# Patient Record
Sex: Male | Born: 1965
Health system: Southern US, Community
[De-identification: ages and names within clinical notes are randomized; demographics above are authoritative.]

## PROBLEM LIST (undated history)

## (undated) DIAGNOSIS — E1165 Type 2 diabetes mellitus with hyperglycemia: Secondary | ICD-10-CM

## (undated) DIAGNOSIS — F209 Schizophrenia, unspecified: Secondary | ICD-10-CM

## (undated) DIAGNOSIS — IMO0002 Reserved for concepts with insufficient information to code with codable children: Secondary | ICD-10-CM

## (undated) DIAGNOSIS — I10 Essential (primary) hypertension: Secondary | ICD-10-CM

## (undated) DIAGNOSIS — E785 Hyperlipidemia, unspecified: Secondary | ICD-10-CM

## (undated) HISTORY — DX: Schizophrenia, unspecified: F20.9

## (undated) HISTORY — DX: Essential (primary) hypertension: I10

## (undated) HISTORY — DX: Hyperlipidemia, unspecified: E78.5

## (undated) HISTORY — DX: Reserved for concepts with insufficient information to code with codable children: IMO0002

## (undated) HISTORY — DX: Type 2 diabetes mellitus with hyperglycemia: E11.65

---

## 2000-06-26 ENCOUNTER — Inpatient Hospital Stay (HOSPITAL_COMMUNITY): Admission: EM | Admit: 2000-06-26 | Discharge: 2000-07-05 | Payer: Self-pay | Admitting: *Deleted

## 2009-10-13 ENCOUNTER — Other Ambulatory Visit: Payer: Self-pay

## 2009-10-14 ENCOUNTER — Other Ambulatory Visit: Payer: Self-pay | Admitting: Emergency Medicine

## 2009-10-14 ENCOUNTER — Inpatient Hospital Stay (HOSPITAL_COMMUNITY): Admission: AD | Admit: 2009-10-14 | Discharge: 2009-10-17 | Payer: Self-pay | Admitting: Psychiatry

## 2009-10-14 ENCOUNTER — Ambulatory Visit: Payer: Self-pay | Admitting: Psychiatry

## 2009-10-14 ENCOUNTER — Other Ambulatory Visit: Payer: Self-pay

## 2010-05-29 LAB — BASIC METABOLIC PANEL
BUN: 15 mg/dL (ref 6–23)
BUN: 7 mg/dL (ref 6–23)
Creatinine, Ser: 1.84 mg/dL — ABNORMAL HIGH (ref 0.4–1.2)
GFR calc Af Amer: 36 mL/min — ABNORMAL LOW (ref >60)
GFR calc Af Amer: 37 mL/min — ABNORMAL LOW (ref >60)
GFR calc non Af Amer: 30 mL/min — ABNORMAL LOW (ref >60)
GFR calc non Af Amer: 30 mL/min — ABNORMAL LOW (ref >60)
Potassium: 4.1 mEq/L (ref 3.5–5.1)
Potassium: 3.8 mEq/L (ref 3.5–5.1)

## 2010-05-29 LAB — COMPREHENSIVE METABOLIC PANEL
ALT: 24 U/L (ref 0–35)
BUN: 14 mg/dL (ref 6–23)
CO2: 22 mEq/L (ref 19–32)
Calcium: 9.5 mg/dL (ref 8.4–10.5)
Creatinine, Ser: 1.87 mg/dL — ABNORMAL HIGH (ref 0.4–1.2)
GFR calc non Af Amer: 30 mL/min — ABNORMAL LOW (ref >60)
Glucose, Bld: 100 mg/dL — ABNORMAL HIGH (ref 70–99)
Total Protein: 6.9 g/dL (ref 6.0–8.3)

## 2010-05-29 LAB — URINALYSIS, ROUTINE W REFLEX MICROSCOPIC
Ketones, ur: NEGATIVE mg/dL
Nitrite: NEGATIVE
Protein, ur: NEGATIVE mg/dL
Urobilinogen, UA: 0.2 mg/dL (ref 0.0–1.0)

## 2010-05-29 LAB — RAPID URINE DRUG SCREEN, HOSP PERFORMED
Amphetamines: NOT DETECTED
Barbiturates: NOT DETECTED
Benzodiazepines: NOT DETECTED
Opiates: NOT DETECTED

## 2010-05-29 LAB — DIFFERENTIAL
Basophils Absolute: 0 10*3/uL (ref 0.0–0.1)
Lymphocytes Relative: 19 % (ref 12–46)
Monocytes Relative: 10 % (ref 3–12)

## 2010-05-29 LAB — CBC
HCT: 47.5 % — ABNORMAL HIGH (ref 36.0–46.0)
Platelets: 426 10*3/uL — ABNORMAL HIGH (ref 150–400)
RBC: 5.67 MIL/uL — ABNORMAL HIGH (ref 3.87–5.11)
RDW: 13.4 % (ref 11.5–15.5)
WBC: 14.3 10*3/uL — ABNORMAL HIGH (ref 4.0–10.5)

## 2010-05-29 LAB — SALICYLATE LEVEL: Salicylate Lvl: <4 mg/dL (ref 2.8–20.0)

## 2010-05-29 LAB — TSH: TSH: 0.431 u[IU]/mL (ref 0.350–4.500)

## 2010-05-29 LAB — ETHANOL: Alcohol, Ethyl (B): <5 mg/dL (ref 0–10)

## 2010-07-31 NOTE — Discharge Summary (Signed)
[facility]  Patient:    [name] [name]                 MRN: [id number] Adm. Date:  [id number] Disc. Date: 07/05/00 Attending:  Jasmine Pang CC:         [facility].   Discharge Summary  REASON FOR ADMISSION:  The patient was a 45 year old male who was committed on involuntary papers.  He has had a long history of undifferentiated schizophrenia.  He became very delusional while on a visit from his group home to his parents.  He was acting in a bizarre manner and family felt he was unable to be managed safely in a less restrictive setting.  He is treated at [facility].  For further admission information, see psychiatric admission assessment.  PHYSICAL EXAMINATION:  This was done by [facility]  The patient was found to be healthy with no significant medical problems.  ADMISSION LABORATORY DATA:  CBC was grossly within normal limits except for a slightly elevated WBC count 11.2 (4 to 10.5), slightly increased platelet count 404 (150 to 400).  Routine chemistry panel: Within normal limits. Hypothyroid panel: Within normal limits.  HOSPITAL COURSE:  Upon admission, the patient was continued on his home medications of Zoloft 50 mg q.a.m., Risperdal 3 mg p.o. b.i.d., and Neurontin 300 mg p.o. t.i.d.  On June 28, 2000, Neurontin was increased to 600 mg p.o. t.i.d.  His parents suspect that he was not being compliant with his medications prior to admission.  He states he was taking them but due to his sudden decompensation, it appears he may not have been compliant.  The patient initially stayed in bed much of the time and was delusional.  This improved as his hospitalization progressed.  He was eventually able to get out of bed and attend therapeutic groups and activities.  He visited with his family.  They were very supportive and assisted with his discharge plans.  It was decided that he would  return to the rest home where he resides.  At the time of discharge he was not psychotic.  Delusional ideation had resolved.  He was appropriate and felt able to be managed in a less restrictive setting.  DISCHARGE MENTAL STATUS EXAMINATION:  Casually dressed.  Psychomotor retardation had resolved.  Eye contact had improved.  Speech was still soft and slow.  Mood: Less depressed.  Affect: Wider range, able to smile at appropriate times though still somewhat constricted.  No suicidal or homicidal ideations, no self-injurious behavior or aggression, or psychosis or perceptual disturbance.  Delusions resolved.  Thought processes: Logical and goal directed.  Cognitive: Back to baseline.  Insight: Fair.  Judgment: Fair.  DISCHARGE DIAGNOSES: Axis I:    Schizophrenia, undifferentiated type. Axis II:   None. Axis III:  Healthy. Axis IV:   Severe. Axis V:    Global assessment of functioning current is 45, highest past year            60, admission status 30.  DISCHARGE MEDICATIONS: 1. Zoloft 50 mg q.a.m. 2. Risperdal 3 mg p.o. b.i.d. 3. Neurontin 600 mg p.o. t.i.d.  ACTIVITY LEVEL:  No restrictions.  DIET:  No restrictions.  POST HOSPITAL CARE PLAN:  Return to the rest home that he currently resides in.  Follow-up therapy and medication management at [facility] DD:  07/05/00 TD:  07/05/00 Job: [id number]

## 2010-07-31 NOTE — H&P (Signed)
[facility]  Patient:    [name] [name], [name] [name]                 MRN: [id number] Adm. Date:  [id number] Disc. Date: [id number] Attending:  Jasmine Pang CC:         [facility].   Psychiatric Admission Assessment  DATE OF ADMISSION:  June 26, 2000.  He was on the adult unit.  IDENTIFICATION:  Patient was a 45 year old African-American male admitted on involuntary basis from [facility] due to delusional ideation.  HISTORY OF PRESENT ILLNESS:  Patient has had a long history of schizophrenia, undifferentiated type.  He currently lives in a group home but had been visiting for several days with his parents.  He became very delusional while on this visit and parents noted bizarre behaviors.  He began to talk about being "Gala [place] #[phone]."  He also thought that his mother was an Hospital doctor.  They were familiar with his previous psychotic episodes and did not feel he was currently able to be managed safely in a less restrictive setting than the hospital.  PAST PSYCHIATRIC HISTORY:  Currently he is seen at the [facility].  Medications include Zoloft 50 mg q.a.m., Risperdal 3 mg p.o. b.i.d., Neurontin 300 mg p.o. t.i.d.  PAST MEDICAL HISTORY:  No acute medical problems.  No known drug allergies. He is on no other medications other than those listed above.  SUBSTANCE ABUSE HISTORY:  None.  FAMILY PSYCHIATRIC HISTORY:  None reported.  SOCIAL HISTORY:  Patient currently lives in a rest home.  He states he wants to move out but his parents are insistent that he stay in this setting for now.  When his mental status is stable, he likes the rest home, according to parents.  He is unable to work and is disabled due to his psychiatric problems.  He has no outstanding legal problems.  MENTAL STATUS EXAMINATION:  Patient was a cooperative male with poor eye contact.  He was disheveled with  psychomotor retardation.  Speech was soft and slow.  There was no articulation disorder.  Mood was depressed.  Affect constricted.  There was no suicidal or homicidal ideation.  There was positive psychosis with delusions as per history of present illness.  His thought processes were slowed.  His thought content revolved predominantly around people being imposters, including himself.  On cognitive exam, patient was alert and oriented x 4.  Short term and long term memory were adequate. General fund of knowledge were age and education level appropriately. Attention and concentration diminished.  Insight poor, judgment poor.  ADMISSION DIAGNOSES: Axis I:    Schizophrenia, undifferentiated type. Axis II:   Deferred. Axis III:  Healthy. Axis IV:   Severe. Axis V:    Global assessment of function current 30, highest past year 60.  ASSETS AND STRENGTHS:  Patient is healthy.  He has supportive parents.  He has a supportive living arrangement.  PROBLEMS:  Chronic schizophrenia with recent decompensation and psychosis.  SHORT TERM TREATMENT GOAL:  Resolution of psychosis.  LONG TERM TREATMENT GOAL:  Stabilization of schizophrenia to the point that he is able to function better in all arenas of his life.  INITIAL PLAN OF CARE:  Continue current medications.  It is unclear whether he has been compliant, although he states he has.  Unit therapeutic groups and activities.  Patients family will visit frequently to help with reality testing.  ESTIMATED LENGTH OF INPATIENT  TREATMENT:  Five to seven days.  CONDITION NECESSARY FOR DISCHARGE:  Not psychotic.  POST HOSPITAL CARE PLAN:  Return to the rest home to live.  Follow up therapy and medication management at [facility]. DD:  08/01/00 TD:  08/02/00 Job: [id number]

## 2013-03-21 ENCOUNTER — Telehealth: Payer: Self-pay

## 2013-03-21 NOTE — Telephone Encounter (Signed)
Invalid contact information. New patient

## 2013-03-22 ENCOUNTER — Encounter: Payer: Self-pay | Admitting: Family Medicine

## 2013-03-22 ENCOUNTER — Ambulatory Visit (INDEPENDENT_AMBULATORY_CARE_PROVIDER_SITE_OTHER): Payer: Medicare HMO | Admitting: Family Medicine

## 2013-03-22 VITALS — BP 118/80 | HR 83 | Temp 98.1°F | Ht 70.0 in | Wt 201.4 lb

## 2013-03-22 DIAGNOSIS — J309 Allergic rhinitis, unspecified: Secondary | ICD-10-CM

## 2013-03-22 DIAGNOSIS — Z0000 Encounter for general adult medical examination without abnormal findings: Secondary | ICD-10-CM

## 2013-03-22 DIAGNOSIS — F209 Schizophrenia, unspecified: Secondary | ICD-10-CM

## 2013-03-22 DIAGNOSIS — J302 Other seasonal allergic rhinitis: Secondary | ICD-10-CM

## 2013-03-22 DIAGNOSIS — I10 Essential (primary) hypertension: Secondary | ICD-10-CM

## 2013-03-22 LAB — CBC WITH DIFFERENTIAL/PLATELET
BASOS ABS: 0 10*3/uL (ref 0.0–0.1)
BASOS PCT: 0.4 % (ref 0.0–3.0)
Eosinophils Absolute: 0.2 10*3/uL (ref 0.0–0.7)
Eosinophils Relative: 2.1 % (ref 0.0–5.0)
HEMATOCRIT: 42.1 % (ref 39.0–52.0)
HEMOGLOBIN: 14 g/dL (ref 13.0–17.0)
LYMPHS ABS: 1.6 10*3/uL (ref 0.7–4.0)
LYMPHS PCT: 18.9 % (ref 12.0–46.0)
MCHC: 33.2 g/dL (ref 30.0–36.0)
MCV: 90.1 fl (ref 78.0–100.0)
MONOS PCT: 8 % (ref 3.0–12.0)
Monocytes Absolute: 0.7 10*3/uL (ref 0.1–1.0)
NEUTROS ABS: 6 10*3/uL (ref 1.4–7.7)
Neutrophils Relative %: 70.6 % (ref 43.0–77.0)
Platelets: 515 10*3/uL — ABNORMAL HIGH (ref 150.0–400.0)
RBC: 4.68 Mil/uL (ref 4.22–5.81)
RDW: 15.3 % — AB (ref 11.5–14.6)
WBC: 8.5 10*3/uL (ref 4.5–10.5)

## 2013-03-22 LAB — HEPATIC FUNCTION PANEL
ALBUMIN: 4.1 g/dL (ref 3.5–5.2)
ALK PHOS: 57 U/L (ref 39–117)
ALT: 31 U/L (ref 0–53)
AST: 28 U/L (ref 0–37)
Bilirubin, Direct: 0.1 mg/dL (ref 0.0–0.3)
TOTAL PROTEIN: 6.9 g/dL (ref 6.0–8.3)
Total Bilirubin: 0.5 mg/dL (ref 0.3–1.2)

## 2013-03-22 LAB — BASIC METABOLIC PANEL
BUN: 7 mg/dL (ref 6–23)
CALCIUM: 9.5 mg/dL (ref 8.4–10.5)
CO2: 24 meq/L (ref 19–32)
Chloride: 107 mEq/L (ref 96–112)
Creatinine, Ser: 1.6 mg/dL — ABNORMAL HIGH (ref 0.4–1.5)
GFR: 61.65 mL/min (ref >60.00)
GLUCOSE: 79 mg/dL (ref 70–99)
POTASSIUM: 4.3 meq/L (ref 3.5–5.1)
SODIUM: 140 meq/L (ref 135–145)

## 2013-03-22 LAB — LIPID PANEL
CHOLESTEROL: 226 mg/dL — AB (ref 0–200)
HDL: 34.8 mg/dL — ABNORMAL LOW (ref >39.00)
Total CHOL/HDL Ratio: 6
Triglycerides: 294 mg/dL — ABNORMAL HIGH (ref 0.0–149.0)
VLDL: 58.8 mg/dL — ABNORMAL HIGH (ref 0.0–40.0)

## 2013-03-22 LAB — LDL CHOLESTEROL, DIRECT: LDL DIRECT: 151.8 mg/dL

## 2013-03-22 LAB — TSH: TSH: 1.2 u[IU]/mL (ref 0.35–5.50)

## 2013-03-22 LAB — PSA: PSA: 0.63 ng/mL (ref 0.10–4.00)

## 2013-03-22 NOTE — Progress Notes (Deleted)
Subjective:    [name] Gammel. is a 48 y.o. male who presents for Medicare Annual/Subsequent preventive examination.   Preventive Screening-Counseling & Management  Tobacco History  Smoking status  . Current Every Day Smoker  . Types: Cigarettes, Cigars  Smokeless tobacco  . Not on file    Comment: cigarettes-1 pack every couple of days, cigars-pack a day    Problems Prior to Visit 1.   Current Problems (verified) Patient Active Problem List   Diagnosis Date Noted  . HTN (hypertension) 03/22/2013  . Seasonal allergies 03/22/2013    Medications Prior to Visit No current outpatient prescriptions on file prior to visit.   No current facility-administered medications on file prior to visit.    Current Medications (verified) Current Outpatient Prescriptions  Medication Sig Dispense Refill  . loratadine (CLARITIN) 10 MG tablet Take 10 mg by mouth daily.      . metoprolol tartrate (LOPRESSOR) 25 MG tablet Take 25 mg by mouth 2 (two) times daily.      . mirtazapine (REMERON) 30 MG tablet Take 30 mg by mouth at bedtime.      . risperiDONE (RISPERDAL) 2 MG tablet Take 2 mg by mouth at bedtime.      . risperidone (RISPERDAL) 4 MG tablet Take 4 mg by mouth at bedtime.       No current facility-administered medications for this visit.     Allergies (verified) Review of patient's allergies indicates not on file.   PAST HISTORY  Family History Family History  Problem Relation Age of Onset  . Breast cancer Mother   . Diabetes Mother   . Breast cancer Maternal Aunt   . Prostate cancer Father     StepFather  . Hypertension Father   . Diabetes Maternal Uncle     Social History History  Substance Use Topics  . Smoking status: Current Every Day Smoker    Types: Cigarettes, Cigars  . Smokeless tobacco: Not on file     Comment: cigarettes-1 pack every couple of days, cigars-pack a day  . Alcohol Use: Yes     Comment: Socially    Are there smokers in your home  (other than you)?  No  Risk Factors Current exercise habits: The patient does not participate in regular exercise at present.  Dietary issues discussed: na   Cardiac risk factors: hypertension, male gender and sedentary lifestyle.  Depression Screen (Note: if answer to either of the following is "Yes", a more complete depression screening is indicated)   Q1: Over the past two weeks, have you felt down, depressed or hopeless? No  Q2: Over the past two weeks, have you felt little interest or pleasure in doing things? No  Have you lost interest or pleasure in daily life? No  Do you often feel hopeless? No  Do you cry easily over simple problems? No  Activities of Daily Living In your present state of health, do you have any difficulty performing the following activities?:  Driving? No Managing money?  No Feeding yourself? No Getting from bed to chair? No Climbing a flight of stairs? No Preparing food and eating?: No Bathing or showering? No Getting dressed: No Getting to the toilet? No Using the toilet:No Moving around from place to place: No In the past year have you fallen or had a near fall?:No   Are you sexually active?  No  Do you have more than one partner?  No  Hearing Difficulties: No Do you often ask people to speak up  or repeat themselves? No Do you experience ringing or noises in your ears? No Do you have difficulty understanding soft or whispered voices? No   Do you feel that you have a problem with memory? No  Do you often misplace items? No  Do you feel safe at home?  Yes  Cognitive Testing  Alert? Yes  Normal Appearance?Yes  Oriented to person? Yes  Place? Yes   Time? Yes  Recall of three objects?  Yes  Can perform simple calculations? Yes  Displays appropriate judgment?Yes       List the Names of Other Physician/Practitioners you currently use: 1. Just moved to area. Mom has not established him with oph and dentist   Indicate any recent Medical  Services you may have received from other than [facility] providers in the past year (date may be approximate).   There is no immunization history on file for this patient.  Screening Tests Health Maintenance  Topic Date Due  . Pap Smear  11/16/1985  . Tetanus/tdap  11/17/1986  . Influenza Vaccine  10/13/2012    All answers were reviewed with the patient and necessary referrals were made:  Garnet Koyanagi, DO   03/22/2013   History reviewed:  He  has a past medical history of HTN (hypertension) and Schizophrenia. He  does not have any pertinent problems on file. He  has no past surgical history on file. His family history includes Breast cancer in his maternal aunt and mother; Diabetes in his maternal uncle and mother; Hypertension in his father; Prostate cancer in his father. He  reports that he has been smoking Cigarettes and Cigars.  He has been smoking about 0.00 packs per day. He does not have any smokeless tobacco history on file. He reports that he drinks alcohol. He reports that he does not use illicit drugs. He has a current medication list which includes the following prescription(s): loratadine, metoprolol tartrate, mirtazapine, risperidone, and risperidone. No current outpatient prescriptions on file prior to visit.   No current facility-administered medications on file prior to visit.   He has no allergies on file.  Review of Systems Review of Systems  Constitutional: Negative for activity change, appetite change and fatigue.  HENT: Negative for hearing loss, congestion, tinnitus and ear discharge.  dentist q74m Eyes: Negative for visual disturbance (see optho q1y -- vision corrected to 20/20 with glasses).  Respiratory: Negative for cough, chest tightness and shortness of breath.   Cardiovascular: Negative for chest pain, palpitations and leg swelling.  Gastrointestinal: Negative for abdominal pain, diarrhea, constipation and abdominal distention.  Genitourinary: Negative for  urgency, frequency, decreased urine volume and difficulty urinating.  Musculoskeletal: Negative for back pain, arthralgias and gait problem.  Skin: Negative for color change, pallor and rash.  Neurological: Negative for dizziness, light-headedness, numbness and headaches.  Hematological: Negative for adenopathy. Does not bruise/bleed easily.  Psychiatric/Behavioral: Negative for suicidal ideas, confusion, sleep disturbance, self-injury, dysphoric mood, decreased concentration and agitation.        Objective:     Vision by Snellen chart: opth Blood pressure 118/80, pulse 83, temperature 98.1 F (36.7 C), temperature source Oral, height 5\' 10"  (1.778 m), weight 201 lb 6.4 [name] (91.354 kg), SpO2 98.00%. Body mass index is 28.9 kg/(m^2).  BP 118/80  Pulse 83  Temp(Src) 98.1 F (36.7 C) (Oral)  Ht 5\' 10"  (1.778 m)  Wt 201 lb 6.4 [name] (91.354 kg)  BMI 28.90 kg/m2  SpO2 98% General appearance: alert, cooperative, appears stated age and no distress  Head: Normocephalic, without obvious abnormality, atraumatic Eyes: conjunctivae/corneas clear. PERRL, EOM's intact. Fundi benign. Ears: normal TM's and external ear canals both ears Nose: Nares normal. Septum midline. Mucosa normal. No drainage or sinus tenderness. Throat: lips, mucosa, and tongue normal; teeth and gums normal Neck: no adenopathy, supple, symmetrical, trachea midline and thyroid not enlarged, symmetric, no tenderness/mass/nodules Back: symmetric, no curvature. ROM normal. No CVA tenderness. Lungs: clear to auscultation bilaterally Chest wall: no tenderness Heart: regular rate and rhythm, S1, S2 normal, no murmur, click, rub or gallop Abdomen: soft, non-tender; bowel sounds normal; no masses,  no organomegaly Male genitalia: normal, penis: no lesions or discharge. testes: no masses or tenderness. no hernias Rectal: deferred Extremities: extremities normal, atraumatic, no cyanosis or edema Pulses: 2+ and symmetric Skin: Skin  color, texture, turgor normal. No rashes or lesions Lymph nodes: Cervical, supraclavicular, and axillary nodes normal. Neurologic: Alert and oriented X 3, normal strength and tone. Normal symmetric reflexes. Normal coordination and gait Psych-- + pt has autism      Assessment:     cpe      Plan:     During the course of the visit the patient was educated and counseled about appropriate screening and preventive services including:    Influenza vaccine  Td vaccine  pt mother will bring in imm records  Diet review for nutrition referral? Yes ____  Not Indicated ___x_   Patient Instructions (the written plan) was given to the patient.  Medicare Attestation I have personally reviewed: The patient's medical and social history Their use of alcohol, tobacco or illicit drugs Their current medications and supplements The patient's functional ability including ADLs,fall risks, home safety risks, cognitive, and hearing and visual impairment Diet and physical activities Evidence for depression or mood disorders  The patient's weight, height, BMI, and visual acuity have been recorded in the chart.  I have made referrals, counseling, and provided education to the patient based on review of the above and I have provided the patient with a written personalized care plan for preventive services.     Garnet Koyanagi, DO   03/22/2013

## 2013-03-22 NOTE — Progress Notes (Signed)
Subjective:    [name] [name]. is a 48 y.o. male who presents for Medicare Annual/Subsequent preventive examination.   Preventive Screening-Counseling & Management  Tobacco History  Smoking status  . Current Every Day Smoker  . Types: Cigarettes, Cigars  Smokeless tobacco  . Not on file    Comment: cigarettes-1 pack every couple of days, cigars-pack a day    Problems Prior to Visit 1.   Current Problems (verified) Patient Active Problem List   Diagnosis Date Noted  . HTN (hypertension) 03/22/2013  . Seasonal allergies 03/22/2013    Medications Prior to Visit No current outpatient prescriptions on file prior to visit.   No current facility-administered medications on file prior to visit.    Current Medications (verified) Current Outpatient Prescriptions  Medication Sig Dispense Refill  . loratadine (CLARITIN) 10 MG tablet Take 10 mg by mouth daily.      . metoprolol tartrate (LOPRESSOR) 25 MG tablet Take 25 mg by mouth 2 (two) times daily.      . mirtazapine (REMERON) 30 MG tablet Take 30 mg by mouth at bedtime.      . risperiDONE (RISPERDAL) 2 MG tablet Take 2 mg by mouth at bedtime.      . risperidone (RISPERDAL) 4 MG tablet Take 4 mg by mouth at bedtime.       No current facility-administered medications for this visit.     Allergies (verified) Review of patient's allergies indicates not on file.   PAST HISTORY  Family History Family History  Problem Relation Age of Onset  . Breast cancer Mother   . Diabetes Mother   . Breast cancer Maternal Aunt   . Prostate cancer Father     StepFather  . Hypertension Father   . Diabetes Maternal Uncle     Social History History  Substance Use Topics  . Smoking status: Current Every Day Smoker    Types: Cigarettes, Cigars  . Smokeless tobacco: Not on file     Comment: cigarettes-1 pack every couple of days, cigars-pack a day  . Alcohol Use: Yes     Comment: Socially    Are there smokers in your home  (other than you)?  No  Risk Factors Current exercise habits: The patient does not participate in regular exercise at present.  Dietary issues discussed: na   Cardiac risk factors: hypertension, male gender and sedentary lifestyle.  Depression Screen (Note: if answer to either of the following is "Yes", a more complete depression screening is indicated)   Q1: Over the past two weeks, have you felt down, depressed or hopeless? No  Q2: Over the past two weeks, have you felt little interest or pleasure in doing things? No  Have you lost interest or pleasure in daily life? No  Do you often feel hopeless? No  Do you cry easily over simple problems? No  Activities of Daily Living In your present state of health, do you have any difficulty performing the following activities?:  Driving? Yes Managing money?  Yes Feeding yourself? No Getting from bed to chair? No Climbing a flight of stairs? No Preparing food and eating?: No Bathing or showering? No Getting dressed: No Getting to the toilet? No Using the toilet:No Moving around from place to place: No In the past year have you fallen or had a near fall?:No   Are you sexually active?  No  Do you have more than one partner?  No  Hearing Difficulties: No Do you often ask people to speak up  or repeat themselves? No Do you experience ringing or noises in your ears? No Do you have difficulty understanding soft or whispered voices? No   Do you feel that you have a problem with memory? No  Do you often misplace items? No  Do you feel safe at home?  Yes  Cognitive Testing  Alert? Yes  Normal Appearance?Yes  Oriented to person? Yes  Place? Yes   Time? Yes  Recall of three objects?  Yes  Can perform simple calculations? Yes  Displays appropriate judgment?Yes  Can read the correct time from a watch face?Yes   Advanced Directives have been discussed with the patient? No   List the Names of Other Physician/Practitioners you currently  use: 1.  Psych- at assisted living  Indicate any recent Medical Services you may have received from other than [facility] providers in the past year (date may be approximate).   There is no immunization history on file for this patient.  Screening Tests Health Maintenance  Topic Date Due  . Pap Smear  11/16/1985  . Tetanus/tdap  11/17/1986  . Influenza Vaccine  10/13/2012    All answers were reviewed with the patient and necessary referrals were made:  Garnet Koyanagi, DO   03/22/2013   History reviewed:  He  has a past medical history of HTN (hypertension) and Schizophrenia. He  does not have any pertinent problems on file. He  has no past surgical history on file. His family history includes Breast cancer in his maternal aunt and mother; Diabetes in his maternal uncle and mother; Hypertension in his father; Prostate cancer in his father. He  reports that he has been smoking Cigarettes and Cigars.  He has been smoking about 0.00 packs per day. He does not have any smokeless tobacco history on file. He reports that he drinks alcohol. He reports that he does not use illicit drugs. He has a current medication list which includes the following prescription(s): loratadine, metoprolol tartrate, mirtazapine, risperidone, and risperidone. No current outpatient prescriptions on file prior to visit.   No current facility-administered medications on file prior to visit.   He has no allergies on file.  Review of Systems Review of Systems  Constitutional: Negative for activity change, appetite change and fatigue.  HENT: Negative for hearing loss, congestion, tinnitus and ear discharge.  dentist --no Eyes: Negative for visual disturbance (see optho q1y -- vision corrected to 20/20 with glasses).  Respiratory: Negative for cough, chest tightness and shortness of breath.   Cardiovascular: Negative for chest pain, palpitations and leg swelling.  Gastrointestinal: Negative for abdominal pain, diarrhea,  constipation and abdominal distention.  Genitourinary: Negative for urgency, frequency, decreased urine volume and difficulty urinating.  Musculoskeletal: Negative for back pain, arthralgias and gait problem.  Skin: Negative for color change, pallor and rash.  Neurological: Negative for dizziness, light-headedness, numbness and headaches.  Hematological: Negative for adenopathy. Does not bruise/bleed easily.  Psychiatric/Behavioral: Negative for suicidal ideas, confusion, sleep disturbance, self-injury, dysphoric mood, decreased concentration and agitation.        Objective:     Vision by Snellen chart:opth Blood pressure 118/80, pulse 83, temperature 98.1 F (36.7 C), temperature source Oral, height 5\' 10"  (1.778 m), weight 201 lb 6.4 oz (91.354 kg), SpO2 98.00%. Body mass index is 28.9 kg/(m^2).  BP 118/80  Pulse 83  Temp(Src) 98.1 F (36.7 C) (Oral)  Ht 5\' 10"  (1.778 m)  Wt 201 lb 6.4 oz (91.354 kg)  BMI 28.90 kg/m2  SpO2 98% General appearance: alert,  cooperative, appears stated age and no distress Head: Normocephalic, without obvious abnormality, atraumatic Eyes: conjunctivae/corneas clear. PERRL, EOM's intact. Fundi benign. Ears: normal TM's and external ear canals both ears Nose: Nares normal. Septum midline. Mucosa normal. No drainage or sinus tenderness. Throat: lips, mucosa, and tongue normal; teeth and gums normal Neck: no adenopathy, no carotid bruit, no JVD, supple, symmetrical, trachea midline and thyroid not enlarged, symmetric, no tenderness/mass/nodules Back: symmetric, no curvature. ROM normal. No CVA tenderness. Lungs: clear to auscultation bilaterally Chest wall: no tenderness Heart: regular rate and rhythm, S1, S2 normal, no murmur, click, rub or gallop Abdomen: soft, non-tender; bowel sounds normal; no masses,  no organomegaly Male genitalia: normal Rectal: normal tone, normal prostate, no masses or tenderness Extremities: extremities normal,  atraumatic, no cyanosis or edema Pulses: 2+ and symmetric Skin: Skin color, texture, turgor normal. No rashes or lesions Lymph nodes: Cervical, supraclavicular, and axillary nodes normal. Neurologic: Grossly normal-- Psych-- flat affect, hx schizophrenia      Assessment:     cpe      Plan:     During the course of the visit the patient was educated and counseled about appropriate screening and preventive services including:    Influenza vaccine  Td vaccine  Prostate cancer screening  Diabetes screening  Smoking cessation counseling  Diet review for nutrition referral? Yes ____  Not Indicated __x__   Patient Instructions (the written plan) was given to the patient.  Medicare Attestation I have personally reviewed: The patient's medical and social history Their use of alcohol, tobacco or illicit drugs Their current medications and supplements The patient's functional ability including ADLs,fall risks, home safety risks, cognitive, and hearing and visual impairment Diet and physical activities Evidence for depression or mood disorders  The patient's weight, height, BMI, and visual acuity have been recorded in the chart.  I have made referrals, counseling, and provided education to the patient based on review of the above and I have provided the patient with a written personalized care plan for preventive services.     Garnet Koyanagi, DO   03/22/2013

## 2013-03-22 NOTE — Addendum Note (Signed)
Addended by: Rosalita Chessman on: 03/22/2013 04:51 PM   Modules accepted: Miquel Dunn

## 2013-03-22 NOTE — Progress Notes (Deleted)
   Subjective:    Patient ID: [name] [name]., male    DOB: 1966/03/03, 48 y.o.   MRN: [id number]  HPI Pt here for cpe and labs.  Pt has no complaints.  He lives in [facility] facility and sees the psychiatrist there.   Review of Systems Review of Systems  Constitutional: Negative for activity change, appetite change and fatigue.  HENT: Negative for hearing loss, congestion, tinnitus and ear discharge.  dentist --no Eyes: Negative for visual disturbance (see optho q1y -- vision corrected to 20/20 with glasses).  Respiratory: Negative for cough, chest tightness and shortness of breath.   Cardiovascular: Negative for chest pain, palpitations and leg swelling.  Gastrointestinal: Negative for abdominal pain, diarrhea, constipation and abdominal distention.  Genitourinary: Negative for urgency, frequency, decreased urine volume and difficulty urinating.  Musculoskeletal: Negative for back pain, arthralgias and gait problem.  Skin: Negative for color change, pallor and rash.  Neurological: Negative for dizziness, light-headedness, numbness and headaches.  Hematological: Negative for adenopathy. Does not bruise/bleed easily.  Psychiatric/Behavioral: Negative for suicidal ideas, confusion, sleep disturbance, self-injury, dysphoric mood, decreased concentration and agitation. ---+ under tx for schizophrenia  Past Medical History  Diagnosis Date  . HTN (hypertension)   . Schizophrenia    History   Social History  . Marital Status: Single    Spouse Name: N/A    Number of Children: N/A  . Years of Education: N/A   Occupational History  . Not on file.   Social History Main Topics  . Smoking status: Current Every Day Smoker    Types: Cigarettes, Cigars  . Smokeless tobacco: Not on file     Comment: cigarettes-1 pack every couple of days, cigars-pack a day  . Alcohol Use: Yes     Comment: Socially  . Drug Use: No  . Sexual Activity: No   Other Topics Concern  .  Not on file   Social History Narrative  . No narrative on file   Family History  Problem Relation Age of Onset  . Breast cancer Mother   . Diabetes Mother   . Breast cancer Maternal Aunt   . Prostate cancer Father     StepFather  . Diabetes Maternal Uncle    Current outpatient prescriptions:loratadine (CLARITIN) 10 MG tablet, Take 10 mg by mouth daily., Disp: , Rfl: ;  metoprolol tartrate (LOPRESSOR) 25 MG tablet, Take 25 mg by mouth 2 (two) times daily., Disp: , Rfl: ;  mirtazapine (REMERON) 30 MG tablet, Take 30 mg by mouth at bedtime., Disp: , Rfl: ;  risperiDONE (RISPERDAL) 2 MG tablet, Take 2 mg by mouth at bedtime., Disp: , Rfl:  risperidone (RISPERDAL) 4 MG tablet, Take 4 mg by mouth at bedtime., Disp: , Rfl:         Objective:   Physical Exam        Assessment & Plan:

## 2013-03-22 NOTE — Progress Notes (Signed)
Pre visit review using our clinic review tool, if applicable. No additional management support is needed unless otherwise documented below in the visit note. 

## 2013-03-22 NOTE — Assessment & Plan Note (Signed)
Stable , con't meds  

## 2013-03-22 NOTE — Patient Instructions (Signed)
Preventive Care for Adults, Male A healthy lifestyle and preventive care can promote health and wellness. Preventive health guidelines for men include the following key practices:  A routine yearly physical is a good way to check with your caregiver about your health and preventative screening. It is a chance to share any concerns and updates on your health, and to receive a thorough exam.  Visit your dentist for a routine exam and preventative care every 6 months. Brush your teeth twice a day and floss once a day. Good oral hygiene prevents tooth decay and gum disease.  The frequency of eye exams is based on your age, health, family medical history, use of contact lenses, and other factors. Follow your caregiver's recommendations for frequency of eye exams.  Eat a healthy diet. Foods like vegetables, fruits, whole grains, low-fat dairy products, and lean protein foods contain the nutrients you need without too many calories. Decrease your intake of foods high in solid fats, added sugars, and salt. Eat the right amount of calories for you.Get information about a proper diet from your caregiver, if necessary.  Regular physical exercise is one of the most important things you can do for your health. Most adults should get at least 150 minutes of moderate-intensity exercise (any activity that increases your heart rate and causes you to sweat) each week. In addition, most adults need muscle-strengthening exercises on 2 or more days a week.  Maintain a healthy weight. The body mass index (BMI) is a screening tool to identify possible weight problems. It provides an estimate of body fat based on height and weight. Your caregiver can help determine your BMI, and can help you achieve or maintain a healthy weight.For adults 20 years and older:  A BMI below 18.5 is considered underweight.  A BMI of 18.5 to 24.9 is normal.  A BMI of 25 to 29.9 is considered overweight.  A BMI of 30 and above is  considered obese.  Maintain normal blood lipids and cholesterol levels by exercising and minimizing your intake of saturated fat. Eat a balanced diet with plenty of fruit and vegetables. Blood tests for lipids and cholesterol should begin at age 75 and be repeated every 5 years. If your lipid or cholesterol levels are high, you are over 50, or you are a high risk for heart disease, you may need your cholesterol levels checked more frequently.Ongoing high lipid and cholesterol levels should be treated with medicines if diet and exercise are not effective.  If you smoke, find out from your caregiver how to quit. If you do not use tobacco, do not start.  Lung cancer screening is recommended for adults aged 85 80 years who are at high risk for developing lung cancer because of a history of smoking. Yearly low-dose computed tomography (CT) is recommended for people who have at least a 30-pack-year history of smoking and are a current smoker or have quit within the past 15 years. A pack year of smoking is smoking an average of 1 pack of cigarettes a day for 1 year (for example: 1 pack a day for 30 years or 2 packs a day for 15 years). Yearly screening should continue until the smoker has stopped smoking for at least 15 years. Yearly screening should also be stopped for people who develop a health problem that would prevent them from having lung cancer treatment.  If you choose to drink alcohol, do not exceed 2 drinks per day. One drink is considered to be 12 ounces (  355 mL) of beer, 5 ounces (148 mL) of wine, or 1.5 ounces (44 mL) of liquor.  Avoid use of street drugs. Do not share needles with anyone. Ask for help if you need support or instructions about stopping the use of drugs.  High blood pressure causes heart disease and increases the risk of stroke. Your blood pressure should be checked at least every 1 to 2 years. Ongoing high blood pressure should be treated with medicines, if weight loss and  exercise are not effective.  If you are 49 to 46 years old, ask your caregiver if you should take aspirin to prevent heart disease.  Diabetes screening involves taking a blood sample to check your fasting blood sugar level. This should be done once every 3 years, after age 67, if you are within normal weight and without risk factors for diabetes. Testing should be considered at a younger age or be carried out more frequently if you are overweight and have at least 1 risk factor for diabetes.  Colorectal cancer can be detected and often prevented. Most routine colorectal cancer screening begins at the age of 72 and continues through age 25. However, your caregiver may recommend screening at an earlier age if you have risk factors for colon cancer. On a yearly basis, your caregiver may provide home test kits to check for hidden blood in the stool. Use of a small camera at the end of a tube, to directly examine the colon (sigmoidoscopy or colonoscopy), can detect the earliest forms of colorectal cancer. Talk to your caregiver about this at age 23, when routine screening begins. Direct examination of the colon should be repeated every 5 to 10 years through age 10, unless early forms of pre-cancerous polyps or small growths are found.  Hepatitis C blood testing is recommended for all people born from [birth year] through 1965 and any individual with known risks for hepatitis C.  Practice safe sex. Use condoms and avoid high-risk sexual practices to reduce the spread of sexually transmitted infections (STIs). STIs include gonorrhea, chlamydia, syphilis, trichomonas, herpes, HPV, and human immunodeficiency virus (HIV). Herpes, HIV, and HPV are viral illnesses that have no cure. They can result in disability, cancer, and death.  A one-time screening for abdominal aortic aneurysm (AAA) and surgical repair of large AAAs by sound wave imaging (ultrasonography) is recommended for ages 67 to 66 years who are current or  former smokers.  Healthy men should no longer receive prostate-specific antigen (PSA) blood tests as part of routine cancer screening. Consult with your caregiver about prostate cancer screening.  Testicular cancer screening is not recommended for adult males who have no symptoms. Screening includes self-exam, caregiver exam, and other screening tests. Consult with your caregiver about any symptoms you have or any concerns you have about testicular cancer.  Use sunscreen. Apply sunscreen liberally and repeatedly throughout the day. You should seek shade when your shadow is shorter than you. Protect yourself by wearing long sleeves, pants, a wide-brimmed hat, and sunglasses year round, whenever you are outdoors.  Once a month, do a whole body skin exam, using a mirror to look at the skin on your back. Notify your caregiver of new moles, moles that have irregular borders, moles that are larger than a pencil eraser, or moles that have changed in shape or color.  Stay current with required immunizations.  Influenza vaccine. All adults should be immunized every year.  Tetanus, diphtheria, and acellular pertussis (Td, Tdap) vaccine. An adult who has not previously  with required immunizations.  · Influenza vaccine. All adults should be immunized every year.  · Tetanus, diphtheria, and acellular pertussis (Td, Tdap) vaccine. An adult who has not previously received Tdap or who does not know his vaccine status should receive 1 dose of Tdap. This initial dose should be followed by tetanus and diphtheria toxoids (Td) booster doses every 10 years. Adults with an unknown or incomplete history of completing a 3-dose immunization series with Td-containing vaccines should begin or complete a primary immunization series including a Tdap dose. Adults should receive a Td booster every 10 years.  · Varicella vaccine. An adult without evidence of immunity to varicella should receive 2 doses or a second dose if he has previously received 1 dose.  · Human papillomavirus (HPV) vaccine. Males aged 13 21 years who have not received the vaccine previously should receive the 3-dose series. Males aged 22 26 years may be  immunized. Immunization is recommended through the age of 26 years for any male who has sex with males and did not get any or all doses earlier. Immunization is recommended for any person with an immunocompromised condition through the age of 26 years if he did not get any or all doses earlier. During the 3-dose series, the second dose should be obtained 4 8 weeks after the first dose. The third dose should be obtained 24 weeks after the first dose and 16 weeks after the second dose.  · Zoster vaccine. One dose is recommended for adults aged 60 years or older unless certain conditions are present.  · Measles, mumps, and rubella (MMR) vaccine. Adults born before 1957 generally are considered immune to measles and mumps. Adults born in 1957 or later should have 1 or more doses of MMR vaccine unless there is a contraindication to the vaccine or there is laboratory evidence of immunity to each of the three diseases. A routine second dose of MMR vaccine should be obtained at least 28 days after the first dose for students attending postsecondary schools, health care workers, or international travelers. People who received inactivated measles vaccine or an unknown type of measles vaccine during 1963 1967 should receive 2 doses of MMR vaccine. People who received inactivated mumps vaccine or an unknown type of mumps vaccine before 1979 and are at high risk for mumps infection should consider immunization with 2 doses of MMR vaccine. Unvaccinated health care workers born before 1957 who lack laboratory evidence of measles, mumps, or rubella immunity or laboratory confirmation of disease should consider measles and mumps immunization with 2 doses of MMR vaccine or rubella immunization with 1 dose of MMR vaccine.  · Pneumococcal 13-valent conjugate (PCV13) vaccine. When indicated, a person who is uncertain of his immunization history and has no record of immunization should receive the PCV13 vaccine. An adult aged 19 years or  older who has certain medical conditions and has not been previously immunized should receive 1 dose of PCV13 vaccine. This PCV13 should be followed with a dose of pneumococcal polysaccharide (PPSV23) vaccine. The PPSV23 vaccine dose should be obtained at least 8 weeks after the dose of PCV13 vaccine. An adult aged 19 years or older who has certain medical conditions and previously received 1 or more doses of PPSV23 vaccine should receive 1 dose of PCV13. The PCV13 vaccine dose should be obtained 1 or more years after the last PPSV23 vaccine dose.  · Pneumococcal polysaccharide (PPSV23) vaccine. When PCV13 is also indicated, PCV13 should be obtained first. All adults aged 65   years and older should be immunized. An adult younger than age 65 years who has certain medical conditions should be immunized. Any person who resides in a nursing home or long-term care facility should be immunized. An adult smoker should be immunized. People with an immunocompromised condition and certain other conditions should receive both PCV13 and PPSV23 vaccines. People with human immunodeficiency virus (HIV) infection should be immunized as soon as possible after diagnosis. Immunization during chemotherapy or radiation therapy should be avoided. Routine use of PPSV23 vaccine is not recommended for American Indians, [place] Natives, or people younger than 65 years unless there are medical conditions that require PPSV23 vaccine. When indicated, people who have unknown immunization and have no record of immunization should receive PPSV23 vaccine. One-time revaccination 5 years after the first dose of PPSV23 is recommended for people aged 19 64 years who have chronic kidney failure, nephrotic syndrome, asplenia, or immunocompromised conditions. People who received 1 2 doses of PPSV23 before age 65 years should receive another dose of PPSV23 vaccine at age 65 years or later if at least 5 years have passed since the previous dose. Doses of  PPSV23 are not needed for people immunized with PPSV23 at or after age 65 years.  · Meningococcal vaccine. Adults with asplenia or persistent complement component deficiencies should receive 2 doses of quadrivalent meningococcal conjugate (MenACWY-D) vaccine. The doses should be obtained at least 2 months apart. Microbiologists working with certain meningococcal bacteria, military recruits, people at risk during an outbreak, and people who travel to or live in countries with a high rate of meningitis should be immunized. A first-year college student up through age 21 years who is living in a residence hall should receive a dose if he did not receive a dose on or after his 16th birthday. Adults who have certain high-risk conditions should receive one or more doses of vaccine.  · Hepatitis A vaccine. Adults who wish to be protected from this disease, have certain high-risk conditions, work with hepatitis A-infected animals, work in hepatitis A research labs, or travel to or work in countries with a high rate of hepatitis A should be immunized. Adults who were previously unvaccinated and who anticipate close contact with an international adoptee during the first 60 days after arrival in the [place] from a country with a high rate of hepatitis A should be immunized.  · Hepatitis B vaccine. Adults who wish to be protected from this disease, have certain high-risk conditions, may be exposed to blood or other infectious body fluids, are household contacts or sex partners of hepatitis B positive people, are clients or workers in certain care facilities, or travel to or work in countries with a high rate of hepatitis B should be immunized.  · Haemophilus influenzae type b (Hib) vaccine. A previously unvaccinated person with asplenia or sickle cell disease or having a scheduled splenectomy should receive 1 dose of Hib vaccine. Regardless of previous immunization, a recipient of a hematopoietic stem cell transplant  should receive a 3-dose series 6 12 months after his successful transplant. Hib vaccine is not recommended for adults with HIV infection.  Preventive Service / Frequency  Ages 19 to 39  · Blood pressure check.** / Every 1 to 2 years.  · Lipid and cholesterol check.** / Every 5 years beginning at age 20.  · Hepatitis C blood test.** / For any individual with known risks for hepatitis C.  · Skin self-exam. / Monthly.  · Influenza vaccine. / Every year.  ·   need at least 1 dose of MMR if you were born in 1957 or later. You may also need a 2nd dose.  Pneumococcal 13-valent conjugate (PCV13) vaccine.** / Consult your caregiver.  Pneumococcal polysaccharide (PPSV23) vaccine.** / 1 to 2 doses if you smoke cigarettes or if you have certain conditions.  Meningococcal vaccine.** / 1 dose if you are age 30 to 15 years and a Market researcher living in a residence hall, or have one of several medical conditions, you need to get vaccinated against meningococcal disease. You may also need additional booster doses.  Hepatitis A vaccine.** / Consult your caregiver.  Hepatitis B vaccine.** / Consult your caregiver.  Haemophilus influenzae type b (Hib) vaccine.** / Consult your caregiver. Ages 36 to 12  Blood pressure check.** / Every 1 to 2 years.  Lipid and cholesterol check.** / Every 5 years beginning at age 71.  Lung cancer screening. / Every year if you are aged 55 80 years and have a 30-pack-year history of smoking and currently smoke or have quit within the past 15 years. Yearly screening is stopped once you have quit smoking for at least 15 years or develop a health problem that would prevent you from having lung cancer  treatment.  Fecal occult blood test (FOBT) of stool. / Every year beginning at age 75 and continuing until age 60. You may not have to do this test if you get colonoscopy every 10 years.  Flexible sigmoidoscopy** or colonoscopy.** / Every 5 years for a flexible sigmoidoscopy or every 10 years for a colonoscopy beginning at age 18 and continuing until age 14.  Hepatitis C blood test.** / For all people born from [birth year] through 1963 and any individual with known risks for hepatitis C.  Skin self-exam. / Monthly.  Influenza vaccine. / Every year.  Tetanus, diphtheria, and acellular pertussis (Tdap/Td) vaccine.** / Consult your caregiver. 1 dose of Td every 10 years.  Varicella vaccine.** / Consult your caregiver.  Zoster vaccine.** / 1 dose for adults aged 63 years or older.  Measles, mumps, rubella (MMR) vaccine.** / You need at least 1 dose of MMR if you were born in 1957 or later. You may also need a 2nd dose.  Pneumococcal 13-valent conjugate (PCV13) vaccine.** / Consult your caregiver.  Pneumococcal polysaccharide (PPSV23) vaccine.** / 1 to 2 doses if you smoke cigarettes or if you have certain conditions.  Meningococcal vaccine.** / Consult your caregiver.  Hepatitis A vaccine.** / Consult your caregiver.  Hepatitis B vaccine.** / Consult your caregiver.  Haemophilus influenzae type b (Hib) vaccine.** / Consult your caregiver. Ages 94 and over  Blood pressure check.** / Every 1 to 2 years.  Lipid and cholesterol check.**/ Every 5 years beginning at age 69.  Lung cancer screening. / Every year if you are aged 37 80 years and have a 30-pack-year history of smoking and currently smoke or have quit within the past 15 years. Yearly screening is stopped once you have quit smoking for at least 15 years or develop a health problem that would prevent you from having lung cancer treatment.  Fecal occult blood test (FOBT) of stool. / Every year beginning at age 73 and continuing until  age 56. You may not have to do this test if you get colonoscopy every 10 years.  Flexible sigmoidoscopy** or colonoscopy.** / Every 5 years for a flexible sigmoidoscopy or every 10 years for a colonoscopy beginning at age 40 and continuing until age 43.  Hepatitis C blood test.** /  For all people born from [birth year] through 1963 and any individual with known risks for hepatitis C.  Abdominal aortic aneurysm (AAA) screening.** / A one-time screening for ages 68 to 33 years who are current or former smokers.  Skin self-exam. / Monthly.  Influenza vaccine. / Every year.  Tetanus, diphtheria, and acellular pertussis (Tdap/Td) vaccine.** / 1 dose of Td every 10 years.  Varicella vaccine.** / Consult your caregiver.  Zoster vaccine.** / 1 dose for adults aged 61 years or older.  Pneumococcal 13-valent conjugate (PCV13) vaccine.** / Consult your caregiver.  Pneumococcal polysaccharide (PPSV23) vaccine.** / 1 dose for all adults aged 20 years and older.  Meningococcal vaccine.** / Consult your caregiver.  Hepatitis A vaccine.** / Consult your caregiver.  Hepatitis B vaccine.** / Consult your caregiver.  Haemophilus influenzae type b (Hib) vaccine.** / Consult your caregiver. **Family history and personal history of risk and conditions may change your caregiver's recommendations. Document Released: 04/27/2001 Document Revised: 06/26/2012 Document Reviewed: 07/27/2010 [facility] Patient Information 2014 [place], [place].

## 2013-03-23 ENCOUNTER — Telehealth: Payer: Self-pay | Admitting: Family Medicine

## 2013-03-23 MED ORDER — ATORVASTATIN CALCIUM 20 MG PO TABS: 20.0000 mg | ORAL_TABLET | Freq: Every day | ORAL | Status: DC

## 2013-03-23 NOTE — Telephone Encounter (Signed)
Relevant patient education assigned to patient using Emmi. ° °

## 2013-10-02 ENCOUNTER — Other Ambulatory Visit: Payer: Self-pay | Admitting: *Deleted

## 2013-10-02 MED ORDER — ATORVASTATIN CALCIUM 20 MG PO TABS: ORAL_TABLET | ORAL | Status: DC

## 2013-10-02 NOTE — Telephone Encounter (Addendum)
Called pharmacy and a new rx had been sent to them by a Dr. Alfonse Spruce for Lipitor 10mg  on (09/27/13). The new rx was not sent to the pharmacy. //AB/CMA

## 2013-10-02 NOTE — Addendum Note (Signed)
Addended by: Harl Bowie on: 10/02/2013 03:36 PM   Modules accepted: Orders

## 2014-04-20 ENCOUNTER — Encounter (HOSPITAL_COMMUNITY): Payer: Self-pay | Admitting: Emergency Medicine

## 2014-04-20 ENCOUNTER — Emergency Department (HOSPITAL_COMMUNITY)
Admission: EM | Admit: 2014-04-20 | Discharge: 2014-04-20 | Disposition: A | Discharge status: Home / Self Care | Payer: Commercial Managed Care - HMO | Attending: Emergency Medicine | Admitting: Emergency Medicine

## 2014-04-20 DIAGNOSIS — S199XXA Unspecified injury of neck, initial encounter: Secondary | ICD-10-CM | POA: Insufficient documentation

## 2014-04-20 DIAGNOSIS — S8992XA Unspecified injury of left lower leg, initial encounter: Secondary | ICD-10-CM | POA: Diagnosis present

## 2014-04-20 DIAGNOSIS — S3992XA Unspecified injury of lower back, initial encounter: Secondary | ICD-10-CM | POA: Insufficient documentation

## 2014-04-20 DIAGNOSIS — S80212A Abrasion, left knee, initial encounter: Secondary | ICD-10-CM | POA: Insufficient documentation

## 2014-04-20 DIAGNOSIS — F209 Schizophrenia, unspecified: Secondary | ICD-10-CM | POA: Diagnosis not present

## 2014-04-20 DIAGNOSIS — Y9389 Activity, other specified: Secondary | ICD-10-CM | POA: Insufficient documentation

## 2014-04-20 DIAGNOSIS — R000 Tachycardia, unspecified: Secondary | ICD-10-CM | POA: Diagnosis not present

## 2014-04-20 DIAGNOSIS — H1133 Conjunctival hemorrhage, bilateral: Secondary | ICD-10-CM | POA: Diagnosis not present

## 2014-04-20 DIAGNOSIS — Z720 Tobacco use: Secondary | ICD-10-CM | POA: Diagnosis not present

## 2014-04-20 DIAGNOSIS — I10 Essential (primary) hypertension: Secondary | ICD-10-CM | POA: Insufficient documentation

## 2014-04-20 DIAGNOSIS — Y92410 Unspecified street and highway as the place of occurrence of the external cause: Secondary | ICD-10-CM | POA: Insufficient documentation

## 2014-04-20 DIAGNOSIS — Z79899 Other long term (current) drug therapy: Secondary | ICD-10-CM | POA: Insufficient documentation

## 2014-04-20 DIAGNOSIS — Y998 Other external cause status: Secondary | ICD-10-CM | POA: Diagnosis not present

## 2014-04-20 MED ORDER — HYDROCODONE-ACETAMINOPHEN 5-325 MG PO TABS
2.0000 | ORAL_TABLET | Freq: Once | ORAL | Status: DC
  Filled 2014-04-20: qty 2

## 2014-04-20 MED ORDER — HYDROCODONE-ACETAMINOPHEN 5-325 MG PO TABS: 1.0000 | ORAL_TABLET | ORAL | Status: DC | PRN

## 2014-04-20 NOTE — ED Notes (Signed)
Pt ambulated in hall with steady gait.

## 2014-04-20 NOTE — ED Notes (Signed)
C collar removed by Herbie [place] browning, [place]
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
## 2014-04-20 NOTE — Discharge Instructions (Signed)

## 2014-04-20 NOTE — ED Notes (Signed)
Pt removed off of backboard. Dejnies any pain to neck or back

## 2014-08-14 ENCOUNTER — Telehealth: Payer: Self-pay | Admitting: Family Medicine

## 2014-08-14 NOTE — Telephone Encounter (Signed)
Relation to pt: self Call back number: [phone] Pharmacy: [facility], [place] [phone] (Phone) [phone] (Fax         Reason for call:  metoprolol tartrate (LOPRESSOR) 25 MG tablet  loratadine (CLARITIN) 10 MG tablet  risperiDONE (RISPERDAL) 2 MG tablet  risperidone (RISPERDAL) 4 MG tablet [

## 2014-08-15 MED ORDER — METOPROLOL TARTRATE 25 MG PO TABS: 25.0000 mg | ORAL_TABLET | Freq: Two times a day (BID) | ORAL | Status: DC

## 2014-08-15 MED ORDER — RISPERIDONE 4 MG PO TABS: 4.0000 mg | ORAL_TABLET | Freq: Every day | ORAL | Status: DC

## 2014-08-15 MED ORDER — LORATADINE 10 MG PO TABS: 10.0000 mg | ORAL_TABLET | Freq: Every day | ORAL | Status: DC | PRN

## 2014-08-15 MED ORDER — RISPERIDONE 2 MG PO TABS: 2.0000 mg | ORAL_TABLET | Freq: Every day | ORAL | Status: DC

## 2014-08-15 NOTE — Telephone Encounter (Signed)
One month with 1 refill only--  Must keep appointment

## 2014-08-15 NOTE — Telephone Encounter (Signed)
Rx faxed.    KP 

## 2014-08-15 NOTE — Telephone Encounter (Signed)
MD has not filled these med's and the patient hs not been seen since 03/22/2013, he has a pending apt on 09/20/14. Please advise if this refill is appropriate.    KP

## 2014-08-15 NOTE — Telephone Encounter (Signed)
[facility] [address]

## 2014-08-15 NOTE — Addendum Note (Signed)
Addended by: Ewing Schlein on: 08/15/2014 05:11 PM   Modules accepted: Orders

## 2014-08-15 NOTE — Telephone Encounter (Signed)
Rx re-faxed    KP 

## 2014-09-17 MED ORDER — METOPROLOL TARTRATE 25 MG PO TABS: 25.0000 mg | ORAL_TABLET | Freq: Two times a day (BID) | ORAL | Status: DC

## 2014-09-17 MED ORDER — LORATADINE 10 MG PO TABS: 10.0000 mg | ORAL_TABLET | Freq: Every day | ORAL | Status: DC | PRN

## 2014-09-17 MED ORDER — RISPERIDONE 2 MG PO TABS: 2.0000 mg | ORAL_TABLET | Freq: Every day | ORAL | Status: DC

## 2014-09-17 MED ORDER — RISPERIDONE 4 MG PO TABS: 4.0000 mg | ORAL_TABLET | Freq: Every day | ORAL | Status: DC

## 2014-09-17 NOTE — Addendum Note (Signed)
Addended by: Reino Bellis on: 09/17/2014 11:59 AM   Modules accepted: Orders, Medications

## 2014-09-17 NOTE — Telephone Encounter (Signed)
Sent enough pills to cover patient till Appt.

## 2014-09-17 NOTE — Telephone Encounter (Addendum)
Caller name: Richeson,Gwendolyn Relation to pt: mother  Call back number: [phone] Pharmacy:  [facility], [place] [phone] (Phone) [phone] (Fax)         Reason for call:  Mother requesting a few pills to hold pt over until 09/20/14 appointment. Pt is completely out.

## 2014-09-20 ENCOUNTER — Ambulatory Visit: Payer: Medicare HMO | Admitting: Family Medicine

## 2014-09-21 ENCOUNTER — Other Ambulatory Visit: Payer: Self-pay | Admitting: Family Medicine

## 2014-09-23 ENCOUNTER — Ambulatory Visit (INDEPENDENT_AMBULATORY_CARE_PROVIDER_SITE_OTHER): Payer: Medicare HMO | Admitting: Family Medicine

## 2014-09-23 ENCOUNTER — Encounter: Payer: Self-pay | Admitting: Family Medicine

## 2014-09-23 VITALS — BP 128/80 | HR 82 | Temp 97.6°F | Resp 18 | Ht 71.5 in | Wt 192.6 lb

## 2014-09-23 DIAGNOSIS — F209 Schizophrenia, unspecified: Secondary | ICD-10-CM

## 2014-09-23 DIAGNOSIS — J302 Other seasonal allergic rhinitis: Secondary | ICD-10-CM

## 2014-09-23 DIAGNOSIS — N289 Disorder of kidney and ureter, unspecified: Secondary | ICD-10-CM

## 2014-09-23 DIAGNOSIS — E785 Hyperlipidemia, unspecified: Secondary | ICD-10-CM | POA: Diagnosis not present

## 2014-09-23 DIAGNOSIS — I10 Essential (primary) hypertension: Secondary | ICD-10-CM

## 2014-09-23 LAB — CBC WITH DIFFERENTIAL/PLATELET
BASOS ABS: 0 10*3/uL (ref 0.0–0.1)
BASOS PCT: 0.4 % (ref 0.0–3.0)
EOS ABS: 0.2 10*3/uL (ref 0.0–0.7)
Eosinophils Relative: 1.5 % (ref 0.0–5.0)
HCT: 46.8 % (ref 39.0–52.0)
Hemoglobin: 15.6 g/dL (ref 13.0–17.0)
Lymphocytes Relative: 14.4 % (ref 12.0–46.0)
Lymphs Abs: 1.5 10*3/uL (ref 0.7–4.0)
MCHC: 33.2 g/dL (ref 30.0–36.0)
MCV: 90.1 fl (ref 78.0–100.0)
MONO ABS: 0.7 10*3/uL (ref 0.1–1.0)
MONOS PCT: 6.9 % (ref 3.0–12.0)
Neutro Abs: 8.1 10*3/uL — ABNORMAL HIGH (ref 1.4–7.7)
Neutrophils Relative %: 76.8 % (ref 43.0–77.0)
PLATELETS: 519 10*3/uL — AB (ref 150.0–400.0)
RBC: 5.2 Mil/uL (ref 4.22–5.81)
RDW: 14.3 % (ref 11.5–15.5)
WBC: 10.5 10*3/uL (ref 4.0–10.5)

## 2014-09-23 LAB — BASIC METABOLIC PANEL
BUN: 6 mg/dL (ref 6–23)
CHLORIDE: 106 meq/L (ref 96–112)
CO2: 24 mEq/L (ref 19–32)
Calcium: 9.8 mg/dL (ref 8.4–10.5)
Creatinine, Ser: 1.47 mg/dL (ref 0.40–1.50)
GFR: 66.08 mL/min (ref >60.00)
Glucose, Bld: 89 mg/dL (ref 70–99)
POTASSIUM: 4.2 meq/L (ref 3.5–5.1)
SODIUM: 137 meq/L (ref 135–145)

## 2014-09-23 LAB — LIPID PANEL
CHOL/HDL RATIO: 6
CHOLESTEROL: 193 mg/dL (ref 0–200)
HDL: 30.7 mg/dL — ABNORMAL LOW (ref >39.00)
NonHDL: 162.3
Triglycerides: 287 mg/dL — ABNORMAL HIGH (ref 0.0–149.0)
VLDL: 57.4 mg/dL — ABNORMAL HIGH (ref 0.0–40.0)

## 2014-09-23 LAB — HEPATIC FUNCTION PANEL
ALT: 30 U/L (ref 0–53)
AST: 21 U/L (ref 0–37)
Albumin: 4.3 g/dL (ref 3.5–5.2)
Alkaline Phosphatase: 60 U/L (ref 39–117)
Bilirubin, Direct: 0.1 mg/dL (ref 0.0–0.3)
TOTAL PROTEIN: 7.4 g/dL (ref 6.0–8.3)
Total Bilirubin: 0.5 mg/dL (ref 0.2–1.2)

## 2014-09-23 LAB — MICROALBUMIN / CREATININE URINE RATIO
Creatinine,U: 49.1 mg/dL
MICROALB/CREAT RATIO: 1.4 mg/g (ref 0.0–30.0)
Microalb, Ur: <0.7 mg/dL (ref 0.0–1.9)

## 2014-09-23 LAB — LDL CHOLESTEROL, DIRECT: LDL DIRECT: 123 mg/dL

## 2014-09-23 MED ORDER — SIMVASTATIN 20 MG PO TABS: 20.0000 mg | ORAL_TABLET | Freq: Every day | ORAL | Status: DC

## 2014-09-23 MED ORDER — LORATADINE 10 MG PO TABS: 10.0000 mg | ORAL_TABLET | Freq: Every day | ORAL | Status: DC | PRN

## 2014-09-23 MED ORDER — RISPERIDONE 2 MG PO TABS: 2.0000 mg | ORAL_TABLET | Freq: Every day | ORAL | Status: DC

## 2014-09-23 MED ORDER — RISPERIDONE 4 MG PO TABS: 4.0000 mg | ORAL_TABLET | Freq: Every day | ORAL | Status: DC

## 2014-09-23 MED ORDER — METOPROLOL TARTRATE 25 MG PO TABS: 25.0000 mg | ORAL_TABLET | Freq: Two times a day (BID) | ORAL | Status: DC

## 2014-09-23 NOTE — Patient Instructions (Signed)

## 2014-09-23 NOTE — Telephone Encounter (Signed)
Patient is here in the office, please advise if this refill is appropriate.     KP

## 2014-09-23 NOTE — Assessment & Plan Note (Signed)
Pt never went to nephrology---- recheck labs today Unless significant improvement in renal function  Pt will be referred to nephrology.

## 2014-09-23 NOTE — Progress Notes (Signed)
Patient ID: [name] Slovacek., male    DOB: 08/23/1965  Age: 49 y.o. MRN: [id number]    Subjective:  Subjective HPI [name] [name]. presents for f/u cholesterol and kidney function.  Pt is taking zocor, not lipitor.  His mother is with him today.  She is his POA.  Pt has hx schizophenia and has been living at home since his facility closed and his mom said so far things are going well.  They are asking for a psych referral.    Review of Systems  Constitutional: Negative for diaphoresis, appetite change, fatigue and unexpected weight change.  Eyes: Negative for pain, redness and visual disturbance.  Respiratory: Negative for cough, chest tightness, shortness of breath and wheezing.   Cardiovascular: Negative for chest pain, palpitations and leg swelling.  Endocrine: Negative for cold intolerance, heat intolerance, polydipsia, polyphagia and polyuria.  Genitourinary: Negative for dysuria, frequency and difficulty urinating.  Neurological: Negative for dizziness, light-headedness, numbness and headaches.  Psychiatric/Behavioral: Negative for behavioral problems and decreased concentration. The patient is not nervous/anxious.     History Past Medical History  Diagnosis Date  . HTN (hypertension)   . Schizophrenia     He has no past surgical history on file.   His family history includes Breast cancer in his maternal aunt and mother; Diabetes in his maternal uncle and mother; Hypertension in his father; Prostate cancer in his father.He reports that he has been smoking Cigarettes and Cigars.  He does not have any smokeless tobacco history on file. He reports that he drinks alcohol. He reports that he does not use illicit drugs.  Current Outpatient Prescriptions on File Prior to Visit  Medication Sig Dispense Refill  . mirtazapine (REMERON) 30 MG tablet Take 30 mg by mouth at bedtime.    Marland Kitchen OVER THE COUNTER MEDICATION as needed. Cough medicine    . simvastatin (ZOCOR) 20 MG tablet  TAKE 1 TABLET BY MOUTH AT BEDTIME 30 tablet 0   No current facility-administered medications on file prior to visit.     Objective:  Objective Physical Exam  Constitutional: He is oriented to person, place, and time. Vital signs are normal. He appears well-developed and well-nourished. He is sleeping.  HENT:  Head: Normocephalic and atraumatic.  Mouth/Throat: Oropharynx is clear and moist.  Eyes: EOM are normal. Pupils are equal, round, and reactive to light.  Neck: Normal range of motion. Neck supple. No thyromegaly present.  Cardiovascular: Normal rate and regular rhythm.   No murmur heard. Pulmonary/Chest: Effort normal and breath sounds normal. No respiratory distress. He has no wheezes. He has no rales. He exhibits no tenderness.  Musculoskeletal: He exhibits no edema or tenderness.  Neurological: He is alert and oriented to person, place, and time.  Skin: Skin is warm and dry.  Psychiatric: He has a normal mood and affect. His behavior is normal. Judgment and thought content normal.   BP 128/80 mmHg  Pulse 82  Temp(Src) 97.6 F (36.4 C) (Oral)  Resp 18  Ht 5' 11.5" (1.816 m)  Wt 192 lb 9.6 oz (87.363 kg)  BMI 26.49 kg/m2  SpO2 99% Wt Readings from Last 3 Encounters:  09/23/14 192 lb 9.6 oz (87.363 kg)  04/20/14 208 lb (94.348 kg)  03/22/13 201 lb 6.4 oz (91.354 kg)     Lab Results  Component Value Date   WBC 10.5 09/23/2014   HGB 15.6 09/23/2014   HCT 46.8 09/23/2014   PLT 519.0* 09/23/2014   GLUCOSE 89 09/23/2014   CHOL  193 09/23/2014   TRIG 287.0* 09/23/2014   HDL 30.70* 09/23/2014   LDLDIRECT 123.0 09/23/2014   ALT 30 09/23/2014   AST 21 09/23/2014   NA 137 09/23/2014   K 4.2 09/23/2014   CL 106 09/23/2014   CREATININE 1.47 09/23/2014   BUN 6 09/23/2014   CO2 24 09/23/2014   TSH 1.20 03/22/2013   PSA 0.63 03/22/2013   MICROALBUR <0.7 09/23/2014    No results found.   Assessment & Plan:  Plan I have discontinued Mr. Reinders's atorvastatin  and HYDROcodone-acetaminophen. I have also changed his simvastatin. Additionally, I am having him maintain his mirtazapine, OVER THE COUNTER MEDICATION, loratadine, risperiDONE, metoprolol tartrate, and risperidone.  Meds ordered this encounter  Medications  . DISCONTD: simvastatin (ZOCOR) 20 MG tablet    Sig: Take 20 mg by mouth daily.  Marland Kitchen loratadine (CLARITIN) 10 MG tablet    Sig: Take 1 tablet (10 mg total) by mouth daily as needed.    Dispense:  30 tablet    Refill:  5    DISCONTINUE ALL PREVIOUS REFILLS FOR THIS MEDICATION  . risperiDONE (RISPERDAL) 2 MG tablet    Sig: Take 1 tablet (2 mg total) by mouth at bedtime.    Dispense:  30 tablet    Refill:  5    DISCONTINUE ALL PREVIOUS REFILLS FOR THIS MEDICATION  . metoprolol tartrate (LOPRESSOR) 25 MG tablet    Sig: Take 1 tablet (25 mg total) by mouth 2 (two) times daily.    Dispense:  60 tablet    Refill:  5    DISCONTINUE ALL PREVIOUS REFILLS FOR THIS MEDICATION  . risperidone (RISPERDAL) 4 MG tablet    Sig: Take 1 tablet (4 mg total) by mouth daily.    Dispense:  30 tablet    Refill:  5    DISCONTINUE ALL PREVIOUS REFILLS FOR THIS MEDICATION  . simvastatin (ZOCOR) 20 MG tablet    Sig: Take 1 tablet (20 mg total) by mouth daily.    Dispense:  30 tablet    Refill:  5    DISCONTINUE ALL PREVIOUS REFILLS FOR THIS MEDICATION    Problem List Items Addressed This Visit    Seasonal allergies   Relevant Medications   loratadine (CLARITIN) 10 MG tablet   Renal insufficiency    Pt never went to nephrology---- recheck labs today Unless significant improvement in renal function  Pt will be referred to nephrology.        Relevant Orders   Basic metabolic panel   Hyperlipidemia - Primary   Relevant Medications   metoprolol tartrate (LOPRESSOR) 25 MG tablet   simvastatin (ZOCOR) 20 MG tablet   Other Relevant Orders   Hepatic function panel   Lipid panel   Microalbumin / creatinine urine ratio (Completed)   HTN (hypertension)    Relevant Medications   metoprolol tartrate (LOPRESSOR) 25 MG tablet   simvastatin (ZOCOR) 20 MG tablet   Other Relevant Orders   Basic metabolic panel   CBC with Differential/Platelet   Microalbumin / creatinine urine ratio (Completed)    Other Visit Diagnoses    Schizophrenia, unspecified type        Relevant Medications    risperiDONE (RISPERDAL) 2 MG tablet    risperidone (RISPERDAL) 4 MG tablet    Other Relevant Orders    Ambulatory referral to Psychiatry       Follow-up: Return in about 6 months (around 03/26/2015), or if symptoms worsen or fail to improve, for hypertension, hyperlipidemia,  annual exam, fasting.  Garnet Koyanagi, DO

## 2014-09-23 NOTE — Progress Notes (Signed)
Pre visit review using our clinic review tool, if applicable. No additional management support is needed unless otherwise documented below in the visit note. 

## 2014-10-08 DIAGNOSIS — E785 Hyperlipidemia, unspecified: Secondary | ICD-10-CM

## 2014-10-08 MED ORDER — SIMVASTATIN 40 MG PO TABS: 40.0000 mg | ORAL_TABLET | Freq: Every day | ORAL | Status: DC

## 2014-10-18 ENCOUNTER — Telehealth: Payer: Self-pay | Admitting: Family Medicine

## 2014-10-18 DIAGNOSIS — E785 Hyperlipidemia, unspecified: Secondary | ICD-10-CM

## 2014-10-18 MED ORDER — SIMVASTATIN 40 MG PO TABS: 40.0000 mg | ORAL_TABLET | Freq: Every day | ORAL | Status: DC

## 2014-10-18 NOTE — Telephone Encounter (Signed)
Rx faxed.    KP 

## 2014-10-18 NOTE — Telephone Encounter (Signed)
Caller name:Gwen Barwick Relation to ZS:WFUXNATFT Call back number:[phone] [facility]  Reason for call: pt was seen on7/11/16, Dr. Etter Sjogren provided him a rx for simvastatin (ZOCOR) 40 MG tablet, caregiver states she misplaced the rx and can't find it anywhere. Want to know if dr. Etter Sjogren will cal it in directly to the pharmacy

## 2015-01-27 ENCOUNTER — Other Ambulatory Visit: Payer: Self-pay

## 2015-01-27 DIAGNOSIS — J302 Other seasonal allergic rhinitis: Secondary | ICD-10-CM

## 2015-01-27 DIAGNOSIS — I10 Essential (primary) hypertension: Secondary | ICD-10-CM

## 2015-01-27 DIAGNOSIS — E785 Hyperlipidemia, unspecified: Secondary | ICD-10-CM

## 2015-01-27 MED ORDER — METOPROLOL TARTRATE 25 MG PO TABS: 25.0000 mg | ORAL_TABLET | Freq: Two times a day (BID) | ORAL | Status: DC

## 2015-01-27 MED ORDER — SIMVASTATIN 40 MG PO TABS: 40.0000 mg | ORAL_TABLET | Freq: Every day | ORAL | Status: DC

## 2015-01-27 MED ORDER — LORATADINE 10 MG PO TABS: 10.0000 mg | ORAL_TABLET | Freq: Every day | ORAL | Status: DC | PRN

## 2015-03-10 ENCOUNTER — Other Ambulatory Visit: Payer: Self-pay | Admitting: Family Medicine

## 2015-03-11 NOTE — Telephone Encounter (Signed)
Last seen ans filled 09/23/14 #30 with 5   Please advise    KP

## 2015-05-19 ENCOUNTER — Other Ambulatory Visit: Payer: Self-pay | Admitting: Family Medicine

## 2015-05-19 DIAGNOSIS — E785 Hyperlipidemia, unspecified: Secondary | ICD-10-CM

## 2015-05-19 MED ORDER — SIMVASTATIN 40 MG PO TABS
40.0000 mg | ORAL_TABLET | Freq: Every day | ORAL | Status: DC
Start: 2015-05-19 — End: 2015-06-02

## 2015-05-19 NOTE — Telephone Encounter (Signed)
Please schedule the patient a follow up/Fasting     KP

## 2015-05-19 NOTE — Telephone Encounter (Signed)
Scheduled appointment w/mother

## 2015-06-02 ENCOUNTER — Encounter: Payer: Self-pay | Admitting: Family Medicine

## 2015-06-02 ENCOUNTER — Ambulatory Visit (INDEPENDENT_AMBULATORY_CARE_PROVIDER_SITE_OTHER): Payer: Medicare HMO | Admitting: Family Medicine

## 2015-06-02 VITALS — BP 112/64 | HR 90 | Temp 99.0°F | Wt 199.6 lb

## 2015-06-02 DIAGNOSIS — E785 Hyperlipidemia, unspecified: Secondary | ICD-10-CM

## 2015-06-02 DIAGNOSIS — I10 Essential (primary) hypertension: Secondary | ICD-10-CM

## 2015-06-02 DIAGNOSIS — F201 Disorganized schizophrenia: Secondary | ICD-10-CM | POA: Insufficient documentation

## 2015-06-02 DIAGNOSIS — F209 Schizophrenia, unspecified: Secondary | ICD-10-CM | POA: Diagnosis not present

## 2015-06-02 LAB — POCT URINALYSIS DIPSTICK
BILIRUBIN UA: NEGATIVE
GLUCOSE UA: NEGATIVE
Ketones, UA: NEGATIVE
Leukocytes, UA: NEGATIVE
Nitrite, UA: NEGATIVE
Protein, UA: NEGATIVE
RBC UA: NEGATIVE
SPEC GRAV UA: 1.015
Urobilinogen, UA: 0.2
pH, UA: 6

## 2015-06-02 MED ORDER — RISPERIDONE 4 MG PO TABS: ORAL_TABLET | ORAL | Status: DC

## 2015-06-02 MED ORDER — MIRTAZAPINE 30 MG PO TABS: 30.0000 mg | ORAL_TABLET | Freq: Every day | ORAL | Status: DC

## 2015-06-02 MED ORDER — RISPERIDONE 2 MG PO TABS: ORAL_TABLET | ORAL | Status: DC

## 2015-06-02 MED ORDER — METOPROLOL TARTRATE 25 MG PO TABS: 25.0000 mg | ORAL_TABLET | Freq: Two times a day (BID) | ORAL | Status: DC

## 2015-06-02 MED ORDER — SIMVASTATIN 20 MG PO TABS: 20.0000 mg | ORAL_TABLET | Freq: Every day | ORAL | Status: DC

## 2015-06-02 NOTE — Progress Notes (Signed)
Patient ID: [name] Nouri., male    DOB: 09-05-1965  Age: 50 y.o. MRN: [id number]    Subjective:  Subjective HPI [name] [name]. presents for f/u bp and cholesterol.  His mom is with him and c/o inc voices and twitches.    Review of Systems  Constitutional: Negative for diaphoresis, appetite change, fatigue and unexpected weight change.  Eyes: Negative for pain, redness and visual disturbance.  Respiratory: Negative for cough, chest tightness, shortness of breath and wheezing.   Cardiovascular: Negative for chest pain, palpitations and leg swelling.  Endocrine: Negative for cold intolerance, heat intolerance, polydipsia, polyphagia and polyuria.  Genitourinary: Negative for dysuria, frequency and difficulty urinating.  Neurological: Negative for dizziness, light-headedness, numbness and headaches.    History Past Medical History  Diagnosis Date  . HTN (hypertension)   . Schizophrenia [facility])     He has no past surgical history on file.   His family history includes Breast cancer in his maternal aunt and mother; Diabetes in his maternal uncle and mother; Hypertension in his father; Prostate cancer in his father.He reports that he has been smoking Cigarettes and Cigars.  He does not have any smokeless tobacco history on file. He reports that he drinks alcohol. He reports that he does not use illicit drugs.  Current Outpatient Prescriptions on File Prior to Visit  Medication Sig Dispense Refill  . loratadine (CLARITIN) 10 MG tablet Take 1 tablet (10 mg total) by mouth daily as needed. 90 tablet 1   No current facility-administered medications on file prior to visit.     Objective:  Objective Physical Exam  Constitutional: He is oriented to person, place, and time. Vital signs are normal. He appears well-developed and well-nourished. He is sleeping.  HENT:  Head: Normocephalic and atraumatic.  Mouth/Throat: Oropharynx is clear and moist.  Eyes: EOM are normal. Pupils  are equal, round, and reactive to light.  Neck: Normal range of motion. Neck supple. No thyromegaly present.  Cardiovascular: Normal rate and regular rhythm.   No murmur heard. Pulmonary/Chest: Effort normal and breath sounds normal. No respiratory distress. He has no wheezes. He has no rales. He exhibits no tenderness.  Musculoskeletal: He exhibits no edema or tenderness.  Neurological: He is alert and oriented to person, place, and time.  Skin: Skin is warm and dry.  Psychiatric: Judgment and thought content normal. His affect is blunt. His speech is delayed. He is slowed and withdrawn.  Nursing note and vitals reviewed.  BP 112/64 mmHg  Pulse 90  Temp(Src) 99 F (37.2 C) (Oral)  Wt 199 lb 9.6 oz (90.538 kg)  SpO2 98% Wt Readings from Last 3 Encounters:  06/02/15 199 lb 9.6 oz (90.538 kg)  09/23/14 192 lb 9.6 oz (87.363 kg)  04/20/14 208 lb (94.348 kg)     Lab Results  Component Value Date   WBC 10.5 09/23/2014   HGB 15.6 09/23/2014   HCT 46.8 09/23/2014   PLT 519.0* 09/23/2014   GLUCOSE 89 09/23/2014   CHOL 193 09/23/2014   TRIG 287.0* 09/23/2014   HDL 30.70* 09/23/2014   LDLDIRECT 123.0 09/23/2014   ALT 30 09/23/2014   AST 21 09/23/2014   NA 137 09/23/2014   K 4.2 09/23/2014   CL 106 09/23/2014   CREATININE 1.47 09/23/2014   BUN 6 09/23/2014   CO2 24 09/23/2014   TSH 1.20 03/22/2013   PSA 0.63 03/22/2013   MICROALBUR <0.7 09/23/2014    No results found.   Assessment & Plan:  Plan  I have discontinued Mr. Scholle's OVER THE COUNTER MEDICATION and simvastatin. I have also changed his simvastatin and mirtazapine. Additionally, I am having him maintain his loratadine, risperidone, risperiDONE, and metoprolol tartrate.  Meds ordered this encounter  Medications  . DISCONTD: simvastatin (ZOCOR) 20 MG tablet    Sig: Take 20 mg by mouth at bedtime.    Refill:  0  . simvastatin (ZOCOR) 20 MG tablet    Sig: Take 1 tablet (20 mg total) by mouth at bedtime.     Dispense:  90 tablet    Refill:  1  . risperidone (RISPERDAL) 4 MG tablet    Sig: TAKE 1 TABLET (4 MG TOTAL) BY MOUTH DAILY.    Dispense:  90 tablet    Refill:  1  . risperiDONE (RISPERDAL) 2 MG tablet    Sig: TAKE 1 TABLET (2 MG TOTAL) BY MOUTH AT BEDTIME.    Dispense:  90 tablet    Refill:  1  . mirtazapine (REMERON) 30 MG tablet    Sig: Take 1 tablet (30 mg total) by mouth at bedtime.    Dispense:  90 tablet    Refill:  1  . metoprolol tartrate (LOPRESSOR) 25 MG tablet    Sig: Take 1 tablet (25 mg total) by mouth 2 (two) times daily.    Dispense:  180 tablet    Refill:  1    DISCONTINUE ALL PREVIOUS REFILLS FOR THIS MEDICATION    Problem List Items Addressed This Visit      Unprioritized   HTN (hypertension) - Primary   Relevant Medications   simvastatin (ZOCOR) 20 MG tablet   risperiDONE (RISPERDAL) 2 MG tablet   metoprolol tartrate (LOPRESSOR) 25 MG tablet   Other Relevant Orders   Comp Met (CMET)   Lipid panel   POCT urinalysis dipstick (Completed)   TSH   Hyperlipidemia   Relevant Medications   simvastatin (ZOCOR) 20 MG tablet   risperiDONE (RISPERDAL) 2 MG tablet   metoprolol tartrate (LOPRESSOR) 25 MG tablet   Other Relevant Orders   Comp Met (CMET)   Lipid panel   POCT urinalysis dipstick (Completed)   TSH   Schizophrenia ([place])    meds refilled D/w with mom and pt importance of taking meds and getting a new psych      Relevant Medications   risperidone (RISPERDAL) 4 MG tablet   risperiDONE (RISPERDAL) 2 MG tablet   mirtazapine (REMERON) 30 MG tablet   Other Relevant Orders   Comp Met (CMET)   Lipid panel   POCT urinalysis dipstick (Completed)   TSH      Follow-up: Return in about 3 months (around 09/02/2015), or if symptoms worsen or fail to improve, for hypertension, hyperlipidemia.  Garnet Koyanagi, DO

## 2015-06-02 NOTE — Patient Instructions (Signed)

## 2015-06-02 NOTE — Progress Notes (Signed)
Pre visit review using our clinic review tool, if applicable. No additional management support is needed unless otherwise documented below in the visit note. 

## 2015-06-02 NOTE — Assessment & Plan Note (Signed)
meds refilled D/w with mom and pt importance of taking meds and getting a new psych

## 2015-06-03 ENCOUNTER — Telehealth: Payer: Self-pay | Admitting: Family Medicine

## 2015-06-03 LAB — COMPREHENSIVE METABOLIC PANEL
ALBUMIN: 4.3 g/dL (ref 3.5–5.2)
ALK PHOS: 49 U/L (ref 39–117)
ALT: 48 U/L (ref 0–53)
AST: 33 U/L (ref 0–37)
BILIRUBIN TOTAL: 0.5 mg/dL (ref 0.2–1.2)
BUN: 9 mg/dL (ref 6–23)
CALCIUM: 9.5 mg/dL (ref 8.4–10.5)
CO2: 25 mEq/L (ref 19–32)
Chloride: 103 mEq/L (ref 96–112)
Creatinine, Ser: 1.66 mg/dL — ABNORMAL HIGH (ref 0.40–1.50)
GFR: 57.26 mL/min — AB (ref >60.00)
Glucose, Bld: 91 mg/dL (ref 70–99)
POTASSIUM: 4.1 meq/L (ref 3.5–5.1)
Sodium: 136 mEq/L (ref 135–145)
TOTAL PROTEIN: 7.1 g/dL (ref 6.0–8.3)

## 2015-06-03 LAB — LIPID PANEL
CHOLESTEROL: 178 mg/dL (ref 0–200)
HDL: 33.6 mg/dL — AB (ref >39.00)
LDL Cholesterol: 107 mg/dL — ABNORMAL HIGH (ref 0–99)
NonHDL: 143.93
TRIGLYCERIDES: 187 mg/dL — AB (ref 0.0–149.0)
Total CHOL/HDL Ratio: 5
VLDL: 37.4 mg/dL (ref 0.0–40.0)

## 2015-06-03 LAB — TSH: TSH: 1.49 u[IU]/mL (ref 0.35–4.50)

## 2015-06-03 NOTE — Telephone Encounter (Signed)
i did not change anything--- just refilled ---he needs to see psych

## 2015-06-03 NOTE — Telephone Encounter (Signed)
Caller name:Harbin,Gwendolyn Relation to pt: mother  Call back number:[phone] [id number] [place], [place] [phone] (Phone) [phone] (Fax)         Reason for call:  As per patient mother pharmacy in need of clarification regarding risperiDONE (RISPERDAL) 2 MG tablet pharmacy is under the impression MG was increased and clarification regarding frequency.

## 2015-06-03 NOTE — Telephone Encounter (Signed)
Dr.Lowne have you increase his med's?    KP

## 2015-06-04 NOTE — Telephone Encounter (Signed)
Message left to call the office.    KP 

## 2015-06-05 ENCOUNTER — Other Ambulatory Visit: Payer: Self-pay | Admitting: Family Medicine

## 2015-06-05 DIAGNOSIS — F3160 Bipolar disorder, current episode mixed, unspecified: Secondary | ICD-10-CM

## 2015-06-05 NOTE — Telephone Encounter (Signed)
Mother said she has been Therapist, [facility] but no one has returned their call, she wanted to know if you would call and speak with them to get him in.     [place]

## 2015-07-28 ENCOUNTER — Ambulatory Visit (INDEPENDENT_AMBULATORY_CARE_PROVIDER_SITE_OTHER): Payer: Medicare HMO | Admitting: Psychiatry

## 2015-07-28 ENCOUNTER — Encounter (HOSPITAL_COMMUNITY): Payer: Self-pay | Admitting: Psychiatry

## 2015-07-28 VITALS — BP 118/80 | HR 95 | Ht 69.0 in | Wt 210.0 lb

## 2015-07-28 DIAGNOSIS — F200 Paranoid schizophrenia: Secondary | ICD-10-CM

## 2015-07-28 NOTE — Progress Notes (Signed)
[facility] Initial Assessment Note  [name] [name] [id number] 50 y.o.  07/28/2015 11:28 AM  Chief Complaint:  I had a schizophrenia.  I need medication.  I was living in a group home but the facility is closed.  I moved back with my mother  History of Present Illness:  [name] [name] is 50 year old African-American, single, unemployed man who is referred from his primary care physician Dr. Susann [name] for the management of his psychiatric illness.  Patient has schizophrenia paranoid type.  He was seeing by ACT services in [facility] and he was living in a group home however a few months ago the facility is close.  Patient told he is no longer able to see his psychiatrist and needed a new physician.  Patient is taking his psychiatric medication since age 35.  He has at least 2 psychiatric hospitalization due to bizarre and irrational behavior.  Patient is a poor historian and he has difficulty expressing his symptoms.  He is not sure if he hears voices or feeling paranoid but remember when he does not take his medication his behavior becomes very irrational and erratic.  He did not recall his psychiatrist name and organization.  He reported his current medicine helping him and he does not feel sick.  He sleeps good.  Since he moved back and living with his mother he has no problem.  He admitted some time smoking marijuana but denies any drinking alcohol.  He has a very limited social network.  He reported his sleep is good as long he takes his medication.  Patient was notice poor attention, concentration and at times rambling speech.  He has difficulty expressing his symptoms but denies any recent paranoia, hallucination or any suicidal thoughts or homicidal thought.  He denies any severe mood swing or any anger issues.  He denies any feeling of hopelessness or worthlessness.  He has no tremors or shakes.  His primary care physician recently given enough medication for 6 months however  recommended to see psychiatrist.  Patient is currently not seeing any therapist and not interested in any counseling.  He has no tremors, shakes or any EPS.  His appetite is okay.  His vitals are stable.  Patient denies any symptoms of OCD, PTSD, self abusive behavior, mania or anxiety attacks.  He is comfortable taking his Risperdal 6 mg and Remeron 30 mg at bedtime.  Suicidal Ideation: No Plan Formed: No Patient has means to carry out plan: No  Homicidal Ideation: No Plan Formed: No Patient has means to carry out plan: No  Past Psychiatric History/Hospitalization(s): Patient reported history of psychiatric illness since age 69.  He was in [employer] when symptoms started.  He did remember his behavior becomes very irrational and bizarre.  He has at least 2 psychiatric hospitalization.  [facility] and in 2011 at [facility] due to noncompliance with medication.  In the past he has taken Abilify, Lexapro, Neurontin but recently he is been stable on Risperdal 4 mg in the morning and 2 mg at bedtime and Remeron 30 mg at bedtime.  Patient denies any previous history of suicidal attempt.  As per record he has history of paranoia, hallucination and bizarre behavior. Anxiety: No Bipolar Disorder: No Depression: No Mania: No Psychosis: Yes Schizophrenia: Yes Personality Disorder: No Hospitalization for psychiatric illness: Yes History of Electroconvulsive Shock Therapy: No Prior Suicide Attempts: No  Family History; Patient do not recall any family history of psychiatric illness.  Medical History; Patient has hypertension.  His primary care physician is Dr. Hildred [name]  Traumatic brain injury: Patient denies any history of traumatic brain injury.  Education and Work History; Patient finished some college and worked in [employer] for few years.  He is on disability.  Psychosocial History; Patient born and raised in [place].  He is a poor historian.  He do not  provide much detail about his childhood.  He reported not a good relationship with his father and lived most of the life by himself.  He was living in a group home until 5 months ago group home facility closed and patient moved back to live with his mother.  Patient never married and he has no children.  Patient has one sister who lives close by.  Legal History; Patient denies any current legal issues.  History Of Abuse; Patient denies any history of physical or any sexual abuse.  Substance Abuse History; Patient admitted smoking marijuana on and off but denies any drinking alcohol  Review of Systems: Psychiatric: Agitation: No Hallucination: No Depressed Mood: No Insomnia: No Hypersomnia: No Altered Concentration: Yes Feels Worthless: No Grandiose Ideas: No Belief In Special Powers: No New/Increased Substance Abuse: No Compulsions: No  Neurologic: Headache: No Seizure: No Paresthesias: No   Outpatient Encounter Prescriptions as of 07/28/2015  Medication Sig  . loratadine (CLARITIN) 10 MG tablet Take 1 tablet (10 mg total) by mouth daily as needed.  . metoprolol tartrate (LOPRESSOR) 25 MG tablet Take 1 tablet (25 mg total) by mouth 2 (two) times daily.  . mirtazapine (REMERON) 30 MG tablet Take 1 tablet (30 mg total) by mouth at bedtime.  . risperiDONE (RISPERDAL) 2 MG tablet TAKE 1 TABLET (2 MG TOTAL) BY MOUTH AT BEDTIME.  Marland Kitchen risperidone (RISPERDAL) 4 MG tablet TAKE 1 TABLET (4 MG TOTAL) BY MOUTH DAILY.  . simvastatin (ZOCOR) 20 MG tablet Take 1 tablet (20 mg total) by mouth at bedtime.   No facility-administered encounter medications on file as of 07/28/2015.    Recent Results (from the past 2160 hour(s))  Comp Met (CMET)     Status: Abnormal   Collection Time: 06/02/15  4:57 PM  Result Value Ref Range   Sodium 136 135 - 145 mEq/L   Potassium 4.1 3.5 - 5.1 mEq/L   Chloride 103 96 - 112 mEq/L   CO2 25 19 - 32 mEq/L   Glucose, Bld 91 70 - 99 mg/dL   BUN 9 6 - 23 mg/dL    Creatinine, Ser 1.66 (H) 0.40 - 1.50 mg/dL   Total Bilirubin 0.5 0.2 - 1.2 mg/dL   Alkaline Phosphatase 49 39 - 117 U/L   AST 33 0 - 37 U/L   ALT 48 0 - 53 U/L   Total Protein 7.1 6.0 - 8.3 g/dL   Albumin 4.3 3.5 - 5.2 g/dL   Calcium 9.5 8.4 - 10.5 mg/dL   GFR 57.26 (L) >60.00 mL/min  Lipid panel     Status: Abnormal   Collection Time: 06/02/15  4:57 PM  Result Value Ref Range   Cholesterol 178 0 - 200 mg/dL    Comment: ATP III Classification       Desirable:  < 200 mg/dL               Borderline High:  200 - 239 mg/dL          High:  > = 240 mg/dL   Triglycerides 187.0 (H) 0.0 - 149.0 mg/dL  Comment: Normal:  <150 mg/dLBorderline High:  150 - 199 mg/dL   HDL 33.60 (L) >39.00 mg/dL   VLDL 37.4 0.0 - 40.0 mg/dL   LDL Cholesterol 107 (H) 0 - 99 mg/dL   Total CHOL/HDL Ratio 5     Comment:                Men          Women1/2 Average Risk     3.4          3.3Average Risk          5.0          4.42X Average Risk          9.6          7.13X Average Risk          15.0          11.0                       NonHDL 143.93     Comment: NOTE:  Non-HDL goal should be 30 mg/dL higher than patient's LDL goal (i.e. LDL goal of < 70 mg/dL, would have non-HDL goal of < 100 mg/dL)  TSH     Status: None   Collection Time: 06/02/15  4:57 PM  Result Value Ref Range   TSH 1.49 0.35 - 4.50 uIU/mL  POCT urinalysis dipstick     Status: None   Collection Time: 06/02/15  5:09 PM  Result Value Ref Range   Color, UA yellow    Clarity, UA clear    Glucose, UA neg    Bilirubin, UA neg    Ketones, UA neg    Spec Grav, UA 1.015    Blood, UA neg    pH, UA 6.0    Protein, UA neg    Urobilinogen, UA 0.2    Nitrite, UA neg    Leukocytes, UA Negative Negative      Constitutional:  BP 118/80 mmHg  Pulse 95  Ht _0  (1.753 m)  Wt 210 lb (95.255 kg)  BMI 31.00 kg/m2   Musculoskeletal: Strength & Muscle Tone: within normal limits Gait & Station: normal Patient leans: N/A  Psychiatric Specialty  Exam: General Appearance: Disheveled, Fairly Groomed and Poor dental hygiene and long beard  Eye Contact::  Fair  Speech:  Fast and rambling  Volume:  Normal  Mood:  Anxious  Affect:  Constricted and Inappropriate  Thought Process:  Circumstantial  Orientation:  Full (Time, Place, and Person)  Thought Content:  Paranoid Ideation and Rumination  Suicidal Thoughts:  No  Homicidal Thoughts:  No  Memory:  Immediate;   Fair Recent;   Poor Remote;   Poor  Judgement:  Fair  Insight:  Fair  Psychomotor Activity:  Normal  Concentration:  Fair  Recall:  Poor  Fund of Knowledge:  Fair  Language:  Fair  Akathisia:  No  Handed:  Right  AIMS (if indicated):     Assets:  Housing Social Support  ADL's:  Intact  Cognition:  WNL  Sleep:        Established Problem, Stable/Improving (1), New problem, with additional work up planned, Review of Psycho-Social Stressors (1), Review or order clinical lab tests (1), Decision to obtain old records (1), Review and summation of old records (2), New Problem, with no additional work-up planned (3), Review of Medication Regimen & Side Effects (2) and Review of New Medication or Change in  Dosage (2)  Assessment: Axis I: Paranoid schizophrenia  Axis II: Deferred  Axis III:  Past Medical History  Diagnosis Date  . HTN (hypertension)   . Schizophrenia ([place])      Plan:  I review his symptoms, history, current medication and recent blood work results.  Patient is a poor historian.  We will need his previous records.  Patient was seen ACT services but reported he is no longer getting services because facilities close.  We will contact his family member to provide details and consent so we can get more information.  At this time patient is fairly stable on Risperdal 4 mg in the morning and 2 mg at bedtime and Remeron 30 mg at bedtime.  We discussed medication side effects, benefits and long-term prognosis.  At this time patient is not interested in  counseling.  He has enough medication for other 4 months.  I recommended to call us back if he has any question, concern if he feel worsening of the symptom.  I will see him again in 2 months.  Discuss safety plan that anytime having active suicidal thoughts or homicidal thought that he need to call 911 or go to the local emergency room.    ARFEEN,SYED T., MD 07/28/2015

## 2015-10-02 ENCOUNTER — Ambulatory Visit (HOSPITAL_COMMUNITY): Payer: Self-pay | Admitting: Psychiatry

## 2015-11-22 ENCOUNTER — Telehealth: Payer: Self-pay | Admitting: Family Medicine

## 2015-11-22 DIAGNOSIS — F209 Schizophrenia, unspecified: Secondary | ICD-10-CM

## 2015-11-22 DIAGNOSIS — I10 Essential (primary) hypertension: Secondary | ICD-10-CM

## 2015-11-22 DIAGNOSIS — E785 Hyperlipidemia, unspecified: Secondary | ICD-10-CM

## 2015-11-24 NOTE — Telephone Encounter (Signed)
Last seen and filled 06/02/15.  Please advise    KP

## 2015-12-01 MED ORDER — RISPERIDONE 2 MG PO TABS: ORAL_TABLET | ORAL | 0 refills | Status: DC

## 2015-12-01 NOTE — Telephone Encounter (Signed)
We discussed it several times with the pt when he was here--- fill x 1 month And psych needs to fill the psych meds

## 2015-12-01 NOTE — Telephone Encounter (Signed)
Rx faxed and Nicki Reaper is aware.    KP

## 2015-12-01 NOTE — Addendum Note (Signed)
Addended by: Ewing Schlein on: 12/01/2015 01:46 PM   Modules accepted: Orders

## 2015-12-01 NOTE — Telephone Encounter (Signed)
Patient seen psych and they meet this week, he is completely out of medication and psych did not fill the Rx, mother said they thought you were going to continue to write the med's, she did not know that psych was going to take over, she said she does not go in the back with the patient, however she will let him know to have psych fill it, she wanted to know if we would fill a 30 day supply since he is completely out. Please advise   KP

## 2015-12-01 NOTE — Telephone Encounter (Addendum)
Caller Name: Mcbrayer,Gwendolyn Call back number:[phone] Pharmacy: [id number] - [place], [address]  Reason for call:   Checking on the status of risperiDONE (RISPERDAL) 2 MG tablet refill, please advise

## 2015-12-04 ENCOUNTER — Encounter (HOSPITAL_COMMUNITY): Payer: Self-pay | Admitting: Psychiatry

## 2015-12-04 ENCOUNTER — Ambulatory Visit (INDEPENDENT_AMBULATORY_CARE_PROVIDER_SITE_OTHER): Payer: Medicare HMO | Admitting: Psychiatry

## 2015-12-04 DIAGNOSIS — I10 Essential (primary) hypertension: Secondary | ICD-10-CM | POA: Diagnosis not present

## 2015-12-04 DIAGNOSIS — F209 Schizophrenia, unspecified: Secondary | ICD-10-CM | POA: Diagnosis not present

## 2015-12-04 DIAGNOSIS — E785 Hyperlipidemia, unspecified: Secondary | ICD-10-CM | POA: Diagnosis not present

## 2015-12-04 MED ORDER — MIRTAZAPINE 30 MG PO TABS: 30.0000 mg | ORAL_TABLET | Freq: Every day | ORAL | 0 refills | Status: DC

## 2015-12-04 MED ORDER — RISPERIDONE 4 MG PO TABS: 4.0000 mg | ORAL_TABLET | Freq: Every day | ORAL | 0 refills | Status: DC

## 2015-12-04 MED ORDER — RISPERIDONE 2 MG PO TABS: ORAL_TABLET | ORAL | 0 refills | Status: DC

## 2015-12-04 NOTE — Progress Notes (Signed)
[facility] Behavioral [facility] [phone] Progress Note  [name] [name] [id number] 50 y.o.  12/04/2015 10:52 AM  Chief Complaint:  I need medication.  I like my medication.   History of Present Illness:  [name] [name] is 50 year old African-American, single, unemployed man who came for his follow-up appointment.  He was seen first time on May 15 for the management of his psychiatric illness.  Patient is a poor historian .  Today he came with his mother who provided collateral information.  Patient has history of schizophrenia chronic paranoid type.  He was seeing ACT services in [facility] that facility closed and he was getting his medication from his primary care physician.  Patient has history of paranoia, bizarre and irrational behavior.  However since he's taking his medication he feel his symptoms are under control.  He denies any paranoia or any hallucination.  Since the last visit he had not used marijuana but admitted some time having poor sleep.  His mother endorsed some time he talk to himself but he is not agitated or out of control.  He had a limited social network.  He recently seen his primary care physician who has given 1 month supply of Risperdal.  He has no tremors, shakes or any EPS.  Patient is not interested in counseling.  He is guarded and sometimes difficult to express his symptoms.  However he denies any suicidal thoughts or homicidal thought.  His appetite is fair.  He like to continue Risperdal 6 mg and Remeron 30 mg at bedtime.  He has no tremors or shakes.  He does not want any Cogentin.  Patient denies drinking alcohol.  His appetite is fair.  His energy level is fair.  His mother endorsed the since he's taking his medication regularly he's not irrational her angry.  Patient does not remember his previous psychiatrist name and ACT team.  Suicidal Ideation: No Plan Formed: No Patient has means to carry out plan: No  Homicidal Ideation: No Plan Formed: No Patient has means to  carry out plan: No  Past Psychiatric History/Hospitalization(s): Patient reported history of psychiatric illness since age 33.  He was in [employer] when symptoms started.  He remember his behavior became very irrational and bizarre.  He has at least 2 psychiatric hospitalization.  He was admitted [facility] and in 2011 at [facility] due to noncompliance with medication.  In the past he has taken Abilify, Lexapro, Neurontin but recently he is been stable on Risperdal 4 mg in the morning and 2 mg at bedtime and Remeron 30 mg at bedtime.  Patient denies any previous history of suicidal attempt.  As per record he has history of paranoia, hallucination and bizarre behavior. Anxiety: No Bipolar Disorder: No Depression: No Mania: No Psychosis: Yes Schizophrenia: Yes Personality Disorder: No Hospitalization for psychiatric illness: Yes History of Electroconvulsive Shock Therapy: No Prior Suicide Attempts: No  Family History; Patient and his mother denies any family history of psychiatric illness.    Medical History; Patient has hypertension.  His primary care physician is Dr. Hildred Laser  Psychosocial History; Patient born and raised in [place].  He reported not a good relationship with his father and lived most of the life by himself.  He was living in a group home until earlier in 2017 group home facility closed and patient moved back to live with his mother.  Patient never married and he has no children.  Patient has one sister who lives close by.  History  Of Abuse; Patient denies any history of physical or any sexual abuse.  Review of Systems: Psychiatric: Agitation: No Hallucination: No Depressed Mood: No Insomnia: No Hypersomnia: No Altered Concentration: No Feels Worthless: No Grandiose Ideas: No Belief In Special Powers: No New/Increased Substance Abuse: No Compulsions: No  Neurologic: Headache: No Seizure: No Paresthesias: No   Outpatient Encounter  Prescriptions as of 12/04/2015  Medication Sig  . loratadine (CLARITIN) 10 MG tablet Take 1 tablet (10 mg total) by mouth daily as needed.  . metoprolol tartrate (LOPRESSOR) 25 MG tablet Take 1 tablet (25 mg total) by mouth 2 (two) times daily.  . mirtazapine (REMERON) 30 MG tablet Take 1 tablet (30 mg total) by mouth at bedtime.  . risperiDONE (RISPERDAL) 2 MG tablet TAKE 1 TABLET (2 MG TOTAL) BY MOUTH IN AM.  . risperidone (RISPERDAL) 4 MG tablet Take 1 tablet (4 mg total) by mouth at bedtime.  . simvastatin (ZOCOR) 40 MG tablet Take 40 mg by mouth.  . [DISCONTINUED] risperiDONE (RISPERDAL) 2 MG tablet TAKE 1 TABLET (2 MG TOTAL) BY MOUTH AT BEDTIME.  . [DISCONTINUED] risperidone (RISPERDAL) 4 MG tablet TAKE 1 TABLET (4 MG TOTAL) BY MOUTH DAILY.  . [DISCONTINUED] simvastatin (ZOCOR) 20 MG tablet Take 1 tablet (20 mg total) by mouth at bedtime.   No facility-administered encounter medications on file as of 12/04/2015.     No results found for this or any previous visit (from the past 2160 hour(s)).    Constitutional:  There were no vitals taken for this visit.   Musculoskeletal: Strength & Muscle Tone: within normal limits Gait & Station: normal Patient leans: N/A  Psychiatric Specialty Exam: General Appearance: Disheveled, Fairly Groomed and Poor dental hygiene and long beard  Eye Contact::  Fair  Speech:  Fast and rambling  Volume:  Normal  Mood:  Anxious  Affect:  Constricted and Inappropriate  Thought Process:  Circumstantial  Orientation:  Full (Time, Place, and Person)  Thought Content:  Rumination  Suicidal Thoughts:  No  Homicidal Thoughts:  No  Memory:  Immediate;   Fair Recent;   Poor Remote;   Poor  Judgement:  Fair  Insight:  Fair  Psychomotor Activity:  Normal  Concentration:  Fair  Recall:  Poor  Fund of Knowledge:  Fair  Language:  Fair  Akathisia:  No  Handed:  Right  AIMS (if indicated):     Assets:  Housing Social Support  ADL's:  Intact   Cognition:  WNL  Sleep:        Established Problem, Stable/Improving (1), Review of Psycho-Social Stressors (1), Review or order clinical lab tests (1), Decision to obtain old records (1), Review and summation of old records (2), Review of Last Therapy Session (1) and Review of Medication Regimen & Side Effects (2)  Assessment: Axis I: Paranoid schizophrenia  Axis II: Deferred  Axis III:  Past Medical History:  Diagnosis Date  . HTN (hypertension)   . Schizophrenia ([place])      Plan:  I review his symptoms, Medication, Collateral information from his primary care physician and his mother. Patient is a poor historian.  He does not remember his previous psychiatrist so we cannot get records from them.  His mother also does not remember very well about his past psychiatric treatment in detail.  Patient is comfortable taking Risperdal 4 mg at bedtime and 2 mg in the morning.  He reported no side effects.  He is not interested in counseling.  I will continue Risperdal  4 mg at bedtime and 2 mg in the morning and Remeron 30 mg at bedtime.  Discussed medication side effects and benefits. I recommended to call us back if he has any question, concern if he feel worsening of the symptom.  I will see him again in 3 months.  Discuss safety plan that anytime having active suicidal thoughts or homicidal thought that he need to call 911 or go to the local emergency room.    Alric Geise T., MD 12/04/2015         Patient ID: [name] Lawrence., male   DOB: [birth year], 50 y.o.   MRN: [id number]

## 2015-12-29 ENCOUNTER — Other Ambulatory Visit: Payer: Self-pay | Admitting: Family Medicine

## 2015-12-29 DIAGNOSIS — E785 Hyperlipidemia, unspecified: Secondary | ICD-10-CM

## 2016-01-19 ENCOUNTER — Ambulatory Visit (INDEPENDENT_AMBULATORY_CARE_PROVIDER_SITE_OTHER): Payer: Medicare HMO | Admitting: Family

## 2016-01-19 ENCOUNTER — Encounter: Payer: Self-pay | Admitting: Family

## 2016-01-19 VITALS — BP 136/89 | HR 77 | Temp 98.6°F | Resp 20 | Ht 69.0 in | Wt 242.6 lb

## 2016-01-19 DIAGNOSIS — R631 Polydipsia: Secondary | ICD-10-CM | POA: Diagnosis not present

## 2016-01-19 DIAGNOSIS — Z23 Encounter for immunization: Secondary | ICD-10-CM | POA: Diagnosis not present

## 2016-01-19 DIAGNOSIS — R739 Hyperglycemia, unspecified: Secondary | ICD-10-CM | POA: Diagnosis not present

## 2016-01-19 LAB — BASIC METABOLIC PANEL
BUN: 7 mg/dL (ref 6–23)
CHLORIDE: 102 meq/L (ref 96–112)
CO2: 29 meq/L (ref 19–32)
CREATININE: 1.68 mg/dL — AB (ref 0.40–1.50)
Calcium: 9.3 mg/dL (ref 8.4–10.5)
GFR: 56.32 mL/min — ABNORMAL LOW (ref >60.00)
Glucose, Bld: 113 mg/dL — ABNORMAL HIGH (ref 70–99)
POTASSIUM: 3.7 meq/L (ref 3.5–5.1)
Sodium: 139 mEq/L (ref 135–145)

## 2016-01-19 LAB — HEMOGLOBIN A1C: Hgb A1c MFr Bld: 6.8 % — ABNORMAL HIGH (ref 4.6–6.5)

## 2016-01-19 LAB — GLUCOSE, POCT (MANUAL RESULT ENTRY): POC Glucose: 198 mg/dl — AB (ref 70–99)

## 2016-01-19 NOTE — Addendum Note (Signed)
Addended by: Sharen Counter D on: 01/19/2016 03:47 PM   Modules accepted: Orders

## 2016-01-19 NOTE — Progress Notes (Signed)
Pre visit review using our clinic review tool, if applicable. No additional management support is needed unless otherwise documented below in the visit note. 

## 2016-01-19 NOTE — Patient Instructions (Signed)
Please complete lab work prior to leaving. Work on eliminating sugared beverages and increasing your exercise.

## 2016-01-19 NOTE — Progress Notes (Signed)
Subjective:    Patient ID: [name] [name]., male    DOB: April 17, 1965, 50 y.o.   MRN: [id number]  HPI  [name] [name] is a 50 yr old male with hx of schizophrenia who presents today with c/o polydipsia and  excessive thirst.  He has been thirsty for some time.  He has ben on remeron for 4-5 months.  During this time he has gained >40 pounds.    Mother notes that he is breathing heavier.  + snoring.  Denies daytime somnolence.  He reports that he only eats 1 meal a day but has at least 4 sodas, sweet tea and an energy drink every day.  He does not exercise.    Wt Readings from Last 3 Encounters:  01/19/16 242 lb 9.6 oz (110 kg)  06/02/15 199 lb 9.6 oz (90.5 kg)  09/23/14 192 lb 9.6 oz (87.4 kg)   Sugar is 198 today in the office.   Mother reports that sometimes the patient forgets to flush and urine in the toilet appears pink. Pt denies hematuria.    Review of Systems    see HPI  Past Medical History:  Diagnosis Date  . HTN (hypertension)   . Schizophrenia [name] [name])      Social History   Social History  . Marital status: Single    Spouse name: N/A  . Number of children: N/A  . Years of education: N/A   Occupational History  . Not on file.   Social History Main Topics  . Smoking status: Current Every Day Smoker    Packs/day: 1.00    Years: 30.00    Types: Cigarettes, Cigars  . Smokeless tobacco: Not on file     Comment: cigarettes-1 pack every couple of days, cigars-pack a day  . Alcohol use No     Comment: Socially  . Drug use: No  . Sexual activity: No   Other Topics Concern  . Not on file   Social History Narrative   Lives with mother   unemployed   No children    No past surgical history on file.  Family History  Problem Relation Age of Onset  . Breast cancer Mother   . Diabetes Mother   . Prostate cancer Father     StepFather  . Hypertension Father   . Breast cancer Maternal Aunt   . Diabetes Maternal Uncle     No Known  Allergies  Current Outpatient Prescriptions on File Prior to Visit  Medication Sig Dispense Refill  . loratadine (CLARITIN) 10 MG tablet Take 1 tablet (10 mg total) by mouth daily as needed. 90 tablet 1  . metoprolol tartrate (LOPRESSOR) 25 MG tablet Take 1 tablet (25 mg total) by mouth 2 (two) times daily. 180 tablet 1  . mirtazapine (REMERON) 30 MG tablet Take 1 tablet (30 mg total) by mouth at bedtime. 90 tablet 0  . risperiDONE (RISPERDAL) 2 MG tablet TAKE 1 TABLET (2 MG TOTAL) BY MOUTH IN AM. 90 tablet 0  . risperidone (RISPERDAL) 4 MG tablet Take 1 tablet (4 mg total) by mouth at bedtime. 90 tablet 0  . simvastatin (ZOCOR) 40 MG tablet Take 1 tablet (40 mg total) by mouth daily. Office visit due now 30 tablet 0   No current facility-administered medications on file prior to visit.     BP 136/89 (BP Location: Right Arm, Patient Position: Sitting, Cuff Size: Large)   Pulse 77   Temp 98.6 F (37 C) (Oral)   Resp 20  Ht 5\' 9"  (1.753 m)   Wt 242 lb 9.6 oz (110 kg)   SpO2 98% Comment: RA  BMI 35.83 kg/m    Objective:   Physical Exam  Constitutional: He is oriented to person, place, and time. He appears well-developed and well-nourished. No distress.  HENT:  Head: Normocephalic and atraumatic.  Cardiovascular: Normal rate and regular rhythm.   No murmur heard. Pulmonary/Chest: Effort normal and breath sounds normal. No respiratory distress. He has no wheezes. He has no rales.  Musculoskeletal: He exhibits no edema.  Neurological: He is alert and oriented to person, place, and time.  Skin: Skin is warm and dry.  Psychiatric: He has a normal mood and affect. His behavior is normal. Thought content normal.          Assessment & Plan:  Hyperglycemia-  Obtain A1C, discussed diabetic diet, exercise, avoid all sugared beverages.  25 minute spent with pt today.  >50% of this time was spent counseling patient on diabetic diet, exercise and weight loss.  I will also notify his  psychiatrist re: his weight gain as I believe that remeron is playing a role.  Check UA with micro to assess for microscopic hematuria.  He does smoke and I advised him on need to quit smoking.

## 2016-01-20 LAB — URINALYSIS, ROUTINE W REFLEX MICROSCOPIC
Bilirubin Urine: NEGATIVE
Hgb urine dipstick: NEGATIVE
Ketones, ur: NEGATIVE
Leukocytes, UA: NEGATIVE
NITRITE: NEGATIVE
Total Protein, Urine: NEGATIVE
Urobilinogen, UA: 0.2 (ref 0.0–1.0)
pH: 7 (ref 5.0–8.0)

## 2016-01-21 ENCOUNTER — Telehealth: Payer: Self-pay | Admitting: Family

## 2016-01-21 MED ORDER — ASPIRIN EC 81 MG PO TBEC: 81.0000 mg | DELAYED_RELEASE_TABLET | Freq: Every day | ORAL | Status: DC

## 2016-01-21 NOTE — Telephone Encounter (Signed)
Please contact patient and let him know that I reviewed his lab work.  Lab work confirms diabetes. He should work on diabetic diet as we discussed as well as exercise and weight loss. Add aspirin 81mg  once daily to help prevent heart attack and stroke.

## 2016-01-23 NOTE — Telephone Encounter (Signed)
I spoke with the patient and he voices understanding.

## 2016-01-27 ENCOUNTER — Other Ambulatory Visit: Payer: Self-pay

## 2016-01-27 DIAGNOSIS — E785 Hyperlipidemia, unspecified: Secondary | ICD-10-CM

## 2016-01-27 MED ORDER — SIMVASTATIN 40 MG PO TABS: 40.0000 mg | ORAL_TABLET | Freq: Every day | ORAL | 0 refills | Status: DC

## 2016-02-02 NOTE — Addendum Note (Signed)
Addended by: Sharen Counter D on: 02/02/2016 08:50 AM   Modules accepted: Orders

## 2016-02-26 ENCOUNTER — Encounter: Payer: Self-pay | Admitting: Family Medicine

## 2016-02-26 ENCOUNTER — Ambulatory Visit (INDEPENDENT_AMBULATORY_CARE_PROVIDER_SITE_OTHER): Payer: Medicare HMO | Admitting: Family Medicine

## 2016-02-26 DIAGNOSIS — E1151 Type 2 diabetes mellitus with diabetic peripheral angiopathy without gangrene: Secondary | ICD-10-CM | POA: Diagnosis not present

## 2016-02-26 DIAGNOSIS — E1165 Type 2 diabetes mellitus with hyperglycemia: Secondary | ICD-10-CM

## 2016-02-26 DIAGNOSIS — IMO0002 Reserved for concepts with insufficient information to code with codable children: Secondary | ICD-10-CM

## 2016-02-26 NOTE — Progress Notes (Signed)
Pre visit review using our clinic review tool, if applicable. No additional management support is needed unless otherwise documented below in the visit note. 

## 2016-02-26 NOTE — Patient Instructions (Signed)
Carbohydrate Counting for Diabetes Mellitus, Adult Carbohydrate counting is a method for keeping track of how many carbohydrates you eat. Eating carbohydrates naturally increases the amount of sugar (glucose) in the blood. Counting how many carbohydrates you eat helps keep your blood glucose within normal limits, which helps you manage your diabetes (diabetes mellitus). It is important to know how many carbohydrates you can safely have in each meal. This is different for every person. A diet and nutrition specialist (registered dietitian) can help you make a meal plan and calculate how many carbohydrates you should have at each meal and snack. Carbohydrates are found in the following foods:  Grains, such as breads and cereals.  Dried beans and soy products.  Starchy vegetables, such as potatoes, peas, and corn.  Fruit and fruit juices.  Milk and yogurt.  Sweets and snack foods, such as cake, cookies, candy, chips, and soft drinks. How do I count carbohydrates? There are two ways to count carbohydrates in food. You can use either of the methods or a combination of both. Reading "Nutrition Facts" on packaged food  The "Nutrition Facts" list is included on the labels of almost all packaged foods and beverages in the [place] It includes:  The serving size.  Information about nutrients in each serving, including the grams (g) of carbohydrate per serving. To use the "Nutrition Facts":  Decide how many servings you will have.  Multiply the number of servings by the number of carbohydrates per serving.  The resulting number is the total amount of carbohydrates that you will be having. Learning standard serving sizes of other foods  When you eat foods containing carbohydrates that are not packaged or do not include "Nutrition Facts" on the label, you need to measure the servings in order to count the amount of carbohydrates:  Measure the foods that you will eat with a food scale or measuring  cup, if needed.  Decide how many standard-size servings you will eat.  Multiply the number of servings by 15. Most carbohydrate-rich foods have about 15 g of carbohydrates per serving.  For example, if you eat 8 oz (170 g) of strawberries, you will have eaten 2 servings and 30 g of carbohydrates (2 servings x 15 g = 30 g).  For foods that have more than one food mixed, such as soups and casseroles, you must count the carbohydrates in each food that is included. The following list contains standard serving sizes of common carbohydrate-rich foods. Each of these servings has about 15 g of carbohydrates:   hamburger bun or  English muffin.   oz (15 mL) syrup.   oz (14 g) jelly.  1 slice of bread.  1 six-inch tortilla.  3 oz (85 g) cooked rice or pasta.  4 oz (113 g) cooked dried beans.  4 oz (113 g) starchy vegetable, such as peas, corn, or potatoes.  4 oz (113 g) hot cereal.  4 oz (113 g) mashed potatoes or  of a large baked potato.  4 oz (113 g) canned or frozen fruit.  4 oz (120 mL) fruit juice.  4-6 crackers.  6 chicken nuggets.  6 oz (170 g) unsweetened dry cereal.  6 oz (170 g) plain fat-free yogurt or yogurt sweetened with artificial sweeteners.  8 oz (240 mL) milk.  8 oz (170 g) fresh fruit or one small piece of fruit.  24 oz (680 g) popped popcorn. Example of carbohydrate counting Sample meal  3 oz (85 g) chicken breast.  6 oz (  170 g) brown rice.  4 oz (113 g) corn.  8 oz (240 mL) milk.  8 oz (170 g) strawberries with sugar-free whipped topping. Carbohydrate calculation 1. Identify the foods that contain carbohydrates:  Rice.  Corn.  Milk.  Strawberries. 2. Calculate how many servings you have of each food:  2 servings rice.  1 serving corn.  1 serving milk.  1 serving strawberries. 3. Multiply each number of servings by 15 g:  2 servings rice x 15 g = 30 g.  1 serving corn x 15 g = 15 g.  1 serving milk x 15 g = 15  g.  1 serving strawberries x 15 g = 15 g. 4. Add together all of the amounts to find the total grams of carbohydrates eaten:  30 g + 15 g + 15 g + 15 g = 75 g of carbohydrates total. This information is not intended to replace advice given to you by your health care provider. Make sure you discuss any questions you have with your health care provider. Document Released: 03/01/2005 Document Revised: 09/19/2015 Document Reviewed: 08/13/2015 Elsevier Interactive Patient Education  2017 Elsevier Inc.  

## 2016-02-26 NOTE — Progress Notes (Signed)
Patient ID: [name] Paynter., male    DOB: April 25, 1965  Age: 50 y.o. MRN: [id number]    Subjective:  Subjective  HPI [name] [name]. presents for f/u since being dx with diabetes--- no complaints  Review of Systems  Constitutional: Negative for appetite change, diaphoresis, fatigue and unexpected weight change.  Eyes: Negative for pain, redness and visual disturbance.  Respiratory: Negative for cough, chest tightness, shortness of breath and wheezing.   Cardiovascular: Negative for chest pain, palpitations and leg swelling.  Endocrine: Negative for cold intolerance, heat intolerance, polydipsia, polyphagia and polyuria.  Genitourinary: Negative for difficulty urinating, dysuria and frequency.  Neurological: Negative for dizziness, light-headedness, numbness and headaches.    History Past Medical History:  Diagnosis Date  . Diabetes mellitus type II, uncontrolled ([place])   . HTN (hypertension)   . Hyperlipidemia LDL goal <70   . Schizophrenia [facility])     He has no past surgical history on file.   His family history includes Breast cancer in his maternal aunt and mother; Diabetes in his maternal uncle and mother; Hypertension in his father; Prostate cancer in his father.He reports that he has been smoking Cigarettes and Cigars.  He has a 30.00 pack-year smoking history. He has never used smokeless tobacco. He reports that he does not drink alcohol or use drugs.  Current Outpatient Prescriptions on File Prior to Visit  Medication Sig Dispense Refill  . aspirin EC 81 MG tablet Take 1 tablet (81 mg total) by mouth daily.    Marland Kitchen loratadine (CLARITIN) 10 MG tablet Take 1 tablet (10 mg total) by mouth daily as needed. 90 tablet 1  . metoprolol tartrate (LOPRESSOR) 25 MG tablet Take 1 tablet (25 mg total) by mouth 2 (two) times daily. 180 tablet 1  . mirtazapine (REMERON) 30 MG tablet Take 1 tablet (30 mg total) by mouth at bedtime. 90 tablet 0  . risperiDONE (RISPERDAL) 2 MG tablet  TAKE 1 TABLET (2 MG TOTAL) BY MOUTH IN AM. 90 tablet 0  . risperidone (RISPERDAL) 4 MG tablet Take 1 tablet (4 mg total) by mouth at bedtime. 90 tablet 0  . simvastatin (ZOCOR) 40 MG tablet Take 1 tablet (40 mg total) by mouth daily. Office visit due now 90 tablet 0   No current facility-administered medications on file prior to visit.      Objective:  Objective  Physical Exam  Constitutional: He is oriented to person, place, and time. Vital signs are normal. He appears well-developed and well-nourished. He is sleeping.  HENT:  Head: Normocephalic and atraumatic.  Mouth/Throat: Oropharynx is clear and moist.  Eyes: EOM are normal. Pupils are equal, round, and reactive to light.  Neck: Normal range of motion. Neck supple. No thyromegaly present.  Cardiovascular: Normal rate and regular rhythm.   No murmur heard. Pulmonary/Chest: Effort normal and breath sounds normal. No respiratory distress. He has no wheezes. He has no rales. He exhibits no tenderness.  Musculoskeletal: He exhibits no edema or tenderness.  Neurological: He is alert and oriented to person, place, and time.  Skin: Skin is warm and dry.  Psychiatric: He has a normal mood and affect. His behavior is normal. Judgment and thought content normal.  Nursing note and vitals reviewed. Sensory exam of the foot is normal, tested with the monofilament. Good pulses, no lesions or ulcers, good peripheral pulses. BP 120/84 (BP Location: Right Arm, Patient Position: Sitting, Cuff Size: Large)   Pulse (!) 104   Temp 98.6 F (37 C) (Oral)  Resp 17   Ht 5\' 9"  (1.753 m)   Wt 240 lb 3.2 oz (109 kg)   SpO2 97%   BMI 35.47 kg/m  Wt Readings from Last 3 Encounters:  02/26/16 240 lb 3.2 oz (109 kg)  01/19/16 242 lb 9.6 oz (110 kg)  06/02/15 199 lb 9.6 oz (90.5 kg)     Lab Results  Component Value Date   WBC 10.5 09/23/2014   HGB 15.6 09/23/2014   HCT 46.8 09/23/2014   PLT 519.0 (H) 09/23/2014   GLUCOSE 113 (H) 01/19/2016    CHOL 178 06/02/2015   TRIG 187.0 (H) 06/02/2015   HDL 33.60 (L) 06/02/2015   LDLDIRECT 123.0 09/23/2014   LDLCALC 107 (H) 06/02/2015   ALT 48 06/02/2015   AST 33 06/02/2015   NA 139 01/19/2016   K 3.7 01/19/2016   CL 102 01/19/2016   CREATININE 1.68 (H) 01/19/2016   BUN 7 01/19/2016   CO2 29 01/19/2016   TSH 1.49 06/02/2015   PSA 0.63 03/22/2013   HGBA1C 6.8 (H) 01/19/2016   MICROALBUR <0.7 09/23/2014    No results found.   Assessment & Plan:  Plan  I am having [name] [name] maintain his loratadine, metoprolol tartrate, risperidone, risperiDONE, mirtazapine, aspirin EC, and simvastatin.  No orders of the defined types were placed in this encounter.   Problem List Items Addressed This Visit      Unprioritized   DM (diabetes mellitus) type II uncontrolled, periph vascular disorder ([place])    Pt given a glucometer and taught how to use it RN did DM teaching  Recheck labs 3 months Check BS 2-3 x a day for next few weeks--- call us with readings         Follow-up: Return in about 3 months (around 05/26/2016) for hypertension, hyperlipidemia, diabetes II.  Ann Held, DO

## 2016-02-29 DIAGNOSIS — IMO0002 Reserved for concepts with insufficient information to code with codable children: Secondary | ICD-10-CM | POA: Insufficient documentation

## 2016-02-29 DIAGNOSIS — E1165 Type 2 diabetes mellitus with hyperglycemia: Principal | ICD-10-CM

## 2016-02-29 DIAGNOSIS — E1151 Type 2 diabetes mellitus with diabetic peripheral angiopathy without gangrene: Secondary | ICD-10-CM | POA: Insufficient documentation

## 2016-02-29 NOTE — Assessment & Plan Note (Addendum)
Pt given a glucometer and taught how to use it RN did DM teaching  Recheck labs 3 months Check BS 2-3 x a day for next few weeks--- call us with readings

## 2016-03-01 ENCOUNTER — Other Ambulatory Visit (HOSPITAL_COMMUNITY): Payer: Self-pay | Admitting: Psychiatry

## 2016-03-01 DIAGNOSIS — F209 Schizophrenia, unspecified: Secondary | ICD-10-CM

## 2016-03-03 NOTE — Telephone Encounter (Signed)
Met with Dr. Adele Schilder who approved new 90 day orders for patient's prescribed Remeron 30 mg and Risperdal 4 mg tablets.  Both new orders e-scribed to patient's [facility] on [address] as authorized by Dr. Adele Schilder.  Patient returns 04/08/16.

## 2016-03-04 ENCOUNTER — Ambulatory Visit (HOSPITAL_COMMUNITY): Payer: Self-pay | Admitting: Psychiatry

## 2016-03-29 ENCOUNTER — Other Ambulatory Visit (HOSPITAL_COMMUNITY): Payer: Self-pay | Admitting: Psychiatry

## 2016-03-29 DIAGNOSIS — I10 Essential (primary) hypertension: Secondary | ICD-10-CM

## 2016-03-29 DIAGNOSIS — F209 Schizophrenia, unspecified: Secondary | ICD-10-CM

## 2016-03-29 DIAGNOSIS — E785 Hyperlipidemia, unspecified: Secondary | ICD-10-CM

## 2016-04-04 ENCOUNTER — Ambulatory Visit (HOSPITAL_COMMUNITY)
Admission: RE | Admit: 2016-04-04 | Discharge: 2016-04-04 | Disposition: A | Discharge status: Home / Self Care | Payer: Medicare HMO | Attending: Psychiatry | Admitting: Psychiatry

## 2016-04-04 ENCOUNTER — Emergency Department (HOSPITAL_COMMUNITY)
Admission: EM | Admit: 2016-04-04 | Discharge: 2016-04-05 | Disposition: A | Discharge status: Other Acute Inpatient Hospital | Payer: Medicare HMO | Attending: Emergency Medicine | Admitting: Emergency Medicine

## 2016-04-04 ENCOUNTER — Encounter (HOSPITAL_COMMUNITY): Payer: Self-pay | Admitting: Oncology

## 2016-04-04 DIAGNOSIS — Z79899 Other long term (current) drug therapy: Secondary | ICD-10-CM | POA: Diagnosis not present

## 2016-04-04 DIAGNOSIS — E785 Hyperlipidemia, unspecified: Secondary | ICD-10-CM | POA: Insufficient documentation

## 2016-04-04 DIAGNOSIS — F209 Schizophrenia, unspecified: Secondary | ICD-10-CM | POA: Insufficient documentation

## 2016-04-04 DIAGNOSIS — E119 Type 2 diabetes mellitus without complications: Secondary | ICD-10-CM | POA: Diagnosis not present

## 2016-04-04 DIAGNOSIS — Z8042 Family history of malignant neoplasm of prostate: Secondary | ICD-10-CM | POA: Diagnosis not present

## 2016-04-04 DIAGNOSIS — I10 Essential (primary) hypertension: Secondary | ICD-10-CM | POA: Insufficient documentation

## 2016-04-04 DIAGNOSIS — F17210 Nicotine dependence, cigarettes, uncomplicated: Secondary | ICD-10-CM | POA: Insufficient documentation

## 2016-04-04 DIAGNOSIS — F201 Disorganized schizophrenia: Secondary | ICD-10-CM | POA: Diagnosis present

## 2016-04-04 DIAGNOSIS — Z7982 Long term (current) use of aspirin: Secondary | ICD-10-CM | POA: Insufficient documentation

## 2016-04-04 DIAGNOSIS — Z833 Family history of diabetes mellitus: Secondary | ICD-10-CM | POA: Diagnosis not present

## 2016-04-04 DIAGNOSIS — Z803 Family history of malignant neoplasm of breast: Secondary | ICD-10-CM | POA: Diagnosis not present

## 2016-04-04 DIAGNOSIS — R451 Restlessness and agitation: Secondary | ICD-10-CM | POA: Diagnosis present

## 2016-04-04 LAB — CBC
HEMATOCRIT: 44.9 % (ref 39.0–52.0)
Hemoglobin: 16 g/dL (ref 13.0–17.0)
MCH: 30.2 pg (ref 26.0–34.0)
MCHC: 35.6 g/dL (ref 30.0–36.0)
MCV: 84.9 fL (ref 78.0–100.0)
Platelets: 520 10*3/uL — ABNORMAL HIGH (ref 150–400)
RBC: 5.29 MIL/uL (ref 4.22–5.81)
RDW: 13 % (ref 11.5–15.5)
WBC: 17.2 10*3/uL — ABNORMAL HIGH (ref 4.0–10.5)

## 2016-04-04 LAB — COMPREHENSIVE METABOLIC PANEL
ALK PHOS: 65 U/L (ref 38–126)
ALT: 47 U/L (ref 17–63)
ANION GAP: 11 (ref 5–15)
AST: 39 U/L (ref 15–41)
Albumin: 4.5 g/dL (ref 3.5–5.0)
BILIRUBIN TOTAL: 0.5 mg/dL (ref 0.3–1.2)
BUN: 21 mg/dL — ABNORMAL HIGH (ref 6–20)
CALCIUM: 9.8 mg/dL (ref 8.9–10.3)
CO2: 21 mmol/L — ABNORMAL LOW (ref 22–32)
Chloride: 102 mmol/L (ref 101–111)
Creatinine, Ser: 1.94 mg/dL — ABNORMAL HIGH (ref 0.61–1.24)
GFR, EST AFRICAN AMERICAN: 45 mL/min — AB (ref >60)
GFR, EST NON AFRICAN AMERICAN: 39 mL/min — AB (ref >60)
Glucose, Bld: 161 mg/dL — ABNORMAL HIGH (ref 65–99)
Potassium: 3.5 mmol/L (ref 3.5–5.1)
Sodium: 134 mmol/L — ABNORMAL LOW (ref 135–145)
TOTAL PROTEIN: 8 g/dL (ref 6.5–8.1)

## 2016-04-04 LAB — ETHANOL

## 2016-04-04 LAB — ACETAMINOPHEN LEVEL

## 2016-04-04 LAB — SALICYLATE LEVEL

## 2016-04-04 NOTE — BH Assessment (Signed)
Tele Assessment Note   [name] [name]. is an 51 y.o. male. Pt is actively hallucinating. Pt is not orienting. Pt had a difficult time completing the assessment. Pt's speech was tangential. Pt had flight of ideas. Pt's guardian [name] [name] (Pt's mother) assisted the Pt with the assessment. Per [name] [name] the Pt's behavior has changes within the last 2 weeks. Pt's grooming has decreased, Pt has been "talking to himself," Pt has difficulty sleeping, and Pt has been agitated and irritated. Pt admitted to not taking his medication for Schizophrenia "in awhile." Pt is seen by Dr. Adele Schilder. Pt does not have a therapist. Pt sees Dr. Adele Schilder every 6 months. Pt is prescribed Risperdal. Pt was living in a group home but it closed. Pt is currently residing with his parents. Pt denies abuse and SA.  Writer consulted with [name] Nutting, NP. Per [name] [name] Pt meets inpatient criteria. TTS to seek placement.  Diagnosis:  F20.9 Schizophrenia   Past Medical History:  Past Medical History:  Diagnosis Date  . Diabetes mellitus type II, uncontrolled ([place])   . HTN (hypertension)   . Hyperlipidemia LDL goal <70   . Schizophrenia ([place])     No past surgical history on file.  Family History:  Family History  Problem Relation Age of Onset  . Breast cancer Mother   . Diabetes Mother   . Prostate cancer Father     StepFather  . Hypertension Father   . Breast cancer Maternal Aunt   . Diabetes Maternal Uncle     Social History:  reports that he has been smoking Cigarettes and Cigars.  He has a 30.00 pack-year smoking history. He has never used smokeless tobacco. He reports that he does not drink alcohol or use drugs.  Additional Social History:  Alcohol / Drug Use Pain Medications: Pt denies  Prescriptions: Risperdal Over the Counter: Pt denies History of alcohol / drug use?: No history of alcohol / drug abuse Longest period of sobriety (when/how long): NA  CIWA:   COWS:    PATIENT STRENGTHS:  (choose at least two) Communication skills Supportive family/friends  Allergies: No Known Allergies  Home Medications:  (Not in a hospital admission)  OB/GYN Status:  No LMP for male patient.  General Assessment Data Location of Assessment: [facility] Assessment Services TTS Assessment: In system Is this a Tele or Face-to-Face Assessment?: Tele Assessment Is this an Initial Assessment or a Re-assessment for this encounter?: Initial Assessment Marital status: Single Maiden name: NA Is patient pregnant?: No Pregnancy Status: No Living Arrangements: Parent Can pt return to current living arrangement?: Yes Admission Status: Voluntary Is patient capable of signing voluntary admission?: Yes Referral Source: Self/Family/Friend Insurance type: Medicaid  Medical Screening Exam ([place]) Medical Exam completed: Yes (completed )  Crisis Care Plan Living Arrangements: Parent Legal Guardian: Mother [name] [name]) Name of Psychiatrist: Dr. Adele Schilder Name of Therapist: NA  Education Status Is patient currently in school?: No Current Grade: NA Highest grade of school patient has completed: 19 Name of school: NA Contact person: NA  Risk to self with the past 6 months Suicidal Ideation: No Has patient been a risk to self within the past 6 months prior to admission? : No Suicidal Intent: No Has patient had any suicidal intent within the past 6 months prior to admission? : No Is patient at risk for suicide?: No Suicidal Plan?: No Has patient had any suicidal plan within the past 6 months prior to admission? : No Access to Means: No What has  been your use of drugs/alcohol within the last 12 months?: NA Previous Attempts/Gestures: No How many times?: 0 Other Self Harm Risks: NA Triggers for Past Attempts: None known Intentional Self Injurious Behavior: None Family Suicide History: No Recent stressful life event(s): Other (Comment) (stop taking medications) Persecutory  voices/beliefs?: No Depression: No Depression Symptoms:  (Pt denies) Substance abuse history and/or treatment for substance abuse?: No Suicide prevention information given to non-admitted patients: Not applicable  Risk to Others within the past 6 months Homicidal Ideation: No Does patient have any lifetime risk of violence toward others beyond the six months prior to admission? : No Thoughts of Harm to Others: No Current Homicidal Intent: No Current Homicidal Plan: No Access to Homicidal Means: No Describe Access to Homicidal Means: NA Identified Victim: NA History of harm to others?: No Assessment of Violence: None Noted Violent Behavior Description: NA Does patient have access to weapons?: No Criminal Charges Pending?: No Does patient have a court date: No Is patient on probation?: No  Psychosis Hallucinations: None noted Delusions: None noted  Mental Status Report Appearance/Hygiene: Disheveled, Body odor, Bizarre Eye Contact: Poor Motor Activity: Freedom of movement Speech: Tangential Level of Consciousness: Alert Mood: Anxious Affect: Anxious Anxiety Level: Minimal Thought Processes: Tangential Judgement: Unimpaired Orientation: Not oriented Obsessive Compulsive Thoughts/Behaviors: None  Cognitive Functioning Concentration: Good Memory: Recent Impaired, Remote Impaired IQ: Average Insight: Poor Impulse Control: Poor Appetite: Poor Weight Loss: 0 Weight Gain: 0 Sleep: Decreased Total Hours of Sleep: 5 Vegetative Symptoms: None  ADLScreening [facility] Assessment Services) Patient's cognitive ability adequate to safely complete daily activities?: No Patient able to express need for assistance with ADLs?: Yes Independently performs ADLs?: Yes (appropriate for developmental age)  Prior Inpatient Therapy Prior Inpatient Therapy: Yes Prior Therapy Dates: 2011 Prior Therapy Facilty/Provider(s): [facility] Reason for Treatment: Schizophrenia  Prior Outpatient  Therapy Prior Outpatient Therapy: Yes Prior Therapy Dates: current Prior Therapy Facilty/Provider(s): Dr. Adele Schilder Reason for Treatment: Schizophrenia Does patient have an ACCT team?: No Does patient have Intensive In-House Services?  : No Does patient have [facility] services? : No Does patient have P4CC services?: No  ADL Screening (condition at time of admission) Patient's cognitive ability adequate to safely complete daily activities?: No Is the patient deaf or have difficulty hearing?: No Does the patient have difficulty concentrating, remembering, or making decisions?: Yes Patient able to express need for assistance with ADLs?: Yes Does the patient have difficulty dressing or bathing?: No Independently performs ADLs?: Yes (appropriate for developmental age) Does the patient have difficulty walking or climbing stairs?: No Weakness of Legs: None Weakness of Arms/Hands: None       Abuse/Neglect Assessment (Assessment to be complete while patient is alone) Physical Abuse: Denies Verbal Abuse: Denies Sexual Abuse: Denies Exploitation of patient/patient's resources: Denies Self-Neglect: Denies     Regulatory affairs officer (For Healthcare) Does Patient Have a Medical Advance Directive?: No    Additional Information 1:1 In Past 12 Months?: No CIRT Risk: No Elopement Risk: No Does patient have medical clearance?: No     Disposition:  Disposition Initial Assessment Completed for this Encounter: Yes Disposition of Patient: Inpatient treatment program Type of inpatient treatment program: Adult  Cyndia Bent 04/04/2016 7:03 PM

## 2016-04-04 NOTE — ED Notes (Signed)
Pt asked twice to change into scrubs. Pt will not do so. Pt is pacing in room and talking to himself.

## 2016-04-04 NOTE — H&P (Signed)
Behavioral Health Medical Screening Exam  Kate Stoneburner. is an 51 y.o. male.  Total Time spent with patient: 30 minutes  Psychiatric Specialty Exam: Physical Exam  Nursing note reviewed. Constitutional: He appears well-developed.  Neurological: He is alert.  Psychiatric: He has a normal mood and affect.    Review of Systems  Psychiatric/Behavioral: Positive for hallucinations and suicidal ideas. The patient is nervous/anxious.     There were no vitals taken for this visit.There is no height or weight on file to calculate BMI.  General Appearance: Disheveled and Guarded  Eye Contact:  Minimal  Speech:  Garbled and Pressured  Volume:  Decreased  Mood:  Anxious and Dysphoric  Affect:  Depressed and Flat  Thought Process:  Disorganized  Orientation:  Other:  person and place  Thought Content:  Hallucinations: Auditory  Suicidal Thoughts:  No  Homicidal Thoughts:  No  Memory:  Immediate;   Poor Recent;   Poor Remote;   Fair  Judgement:  Impaired  Insight:  Shallow  Psychomotor Activity:  Restlessness  Concentration: Concentration: Poor  Recall:  Poor  Fund of Knowledge:Poor  Language: Fair  Akathisia:  No  Handed:  Right  AIMS (if indicated):     Assets:  Communication Skills Desire for Improvement Resilience Social Support  Sleep:       Musculoskeletal: Strength & Muscle Tone: within normal limits Gait & Station: normal Patient leans: N/A  There were no vitals taken for this visit. 148/97 HR 108 RR 18 O2 Sats  100% RM  Recommendations:  Based on my evaluation the patient does not appear to have an emergency medical condition. Patient transported to ED for medical clearance.    Derrill Center, NP 04/04/2016, 6:57 PM

## 2016-04-04 NOTE — ED Notes (Signed)
Pt had TTS assessment complete at 1903 today, see chart notes

## 2016-04-04 NOTE — ED Triage Notes (Signed)
Pt presents from [facility] for medical clearance.  Pt is talking in nonsensical.  Collateral information obtained by mother.  Per pt's mother she does not believe pt has been compliant w/ his medication over the last week.  Per pt's mom pt is exhibiting bizarre behavior.  Pt is disheveled.

## 2016-04-04 NOTE — BHH Counselor (Signed)
Pt has been referred to the following inpt facilities for review: [place], [place], [facility], [place], [place], [place], [place].  Lind Covert, MSW, Latanya Presser

## 2016-04-05 ENCOUNTER — Emergency Department (HOSPITAL_COMMUNITY): Payer: Medicare HMO

## 2016-04-05 DIAGNOSIS — Z803 Family history of malignant neoplasm of breast: Secondary | ICD-10-CM | POA: Diagnosis not present

## 2016-04-05 DIAGNOSIS — Z833 Family history of diabetes mellitus: Secondary | ICD-10-CM | POA: Diagnosis not present

## 2016-04-05 DIAGNOSIS — Z8042 Family history of malignant neoplasm of prostate: Secondary | ICD-10-CM

## 2016-04-05 DIAGNOSIS — F209 Schizophrenia, unspecified: Secondary | ICD-10-CM | POA: Diagnosis not present

## 2016-04-05 DIAGNOSIS — F17210 Nicotine dependence, cigarettes, uncomplicated: Secondary | ICD-10-CM

## 2016-04-05 DIAGNOSIS — Z79899 Other long term (current) drug therapy: Secondary | ICD-10-CM

## 2016-04-05 DIAGNOSIS — Z8249 Family history of ischemic heart disease and other diseases of the circulatory system: Secondary | ICD-10-CM

## 2016-04-05 LAB — CBG MONITORING, ED
GLUCOSE-CAPILLARY: 113 mg/dL — AB (ref 65–99)
GLUCOSE-CAPILLARY: 112 mg/dL — AB (ref 65–99)

## 2016-04-05 MED ORDER — ACETAMINOPHEN 325 MG PO TABS: 650.0000 mg | ORAL_TABLET | ORAL | Status: DC | PRN

## 2016-04-05 MED ORDER — ALUM & MAG HYDROXIDE-SIMETH 200-200-20 MG/5ML PO SUSP
30.0000 mL | ORAL | Status: DC | PRN
Start: 2016-04-05 — End: 2016-04-05

## 2016-04-05 MED ORDER — ZOLPIDEM TARTRATE 5 MG PO TABS: 5.0000 mg | ORAL_TABLET | Freq: Every evening | ORAL | Status: DC | PRN

## 2016-04-05 MED ORDER — RISPERIDONE 2 MG PO TABS
2.0000 mg | ORAL_TABLET | ORAL | Status: DC
  Administered 2016-04-05: 2 mg via ORAL
  Filled 2016-04-05: qty 1

## 2016-04-05 MED ORDER — MIRTAZAPINE 30 MG PO TABS: 30.0000 mg | ORAL_TABLET | Freq: Every day | ORAL | Status: DC

## 2016-04-05 MED ORDER — SIMVASTATIN 40 MG PO TABS
40.0000 mg | ORAL_TABLET | Freq: Every day | ORAL | Status: DC
  Filled 2016-04-05 (×2): qty 1

## 2016-04-05 MED ORDER — IBUPROFEN 200 MG PO TABS: 600.0000 mg | ORAL_TABLET | Freq: Three times a day (TID) | ORAL | Status: DC | PRN

## 2016-04-05 MED ORDER — LORAZEPAM 1 MG PO TABS: 1.0000 mg | ORAL_TABLET | Freq: Three times a day (TID) | ORAL | Status: DC | PRN

## 2016-04-05 MED ORDER — METOPROLOL TARTRATE 25 MG PO TABS
25.0000 mg | ORAL_TABLET | Freq: Two times a day (BID) | ORAL | Status: DC
  Administered 2016-04-05: 25 mg via ORAL
  Filled 2016-04-05: qty 1

## 2016-04-05 MED ORDER — ONDANSETRON HCL 4 MG PO TABS: 4.0000 mg | ORAL_TABLET | Freq: Three times a day (TID) | ORAL | Status: DC | PRN

## 2016-04-05 NOTE — Progress Notes (Signed)
04/05/16 1403:  LRT went to pt room to offer activities, pt was sleep.  Victorino Sparrow, LRT/CTRS

## 2016-04-05 NOTE — ED Notes (Signed)
Pt transported to [place] by [facility] Department by [facility]. All belongings returned to pt who signed for same. Pt continued to be calm and cooperative.

## 2016-04-05 NOTE — ED Notes (Signed)
X-ray tech here to collect pt for x-ray, security called for escort

## 2016-04-05 NOTE — ED Notes (Signed)
SBAR Report received from previous nurse. Pt received calm and visible on unit. Pt denies current SI/ HI, A/V H, depression, anxiety, or pain at this time, and appears otherwise stable and free of distress. Pt was slow to respond to writers questions then answered with 1 word answers as if writer's voice was not the only stimulation in the room. Pt reminded of camera surveillance, q 15 min rounds, and rules of the milieu. Pt provided with sandwich and diet soda, will continue to assess.

## 2016-04-05 NOTE — BH Assessment (Signed)
[place] Assessment Progress Note  Per Corena Pilgrim, MD, this pt requires psychiatric hospitalization at this time.  At 12:36 Caryl Pina calls from [place] to report that pt has been accepted by Dr Dareen Piano to the [facility].  Waylan Boga, DNP, concurs with this decision.  Pt's nurse, Diane, has been notified and agrees to call report to [phone].  Pt is to be transported via [facility], once Findings and Custody Order has been served.  Jalene Mullet, [place] Triage Specialist [phone]

## 2016-04-05 NOTE — Consult Note (Signed)
[facility] Consult   Reason for Consult:  Psychosis  Referring Physician:  EDP Patient Identification: [name] [name]. MRN:  [id number] Principal Diagnosis: Schizophrenia [facility]) Diagnosis:   Patient Active Problem List   Diagnosis Date Noted  . Schizophrenia ([place]) [F20.9] 06/02/2015    Priority: High  . DM (diabetes mellitus) type II uncontrolled, periph vascular disorder ([place]) [E11.51, E11.65] 02/29/2016  . Hyperlipidemia [E78.5] 09/23/2014  . Renal insufficiency [N28.9] 09/23/2014  . HTN (hypertension) [I10] 03/22/2013  . Seasonal allergies [J30.2] 03/22/2013    Total Time spent with patient: 45 minutes  Subjective:   [name] Klaus. is a 51 y.o. male patient admitted with psychosis  HPI:  On admission:  51 y.o. male. Pt is actively hallucinating. Pt is not orienting. Pt had a difficult time completing the assessment. Pt's speech was tangential. Pt had flight of ideas. Pt's guardian [name] [name] (Pt's mother) assisted the Pt with the assessment. Per Mrs. Hjort the Pt's behavior has changes within the last 2 weeks. Pt's grooming has decreased, Pt has been "talking to himself," Pt has difficulty sleeping, and Pt has been agitated and irritated. Pt admitted to not taking his medication for Schizophrenia "in awhile." Pt is seen by Dr. Adele Schilder. Pt does not have a therapist. Pt sees Dr. Adele Schilder every 6 months. Pt is prescribed Risperdal. Pt was living in a group home but it closed. Pt is currently residing with his parents. Pt denies abuse and SA.  Today, he continues to have hallucinations and difficulty processing information.  Less tangential and no agitation noted.  Stabilization needed.  Past Psychiatric History: schizophrenia  Risk to Self: Is patient at risk for suicide?: No, but patient needs Medical Clearance Risk to Others:  No Prior Inpatient Therapy:  Yes Prior Outpatient Therapy:  [facility]  Past Medical History:  Past Medical History:   Diagnosis Date  . Diabetes mellitus type II, uncontrolled ([place])   . HTN (hypertension)   . Hyperlipidemia LDL goal <70   . Schizophrenia ([place])    History reviewed. No pertinent surgical history. Family History:  Family History  Problem Relation Age of Onset  . Breast cancer Mother   . Diabetes Mother   . Prostate cancer Father     StepFather  . Hypertension Father   . Breast cancer Maternal Aunt   . Diabetes Maternal Uncle    Family Psychiatric  History: unknown Social History:  History  Alcohol Use No    Comment: Socially     History  Drug Use No    Social History   Social History  . Marital status: Single    Spouse name: N/A  . Number of children: N/A  . Years of education: N/A   Social History Main Topics  . Smoking status: Current Every Day Smoker    Packs/day: 1.00    Years: 30.00    Types: Cigarettes, Cigars  . Smokeless tobacco: Never Used     Comment: cigarettes-1 pack every couple of days, cigars-pack a day  . Alcohol use No     Comment: Socially  . Drug use: No  . Sexual activity: No   Other Topics Concern  . None   Social History Narrative   Lives with mother   unemployed   No children   Additional Social History:    Allergies:  No Known Allergies  Labs:  Results for orders placed or performed during the hospital encounter of 04/04/16 (from the past 48 hour(s))  Comprehensive metabolic panel  Status: Abnormal   Collection Time: 04/04/16  8:52 PM  Result Value Ref Range   Sodium 134 (L) 135 - 145 mmol/L   Potassium 3.5 3.5 - 5.1 mmol/L   Chloride 102 101 - 111 mmol/L   CO2 21 (L) 22 - 32 mmol/L   Glucose, Bld 161 (H) 65 - 99 mg/dL   BUN 21 (H) 6 - 20 mg/dL   Creatinine, Ser 1.94 (H) 0.61 - 1.24 mg/dL   Calcium 9.8 8.9 - 10.3 mg/dL   Total Protein 8.0 6.5 - 8.1 g/dL   Albumin 4.5 3.5 - 5.0 g/dL   AST 39 15 - 41 U/L   ALT 47 17 - 63 U/L   Alkaline Phosphatase 65 38 - 126 U/L   Total Bilirubin 0.5 0.3 - 1.2 mg/dL   GFR calc non  Af Amer 39 (L) >60 mL/min   GFR calc Af Amer 45 (L) >60 mL/min    Comment: (NOTE) The eGFR has been calculated using the CKD EPI equation. This calculation has not been validated in all clinical situations. eGFR's persistently <60 mL/min signify possible Chronic Kidney Disease.    Anion gap 11 5 - 15  Ethanol     Status: None   Collection Time: 04/04/16  8:52 PM  Result Value Ref Range   Alcohol, Ethyl (B) <5 <5 mg/dL    Comment:        LOWEST DETECTABLE LIMIT FOR SERUM ALCOHOL IS 5 mg/dL FOR MEDICAL PURPOSES ONLY   Salicylate level     Status: None   Collection Time: 04/04/16  8:52 PM  Result Value Ref Range   Salicylate Lvl <7.6 2.8 - 30.0 mg/dL  Acetaminophen level     Status: Abnormal   Collection Time: 04/04/16  8:52 PM  Result Value Ref Range   Acetaminophen (Tylenol), Serum <10 (L) 10 - 30 ug/mL    Comment:        THERAPEUTIC CONCENTRATIONS VARY SIGNIFICANTLY. A RANGE OF 10-30 ug/mL MAY BE AN EFFECTIVE CONCENTRATION FOR MANY PATIENTS. HOWEVER, SOME ARE BEST TREATED AT CONCENTRATIONS OUTSIDE THIS RANGE. ACETAMINOPHEN CONCENTRATIONS >150 ug/mL AT 4 HOURS AFTER INGESTION AND >50 ug/mL AT 12 HOURS AFTER INGESTION ARE OFTEN ASSOCIATED WITH TOXIC REACTIONS.   cbc     Status: Abnormal   Collection Time: 04/04/16  8:52 PM  Result Value Ref Range   WBC 17.2 (H) 4.0 - 10.5 K/uL   RBC 5.29 4.22 - 5.81 MIL/uL   Hemoglobin 16.0 13.0 - 17.0 g/dL   HCT 44.9 39.0 - 52.0 %   MCV 84.9 78.0 - 100.0 fL   MCH 30.2 26.0 - 34.0 pg   MCHC 35.6 30.0 - 36.0 g/dL   RDW 13.0 11.5 - 15.5 %   Platelets 520 (H) 150 - 400 K/uL  CBG monitoring, ED     Status: Abnormal   Collection Time: 04/05/16  1:42 AM  Result Value Ref Range   Glucose-Capillary 113 (H) 65 - 99 mg/dL   Comment 1 Notify RN   CBG monitoring, ED     Status: Abnormal   Collection Time: 04/05/16  8:17 AM  Result Value Ref Range   Glucose-Capillary 112 (H) 65 - 99 mg/dL    Current Facility-Administered Medications   Medication Dose Route Frequency Provider Last Rate Last Dose  . acetaminophen (TYLENOL) tablet 650 mg  650 mg Oral Q4H PRN Nona Dell, PA-C      . alum & mag hydroxide-simeth (MAALOX/MYLANTA) 200-200-20 MG/5ML suspension 30 mL  30  mL Oral PRN Nona Dell, PA-C      . ibuprofen (ADVIL,MOTRIN) tablet 600 mg  600 mg Oral Q8H PRN Nona Dell, PA-C      . metoprolol tartrate (LOPRESSOR) tablet 25 mg  25 mg Oral BID Nona Dell, PA-C   25 mg at 04/05/16 9629  . mirtazapine (REMERON) tablet 30 mg  30 mg Oral QHS Nona Dell, PA-C      . ondansetron [facility]) tablet 4 mg  4 mg Oral Q8H PRN Nona Dell, PA-C      . risperiDONE (RISPERDAL) tablet 2 mg  2 mg Oral [address] [place], [place]   2 mg at 04/05/16 5284  . simvastatin (ZOCOR) tablet 40 mg  40 mg Oral q1800 Nona Dell, PA-C      . zolpidem [facility]) tablet 5 mg  5 mg Oral QHS PRN Nona Dell, PA-C       Current Outpatient Prescriptions  Medication Sig Dispense Refill  . aspirin EC 81 MG tablet Take 1 tablet (81 mg total) by mouth daily.    Marland Kitchen loratadine (CLARITIN) 10 MG tablet Take 1 tablet (10 mg total) by mouth daily as needed. 90 tablet 1  . metoprolol tartrate (LOPRESSOR) 25 MG tablet Take 1 tablet (25 mg total) by mouth 2 (two) times daily. 180 tablet 1  . mirtazapine (REMERON) 30 MG tablet TAKE 1 TABLET (30 MG TOTAL) BY MOUTH AT BEDTIME. 90 tablet 0  . risperiDONE (RISPERDAL) 2 MG tablet TAKE 1 TABLET EVERY MORNING 90 tablet 0  . simvastatin (ZOCOR) 40 MG tablet Take 1 tablet (40 mg total) by mouth daily. Office visit due now 90 tablet 0  . risperidone (RISPERDAL) 4 MG tablet TAKE 1 TABLET AT BEDTIME 90 tablet 0    Musculoskeletal: Strength & Muscle Tone: within normal limits Gait & Station: normal Patient leans: N/A  Psychiatric Specialty Exam: Physical Exam  Constitutional: He is oriented to person, place, and time. He appears  well-developed and well-nourished.  HENT:  Head: Normocephalic.  Respiratory: Effort normal.  Musculoskeletal: Normal range of motion.  Neurological: He is alert and oriented to person, place, and time.  Psychiatric: Thought content normal. His mood appears anxious. His speech is tangential. He is actively hallucinating. Cognition and memory are impaired. He expresses inappropriate judgment.    Review of Systems  Psychiatric/Behavioral: Positive for hallucinations. The patient is nervous/anxious.   All other systems reviewed and are negative.   Blood pressure 134/91, pulse 99, temperature 98 F (36.7 C), temperature source Oral, resp. rate 20, SpO2 98 %.There is no height or weight on file to calculate BMI.  General Appearance: Disheveled  Eye Contact:  Fair  Speech:  Pressured  Volume:  Increased  Mood:  Anxious  Affect:  Blunt  Thought Process:  Coherent and Descriptions of Associations: Intact  Orientation:  Full (Time, Place, and Person)  Thought Content:  Hallucinations: Auditory Visual  Suicidal Thoughts:  No  Homicidal Thoughts:  No  Memory:  Immediate;   Fair Recent;   Fair Remote;   Fair  Judgement:  Impaired  Insight:  Fair  Psychomotor Activity:  Decreased  Concentration:  Concentration: Fair and Attention Span: Fair  Recall:  [name] of Knowledge:  Fair  Language:  Good  Akathisia:  No  Handed:  Right  AIMS (if indicated):     Assets:  Housing Leisure Time Physical Health Resilience Social Support  ADL's:  Intact  Cognition:  Impaired,  Mild  Sleep:       Treatment Plan Summary: Daily contact with patient to assess and evaluate symptoms and progress in treatment, Medication management and Plan schizophrenia, unspecified:  -Crisis stabilization -Medication management:  Continue Risperdal 2 mg at bedtime for psychosis and Remeron 30 mg at bedtime for sleep -Individual counseling  Disposition: Recommend psychiatric Inpatient admission when medically  cleared.  Waylan Boga, NP 04/05/2016 12:43 PM  Patient seen face-to-face for psychiatric evaluation, chart reviewed and case discussed with the physician extender and developed treatment plan. Reviewed the information documented and agree with the treatment plan. Corena Pilgrim, MD

## 2016-04-05 NOTE — ED Notes (Signed)
Introduced self to patient this am. Pt oriented to unit expectations.  Assessed pt for:  A) Anxiety &/or agitation: Pt is guarded and appears to be paranoid. Speech is soft and slow. He responded well to soft tone and took his medication this morning. He stays in his room except to use the restroom, and is cooperative, but has not required redirection.   S) Safety: Safety maintained with q-15-minute checks and hourly rounds by staff.  A) ADLs: Pt able to perform ADLs independently.  P) Pick-Up (room cleanliness): Pt's room clean and free of clutter.

## 2016-04-05 NOTE — ED Notes (Signed)
Bed: WTR5 Expected date:  Expected time:  Means of arrival:  Comments: 

## 2016-04-05 NOTE — BH Assessment (Signed)
[facility] Assessment Progress Note  Pt's mother, Eyoel Daws, has faxed Petition for Adjudication of Incompetence and Application for Appointment of Guardian, which has been placed on pt's chart.  Per Corena Pilgrim, MD, pt meets criteria for IVC, which he has initiated.  IVC documents have been faxed to [facility], and at 11:39 Joycelyn Rua confirms receipt.  As of this writing, service of Findings and Custody Order is pending.  Petition, First Examination, and guardianship document have all been faxed to [facility], per their request, and they have confirmed receipt.  Jalene Mullet, [place] Triage Specialist [phone]

## 2016-04-05 NOTE — ED Provider Notes (Signed)
[facility] DEPT Provider Note   CSN: [id number] Arrival date & time: 04/04/16  [id number]     History   Chief Complaint Chief Complaint  Patient presents with  . Medical Clearance    HPI [name] [name]. is a 51 y.o. male.  HPI   Patient is a 51 year old male with history of diabetes, hypertension, hyperlipidemia, and schizophrenia who presents to the ED from behavioral health. Patient reports she does not know why he is here. Denies any pain or complaints at this time. Denies SI, HI, or hallucinations. Denies any alcohol or drug use.  Chart review shows patient's Behavioral Health assessment performed on 04/04/16 at 7pm showed patient was actively hallucinating with tangential speech and flight of ideas. Patient's mother reports patient has been having behavioral changes over the past 2 weeks and has appeared more agitated and irritated. Patient admitted to not taking his medication for his schizophrenia and a while. Behavioral Health recommends inpatient treatment.  Past Medical History:  Diagnosis Date  . Diabetes mellitus type II, uncontrolled ([place])   . HTN (hypertension)   . Hyperlipidemia LDL goal <70   . Schizophrenia [facility])     Patient Active Problem List   Diagnosis Date Noted  . DM (diabetes mellitus) type II uncontrolled, periph vascular disorder ([place]) 02/29/2016  . Schizophrenia ([place]) 06/02/2015  . Hyperlipidemia 09/23/2014  . Renal insufficiency 09/23/2014  . HTN (hypertension) 03/22/2013  . Seasonal allergies 03/22/2013    History reviewed. No pertinent surgical history.     Home Medications    Prior to Admission medications   Medication Sig Start Date End Date Taking? Authorizing Provider  aspirin EC 81 MG tablet Take 1 tablet (81 mg total) by mouth daily. 01/21/16  Yes Debbrah Alar, NP  loratadine (CLARITIN) 10 MG tablet Take 1 tablet (10 mg total) by mouth daily as needed. 01/27/15  Yes Yvonne R Lowne Chase, DO  metoprolol tartrate  (LOPRESSOR) 25 MG tablet Take 1 tablet (25 mg total) by mouth 2 (two) times daily. 06/02/15  Yes Yvonne R Lowne Chase, DO  mirtazapine (REMERON) 30 MG tablet TAKE 1 TABLET (30 MG TOTAL) BY MOUTH AT BEDTIME. 03/03/16  Yes Kathlee Nations, MD  risperiDONE (RISPERDAL) 2 MG tablet TAKE 1 TABLET EVERY MORNING 03/29/16  Yes Kathlee Nations, MD  simvastatin (ZOCOR) 40 MG tablet Take 1 tablet (40 mg total) by mouth daily. Office visit due now 01/27/16  Yes Rosalita Chessman Chase, DO  risperidone (RISPERDAL) 4 MG tablet TAKE 1 TABLET AT BEDTIME 03/03/16   Kathlee Nations, MD    Family History Family History  Problem Relation Age of Onset  . Breast cancer Mother   . Diabetes Mother   . Prostate cancer Father     StepFather  . Hypertension Father   . Breast cancer Maternal Aunt   . Diabetes Maternal Uncle     Social History Social History  Substance Use Topics  . Smoking status: Current Every Day Smoker    Packs/day: 1.00    Years: 30.00    Types: Cigarettes, Cigars  . Smokeless tobacco: Never Used     Comment: cigarettes-1 pack every couple of days, cigars-pack a day  . Alcohol use No     Comment: Socially     Allergies   Patient has no known allergies.   Review of Systems Review of Systems  Unable to perform ROS: Psychiatric disorder     Physical Exam Updated Vital Signs BP (!) 158/117 (BP Location: Right Arm)  Pulse 99   Temp 99 F (37.2 C) (Oral)   Resp 18   SpO2 97%   Physical Exam  Constitutional: He is oriented to person, place, and time. He appears well-developed and well-nourished.  HENT:  Head: Normocephalic and atraumatic.  Mouth/Throat: Uvula is midline, oropharynx is clear and moist and mucous membranes are normal. No oropharyngeal exudate, posterior oropharyngeal edema, posterior oropharyngeal erythema or tonsillar abscesses. No tonsillar exudate.  Eyes: Conjunctivae and EOM are normal. Pupils are equal, round, and reactive to light. Right eye exhibits no discharge.  Left eye exhibits no discharge. No scleral icterus.  Neck: Normal range of motion. Neck supple.  Cardiovascular: Normal rate, regular rhythm, normal heart sounds and intact distal pulses.   Pulmonary/Chest: Effort normal and breath sounds normal. No respiratory distress. He has no wheezes. He has no rales. He exhibits no tenderness.  Abdominal: Soft. Bowel sounds are normal. He exhibits no distension and no mass. There is no tenderness. There is no rebound and no guarding.  Musculoskeletal: He exhibits no edema.  Neurological: He is alert and oriented to person, place, and time.  Skin: Skin is warm and dry.  Psychiatric: His affect is blunt. His speech is delayed. He is agitated and withdrawn. Cognition and memory are impaired. He expresses inappropriate judgment.  Nursing note and vitals reviewed.    ED Treatments / Results  Labs (all labs ordered are listed, but only abnormal results are displayed) Labs Reviewed  COMPREHENSIVE METABOLIC PANEL - Abnormal; Notable for the following:       Result Value   Sodium 134 (*)    CO2 21 (*)    Glucose, Bld 161 (*)    BUN 21 (*)    Creatinine, Ser 1.94 (*)    GFR calc non Af Amer 39 (*)    GFR calc Af Amer 45 (*)    All other components within normal limits  ACETAMINOPHEN LEVEL - Abnormal; Notable for the following:    Acetaminophen (Tylenol), Serum <10 (*)    All other components within normal limits  CBC - Abnormal; Notable for the following:    WBC 17.2 (*)    Platelets 520 (*)    All other components within normal limits  ETHANOL  SALICYLATE LEVEL  RAPID URINE DRUG SCREEN, HOSP PERFORMED    EKG  EKG Interpretation None       Radiology No results found.  Procedures Procedures (including critical care time)  Medications Ordered in ED Medications  LORazepam (ATIVAN) tablet 1 mg (not administered)  acetaminophen (TYLENOL) tablet 650 mg (not administered)  ibuprofen (ADVIL,MOTRIN) tablet 600 mg (not administered)    zolpidem (AMBIEN) tablet 5 mg (not administered)  ondansetron (ZOFRAN) tablet 4 mg (not administered)  alum & mag hydroxide-simeth (MAALOX/MYLANTA) 200-200-20 MG/5ML suspension 30 mL (not administered)  metoprolol tartrate (LOPRESSOR) tablet 25 mg (not administered)  mirtazapine (REMERON) tablet 30 mg (not administered)  risperiDONE (RISPERDAL) tablet 2 mg (not administered)  simvastatin (ZOCOR) tablet 40 mg (not administered)     Initial Impression / Assessment and Plan / ED Course  I have reviewed the triage vital signs and the nursing notes.  Pertinent labs & imaging results that were available during my care of the patient were reviewed by me and considered in my medical decision making (see chart for details).     Patient presents from behavioral health for medical clearance. VSS. Exam unremarkable. WBC 17.2, pt without signs/indications of infection on exam. Patient medically cleared. Behavioral Health recommends inpatient treatment.  Final Clinical Impressions(s) / ED Diagnoses   Final diagnoses:  Schizophrenia, unspecified type [facility])    New Prescriptions New Prescriptions   No medications on file        Nona Dell, Hershal Coria 04/05/16 0126    April Palumbo, MD 04/05/16 0134

## 2016-04-05 NOTE — ED Notes (Signed)
Called Dr. Pam Drown for diabetic orders, Dr reported she will get to it in a little while.

## 2016-04-05 NOTE — BH Assessment (Signed)
Received call from Roderic Palau at [facility] stating they will have a bed for Pt later today but need a copy of guardianship papers.   Orpah Greek Anson Fret, LPC, [facility], [facility] Triage Specialist [phone]

## 2016-04-05 NOTE — ED Notes (Signed)
Bed: [id number] Expected date:  Expected time:  Means of arrival:  Comments: [place]

## 2016-04-08 ENCOUNTER — Ambulatory Visit (HOSPITAL_COMMUNITY): Payer: Self-pay | Admitting: Psychiatry

## 2016-04-08 ENCOUNTER — Telehealth (HOSPITAL_COMMUNITY): Payer: Self-pay

## 2016-04-08 NOTE — Telephone Encounter (Signed)
Will need discharge summary before scheduled appointment.

## 2016-04-08 NOTE — Telephone Encounter (Signed)
Patients mother is calling to report patient has been admitted to [facility] as of Sunday.

## 2016-04-20 ENCOUNTER — Ambulatory Visit (HOSPITAL_COMMUNITY): Payer: Self-pay | Admitting: Licensed Clinical Social Worker

## 2016-05-03 ENCOUNTER — Other Ambulatory Visit: Payer: Self-pay | Admitting: Family Medicine

## 2016-05-03 ENCOUNTER — Ambulatory Visit (HOSPITAL_COMMUNITY): Payer: Self-pay | Admitting: Psychiatry

## 2016-05-03 DIAGNOSIS — I10 Essential (primary) hypertension: Secondary | ICD-10-CM

## 2016-05-05 ENCOUNTER — Encounter (HOSPITAL_COMMUNITY): Payer: Self-pay | Admitting: Psychiatry

## 2016-05-05 ENCOUNTER — Ambulatory Visit (INDEPENDENT_AMBULATORY_CARE_PROVIDER_SITE_OTHER): Payer: Medicare HMO | Admitting: Psychiatry

## 2016-05-05 VITALS — BP 122/70 | HR 86 | Ht 69.0 in | Wt 233.8 lb

## 2016-05-05 DIAGNOSIS — F17210 Nicotine dependence, cigarettes, uncomplicated: Secondary | ICD-10-CM | POA: Diagnosis not present

## 2016-05-05 DIAGNOSIS — Z8042 Family history of malignant neoplasm of prostate: Secondary | ICD-10-CM | POA: Diagnosis not present

## 2016-05-05 DIAGNOSIS — Z803 Family history of malignant neoplasm of breast: Secondary | ICD-10-CM

## 2016-05-05 DIAGNOSIS — E119 Type 2 diabetes mellitus without complications: Secondary | ICD-10-CM | POA: Diagnosis not present

## 2016-05-05 DIAGNOSIS — F209 Schizophrenia, unspecified: Secondary | ICD-10-CM | POA: Diagnosis not present

## 2016-05-05 DIAGNOSIS — Z8249 Family history of ischemic heart disease and other diseases of the circulatory system: Secondary | ICD-10-CM | POA: Diagnosis not present

## 2016-05-05 DIAGNOSIS — Z833 Family history of diabetes mellitus: Secondary | ICD-10-CM

## 2016-05-05 DIAGNOSIS — F1220 Cannabis dependence, uncomplicated: Secondary | ICD-10-CM

## 2016-05-05 DIAGNOSIS — Z79899 Other long term (current) drug therapy: Secondary | ICD-10-CM | POA: Diagnosis not present

## 2016-05-05 DIAGNOSIS — I10 Essential (primary) hypertension: Secondary | ICD-10-CM

## 2016-05-05 DIAGNOSIS — E785 Hyperlipidemia, unspecified: Secondary | ICD-10-CM

## 2016-05-05 MED ORDER — TRAZODONE HCL 50 MG PO TABS: 50.0000 mg | ORAL_TABLET | Freq: Every day | ORAL | 2 refills | Status: DC

## 2016-05-05 MED ORDER — RISPERIDONE 2 MG PO TABS: ORAL_TABLET | ORAL | 2 refills | Status: DC

## 2016-05-05 MED ORDER — MIRTAZAPINE 30 MG PO TABS: 30.0000 mg | ORAL_TABLET | Freq: Every day | ORAL | 2 refills | Status: DC

## 2016-05-05 NOTE — Progress Notes (Signed)
[place] MD/PA/NP OP Progress Note  05/05/2016 1:48 PM [name] [name].  MRN:  [id number]  Chief Complaint:  Subjective:  I was admitted in the hospital but I am doing better now.  HPI: [name] [name] came for his follow-up appointment.  He was admitted to old [facility] in January due to decompensation into his illness.  He admitted he was noncompliant with medication because he was forgetful.  Initially he was seen in the emergency room and then recommended inpatient treatment.  He stayed at old [facility] for a few days.  There were no changes in his medication.  He was continued on Risperdal Remeron and given trazodone as needed.  Since discharge from the hospital he is taking his medication and he sleeping better.  He has no hallucination, paranoia or any irritability.  He has no tremors or shakes.  Patient is not interested in counseling.  We discuss injectable patient reluctant to get injections and preferred to continue oral medication.  He is a poor historian.  Most of the information was obtained from electronic medical record.  He lives with his mother who is also guarded [name] [name].  He was noted at times talking to himself but he was not agitated or irritable.  He agreed that he need to continue his medication to avoid decompensation.  Patient is not interested and Cogentin and he does not have any tremors or shakes.  His appetite is okay.  His vital signs are stable.  He continues to smoke marijuana on and off but denies any other illegal substance use.  He denies any paranoia, crying spells, self abusive behavior or any suicidal thoughts.  Visit Diagnosis:    ICD-9-CM ICD-10-CM   1. Schizophrenia, unspecified type ([place]) 295.90 F20.9 risperiDONE (RISPERDAL) 2 MG tablet     mirtazapine (REMERON) 30 MG tablet     traZODone (DESYREL) 50 MG tablet  2. Essential hypertension 401.9 I10     Past Psychiatric History: Reviewed.  Patient has history of psychiatric illness since age 25. He was  in [place] when symptoms started.  He remember his behavior became very irrational and bizarre.  He has at least 3 psychiatric hospitalization.   he was admitted at [place] and his last admission was in January 2018 at old [facility] due to noncompliance with medication.  In the past he has taken Abilify, Lexapro, Neurontin. Patient denies any history of suicidal attempt.  As per record he has history of paranoia, hallucination and bizarre behavior.  Past Medical History: Patient see Dr. Etter Sjogren.  He has diabetes and high creatinine.  His last hemoglobin A1c is 6.8 which was done 3 months ago.  His last CBC was 1 month ago but shows WBC count 17.2.  His chemistry shows creatinine 1.94 which was jumped from 1.68. Past Medical History:  Diagnosis Date  . Diabetes mellitus type II, uncontrolled ([place])   . HTN (hypertension)   . Hyperlipidemia LDL goal <70   . Schizophrenia ([place])    History reviewed. No pertinent surgical history.  Family Psychiatric History: Reviewed.  Family History:  Family History  Problem Relation Age of Onset  . Breast cancer Mother   . Diabetes Mother   . Prostate cancer Father     StepFather  . Hypertension Father   . Breast cancer Maternal Aunt   . Diabetes Maternal Uncle     Social History:  Social History   Social History  . Marital status: Single    Spouse  name: N/A  . Number of children: N/A  . Years of education: N/A   Social History Main Topics  . Smoking status: Current Every Day Smoker    Packs/day: 1.00    Years: 30.00    Types: Cigarettes, Cigars  . Smokeless tobacco: Never Used     Comment: cigarettes-1 pack every couple of days, cigars-pack a day  . Alcohol use No     Comment: Socially / not often  . Drug use: Yes    Types: Marijuana     Comment: occasional  . Sexual activity: No   Other Topics Concern  . None   Social History Narrative   Lives with mother   unemployed   No children     Allergies: No Known Allergies  Metabolic Disorder Labs: Recent Results (from the past 2160 hour(s))  Comprehensive metabolic panel     Status: Abnormal   Collection Time: 04/04/16  8:52 PM  Result Value Ref Range   Sodium 134 (L) 135 - 145 mmol/L   Potassium 3.5 3.5 - 5.1 mmol/L   Chloride 102 101 - 111 mmol/L   CO2 21 (L) 22 - 32 mmol/L   Glucose, Bld 161 (H) 65 - 99 mg/dL   BUN 21 (H) 6 - 20 mg/dL   Creatinine, Ser 1.94 (H) 0.61 - 1.24 mg/dL   Calcium 9.8 8.9 - 10.3 mg/dL   Total Protein 8.0 6.5 - 8.1 g/dL   Albumin 4.5 3.5 - 5.0 g/dL   AST 39 15 - 41 U/L   ALT 47 17 - 63 U/L   Alkaline Phosphatase 65 38 - 126 U/L   Total Bilirubin 0.5 0.3 - 1.2 mg/dL   GFR calc non Af Amer 39 (L) >60 mL/min   GFR calc Af Amer 45 (L) >60 mL/min    Comment: (NOTE) The eGFR has been calculated using the CKD EPI equation. This calculation has not been validated in all clinical situations. eGFR's persistently <60 mL/min signify possible Chronic Kidney Disease.    Anion gap 11 5 - 15  Ethanol     Status: None   Collection Time: 04/04/16  8:52 PM  Result Value Ref Range   Alcohol, Ethyl (B) <5 <5 mg/dL    Comment:        LOWEST DETECTABLE LIMIT FOR SERUM ALCOHOL IS 5 mg/dL FOR MEDICAL PURPOSES ONLY   Salicylate level     Status: None   Collection Time: 04/04/16  8:52 PM  Result Value Ref Range   Salicylate Lvl <6.9 2.8 - 30.0 mg/dL  Acetaminophen level     Status: Abnormal   Collection Time: 04/04/16  8:52 PM  Result Value Ref Range   Acetaminophen (Tylenol), Serum <10 (L) 10 - 30 ug/mL    Comment:        THERAPEUTIC CONCENTRATIONS VARY SIGNIFICANTLY. A RANGE OF 10-30 ug/mL MAY BE AN EFFECTIVE CONCENTRATION FOR MANY PATIENTS. HOWEVER, SOME ARE BEST TREATED AT CONCENTRATIONS OUTSIDE THIS RANGE. ACETAMINOPHEN CONCENTRATIONS >150 ug/mL AT 4 HOURS AFTER INGESTION AND >50 ug/mL AT 12 HOURS AFTER INGESTION ARE OFTEN ASSOCIATED WITH TOXIC REACTIONS.   cbc     Status: Abnormal    Collection Time: 04/04/16  8:52 PM  Result Value Ref Range   WBC 17.2 (H) 4.0 - 10.5 K/uL   RBC 5.29 4.22 - 5.81 MIL/uL   Hemoglobin 16.0 13.0 - 17.0 g/dL   HCT 44.9 39.0 - 52.0 %   MCV 84.9 78.0 - 100.0 fL   MCH 30.2 26.0 -  34.0 pg   MCHC 35.6 30.0 - 36.0 g/dL   RDW 13.0 11.5 - 15.5 %   Platelets 520 (H) 150 - 400 K/uL  CBG monitoring, ED     Status: Abnormal   Collection Time: 04/05/16  1:42 AM  Result Value Ref Range   Glucose-Capillary 113 (H) 65 - 99 mg/dL   Comment 1 Notify RN   CBG monitoring, ED     Status: Abnormal   Collection Time: 04/05/16  8:17 AM  Result Value Ref Range   Glucose-Capillary 112 (H) 65 - 99 mg/dL   Lab Results  Component Value Date   HGBA1C 6.8 (H) 01/19/2016   No results found for: PROLACTIN Lab Results  Component Value Date   CHOL 178 06/02/2015   TRIG 187.0 (H) 06/02/2015   HDL 33.60 (L) 06/02/2015   CHOLHDL 5 06/02/2015   VLDL 37.4 06/02/2015   LDLCALC 107 (H) 06/02/2015     Current Medications: Current Outpatient Prescriptions  Medication Sig Dispense Refill  . loratadine (CLARITIN) 10 MG tablet Take 1 tablet (10 mg total) by mouth daily as needed. 90 tablet 1  . metoprolol tartrate (LOPRESSOR) 25 MG tablet TAKE 1 TABLET (25 MG TOTAL) BY MOUTH 2 (TWO) TIMES DAILY. 180 tablet 1  . mirtazapine (REMERON) 30 MG tablet TAKE 1 TABLET (30 MG TOTAL) BY MOUTH AT BEDTIME. 90 tablet 0  . risperiDONE (RISPERDAL) 2 MG tablet TAKE 1 TABLET EVERY MORNING 90 tablet 0  . risperidone (RISPERDAL) 4 MG tablet TAKE 1 TABLET AT BEDTIME 90 tablet 0  . aspirin EC 81 MG tablet Take 1 tablet (81 mg total) by mouth daily. (Patient not taking: Reported on 05/05/2016)    . simvastatin (ZOCOR) 40 MG tablet Take 1 tablet (40 mg total) by mouth daily. Office visit due now (Patient not taking: Reported on 05/05/2016) 90 tablet 0   No current facility-administered medications for this visit.     Neurologic: Headache: No Seizure: No Paresthesias:  No  Musculoskeletal: Strength & Muscle Tone: within normal limits Gait & Station: normal Patient leans: N/A  Psychiatric Specialty Exam: Review of Systems  Constitutional: Negative.   HENT: Negative.   Respiratory: Negative for cough.   Cardiovascular: Negative for chest pain.  Musculoskeletal: Negative.   Skin: Negative.     Blood pressure 122/70, pulse 86, height _0  (1.753 m), weight 233 lb 12.8 oz (106.1 kg).Body mass index is 34.53 kg/m.  General Appearance: Disheveled and Poor dental hygiene, long hair and fairly groomed  Eye Contact:  Fair  Speech:  Fast and rambling  Volume:  Normal  Mood:  Anxious  Affect:  Labile  Thought Process:  Descriptions of Associations: Circumstantial  Orientation:  Full (Time, Place, and Person)  Thought Content: Rumination and Denies any auditory or visual hallucination but seen talking to himself at times.   Suicidal Thoughts:  No  Homicidal Thoughts:  No  Memory:  Immediate;   Fair Recent;   Fair Remote;   Fair  Judgement:  Fair  Insight:  Fair  Psychomotor Activity:  Normal  Concentration:  Concentration: Poor and Attention Span: Fair  Recall:  [name] of Knowledge: Fair  Language: Fair  Akathisia:  No  Handed:  Right  AIMS (if indicated):  0  Assets:  Desire for Improvement Housing  ADL's:  Intact  Cognition: Impaired,  Mild  Sleep:  Fair    Assessment: Schizophrenia chronic paranoid type, cannabis abuse  Plan: I reviewed discharge summary, current medication, recent blood  work results and records from emergency room.  Patient has high creatinine and recently seen his primary care physician.  He does not want to change his medication.  He is a poor historian and most of the information was obtained from electronic medical record.  I will continue Risperdal 2 mg in the morning and 4 mg at bedtime, he has no tremors or shakes.  Continue Remeron 30 mg at bedtime and trazodone 50 mg as needed.  Encouraged to continue  medication.  We did talk about Risperdal Consta but patient reluctant to take any injections.  We also talk about cannabis use , patient minimizes his use and does not believe that he need any treatment for substance use.  He's not interested in counseling.  I encouraged to discuss with his primary care physician about getting renal consult due to high creatinine.  I will send my note to his primary care physician and I will see him again in 3 months. Discussed medication side effects and benefits.  Recommended to call us back if there is any question, concern or worsening of the symptoms.  Discuss safety plan that anytime having active suicidal thoughts or homicidal thoughts and she need to call 911 or go to the local emergency room.     Daytona Hedman T., MD 05/05/2016, 1:48 PM

## 2016-05-11 ENCOUNTER — Ambulatory Visit (HOSPITAL_COMMUNITY): Payer: Self-pay | Admitting: Licensed Clinical Social Worker

## 2016-05-11 ENCOUNTER — Telehealth (HOSPITAL_COMMUNITY): Payer: Self-pay

## 2016-05-11 NOTE — Telephone Encounter (Signed)
Medication problem - Telephone call with pateint's Mother who read a letter that patient's Risperdal was not currently covered under his [facility] plan or was at quantity limit.  Agreed to follow up with a prior authorization for requested approval. Silvano Bilis at patient's [facility] on [address] to get the phone number for [facility] [phone] and patients [employer] ID [id number].  Called [employer] and spoke with Mallie Mussel to complete patient's prior authorization request.  Decision should be received by fax within 24-48 hours per report from representative.

## 2016-05-25 ENCOUNTER — Other Ambulatory Visit: Payer: Self-pay | Admitting: Family Medicine

## 2016-05-25 DIAGNOSIS — E785 Hyperlipidemia, unspecified: Secondary | ICD-10-CM

## 2016-05-27 ENCOUNTER — Encounter: Payer: Self-pay | Admitting: Family Medicine

## 2016-05-27 ENCOUNTER — Ambulatory Visit (INDEPENDENT_AMBULATORY_CARE_PROVIDER_SITE_OTHER): Payer: Medicare HMO | Admitting: Family Medicine

## 2016-05-27 VITALS — BP 102/68 | HR 84 | Temp 98.0°F | Resp 16 | Ht 69.0 in | Wt 237.6 lb

## 2016-05-27 DIAGNOSIS — I10 Essential (primary) hypertension: Secondary | ICD-10-CM | POA: Diagnosis not present

## 2016-05-27 DIAGNOSIS — IMO0002 Reserved for concepts with insufficient information to code with codable children: Secondary | ICD-10-CM

## 2016-05-27 DIAGNOSIS — E1165 Type 2 diabetes mellitus with hyperglycemia: Secondary | ICD-10-CM

## 2016-05-27 DIAGNOSIS — E119 Type 2 diabetes mellitus without complications: Secondary | ICD-10-CM

## 2016-05-27 DIAGNOSIS — F209 Schizophrenia, unspecified: Secondary | ICD-10-CM | POA: Diagnosis not present

## 2016-05-27 DIAGNOSIS — E785 Hyperlipidemia, unspecified: Secondary | ICD-10-CM | POA: Diagnosis not present

## 2016-05-27 DIAGNOSIS — E1151 Type 2 diabetes mellitus with diabetic peripheral angiopathy without gangrene: Secondary | ICD-10-CM

## 2016-05-27 NOTE — Progress Notes (Signed)
Subjective:  I acted as a Education administrator for Dr. Royden Purl, LPN    Patient ID: [name] [name]., male    DOB: May 26, 1965, 51 y.o.   MRN: [id number]  Chief Complaint  Patient presents with  . Hypertension    follow up  . Hyperlipidemia    follow up  . Diabetes    follow up    Hypertension  This is a chronic problem. The current episode started in the past 7 days. The problem is controlled. Pertinent negatives include no blurred vision, chest pain, headaches or palpitations.  Hyperlipidemia  This is a chronic problem. The current episode started more than 1 year ago. The problem is controlled. Pertinent negatives include no chest pain.  Diabetes  He presents for his follow-up diabetic visit. He has type 2 diabetes mellitus. Pertinent negatives for hypoglycemia include no headaches. Pertinent negatives for diabetes include no blurred vision and no chest pain.    Patient is in today for follow up hypertension, hyperlipidemia, and diabetes. Patient state he has no concerns at this time.   Patient Care Team: Ann Held, DO as PCP - General (Family Medicine)   Past Medical History:  Diagnosis Date  . Diabetes mellitus type II, uncontrolled ([place])   . HTN (hypertension)   . Hyperlipidemia LDL goal <70   . Schizophrenia ([place])     No past surgical history on file.  Family History  Problem Relation Age of Onset  . Breast cancer Mother   . Diabetes Mother   . Prostate cancer Father     StepFather  . Hypertension Father   . Breast cancer Maternal Aunt   . Diabetes Maternal Uncle     Social History   Social History  . Marital status: Single    Spouse name: N/A  . Number of children: N/A  . Years of education: N/A   Occupational History  . Not on file.   Social History Main Topics  . Smoking status: Current Every Day Smoker    Packs/day: 1.00    Years: 30.00    Types: Cigarettes, Cigars  . Smokeless tobacco: Never Used     Comment: cigarettes-1 pack  every couple of days, cigars-pack a day  . Alcohol use No     Comment: Socially / not often  . Drug use: Yes    Types: Marijuana     Comment: occasional  . Sexual activity: No   Other Topics Concern  . Not on file   Social History Narrative   Lives with mother   unemployed   No children    Outpatient Medications Prior to Visit  Medication Sig Dispense Refill  . aspirin EC 81 MG tablet Take 1 tablet (81 mg total) by mouth daily.    Marland Kitchen loratadine (CLARITIN) 10 MG tablet Take 1 tablet (10 mg total) by mouth daily as needed. 90 tablet 1  . metoprolol tartrate (LOPRESSOR) 25 MG tablet TAKE 1 TABLET (25 MG TOTAL) BY MOUTH 2 (TWO) TIMES DAILY. 180 tablet 1  . mirtazapine (REMERON) 30 MG tablet Take 1 tablet (30 mg total) by mouth at bedtime. 30 tablet 2  . risperiDONE (RISPERDAL) 2 MG tablet Take 1 tab in am and 2 at bed time 90 tablet 2  . simvastatin (ZOCOR) 40 MG tablet Take 1 tablet (40 mg total) by mouth daily. Office visit due now 90 tablet 1  . traZODone (DESYREL) 50 MG tablet Take 1 tablet (50 mg total) by mouth at bedtime. [place]  tablet 2   No facility-administered medications prior to visit.     No Known Allergies  Review of Systems  Constitutional: Negative for fever.  HENT: Negative for congestion.   Eyes: Negative for blurred vision.  Respiratory: Negative for cough.   Cardiovascular: Negative for chest pain and palpitations.  Gastrointestinal: Negative for vomiting.  Musculoskeletal: Negative for back pain.  Skin: Negative for rash.  Neurological: Negative for loss of consciousness and headaches.       Objective:    Physical Exam  Constitutional: He is oriented to person, place, and time. He appears well-developed and well-nourished. No distress.  HENT:  Head: Normocephalic and atraumatic.  Eyes: Conjunctivae are normal.  Neck: Normal range of motion. No thyromegaly present.  Cardiovascular: Normal rate and regular rhythm.   Pulmonary/Chest: Effort normal and  breath sounds normal. He has no wheezes.  Abdominal: Soft. Bowel sounds are normal. There is no tenderness.  Musculoskeletal: Normal range of motion. He exhibits no edema or deformity.  Neurological: He is alert and oriented to person, place, and time.  Skin: Skin is warm and dry. He is not diaphoretic.  Psychiatric: He has a normal mood and affect.  Sensory exam of the foot is normal, tested with the monofilament. Good pulses, no lesions or ulcers, good peripheral pulses.  BP 102/68 (BP Location: Right Arm, Patient Position: Sitting, Cuff Size: Large)   Pulse 84   Temp 98 F (36.7 C) (Oral)   Resp 16   Ht 5\' 9"  (1.753 m)   Wt 237 lb 9.6 oz (107.8 kg)   SpO2 97%   BMI 35.09 kg/m  Wt Readings from Last 3 Encounters:  05/27/16 237 lb 9.6 oz (107.8 kg)  02/26/16 240 lb 3.2 oz (109 kg)  01/19/16 242 lb 9.6 oz (110 kg)      Immunization History  Administered Date(s) Administered  . Influenza,inj,Quad PF,36+ Mos 01/19/2016    Health Maintenance  Topic Date Due  . PNEUMOCOCCAL POLYSACCHARIDE VACCINE (1) 11/16/1969  . FOOT EXAM  11/16/1977  . OPHTHALMOLOGY EXAM  11/16/1977  . HIV Screening  11/17/1982  . TETANUS/TDAP  11/17/1986  . HEMOGLOBIN A1C  11/27/2016  . URINE MICROALBUMIN  05/27/2017  . INFLUENZA VACCINE  Completed    Lab Results  Component Value Date   WBC 7.7 05/27/2016   HGB 15.0 05/27/2016   HCT 44.7 05/27/2016   PLT 471.0 (H) 05/27/2016   GLUCOSE 61 (L) 05/27/2016   CHOL 197 05/27/2016   TRIG 209.0 (H) 05/27/2016   HDL 37.10 (L) 05/27/2016   LDLDIRECT 133.0 05/27/2016   LDLCALC 107 (H) 06/02/2015   ALT 45 05/27/2016   AST 29 05/27/2016   NA 133 (L) 05/27/2016   K 4.5 05/27/2016   CL 106 05/27/2016   CREATININE 1.74 (H) 05/27/2016   BUN 12 05/27/2016   CO2 24 05/27/2016   TSH 1.49 06/02/2015   PSA 0.63 03/22/2013   HGBA1C 6.6 (H) 05/27/2016   MICROALBUR 2.2 (H) 05/27/2016    Lab Results  Component Value Date   TSH 1.49 06/02/2015   Lab Results   Component Value Date   WBC 7.7 05/27/2016   HGB 15.0 05/27/2016   HCT 44.7 05/27/2016   MCV 88.1 05/27/2016   PLT 471.0 (H) 05/27/2016   Lab Results  Component Value Date   NA 133 (L) 05/27/2016   K 4.5 05/27/2016   CO2 24 05/27/2016   GLUCOSE 61 (L) 05/27/2016   BUN 12 05/27/2016   CREATININE 1.74 (H)  05/27/2016   BILITOT 0.2 05/27/2016   ALKPHOS 59 05/27/2016   AST 29 05/27/2016   ALT 45 05/27/2016   PROT 7.3 05/27/2016   ALBUMIN 4.3 05/27/2016   CALCIUM 9.7 05/27/2016   ANIONGAP 11 04/04/2016   GFR 54.01 (L) 05/27/2016   Lab Results  Component Value Date   CHOL 197 05/27/2016   Lab Results  Component Value Date   HDL 37.10 (L) 05/27/2016   Lab Results  Component Value Date   LDLCALC 107 (H) 06/02/2015   Lab Results  Component Value Date   TRIG 209.0 (H) 05/27/2016   Lab Results  Component Value Date   CHOLHDL 5 05/27/2016   Lab Results  Component Value Date   HGBA1C 6.6 (H) 05/27/2016         Assessment & Plan:   Problem List Items Addressed This Visit      Unprioritized   DM (diabetes mellitus) type II uncontrolled, periph vascular disorder ([place])    Diet controlled Check labs See podiatry      Relevant Orders   Hemoglobin A1c (Completed)   HTN (hypertension) - Primary    con't metoprolol stable      Relevant Orders   CBC (Completed)   Comprehensive metabolic panel (Completed)   Hyperlipidemia    Check labs  con't zocor      Relevant Orders   Lipid panel (Completed)   Microalbumin / creatinine urine ratio (Completed)   Schizophrenia ([place])    Per psych       Other Visit Diagnoses    Type 2 diabetes mellitus without complication, without long-term current use of insulin ([place])       Relevant Orders   Ambulatory referral to Podiatry      I am having Mr. Vilar maintain his loratadine, aspirin EC, metoprolol tartrate, risperiDONE, mirtazapine, traZODone, and simvastatin.  No orders of the defined types were placed in  this encounter.   CMA served as Education administrator during this visit. History, Physical and Plan performed by medical provider. Documentation and orders reviewed and attested to.  Ann Held, DO   Patient ID: Sohum Delillo., male   DOB: 02-13-1966, 51 y.o.   MRN: [id number]

## 2016-05-27 NOTE — Patient Instructions (Signed)
Diabetes Mellitus and Food It is important for you to manage your blood sugar (glucose) level. Your blood glucose level can be greatly affected by what you eat. Eating healthier foods in the appropriate amounts throughout the day at about the same time each day will help you control your blood glucose level. It can also help slow or prevent worsening of your diabetes mellitus. Healthy eating may even help you improve the level of your blood pressure and reach or maintain a healthy weight. General recommendations for healthful eating and cooking habits include:  Eating meals and snacks regularly. Avoid going long periods of time without eating to lose weight.  Eating a diet that consists mainly of plant-based foods, such as fruits, vegetables, nuts, legumes, and whole grains.  Using low-heat cooking methods, such as baking, instead of high-heat cooking methods, such as deep frying.  Work with your dietitian to make sure you understand how to use the Nutrition Facts information on food labels. How can food affect me? Carbohydrates Carbohydrates affect your blood glucose level more than any other type of food. Your dietitian will help you determine how many carbohydrates to eat at each meal and teach you how to count carbohydrates. Counting carbohydrates is important to keep your blood glucose at a healthy level, especially if you are using insulin or taking certain medicines for diabetes mellitus. Alcohol Alcohol can cause sudden decreases in blood glucose (hypoglycemia), especially if you use insulin or take certain medicines for diabetes mellitus. Hypoglycemia can be a life-threatening condition. Symptoms of hypoglycemia (sleepiness, dizziness, and disorientation) are similar to symptoms of having too much alcohol. If your health care provider has given you approval to drink alcohol, do so in moderation and use the following guidelines:  Women should not have more than one drink per day, and men  should not have more than two drinks per day. One drink is equal to: ? 12 oz of beer. ? 5 oz of wine. ? 1 oz of hard liquor.  Do not drink on an empty stomach.  Keep yourself hydrated. Have water, diet soda, or unsweetened iced tea.  Regular soda, juice, and other mixers might contain a lot of carbohydrates and should be counted.  What foods are not recommended? As you make food choices, it is important to remember that all foods are not the same. Some foods have fewer nutrients per serving than other foods, even though they might have the same number of calories or carbohydrates. It is difficult to get your body what it needs when you eat foods with fewer nutrients. Examples of foods that you should avoid that are high in calories and carbohydrates but low in nutrients include:  Trans fats (most processed foods list trans fats on the Nutrition Facts label).  Regular soda.  Juice.  Candy.  Sweets, such as cake, pie, doughnuts, and cookies.  Fried foods.  What foods can I eat? Eat nutrient-rich foods, which will nourish your body and keep you healthy. The food you should eat also will depend on several factors, including:  The calories you need.  The medicines you take.  Your weight.  Your blood glucose level.  Your blood pressure level.  Your cholesterol level.  You should eat a variety of foods, including:  Protein. ? Lean cuts of meat. ? Proteins low in saturated fats, such as fish, egg whites, and beans. Avoid processed meats.  Fruits and vegetables. ? Fruits and vegetables that may help control blood glucose levels, such as apples,   mangoes, and yams.  Dairy products. ? Choose fat-free or low-fat dairy products, such as milk, yogurt, and cheese.  Grains, bread, pasta, and rice. ? Choose whole grain products, such as multigrain bread, whole oats, and brown rice. These foods may help control blood pressure.  Fats. ? Foods containing healthful fats, such as  nuts, avocado, olive oil, canola oil, and fish.  Does everyone with diabetes mellitus have the same meal plan? Because every person with diabetes mellitus is different, there is not one meal plan that works for everyone. It is very important that you meet with a dietitian who will help you create a meal plan that is just right for you. This information is not intended to replace advice given to you by your health care provider. Make sure you discuss any questions you have with your health care provider. Document Released: 11/26/2004 Document Revised: 08/07/2015 Document Reviewed: 01/26/2013 Elsevier Interactive Patient Education  2017 Elsevier Inc.  

## 2016-05-27 NOTE — Progress Notes (Signed)
Pre visit review using our clinic review tool, if applicable. No additional management support is needed unless otherwise documented below in the visit note. 

## 2016-05-28 LAB — CBC
HEMATOCRIT: 44.7 % (ref 39.0–52.0)
Hemoglobin: 15 g/dL (ref 13.0–17.0)
MCHC: 33.5 g/dL (ref 30.0–36.0)
MCV: 88.1 fl (ref 78.0–100.0)
Platelets: 471 10*3/uL — ABNORMAL HIGH (ref 150.0–400.0)
RBC: 5.08 Mil/uL (ref 4.22–5.81)
RDW: 13.9 % (ref 11.5–15.5)
WBC: 7.7 10*3/uL (ref 4.0–10.5)

## 2016-05-28 LAB — COMPREHENSIVE METABOLIC PANEL
ALK PHOS: 59 U/L (ref 39–117)
ALT: 45 U/L (ref 0–53)
AST: 29 U/L (ref 0–37)
Albumin: 4.3 g/dL (ref 3.5–5.2)
BUN: 12 mg/dL (ref 6–23)
CHLORIDE: 106 meq/L (ref 96–112)
CO2: 24 mEq/L (ref 19–32)
Calcium: 9.7 mg/dL (ref 8.4–10.5)
Creatinine, Ser: 1.74 mg/dL — ABNORMAL HIGH (ref 0.40–1.50)
GFR: 54.01 mL/min — AB (ref >60.00)
GLUCOSE: 61 mg/dL — AB (ref 70–99)
POTASSIUM: 4.5 meq/L (ref 3.5–5.1)
SODIUM: 133 meq/L — AB (ref 135–145)
Total Bilirubin: 0.2 mg/dL (ref 0.2–1.2)
Total Protein: 7.3 g/dL (ref 6.0–8.3)

## 2016-05-28 LAB — MICROALBUMIN / CREATININE URINE RATIO
CREATININE, U: 20.7 mg/dL
MICROALB/CREAT RATIO: 10.6 mg/g (ref 0.0–30.0)
Microalb, Ur: 2.2 mg/dL — ABNORMAL HIGH (ref 0.0–1.9)

## 2016-05-28 LAB — LIPID PANEL
CHOL/HDL RATIO: 5
CHOLESTEROL: 197 mg/dL (ref 0–200)
HDL: 37.1 mg/dL — ABNORMAL LOW (ref >39.00)
NonHDL: 159.88
Triglycerides: 209 mg/dL — ABNORMAL HIGH (ref 0.0–149.0)
VLDL: 41.8 mg/dL — ABNORMAL HIGH (ref 0.0–40.0)

## 2016-05-28 LAB — LDL CHOLESTEROL, DIRECT: LDL DIRECT: 133 mg/dL

## 2016-05-28 LAB — HEMOGLOBIN A1C: HEMOGLOBIN A1C: 6.6 % — AB (ref 4.6–6.5)

## 2016-05-29 NOTE — Assessment & Plan Note (Signed)
con't metoprolol stable 

## 2016-05-29 NOTE — Assessment & Plan Note (Signed)
Per psych 

## 2016-05-29 NOTE — Assessment & Plan Note (Signed)
Check labs  con't zocor

## 2016-05-29 NOTE — Assessment & Plan Note (Signed)
Diet controlled Check labs See podiatry

## 2016-05-31 ENCOUNTER — Other Ambulatory Visit: Payer: Self-pay | Admitting: Family Medicine

## 2016-05-31 DIAGNOSIS — N289 Disorder of kidney and ureter, unspecified: Secondary | ICD-10-CM

## 2016-05-31 DIAGNOSIS — E119 Type 2 diabetes mellitus without complications: Secondary | ICD-10-CM

## 2016-06-07 ENCOUNTER — Emergency Department (HOSPITAL_COMMUNITY)
Admission: EM | Admit: 2016-06-07 | Discharge: 2016-06-08 | Disposition: A | Discharge status: Home / Self Care | Payer: Medicare HMO | Attending: Emergency Medicine | Admitting: Emergency Medicine

## 2016-06-07 ENCOUNTER — Telehealth (HOSPITAL_COMMUNITY): Payer: Self-pay

## 2016-06-07 ENCOUNTER — Encounter (HOSPITAL_COMMUNITY): Payer: Self-pay | Admitting: Emergency Medicine

## 2016-06-07 DIAGNOSIS — F201 Disorganized schizophrenia: Secondary | ICD-10-CM | POA: Diagnosis not present

## 2016-06-07 DIAGNOSIS — E119 Type 2 diabetes mellitus without complications: Secondary | ICD-10-CM | POA: Diagnosis not present

## 2016-06-07 DIAGNOSIS — Z79899 Other long term (current) drug therapy: Secondary | ICD-10-CM | POA: Diagnosis not present

## 2016-06-07 DIAGNOSIS — Z7982 Long term (current) use of aspirin: Secondary | ICD-10-CM | POA: Insufficient documentation

## 2016-06-07 DIAGNOSIS — F17210 Nicotine dependence, cigarettes, uncomplicated: Secondary | ICD-10-CM | POA: Diagnosis not present

## 2016-06-07 DIAGNOSIS — F29 Unspecified psychosis not due to a substance or known physiological condition: Secondary | ICD-10-CM

## 2016-06-07 DIAGNOSIS — I10 Essential (primary) hypertension: Secondary | ICD-10-CM | POA: Diagnosis not present

## 2016-06-07 DIAGNOSIS — F23 Brief psychotic disorder: Secondary | ICD-10-CM | POA: Diagnosis present

## 2016-06-07 DIAGNOSIS — F1290 Cannabis use, unspecified, uncomplicated: Secondary | ICD-10-CM | POA: Diagnosis not present

## 2016-06-07 LAB — COMPREHENSIVE METABOLIC PANEL
ALBUMIN: 4.4 g/dL (ref 3.5–5.0)
ALT: 42 U/L (ref 17–63)
AST: 34 U/L (ref 15–41)
Alkaline Phosphatase: 58 U/L (ref 38–126)
Anion gap: 13 (ref 5–15)
BILIRUBIN TOTAL: 1.1 mg/dL (ref 0.3–1.2)
BUN: 20 mg/dL (ref 6–20)
CHLORIDE: 104 mmol/L (ref 101–111)
CO2: 17 mmol/L — AB (ref 22–32)
Calcium: 9.6 mg/dL (ref 8.9–10.3)
Creatinine, Ser: 1.9 mg/dL — ABNORMAL HIGH (ref 0.61–1.24)
GFR calc Af Amer: 47 mL/min — ABNORMAL LOW (ref >60)
GFR calc non Af Amer: 40 mL/min — ABNORMAL LOW (ref >60)
Glucose, Bld: 88 mg/dL (ref 65–99)
POTASSIUM: 3.7 mmol/L (ref 3.5–5.1)
Sodium: 134 mmol/L — ABNORMAL LOW (ref 135–145)
TOTAL PROTEIN: 8.1 g/dL (ref 6.5–8.1)

## 2016-06-07 LAB — CBC
HEMATOCRIT: 43.9 % (ref 39.0–52.0)
HEMOGLOBIN: 15.3 g/dL (ref 13.0–17.0)
MCH: 29.2 pg (ref 26.0–34.0)
MCHC: 34.9 g/dL (ref 30.0–36.0)
MCV: 83.8 fL (ref 78.0–100.0)
Platelets: 473 10*3/uL — ABNORMAL HIGH (ref 150–400)
RBC: 5.24 MIL/uL (ref 4.22–5.81)
RDW: 13.9 % (ref 11.5–15.5)
WBC: 11.5 10*3/uL — AB (ref 4.0–10.5)

## 2016-06-07 LAB — RAPID URINE DRUG SCREEN, HOSP PERFORMED
AMPHETAMINES: NOT DETECTED
BARBITURATES: NOT DETECTED
BENZODIAZEPINES: NOT DETECTED
Cocaine: NOT DETECTED
Opiates: NOT DETECTED
TETRAHYDROCANNABINOL: POSITIVE — AB

## 2016-06-07 LAB — ACETAMINOPHEN LEVEL: Acetaminophen (Tylenol), Serum: <10 ug/mL — ABNORMAL LOW (ref 10–30)

## 2016-06-07 LAB — SALICYLATE LEVEL: Salicylate Lvl: <7 mg/dL (ref 2.8–30.0)

## 2016-06-07 LAB — ETHANOL: Alcohol, Ethyl (B): <5 mg/dL (ref <5)

## 2016-06-07 NOTE — Telephone Encounter (Signed)
Patients mother is calling and she said that patient has becoming erratic and she does not think he is taking his medications. They have called the mobile crisis and they are coming out to evaluate the patient. I did note that you had suggested injections to the patient at his last visit. Patients mother would like to know if that is still an option. Please review and advise, thank you

## 2016-06-07 NOTE — BH Assessment (Signed)
Attempted to contact the pt's mother in order to obtain collateral information but I did not get an answer. HIPAA compliant voicemail was left.  Lind Covert, LCSWA TTS Specialist

## 2016-06-07 NOTE — Telephone Encounter (Signed)
Yes injectable is option

## 2016-06-07 NOTE — ED Notes (Signed)
Bed: WA27 Expected date:  Expected time:  Means of arrival:  Comments: Georganna Skeans

## 2016-06-07 NOTE — ED Triage Notes (Signed)
Pt brought in for "erratic behavior". Attempt to triage pt however pt is continuously talking to himself and laughing incoherently. Pt does state he hears voices. Pt denies SI/HI at this time.

## 2016-06-07 NOTE — BH Assessment (Addendum)
Tele Assessment Note   [name] [name]. is an 51 y.o. male who presents to the ED voluntarily. Pt appeared erratic in the ED per chart. Pt was speaking incoherently during the assessment in a slurred and intangible speech pattern. Pt stated "mom put me in hospital. She trust hospital more." Pt was asked about AVH and he stated "everybody would believe that." Pt has hx of into hospitalizations due to Schizophrenia and reports he has a therapist but did not disclose who he sees for OPT. Pt was asked if he has access to weapons and he stated "I can't tell anybody what my business is" and began laughing. Pt continued laughing throughout the assessment and appeared to be responding to internal stimuli.    This assessor attempted to contact the pt's mother who is his legal guardian in order to obtain collateral information but attempts to contact the pt's mother were unsuccessful. Per chart, pt continues to speak incoherently while in the ED and is not making sense in his speech.    Per [name], PA pt meets criteria for inpt treatment. Pt's legal guardian needs to be contacted to obtain collateral information.   Diagnosis: Schizophrenia   Past Medical History:  Past Medical History:  Diagnosis Date   Diabetes mellitus type II, uncontrolled ([place])    HTN (hypertension)    Hyperlipidemia LDL goal <70    Schizophrenia ([place])     History reviewed. No pertinent surgical history.  Family History:  Family History  Problem Relation Age of Onset   Breast cancer Mother    Diabetes Mother    Prostate cancer Father     StepFather   Hypertension Father    Breast cancer Maternal Aunt    Diabetes Maternal Uncle     Social History:  reports that he has been smoking Cigarettes and Cigars.  He has a 30.00 pack-year smoking history. He has never used smokeless tobacco. He reports that he uses drugs, including Marijuana. He reports that he does not drink alcohol.  Additional Social  History:  Alcohol / Drug Use Pain Medications: See PTA meds Prescriptions: See PTA meds Over the Counter: See PTA meds History of alcohol / drug use?: Yes Substance #1 Name of Substance 1: Cannabis  1 - Age of First Use: pt denies use, labs show positive  1 - Last Use / Amount: unknown  CIWA: CIWA-Ar BP: 130/75 Pulse Rate: (!) 103 COWS:    PATIENT STRENGTHS: (choose at least two) Child psychotherapist Supportive family/friends  Allergies: No Known Allergies  Home Medications:  (Not in a hospital admission)  OB/GYN Status:  No LMP for male patient.  General Assessment Data [facility] of Assessment: [facility] TTS Assessment: In system Is this a Tele or Face-to-Face Assessment?: Tele Assessment Is this an Initial Assessment or a Re-assessment for this encounter?: Initial Assessment Marital status: Single Is patient pregnant?: No Pregnancy Status: No Living Arrangements: Parent Can pt return to current living arrangement?: Yes Admission Status: Voluntary Is patient capable of signing voluntary admission?: Yes Referral Source: Self/Family/Friend Insurance type: [facility] Medicare     Crisis Care Plan Living Arrangements: Parent Legal Guardian: Mother [place], [place]  ) Name of Psychiatrist: unknown Name of Therapist: unknown  Education Status Is patient currently in school?: No Highest grade of school patient has completed: 11th  Risk to self with the past 6 months Suicidal Ideation: No Has patient been a risk to self within the past 6 months prior to admission? : No Suicidal  Intent: No Has patient had any suicidal intent within the past 6 months prior to admission? : No Is patient at risk for suicide?: No Suicidal Plan?: No Has patient had any suicidal plan within the past 6 months prior to admission? : No Access to Means: No What has been your use of drugs/alcohol within the last 12 months?: denies use, labs show positive for marijuana   Previous Attempts/Gestures: No Triggers for Past Attempts: None known Intentional Self Injurious Behavior: None Family Suicide History: No Recent stressful life event(s): Other (Comment) (none reported ) Persecutory voices/beliefs?: No Depression: No Depression Symptoms: Insomnia Substance abuse history and/or treatment for substance abuse?: No Suicide prevention information given to non-admitted patients: Not applicable  Risk to Others within the past 6 months Homicidal Ideation: No Does patient have any lifetime risk of violence toward others beyond the six months prior to admission? : No Thoughts of Harm to Others: No Current Homicidal Intent: No Current Homicidal Plan: No Access to Homicidal Means: No History of harm to others?: No Assessment of Violence: None Noted Does patient have access to weapons?: No Criminal Charges Pending?: No Does patient have a court date: No Is patient on probation?: No  Psychosis Hallucinations: Auditory, Visual (per chart pt has been talking to himself and erratic ) Delusions: Unspecified  Mental Status Report Appearance/Hygiene: Disheveled, Bizarre Eye Contact: Fair Motor Activity: Freedom of movement Speech: Incoherent, Slurred Level of Consciousness: Alert Mood: Anxious Affect: Anxious Anxiety Level: Moderate Thought Processes: Tangential, Flight of Ideas Judgement: Impaired Orientation: Not oriented Obsessive Compulsive Thoughts/Behaviors: None  Cognitive Functioning Concentration: Poor Memory: Remote Impaired, Recent Impaired IQ: Average Insight: Poor Impulse Control: Poor Appetite: Good Sleep: Decreased Total Hours of Sleep: 2 Vegetative Symptoms: None  ADLScreening [facility] Assessment Services) Patient's cognitive ability adequate to safely complete daily activities?: Yes Patient able to express need for assistance with ADLs?: Yes Independently performs ADLs?: Yes (appropriate for developmental age)  Prior Inpatient  Therapy Prior Inpatient Therapy: Yes Prior Therapy Dates: multiple Prior Therapy Facilty/Provider(s): [facility], [facility], and others Reason for Treatment: Schizophrenia  Prior Outpatient Therapy Prior Outpatient Therapy: Yes Prior Therapy Dates: current Prior Therapy Facilty/Provider(s): unknown Reason for Treatment: Schizophrenia  Does patient have an ACCT team?: Unknown Does patient have Intensive In-House Services?  : Unknown Does patient have [facility] services? : Unknown Does patient have P4CC services?: Unknown  ADL Screening (condition at time of admission) Patient's cognitive ability adequate to safely complete daily activities?: Yes Is the patient deaf or have difficulty hearing?: No Does the patient have difficulty seeing, even when wearing glasses/contacts?: No Does the patient have difficulty concentrating, remembering, or making decisions?: Yes Patient able to express need for assistance with ADLs?: Yes Does the patient have difficulty dressing or bathing?: No Independently performs ADLs?: Yes (appropriate for developmental age) Does the patient have difficulty walking or climbing stairs?: No Weakness of Legs: None Weakness of Arms/Hands: None  Home Assistive Devices/Equipment Home Assistive Devices/Equipment: None    Abuse/Neglect Assessment (Assessment to be complete while patient is alone) Physical Abuse: Denies Verbal Abuse: Denies Sexual Abuse: Denies Exploitation of patient/patient's resources: Denies Self-Neglect: Denies     Regulatory affairs officer (For Healthcare) Does Patient Have a Medical Advance Directive?: Yes Type of Advance Directive: Healthcare Power of Attorney (pt's mother Ervin, [place]  )    Additional Information 1:1 In Past 12 Months?: No CIRT Risk: No Elopement Risk: No Does patient have medical clearance?: Yes     Disposition:  Disposition Initial Assessment Completed for  this Encounter: Yes Disposition of Patient: Inpatient  treatment program Type of inpatient treatment program: Adult (per [name], PA)  Lyanne Co 06/07/2016 11:06 PM

## 2016-06-07 NOTE — ED Notes (Signed)
Patient educated about search process and term "contraband " and routine search performed. No contraband found. 

## 2016-06-07 NOTE — ED Notes (Signed)
Mother/ Pts legal guardian at bedside. Mother reports these symptoms began x4 days ago. Per mother Last month pt was seen for similar symptoms and sent to [facility] for a few days before being discharged. Mother unsure of diagnosis/treatment from previous hospitalization.

## 2016-06-07 NOTE — ED Notes (Signed)
Patient denies SI,HI and AVH. Patient noted to in room talking loudly to himself. Will continue to observe patient. Encouragement and support provided and safety maintain. Q 15 min safety check in place and video monitoring.

## 2016-06-07 NOTE — ED Provider Notes (Signed)
[facility] DEPT Provider Note   CSN: [id number] Arrival date & time: 06/07/16  1714     History   Chief Complaint Chief Complaint  Patient presents with  . Psychiatric Evaluation    HPI [name] [name]. is a 51 y.o. male.  HPI level 5 caveat due to psychosis.   51 year old male presents today with acute psychosis.  I was called to evaluate the patient in the emergency room as he was not making any sense.  Patient cannot put together a coherent sentence, he is clear and coherent with his pronunciation, but continues to ramble on with no linear thought.  She would not respond directly to any questioning.  Appear to be in no acute distress.  Past Medical History:  Diagnosis Date  . Diabetes mellitus type II, uncontrolled ([place])   . HTN (hypertension)   . Hyperlipidemia LDL goal <70   . Schizophrenia [facility])     Patient Active Problem List   Diagnosis Date Noted  . DM (diabetes mellitus) type II uncontrolled, periph vascular disorder ([place]) 02/29/2016  . Schizophrenia ([place]) 06/02/2015  . Hyperlipidemia 09/23/2014  . Renal insufficiency 09/23/2014  . HTN (hypertension) 03/22/2013  . Seasonal allergies 03/22/2013    History reviewed. No pertinent surgical history.     Home Medications    Prior to Admission medications   Medication Sig Start Date End Date Taking? Authorizing Provider  aspirin EC 81 MG tablet Take 1 tablet (81 mg total) by mouth daily. Patient taking differently: Take 81 mg by mouth every morning.  01/21/16  Yes Debbrah Alar, NP  loratadine (CLARITIN) 10 MG tablet Take 1 tablet (10 mg total) by mouth daily as needed. Patient taking differently: Take 10 mg by mouth every morning.  01/27/15  Yes Yvonne R Lowne Chase, DO  metoprolol tartrate (LOPRESSOR) 25 MG tablet TAKE 1 TABLET (25 MG TOTAL) BY MOUTH 2 (TWO) TIMES DAILY. 05/03/16  Yes Yvonne R Lowne Chase, DO  mirtazapine (REMERON) 30 MG tablet Take 1 tablet (30 mg total) by mouth at bedtime.  05/05/16  Yes Kathlee Nations, MD  risperiDONE (RISPERDAL) 2 MG tablet Take 1 tab in am and 2 at bed time Patient taking differently: Take 2-4 mg by mouth 2 (two) times daily. Take 1 tab in am and 2 at bed time 05/05/16  Yes Kathlee Nations, MD  simvastatin (ZOCOR) 40 MG tablet Take 1 tablet (40 mg total) by mouth daily. Office visit due now Patient taking differently: Take 40 mg by mouth every morning. Office visit due now 05/25/16  Yes Rosalita Chessman Chase, DO  traZODone (DESYREL) 50 MG tablet Take 1 tablet (50 mg total) by mouth at bedtime. 05/05/16  Yes Kathlee Nations, MD    Family History Family History  Problem Relation Age of Onset  . Breast cancer Mother   . Diabetes Mother   . Prostate cancer Father     StepFather  . Hypertension Father   . Breast cancer Maternal Aunt   . Diabetes Maternal Uncle     Social History Social History  Substance Use Topics  . Smoking status: Current Every Day Smoker    Packs/day: 1.00    Years: 30.00    Types: Cigarettes, Cigars  . Smokeless tobacco: Never Used     Comment: cigarettes-1 pack every couple of days, cigars-pack a day  . Alcohol use No     Comment: Socially / not often     Allergies   Patient has no known allergies.  Review of Systems Review of Systems  All other systems reviewed and are negative.    Physical Exam Updated Vital Signs BP 130/75 (BP Location: Left Arm)   Pulse (!) 103   Temp 98.9 F (37.2 C) (Oral)   Resp 19   Ht 5\' 9"  (1.753 m)   Wt 107.5 kg   SpO2 96%   BMI 35.00 kg/m   Physical Exam  Constitutional: He appears well-developed and well-nourished.  HENT:  Head: Normocephalic and atraumatic.  Eyes: Conjunctivae are normal. Pupils are equal, round, and reactive to light. Right eye exhibits no discharge. Left eye exhibits no discharge. No scleral icterus.  Neck: Normal range of motion. No JVD present. No tracheal deviation present.  Pulmonary/Chest: Effort normal. No stridor.  Neurological: He is  alert. Coordination normal.  Psychiatric: He has a normal mood and affect. His behavior is normal. Judgment and thought content normal.  Nursing note and vitals reviewed.    ED Treatments / Results  Labs (all labs ordered are listed, but only abnormal results are displayed) Labs Reviewed  COMPREHENSIVE METABOLIC PANEL - Abnormal; Notable for the following:       Result Value   Sodium 134 (*)    CO2 17 (*)    Creatinine, Ser 1.90 (*)    GFR calc non Af Amer 40 (*)    GFR calc Af Amer 47 (*)    All other components within normal limits  ACETAMINOPHEN LEVEL - Abnormal; Notable for the following:    Acetaminophen (Tylenol), Serum <10 (*)    All other components within normal limits  CBC - Abnormal; Notable for the following:    WBC 11.5 (*)    Platelets 473 (*)    All other components within normal limits  RAPID URINE DRUG SCREEN, HOSP PERFORMED - Abnormal; Notable for the following:    Tetrahydrocannabinol POSITIVE (*)    All other components within normal limits  ETHANOL  SALICYLATE LEVEL    EKG  EKG Interpretation None       Radiology No results found.  Procedures Procedures (including critical care time)  Medications Ordered in ED Medications - No data to display   Initial Impression / Assessment and Plan / ED Course  I have reviewed the triage vital signs and the nursing notes.  Pertinent labs & imaging results that were available during my care of the patient were reviewed by me and considered in my medical decision making (see chart for details).      Final Clinical Impressions(s) / ED Diagnoses   Final diagnoses:  Psychosis, unspecified psychosis type    Labs:   Imaging:  Consults:  Therapeutics:  Discharge Meds:   Assessment/Plan: 51 year old male presents today with psychosis.  I am unable to give any relevant information from him as his questioning turns into random rambling.  He is not in any acute distress, eating and drinking his meal  throughout my evaluation.  Patient is medically cleared awaiting TTS consultation.      New Prescriptions New Prescriptions   No medications on file     Okey Regal, PA-C 06/07/16 2329    Charlesetta Shanks, MD 06/23/16 [phone]

## 2016-06-07 NOTE — BH Assessment (Signed)
Spoke with Leatha Gilding, RN to set up telepsych assessment. Called cart in order to complete the TTS assessment via telepsych but the cart continued to ring without being answered. Will attempt to call again.  Lind Covert, LCSWA TTS Specialist

## 2016-06-08 ENCOUNTER — Ambulatory Visit (HOSPITAL_COMMUNITY): Admission: AD | Admit: 2016-06-08 | Payer: Medicare HMO | Source: Intra-hospital | Admitting: Psychiatry

## 2016-06-08 DIAGNOSIS — F17210 Nicotine dependence, cigarettes, uncomplicated: Secondary | ICD-10-CM | POA: Diagnosis not present

## 2016-06-08 DIAGNOSIS — F1290 Cannabis use, unspecified, uncomplicated: Secondary | ICD-10-CM | POA: Diagnosis not present

## 2016-06-08 DIAGNOSIS — F201 Disorganized schizophrenia: Secondary | ICD-10-CM | POA: Diagnosis not present

## 2016-06-08 MED ORDER — SIMVASTATIN 40 MG PO TABS
40.0000 mg | ORAL_TABLET | Freq: Every day | ORAL | Status: DC
  Administered 2016-06-08: 40 mg via ORAL
  Filled 2016-06-08: qty 1

## 2016-06-08 MED ORDER — ASPIRIN EC 81 MG PO TBEC
81.0000 mg | DELAYED_RELEASE_TABLET | Freq: Every day | ORAL | Status: DC
  Administered 2016-06-08: 81 mg via ORAL
  Filled 2016-06-08: qty 1

## 2016-06-08 MED ORDER — RISPERIDONE 2 MG PO TABS
2.0000 mg | ORAL_TABLET | Freq: Every day | ORAL | Status: DC
  Administered 2016-06-08: 2 mg via ORAL
  Filled 2016-06-08: qty 1

## 2016-06-08 MED ORDER — TRAZODONE HCL 50 MG PO TABS: 50.0000 mg | ORAL_TABLET | Freq: Every day | ORAL | Status: DC

## 2016-06-08 MED ORDER — METOPROLOL TARTRATE 25 MG PO TABS
25.0000 mg | ORAL_TABLET | Freq: Two times a day (BID) | ORAL | Status: DC
  Administered 2016-06-08: 25 mg via ORAL
  Filled 2016-06-08: qty 1

## 2016-06-08 MED ORDER — LORATADINE 10 MG PO TABS
10.0000 mg | ORAL_TABLET | Freq: Every day | ORAL | Status: DC | PRN
  Filled 2016-06-08: qty 1

## 2016-06-08 MED ORDER — NICOTINE POLACRILEX 2 MG MT GUM
2.0000 mg | CHEWING_GUM | OROMUCOSAL | Status: DC | PRN
  Administered 2016-06-08: 2 mg via ORAL
  Filled 2016-06-08: qty 1

## 2016-06-08 MED ORDER — RISPERIDONE 2 MG PO TABS: 2.0000 mg | ORAL_TABLET | Freq: Two times a day (BID) | ORAL | Status: DC

## 2016-06-08 MED ORDER — AMANTADINE HCL 100 MG PO CAPS
100.0000 mg | ORAL_CAPSULE | Freq: Two times a day (BID) | ORAL | Status: DC
  Administered 2016-06-08: 100 mg via ORAL
  Filled 2016-06-08: qty 1

## 2016-06-08 MED ORDER — RISPERIDONE 2 MG PO TABS: 4.0000 mg | ORAL_TABLET | Freq: Every day | ORAL | Status: DC

## 2016-06-08 NOTE — Discharge Instructions (Addendum)
For your ongoing behavioral health needs, you are advised to continue treatment with Berniece Andreas, MD, at the [facility] [place].  Your next appointment is scheduled for Tuesday, Aug 03, 2016 at 2:45 pm:       Berniece Andreas, MD       Clinic at [facility]. [address] [place], [place] [address]      [phone]

## 2016-06-08 NOTE — BH Assessment (Signed)
[name] [name] Assessment Progress Note  Per Corena Pilgrim, MD, this pt would benefit from psychiatric hospitalization at this time.  [name], RN, [facility] has assigned pt to [facility] Rm 508-1.  Pt is under voluntary status, however, and has refused to sign Voluntary Admission and Consent for Treatment, saying that he wants to be discharged from [facility].  This Probation officer discussed this with Waylan Boga, DNP, who recommends that I call pt's mother to obtain collateral information.  At 13:45 I called [name] at [phone].  [name] reports that pt was living at [facility] until it closed down a little over a year ago.  Since that time, he has been living with her.  She reports that she has health problems, and at first she appreciated having him in the household to help out.  As time has passed, however, he has started refusing to follow directions, such as helping with household chores.  He smokes tobacco continuously, and also drinks a lot of coffee and sodas.  I clarified with her that she is not his guardian, nor is she his healthcare power of attorney.  She is, however, his durable power of attorney and his payee for disability benefits.  Recently, pt's former roommate at [facility], whom the mother points out is gay, called the home, and the pt subsequently referred to him as a "bitch."  However, Ms Shonka denies that the pt has made any recent suicidal threats.  He has not engaged in self-mutilation or in parasuicidal behavior.  There are no firearms in the household, but the pt recently bought a gun enthusiast periodical.  Ms Rorie denies that the pt has made any recent homicidal threats.  He has not been combative with anyone, and he has not engaged in property destructions.  She has known pt to laugh for no apparent reason recently, and he will not tell her why, saying that it would be inappropriate to discuss it with his mother.  He has reported experiencing a physical presence that he identifies  as Jesus.  He has reported experiencing command hallucinations, but will not divulge the nature of the commands to her.  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  After staffing these details with Theodoro Clock, it has been determined that pt does not meet criteria for IVC.  He is to be discharged from [facility] with recommendation to continue treatment with Berniece Andreas, MD at the [facility].  His next appointment is scheduled for Tuesday, 08/03/2016 at 14:45.  This has been included in pt's discharge instructions.  Pt's mother reports that she will come to the ED around 17:00 to pick the pt up.  Jalene Mullet, [place] Triage Specialist [place], [place] Triage Specialist [phone]

## 2016-06-08 NOTE — Consult Note (Signed)
[facility] Consult   Reason for Consult: psychiatric evaluation Referring Physician:  EDP Patient Identification: [name] [name]. MRN:  [id number] Principal Diagnosis: Schizophrenia, disorganized type [facility]) Diagnosis:   Patient Active Problem List   Diagnosis Date Noted  . Schizophrenia, disorganized type ([place]) [F20.1] 06/02/2015    Priority: High  . DM (diabetes mellitus) type II uncontrolled, periph vascular disorder ([place]) [E11.51, E11.65] 02/29/2016  . Hyperlipidemia [E78.5] 09/23/2014  . Renal insufficiency [N28.9] 09/23/2014  . HTN (hypertension) [I10] 03/22/2013  . Seasonal allergies [J30.2] 03/22/2013    Total Time spent with patient: 45 minutes  Subjective:   [name] [name]. is a 51 y.o. male patient admitted with bizarre behavior.  HPI:  Patient with history of Schizophrenia who was brought to [facility] by his family for psychiatric evaluation. Patient lives with his mother who reports that he has been behaving bizarre and erratic. Patient has been observed laughing and talking to himself as if responding to internal stimuli. Patient reports hearing voices but denies visual hallucination.   Past Psychiatric History: as above  Risk to Self: Suicidal Ideation: No Suicidal Intent: No Is patient at risk for suicide?: No Suicidal Plan?: No Access to Means: No What has been your use of drugs/alcohol within the last 12 months?: denies use, labs show positive for marijuana  Triggers for Past Attempts: None known Intentional Self Injurious Behavior: None Risk to Others: Homicidal Ideation: No Thoughts of Harm to Others: No Current Homicidal Intent: No Current Homicidal Plan: No Access to Homicidal Means: No History of harm to others?: No Assessment of Violence: None Noted Does patient have access to weapons?: No Criminal Charges Pending?: No Does patient have a court date: No Prior Inpatient Therapy: Prior Inpatient Therapy: Yes Prior  Therapy Dates: multiple Prior Therapy Facilty/Provider(s): [place], [facility], and others Reason for Treatment: Schizophrenia Prior Outpatient Therapy: Prior Outpatient Therapy: Yes Prior Therapy Dates: current Prior Therapy Facilty/Provider(s): unknown Reason for Treatment: Schizophrenia  Does patient have an ACCT team?: Unknown Does patient have Intensive In-House Services?  : Unknown Does patient have [facility] services? : Unknown Does patient have P4CC services?: Unknown  Past Medical History:  Past Medical History:  Diagnosis Date  . Diabetes mellitus type II, uncontrolled ([place])   . HTN (hypertension)   . Hyperlipidemia LDL goal <70   . Schizophrenia ([place])    History reviewed. No pertinent surgical history. Family History:  Family History  Problem Relation Age of Onset  . Breast cancer Mother   . Diabetes Mother   . Prostate cancer Father     StepFather  . Hypertension Father   . Breast cancer Maternal Aunt   . Diabetes Maternal Uncle    Family Psychiatric  History: Social History:  History  Alcohol Use No    Comment: Socially / not often     History  Drug Use  . Types: Marijuana    Comment: occasional    Social History   Social History  . Marital status: Single    Spouse name: N/A  . Number of children: N/A  . Years of education: N/A   Social History Main Topics  . Smoking status: Current Every Day Smoker    Packs/day: 1.00    Years: 30.00    Types: Cigarettes, Cigars  . Smokeless tobacco: Never Used     Comment: cigarettes-1 pack every couple of days, cigars-pack a day  . Alcohol use No     Comment: Socially / not often  . Drug use: Yes  Types: Marijuana     Comment: occasional  . Sexual activity: No   Other Topics Concern  . None   Social History Narrative   Lives with mother   unemployed   No children   Additional Social History:    Allergies:  No Known Allergies  Labs:  Results for orders placed or performed during the hospital  encounter of 06/07/16 (from the past 48 hour(s))  Comprehensive metabolic panel     Status: Abnormal   Collection Time: 06/07/16  6:34 PM  Result Value Ref Range   Sodium 134 (L) 135 - 145 mmol/L   Potassium 3.7 3.5 - 5.1 mmol/L   Chloride 104 101 - 111 mmol/L   CO2 17 (L) 22 - 32 mmol/L   Glucose, Bld 88 65 - 99 mg/dL   BUN 20 6 - 20 mg/dL   Creatinine, Ser 1.90 (H) 0.61 - 1.24 mg/dL   Calcium 9.6 8.9 - 10.3 mg/dL   Total Protein 8.1 6.5 - 8.1 g/dL   Albumin 4.4 3.5 - 5.0 g/dL   AST 34 15 - 41 U/L   ALT 42 17 - 63 U/L   Alkaline Phosphatase 58 38 - 126 U/L   Total Bilirubin 1.1 0.3 - 1.2 mg/dL   GFR calc non Af Amer 40 (L) >60 mL/min   GFR calc Af Amer 47 (L) >60 mL/min    Comment: (NOTE) The eGFR has been calculated using the CKD EPI equation. This calculation has not been validated in all clinical situations. eGFR's persistently <60 mL/min signify possible Chronic Kidney Disease.    Anion gap 13 5 - 15  Ethanol     Status: None   Collection Time: 06/07/16  6:34 PM  Result Value Ref Range   Alcohol, Ethyl (B) <5 <5 mg/dL    Comment:        LOWEST DETECTABLE LIMIT FOR SERUM ALCOHOL IS 5 mg/dL FOR MEDICAL PURPOSES ONLY   Salicylate level     Status: None   Collection Time: 06/07/16  6:34 PM  Result Value Ref Range   Salicylate Lvl <2.6 2.8 - 30.0 mg/dL  Acetaminophen level     Status: Abnormal   Collection Time: 06/07/16  6:34 PM  Result Value Ref Range   Acetaminophen (Tylenol), Serum <10 (L) 10 - 30 ug/mL    Comment:        THERAPEUTIC CONCENTRATIONS VARY SIGNIFICANTLY. A RANGE OF 10-30 ug/mL MAY BE AN EFFECTIVE CONCENTRATION FOR MANY PATIENTS. HOWEVER, SOME ARE BEST TREATED AT CONCENTRATIONS OUTSIDE THIS RANGE. ACETAMINOPHEN CONCENTRATIONS >150 ug/mL AT 4 HOURS AFTER INGESTION AND >50 ug/mL AT 12 HOURS AFTER INGESTION ARE OFTEN ASSOCIATED WITH TOXIC REACTIONS.   cbc     Status: Abnormal   Collection Time: 06/07/16  6:34 PM  Result Value Ref Range   WBC  11.5 (H) 4.0 - 10.5 K/uL   RBC 5.24 4.22 - 5.81 MIL/uL   Hemoglobin 15.3 13.0 - 17.0 g/dL   HCT 43.9 39.0 - 52.0 %   MCV 83.8 78.0 - 100.0 fL   MCH 29.2 26.0 - 34.0 pg   MCHC 34.9 30.0 - 36.0 g/dL   RDW 13.9 11.5 - 15.5 %   Platelets 473 (H) 150 - 400 K/uL  Rapid urine drug screen (hospital performed)     Status: Abnormal   Collection Time: 06/07/16  9:35 PM  Result Value Ref Range   Opiates NONE DETECTED NONE DETECTED   Cocaine NONE DETECTED NONE DETECTED   Benzodiazepines NONE DETECTED  NONE DETECTED   Amphetamines NONE DETECTED NONE DETECTED   Tetrahydrocannabinol POSITIVE (A) NONE DETECTED   Barbiturates NONE DETECTED NONE DETECTED    Comment:        DRUG SCREEN FOR MEDICAL PURPOSES ONLY.  IF CONFIRMATION IS NEEDED FOR ANY PURPOSE, NOTIFY LAB WITHIN 5 DAYS.        LOWEST DETECTABLE LIMITS FOR URINE DRUG SCREEN Drug Class       Cutoff (ng/mL) Amphetamine      1000 Barbiturate      200 Benzodiazepine   258 Tricyclics       527 Opiates          300 Cocaine          300 THC              50     Current Facility-Administered Medications  Medication Dose Route Frequency Provider Last Rate Last Dose  . amantadine (SYMMETREL) capsule 100 mg  100 mg Oral BID Corena Pilgrim, MD      . aspirin EC tablet 81 mg  81 mg Oral Daily Cornell Bourbon, MD      . loratadine (CLARITIN) tablet 10 mg  10 mg Oral Daily PRN Corena Pilgrim, MD      . metoprolol tartrate (LOPRESSOR) tablet 25 mg  25 mg Oral BID Mana Morison, MD      . risperiDONE (RISPERDAL) tablet 2 mg  2 mg Oral BID Lynn Recendiz, MD      . simvastatin (ZOCOR) tablet 40 mg  40 mg Oral Daily Ardian Haberland, MD      . traZODone (DESYREL) tablet 50 mg  50 mg Oral QHS Corena Pilgrim, MD       Current Outpatient Prescriptions  Medication Sig Dispense Refill  . aspirin EC 81 MG tablet Take 1 tablet (81 mg total) by mouth daily. (Patient taking differently: Take 81 mg by mouth every morning. )    . loratadine (CLARITIN)  10 MG tablet Take 1 tablet (10 mg total) by mouth daily as needed. (Patient taking differently: Take 10 mg by mouth every morning. ) 90 tablet 1  . metoprolol tartrate (LOPRESSOR) 25 MG tablet TAKE 1 TABLET (25 MG TOTAL) BY MOUTH 2 (TWO) TIMES DAILY. 180 tablet 1  . mirtazapine (REMERON) 30 MG tablet Take 1 tablet (30 mg total) by mouth at bedtime. 30 tablet 2  . risperiDONE (RISPERDAL) 2 MG tablet Take 1 tab in am and 2 at bed time (Patient taking differently: Take 2-4 mg by mouth 2 (two) times daily. Take 1 tab in am and 2 at bed time) 90 tablet 2  . simvastatin (ZOCOR) 40 MG tablet Take 1 tablet (40 mg total) by mouth daily. Office visit due now (Patient taking differently: Take 40 mg by mouth every morning. Office visit due now) 90 tablet 1  . traZODone (DESYREL) 50 MG tablet Take 1 tablet (50 mg total) by mouth at bedtime. 30 tablet 2    Musculoskeletal: Strength & Muscle Tone: within normal limits Gait & Station: normal Patient leans: N/A  Psychiatric Specialty Exam: Physical Exam  Psychiatric: His mood appears anxious. His speech is tangential. He is actively hallucinating. Thought content is paranoid and delusional. Cognition and memory are normal. He expresses impulsivity.    Review of Systems  Constitutional: Negative.   HENT: Negative.   Eyes: Negative.   Respiratory: Negative.   Cardiovascular: Negative.   Gastrointestinal: Negative.   Genitourinary: Negative.   Musculoskeletal: Negative.   Skin: Negative.  Neurological: Negative.   Endo/Heme/Allergies: Negative.   Psychiatric/Behavioral: Positive for hallucinations and substance abuse. The patient is nervous/anxious.     Blood pressure 133/84, pulse 90, temperature 98.9 F (37.2 C), temperature source Oral, resp. rate 18, height 5' 9"  (1.753 m), weight 107.5 kg (237 lb), SpO2 96 %.Body mass index is 35 kg/m.  General Appearance: Disheveled  Eye Contact:  Good  Speech:  Blocked  Volume:  Normal  Mood:  Anxious   Affect:  Blunt  Thought Process:  Disorganized  Orientation:  Full (Time, Place, and Person)  Thought Content:  Illogical  Suicidal Thoughts:  No  Homicidal Thoughts:  No  Memory:  Immediate;   Fair Recent;   Fair Remote;   Fair  Judgement:  Poor  Insight:  Shallow  Psychomotor Activity:  Psychomotor Retardation  Concentration:  Concentration: Poor and Attention Span: Poor  Recall:   of Knowledge:  Fair  Language:  Good  Akathisia:  No  Handed:  Right  AIMS (if indicated):     Assets:  Communication Skills Desire for Improvement Social Support  ADL's:  Intact  Cognition:  WNL  Sleep:   poor     Treatment Plan Summary: Daily contact with patient to assess and evaluate symptoms and progress in treatment and Medication management  Re-start Risperdal 2 mg bid for schizophrenia Add Amantadine 100 mg bid for EPS therapy. Continue Trazodone 50 mg Qhs for Insomnia  Disposition: Recommend psychiatric Inpatient admission when medically cleared.  Corena Pilgrim, MD 06/08/2016 11:21 AM

## 2016-06-08 NOTE — [facility] Suicide Risk Assessment (Signed)
Suicide Risk Assessment  Discharge Assessment   [facility] Discharge Suicide Risk Assessment   Principal Problem: Schizophrenia, disorganized type [facility]) Discharge Diagnoses:  Patient Active Problem List   Diagnosis Date Noted  . Schizophrenia, disorganized type ([place]) [F20.1] 06/02/2015    Priority: High  . DM (diabetes mellitus) type II uncontrolled, periph vascular disorder ([place]) [E11.51, E11.65] 02/29/2016  . Hyperlipidemia [E78.5] 09/23/2014  . Renal insufficiency [N28.9] 09/23/2014  . HTN (hypertension) [I10] 03/22/2013  . Seasonal allergies [J30.2] 03/22/2013    Total Time spent with patient: 45 minutes  Musculoskeletal: Strength & Muscle Tone: within normal limits Gait & Station: normal Patient leans: N/A  Psychiatric Specialty Exam:   Blood pressure 127/89, pulse (!) 109, temperature 99.1 F (37.3 C), temperature source Oral, resp. rate 18, height 5\' 9"  (1.753 m), weight 107.5 kg (237 lb), SpO2 97 %.Body mass index is 35 kg/m.  General Appearance: Casual  Eye Contact::  Good  Speech:  Normal Rate409  Volume:  Normal  Mood:  Euthymic  Affect:  Congruent  Thought Process:  Coherent and Descriptions of Associations: Intact  Orientation:  Full (Time, Place, and Person)  Thought Content:  WDL and Logical  Suicidal Thoughts:  No  Homicidal Thoughts:  No  Memory:  Immediate;   Good Recent;   Good Remote;   Good  Judgement:  Good  Insight:  Good  Psychomotor Activity:  Normal  Concentration:  Good  Recall:  Good  Fund of Knowledge:Fair  Language: Good  Akathisia:  No  Handed:  Right  AIMS (if indicated):     Assets:  Housing Leisure Time Physical Health Resilience  Sleep:     Cognition: WNL  ADL's:  Intact   Mental Status Per Nursing Assessment::   On Admission:   bizarre and erratic behaviors, calm and cooperative since assessment.  No suicidal/homicidal ideations, hallucinations, or alcohol/drug abuse.  Demographic Factors:  Male  Loss  Factors: NA  Historical Factors: NA  Risk Reduction Factors:   Sense of responsibility to family, Living with another person, especially a relative, Positive social support and Positive therapeutic relationship  Continued Clinical Symptoms:  NOne  Cognitive Features That Contribute To Risk:  None    Suicide Risk:  Minimal: No identifiable suicidal ideation.  Patients presenting with no risk factors but with morbid ruminations; may be classified as minimal risk based on the severity of the depressive symptoms    Plan Of Care/Follow-up recommendations:  Activity:  as tolerated Diet:  heart healthy diet  [name] Mitchum, NP 06/08/2016, 1:59 PM

## 2016-06-08 NOTE — Progress Notes (Signed)
06/08/16 1350:  LRT went to pt room to offer activities, pt was sleep.  Victorino Sparrow, LRT/CTRS

## 2016-06-08 NOTE — ED Notes (Signed)
Introduced self to patient. Pt oriented to unit expectations.  Assessed pt for:  A) Anxiety &/or agitation: Pt is calm and cooperative. He denies SI/HI and AVH. He said that he hears voices as baseline, but "I do not have any voices in my head telling me to do things."   S) Safety: Safety maintained with q-15-minute checks and hourly rounds by staff.  A) ADLs: Pt able to perform ADLs independently.  P) Pick-Up (room cleanliness): Pt's room clean and free of clutter.

## 2016-06-08 NOTE — ED Notes (Signed)
Pt discharged home. Discharged instructions read to pt who verbalized understanding. All belongings returned to pt who signed for same. Denies SI/HI, is not delusional and not responding to internal stimuli. Escorted pt to the ED exit. Pt picked up by his mother.

## 2016-06-08 NOTE — Telephone Encounter (Signed)
I called and spoke with patients mother this morning. Patient went to the ED last night in psychosis (see ED notes) she would like to start him on an injectable - she said she would make sure he gets here for the injections. Please review and advise. Also, I called the ED to check on him and the nurse was inquiring about his I.Q. She said they needed that information. I do not see it in any of your notes and was wondering if you knew where that information could be found. I amy have to ask patients mother.

## 2016-06-09 ENCOUNTER — Emergency Department (HOSPITAL_COMMUNITY)
Admission: EM | Admit: 2016-06-09 | Discharge: 2016-06-11 | Disposition: A | Discharge status: Other Acute Inpatient Hospital | Payer: Medicare HMO | Attending: Emergency Medicine | Admitting: Emergency Medicine

## 2016-06-09 DIAGNOSIS — F250 Schizoaffective disorder, bipolar type: Secondary | ICD-10-CM | POA: Diagnosis present

## 2016-06-09 DIAGNOSIS — F17210 Nicotine dependence, cigarettes, uncomplicated: Secondary | ICD-10-CM | POA: Diagnosis not present

## 2016-06-09 DIAGNOSIS — I10 Essential (primary) hypertension: Secondary | ICD-10-CM | POA: Insufficient documentation

## 2016-06-09 DIAGNOSIS — E119 Type 2 diabetes mellitus without complications: Secondary | ICD-10-CM | POA: Diagnosis not present

## 2016-06-09 DIAGNOSIS — Z7982 Long term (current) use of aspirin: Secondary | ICD-10-CM | POA: Diagnosis not present

## 2016-06-09 DIAGNOSIS — F201 Disorganized schizophrenia: Secondary | ICD-10-CM | POA: Diagnosis present

## 2016-06-09 DIAGNOSIS — Z79899 Other long term (current) drug therapy: Secondary | ICD-10-CM | POA: Insufficient documentation

## 2016-06-09 DIAGNOSIS — R451 Restlessness and agitation: Secondary | ICD-10-CM | POA: Diagnosis present

## 2016-06-09 DIAGNOSIS — F29 Unspecified psychosis not due to a substance or known physiological condition: Secondary | ICD-10-CM | POA: Insufficient documentation

## 2016-06-09 DIAGNOSIS — Z008 Encounter for other general examination: Secondary | ICD-10-CM

## 2016-06-09 DIAGNOSIS — F1290 Cannabis use, unspecified, uncomplicated: Secondary | ICD-10-CM | POA: Diagnosis not present

## 2016-06-09 LAB — COMPREHENSIVE METABOLIC PANEL
ALBUMIN: 4.5 g/dL (ref 3.5–5.0)
ALK PHOS: 60 U/L (ref 38–126)
ALT: 42 U/L (ref 17–63)
AST: 35 U/L (ref 15–41)
Anion gap: 10 (ref 5–15)
BUN: 21 mg/dL — AB (ref 6–20)
CALCIUM: 9.4 mg/dL (ref 8.9–10.3)
CHLORIDE: 104 mmol/L (ref 101–111)
CO2: 19 mmol/L — AB (ref 22–32)
Creatinine, Ser: 1.83 mg/dL — ABNORMAL HIGH (ref 0.61–1.24)
GFR calc Af Amer: 49 mL/min — ABNORMAL LOW (ref >60)
GFR calc non Af Amer: 42 mL/min — ABNORMAL LOW (ref >60)
GLUCOSE: 119 mg/dL — AB (ref 65–99)
Potassium: 3.8 mmol/L (ref 3.5–5.1)
SODIUM: 133 mmol/L — AB (ref 135–145)
Total Bilirubin: 0.7 mg/dL (ref 0.3–1.2)
Total Protein: 7.8 g/dL (ref 6.5–8.1)

## 2016-06-09 LAB — CBC WITH DIFFERENTIAL/PLATELET
Basophils Absolute: 0 10*3/uL (ref 0.0–0.1)
Basophils Relative: 0 %
EOS ABS: 0.1 10*3/uL (ref 0.0–0.7)
EOS PCT: 1 %
HCT: 42.2 % (ref 39.0–52.0)
Hemoglobin: 14.5 g/dL (ref 13.0–17.0)
LYMPHS ABS: 1.4 10*3/uL (ref 0.7–4.0)
LYMPHS PCT: 12 %
MCH: 29 pg (ref 26.0–34.0)
MCHC: 34.4 g/dL (ref 30.0–36.0)
MCV: 84.4 fL (ref 78.0–100.0)
MONO ABS: 1.2 10*3/uL — AB (ref 0.1–1.0)
MONOS PCT: 10 %
Neutro Abs: 8.7 10*3/uL — ABNORMAL HIGH (ref 1.7–7.7)
Neutrophils Relative %: 77 %
PLATELETS: 488 10*3/uL — AB (ref 150–400)
RBC: 5 MIL/uL (ref 4.22–5.81)
RDW: 13.8 % (ref 11.5–15.5)
WBC: 11.4 10*3/uL — ABNORMAL HIGH (ref 4.0–10.5)

## 2016-06-09 LAB — RAPID URINE DRUG SCREEN, HOSP PERFORMED
AMPHETAMINES: NOT DETECTED
Barbiturates: NOT DETECTED
Benzodiazepines: NOT DETECTED
Cocaine: NOT DETECTED
Opiates: NOT DETECTED
Tetrahydrocannabinol: NOT DETECTED

## 2016-06-09 LAB — ETHANOL: Alcohol, Ethyl (B): <5 mg/dL (ref <5)

## 2016-06-09 MED ORDER — ALUM & MAG HYDROXIDE-SIMETH 200-200-20 MG/5ML PO SUSP: 30.0000 mL | ORAL | Status: DC | PRN

## 2016-06-09 MED ORDER — NICOTINE POLACRILEX 2 MG MT GUM
2.0000 mg | CHEWING_GUM | OROMUCOSAL | Status: DC | PRN
  Administered 2016-06-09 – 2016-06-10 (×4): 2 mg via ORAL
  Filled 2016-06-09 (×4): qty 1

## 2016-06-09 MED ORDER — LORAZEPAM 1 MG PO TABS
1.0000 mg | ORAL_TABLET | Freq: Three times a day (TID) | ORAL | Status: DC | PRN
  Administered 2016-06-09 – 2016-06-10 (×2): 1 mg via ORAL
  Filled 2016-06-09 (×2): qty 1

## 2016-06-09 MED ORDER — INSULIN ASPART 100 UNIT/ML ~~LOC~~ SOLN: 0.0000 [IU] | Freq: Every day | SUBCUTANEOUS | Status: DC

## 2016-06-09 MED ORDER — TRAZODONE HCL 50 MG PO TABS
50.0000 mg | ORAL_TABLET | Freq: Every day | ORAL | Status: DC
  Administered 2016-06-09: 50 mg via ORAL
  Filled 2016-06-09: qty 1

## 2016-06-09 MED ORDER — INSULIN ASPART 100 UNIT/ML ~~LOC~~ SOLN: 0.0000 [IU] | Freq: Three times a day (TID) | SUBCUTANEOUS | Status: DC

## 2016-06-09 MED ORDER — ZOLPIDEM TARTRATE 5 MG PO TABS: 5.0000 mg | ORAL_TABLET | Freq: Every evening | ORAL | Status: DC | PRN

## 2016-06-09 MED ORDER — IBUPROFEN 200 MG PO TABS: 600.0000 mg | ORAL_TABLET | Freq: Three times a day (TID) | ORAL | Status: DC | PRN

## 2016-06-09 MED ORDER — RISPERIDONE 2 MG PO TABS: 2.0000 mg | ORAL_TABLET | Freq: Two times a day (BID) | ORAL | Status: DC

## 2016-06-09 MED ORDER — INSULIN ASPART 100 UNIT/ML ~~LOC~~ SOLN
4.0000 [IU] | Freq: Three times a day (TID) | SUBCUTANEOUS | Status: DC
  Administered 2016-06-10 – 2016-06-11 (×3): 4 [IU] via SUBCUTANEOUS
  Filled 2016-06-09 (×3): qty 1

## 2016-06-09 MED ORDER — ACETAMINOPHEN 325 MG PO TABS: 650.0000 mg | ORAL_TABLET | ORAL | Status: DC | PRN

## 2016-06-09 MED ORDER — RISPERIDONE 2 MG PO TABS
2.0000 mg | ORAL_TABLET | Freq: Two times a day (BID) | ORAL | Status: DC
  Administered 2016-06-09 – 2016-06-11 (×5): 2 mg via ORAL
  Filled 2016-06-09 (×5): qty 1

## 2016-06-09 MED ORDER — RISPERIDONE MICROSPHERES 25 MG IM SUSR: 25.0000 mg | INTRAMUSCULAR | 2 refills | Status: DC
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
## 2016-06-09 NOTE — ED Provider Notes (Signed)
[facility] DEPT Provider Note   CSN: [id number] Arrival date & time: 06/09/16  1009     History   Chief Complaint Chief Complaint  Patient presents with  . Medical Clearance    HPI [name] [name]. is a 51 y.o. male.  The history is provided by the patient and medical records. No language interpreter was used.  Mental Health Problem  Presenting symptoms: agitation, bizarre behavior, disorganized speech and disorganized thought process   Patient accompanied by:  Law enforcement Pt is pleasant. He answers questions but answers are rambling and do not make any sense.  Level 5 caveat due to psychosis  Past Medical History:  Diagnosis Date  . Diabetes mellitus type II, uncontrolled ([place])   . HTN (hypertension)   . Hyperlipidemia LDL goal <70   . Schizophrenia [facility])     Patient Active Problem List   Diagnosis Date Noted  . DM (diabetes mellitus) type II uncontrolled, periph vascular disorder ([place]) 02/29/2016  . Schizophrenia, disorganized type ([place]) 06/02/2015  . Hyperlipidemia 09/23/2014  . Renal insufficiency 09/23/2014  . HTN (hypertension) 03/22/2013  . Seasonal allergies 03/22/2013    No past surgical history on file.     Home Medications    Prior to Admission medications   Medication Sig Start Date End Date Taking? Authorizing Provider  aspirin EC 81 MG tablet Take 1 tablet (81 mg total) by mouth daily. 01/21/16  Yes Debbrah Alar, NP  loratadine (CLARITIN) 10 MG tablet Take 1 tablet (10 mg total) by mouth daily as needed. Patient taking differently: Take 10 mg by mouth daily.  01/27/15  Yes Yvonne R Lowne Chase, DO  metoprolol tartrate (LOPRESSOR) 25 MG tablet TAKE 1 TABLET (25 MG TOTAL) BY MOUTH 2 (TWO) TIMES DAILY. 05/03/16  Yes Yvonne R Lowne Chase, DO  mirtazapine (REMERON) 30 MG tablet Take 1 tablet (30 mg total) by mouth at bedtime. 05/05/16  Yes Kathlee Nations, MD  risperiDONE (RISPERDAL) 2 MG tablet Take 1 tab in am and 2 at bed  time Patient taking differently: Take 2-4 mg by mouth 2 (two) times daily. Take 1 tab in am and 2 at bed time 05/05/16  Yes Kathlee Nations, MD  simvastatin (ZOCOR) 40 MG tablet Take 1 tablet (40 mg total) by mouth daily. Office visit due now 05/25/16  Yes Rosalita Chessman Chase, DO  traZODone (DESYREL) 50 MG tablet Take 1 tablet (50 mg total) by mouth at bedtime. 05/05/16  Yes Kathlee Nations, MD    Family History Family History  Problem Relation Age of Onset  . Breast cancer Mother   . Diabetes Mother   . Prostate cancer Father     StepFather  . Hypertension Father   . Breast cancer Maternal Aunt   . Diabetes Maternal Uncle     Social History Social History  Substance Use Topics  . Smoking status: Current Every Day Smoker    Packs/day: 1.00    Years: 30.00    Types: Cigarettes, Cigars  . Smokeless tobacco: Never Used     Comment: cigarettes-1 pack every couple of days, cigars-pack a day  . Alcohol use No     Comment: Socially / not often     Allergies   Patient has no known allergies.   Review of Systems Review of Systems  Psychiatric/Behavioral: Positive for agitation.  All other systems reviewed and are negative.    Physical Exam Updated Vital Signs BP (!) 143/103 (BP Location: Left Arm)   Pulse Marland Kitchen)  104   Temp 98.3 F (36.8 C) (Oral)   Resp 18   SpO2 93%   Physical Exam  Constitutional: He is oriented to person, place, and time. He appears well-developed and well-nourished.  HENT:  Head: Normocephalic.  Right Ear: External ear normal.  Nose: Nose normal.  Mouth/Throat: Oropharynx is clear and moist.  Eyes: Conjunctivae are normal.  Neck: Normal range of motion.  Cardiovascular: Normal rate.   Pulmonary/Chest: Effort normal.  Abdominal: Soft.  Musculoskeletal: Normal range of motion.  Neurological: He is alert and oriented to person, place, and time.  Skin: Skin is warm.  Psychiatric: He has a normal mood and affect.  Nursing note and vitals  reviewed.    ED Treatments / Results  Labs (all labs ordered are listed, but only abnormal results are displayed) Labs Reviewed  COMPREHENSIVE METABOLIC PANEL - Abnormal; Notable for the following:       Result Value   Sodium 133 (*)    CO2 19 (*)    Glucose, Bld 119 (*)    BUN 21 (*)    Creatinine, Ser 1.83 (*)    GFR calc non Af Amer 42 (*)    GFR calc Af Amer 49 (*)    All other components within normal limits  CBC WITH DIFFERENTIAL/PLATELET - Abnormal; Notable for the following:    WBC 11.4 (*)    Platelets 488 (*)    Neutro Abs 8.7 (*)    Monocytes Absolute 1.2 (*)    All other components within normal limits  ETHANOL  RAPID URINE DRUG SCREEN, HOSP PERFORMED    EKG  EKG Interpretation None       Radiology No results found.  Procedures Procedures (including critical care time)  Medications Ordered in ED Medications  traZODone (DESYREL) tablet 50 mg (not administered)  risperiDONE (RISPERDAL) tablet 2 mg (2 mg Oral Given 06/09/16 1158)  zolpidem (AMBIEN) tablet 5 mg (not administered)  acetaminophen (TYLENOL) tablet 650 mg (not administered)  ibuprofen (ADVIL,MOTRIN) tablet 600 mg (not administered)  LORazepam (ATIVAN) tablet 1 mg (not administered)  alum & mag hydroxide-simeth (MAALOX/MYLANTA) 200-200-20 MG/5ML suspension 30 mL (not administered)     Initial Impression / Assessment and Plan / ED Course  I have reviewed the triage vital signs and the nursing notes.  Pertinent labs & imaging results that were available during my care of the patient were reviewed by me and considered in my medical decision making (see chart for details).   Pt is currently being treated by Pschiatry.  TTS to consult.   Final Clinical Impressions(s) / ED Diagnoses   Final diagnoses:  Medical clearance for psychiatric admission  Psychosis, unspecified psychosis type    New Prescriptions New Prescriptions   No medications on file  TTS consulted.  Psychiatrist will  see in the am.   Fransico Meadow, PA-C 06/09/16 [place], PA-C 06/09/16 1748    Julianne Rice, MD 06/13/16 1116

## 2016-06-09 NOTE — Telephone Encounter (Signed)
The insurance covers the [place], however when I called the pharmacy to see what his co pay would be, they said $860. Patient can not afford this either. I am not sure what you would like to do from here - btw patient is back in the ED (see notes) Please review and advise, thank you.

## 2016-06-09 NOTE — ED Notes (Signed)
Patient given urine cup and asked by nurse to give urine sample.  Patient went to bathroom and forgot to get specimen.

## 2016-06-09 NOTE — ED Notes (Signed)
ED provider at bedside.

## 2016-06-09 NOTE — ED Notes (Signed)
Patient escorted to shower for privacy to perform safety check for contraband and none found. Patient educated about search process and term "contraband " and routine search performed. No contraband found.

## 2016-06-09 NOTE — Telephone Encounter (Signed)
I sent the Risperidone Consta to the pharmacy - the co pay is $292. He can not use a co pay card because his insurance is medicare. The plan prefers Lorayne Bender, would you like to try that instead? Please review and advise, thank you

## 2016-06-09 NOTE — ED Notes (Signed)
Patient calm lying on bed talking to himself outloud.

## 2016-06-09 NOTE — ED Triage Notes (Signed)
Patient brought in by [facility] department today.  Patient is not IVC'd.  Patient denies suicidal or homicidal ideation but is very difficult to understand and is having flight of ideas and appears delusional.

## 2016-06-09 NOTE — BH Assessment (Signed)
[place] Assessment Progress Note  Per Waylan Boga, DNP, this pt does not require psychiatric hospitalization at this time.  He is to be discharged from [facility] with recommendation to keep his previously scheduled appointment with Berniece Andreas, MD at the [facility] at [place].  His next appointment is scheduled for Tuesday, Aug 03, 2016 at 14:45.  This has been included in pt's discharge instructions.   Jalene Mullet, [place] Triage Specialist [phone]

## 2016-06-09 NOTE — Telephone Encounter (Signed)
Yes he can try Invega.  Is that cover insurance?

## 2016-06-09 NOTE — Discharge Instructions (Signed)
For your ongoing behavioral health needs, you are advised to continue treatment with Berniece Andreas, MD, at the [facility] at [place].  Your next appointment is scheduled for Tuesday, Aug 03, 2016 at 2:45 pm:       Berniece Andreas, MD      [facility]. [address]. [place], [place] [id number]      [phone]

## 2016-06-09 NOTE — ED Notes (Signed)
Pt under IVC presents with angry and aggressive behavior per IVC papers, talking to self, and pacing the floors.  Pt also not caring for personal hygiene.  Awake, alert & responsive, no distress noted.  MOnitoring for safety, Q 15 min checks in effect.  Safety check for contraband completed, no items found.

## 2016-06-09 NOTE — ED Notes (Signed)
Patient belongings placed in locker #31.

## 2016-06-10 DIAGNOSIS — F17210 Nicotine dependence, cigarettes, uncomplicated: Secondary | ICD-10-CM | POA: Diagnosis not present

## 2016-06-10 DIAGNOSIS — F250 Schizoaffective disorder, bipolar type: Secondary | ICD-10-CM | POA: Diagnosis present

## 2016-06-10 DIAGNOSIS — F1290 Cannabis use, unspecified, uncomplicated: Secondary | ICD-10-CM | POA: Diagnosis not present

## 2016-06-10 LAB — CBG MONITORING, ED
GLUCOSE-CAPILLARY: 90 mg/dL (ref 65–99)
GLUCOSE-CAPILLARY: 102 mg/dL — AB (ref 65–99)
Glucose-Capillary: 109 mg/dL — ABNORMAL HIGH (ref 65–99)
Glucose-Capillary: 120 mg/dL — ABNORMAL HIGH (ref 65–99)
Glucose-Capillary: 105 mg/dL — ABNORMAL HIGH (ref 65–99)

## 2016-06-10 MED ORDER — TRAZODONE HCL 100 MG PO TABS
100.0000 mg | ORAL_TABLET | Freq: Every evening | ORAL | Status: DC | PRN
  Administered 2016-06-10: 100 mg via ORAL
  Filled 2016-06-10: qty 1

## 2016-06-10 NOTE — ED Notes (Signed)
Pt noted laughing and talking to self in room. Will continue to monitor.

## 2016-06-10 NOTE — Progress Notes (Signed)
06/10/16 1351:  LRT went to pt room to offer activities, pt was sleep.  Victorino Sparrow, LRT/CTRS

## 2016-06-10 NOTE — Telephone Encounter (Signed)
Reviewing notes.  Patient is back in the emergency room because of relapse.  Spoke to triage in the emergency room.  His insurance did not approve for injectables.  I do believe patient need structure environment and should be recommended to ACT services once he is stabilized.  Patient has history of multiple hospitalization, aggressive behavior, noncompliant with medication and recently not stable enough to manage his own medication.

## 2016-06-10 NOTE — ED Notes (Signed)
Pt forwards little with this nurse. Denies SI/HI. Compliant with prescribed medication regimen. Encouragement and support provided. Special checks q 15 mins in place for safety. Video monitoring in place. Will continue to monitor.

## 2016-06-10 NOTE — BH Assessment (Signed)
[place] Assessment Progress Note  Per Corena Pilgrim, MD, this pt requires psychiatric hospitalization at this time.  Pt presents under IVC initiated by his sister and upheld by Dr Darleene Cleaver.  The following facilities have been contacted to seek placement for this pt, with results as noted:  Beds available, information sent, decision pending:  [place] [facility]   At capacity:  [place], [place] Triage Specialist [phone]

## 2016-06-10 NOTE — ED Notes (Signed)
Pt awake, alert & responsive, no distress noted, calm & cooperative, watching TV at present,  Monitoring for safety, Q 15 min checks in effect.  Pending [facility] for 11am,06/11/16.

## 2016-06-10 NOTE — Discharge Planning (Signed)
[facility] consulted in regards to medication assistance.  Pt has insurance coverage and is not eligible for medication assistance program through [facility]. Pt may use goodrx.com and search for company discounts.  No further CM needs communicated at this time.

## 2016-06-10 NOTE — Consult Note (Signed)
[facility] Psychiatry Consult   Reason for Consult:  Psychiatric evaluation Referring Physician:  EDP Patient Identification: Pharell Rolfson. MRN:  [id number] Principal Diagnosis: Schizoaffective disorder, bipolar type [facility]) Diagnosis:   Patient Active Problem List   Diagnosis Date Noted  . Schizoaffective disorder, bipolar type ([place]) [F25.0] 06/10/2016    Priority: High  . Schizophrenia, disorganized type ([place]) [F20.1] 06/02/2015    Priority: High  . DM (diabetes mellitus) type II uncontrolled, periph vascular disorder ([place]) [E11.51, E11.65] 02/29/2016  . Hyperlipidemia [E78.5] 09/23/2014  . Renal insufficiency [N28.9] 09/23/2014  . HTN (hypertension) [I10] 03/22/2013  . Seasonal allergies [J30.2] 03/22/2013    Total Time spent with patient: 45 minutes  Subjective:   Jaking Thayer. is a 51 y.o. male patient admitted with bizarre and aggressive behavior.  HPI:  Patient with history of Schizophrenia and Bipolar disorder who was IVC'd by his sister due to bizarre behavior, disorganized thought processes and flight of ideas. Patient is a poor historian and according to his family he has been showing aggressive behavior, increased agitation, not sleeping, not attending to personal hygiene and laughing inappropriate to herself. Patient has become more argumentative, defiant, oppositional and cursing out his family. Patient denies alcohol abuse but smokes Cannabis regularly.  Past Psychiatric History: as above  Risk to Self: Suicidal Ideation: No Suicidal Intent: No Is patient at risk for suicide?: No Suicidal Plan?: No Access to Means: No How many times?:  (0) Other Self Harm Risks:  (no self harm ) Triggers for Past Attempts: None known Intentional Self Injurious Behavior: None Risk to Others: Homicidal Ideation: No Thoughts of Harm to Others: No Current Homicidal Intent: No Current Homicidal Plan: No Access to Homicidal Means: No Identified Victim:   (n/a) History of harm to others?: No Assessment of Violence: None Noted Violent Behavior Description:  (patient is calm and cooperative ) Does patient have access to weapons?: No Criminal Charges Pending?: No Does patient have a court date: No Prior Inpatient Therapy: Prior Inpatient Therapy: Yes Prior Therapy Dates: multiple Prior Therapy Facilty/Provider(s): [facility], [facility], and others Reason for Treatment: Schizophrenia Prior Outpatient Therapy: Prior Outpatient Therapy: Yes Prior Therapy Dates: current Prior Therapy Facilty/Provider(s): unknown Reason for Treatment: Schizophrenia  Does patient have an ACCT team?: Unknown Does patient have Intensive In-House Services?  : Unknown Does patient have [facility] services? : Unknown Does patient have P4CC services?: Unknown  Past Medical History:  Past Medical History:  Diagnosis Date  . Diabetes mellitus type II, uncontrolled ([place])   . HTN (hypertension)   . Hyperlipidemia LDL goal <70   . Schizophrenia ([place])    No past surgical history on file. Family History:  Family History  Problem Relation Age of Onset  . Breast cancer Mother   . Diabetes Mother   . Prostate cancer Father     StepFather  . Hypertension Father   . Breast cancer Maternal Aunt   . Diabetes Maternal Uncle    Family Psychiatric  History:  Social History:  History  Alcohol Use No    Comment: Socially / not often     History  Drug Use  . Types: Marijuana    Comment: occasional    Social History   Social History  . Marital status: Single    Spouse name: N/A  . Number of children: N/A  . Years of education: N/A   Social History Main Topics  . Smoking status: Current Every Day Smoker    Packs/day: 1.00  Years: 30.00    Types: Cigarettes, Cigars  . Smokeless tobacco: Never Used     Comment: cigarettes-1 pack every couple of days, cigars-pack a day  . Alcohol use No     Comment: Socially / not often  . Drug use: Yes    Types: Marijuana      Comment: occasional  . Sexual activity: No   Other Topics Concern  . Not on file   Social History Narrative   Lives with mother   unemployed   No children   Additional Social History:    Allergies:  No Known Allergies  Labs:  Results for orders placed or performed during the hospital encounter of 06/09/16 (from the past 48 hour(s))  Comprehensive metabolic panel     Status: Abnormal   Collection Time: 06/09/16 11:02 AM  Result Value Ref Range   Sodium 133 (L) 135 - 145 mmol/L   Potassium 3.8 3.5 - 5.1 mmol/L   Chloride 104 101 - 111 mmol/L   CO2 19 (L) 22 - 32 mmol/L   Glucose, Bld 119 (H) 65 - 99 mg/dL   BUN 21 (H) 6 - 20 mg/dL   Creatinine, Ser 1.83 (H) 0.61 - 1.24 mg/dL   Calcium 9.4 8.9 - 10.3 mg/dL   Total Protein 7.8 6.5 - 8.1 g/dL   Albumin 4.5 3.5 - 5.0 g/dL   AST 35 15 - 41 U/L   ALT 42 17 - 63 U/L   Alkaline Phosphatase 60 38 - 126 U/L   Total Bilirubin 0.7 0.3 - 1.2 mg/dL   GFR calc non Af Amer 42 (L) >60 mL/min   GFR calc Af Amer 49 (L) >60 mL/min    Comment: (NOTE) The eGFR has been calculated using the CKD EPI equation. This calculation has not been validated in all clinical situations. eGFR's persistently <60 mL/min signify possible Chronic Kidney Disease.    Anion gap 10 5 - 15  Ethanol     Status: None   Collection Time: 06/09/16 11:02 AM  Result Value Ref Range   Alcohol, Ethyl (B) <5 <5 mg/dL    Comment:        LOWEST DETECTABLE LIMIT FOR SERUM ALCOHOL IS 5 mg/dL FOR MEDICAL PURPOSES ONLY   CBC with Diff     Status: Abnormal   Collection Time: 06/09/16 11:02 AM  Result Value Ref Range   WBC 11.4 (H) 4.0 - 10.5 K/uL   RBC 5.00 4.22 - 5.81 MIL/uL   Hemoglobin 14.5 13.0 - 17.0 g/dL   HCT 42.2 39.0 - 52.0 %   MCV 84.4 78.0 - 100.0 fL   MCH 29.0 26.0 - 34.0 pg   MCHC 34.4 30.0 - 36.0 g/dL   RDW 13.8 11.5 - 15.5 %   Platelets 488 (H) 150 - 400 K/uL   Neutrophils Relative % 77 %   Neutro Abs 8.7 (H) 1.7 - 7.7 K/uL   Lymphocytes Relative  12 %   Lymphs Abs 1.4 0.7 - 4.0 K/uL   Monocytes Relative 10 %   Monocytes Absolute 1.2 (H) 0.1 - 1.0 K/uL   Eosinophils Relative 1 %   Eosinophils Absolute 0.1 0.0 - 0.7 K/uL   Basophils Relative 0 %   Basophils Absolute 0.0 0.0 - 0.1 K/uL  Urine rapid drug screen (hosp performed)not at [facility]     Status: None   Collection Time: 06/09/16 11:02 AM  Result Value Ref Range   Opiates NONE DETECTED NONE DETECTED   Cocaine NONE DETECTED NONE  DETECTED   Benzodiazepines NONE DETECTED NONE DETECTED   Amphetamines NONE DETECTED NONE DETECTED   Tetrahydrocannabinol NONE DETECTED NONE DETECTED   Barbiturates NONE DETECTED NONE DETECTED    Comment:        DRUG SCREEN FOR MEDICAL PURPOSES ONLY.  IF CONFIRMATION IS NEEDED FOR ANY PURPOSE, NOTIFY LAB WITHIN 5 DAYS.        LOWEST DETECTABLE LIMITS FOR URINE DRUG SCREEN Drug Class       Cutoff (ng/mL) Amphetamine      1000 Barbiturate      200 Benzodiazepine   017 Tricyclics       494 Opiates          300 Cocaine          300 THC              50   CBG monitoring, ED     Status: Abnormal   Collection Time: 06/09/16  9:34 PM  Result Value Ref Range   Glucose-Capillary 109 (H) 65 - 99 mg/dL   Comment 1 QC Due    Comment 2 Notify RN    Comment 3 Document in Chart   CBG monitoring, ED     Status: Abnormal   Collection Time: 06/10/16  8:01 AM  Result Value Ref Range   Glucose-Capillary 120 (H) 65 - 99 mg/dL    Current Facility-Administered Medications  Medication Dose Route Frequency Provider Last Rate Last Dose  . acetaminophen (TYLENOL) tablet 650 mg  650 mg Oral Q4H PRN Fransico Meadow, PA-C      . alum & mag hydroxide-simeth (MAALOX/MYLANTA) 200-200-20 MG/5ML suspension 30 mL  30 mL Oral PRN Fransico Meadow, PA-C      . ibuprofen (ADVIL,MOTRIN) tablet 600 mg  600 mg Oral Q8H PRN Fransico Meadow, PA-C      . insulin aspart (novoLOG) injection 0-15 Units  0-15 Units Subcutaneous TID WC Hollace Kinnier Sofia, PA-C      . insulin aspart (novoLOG)  injection 0-5 Units  0-5 Units Subcutaneous QHS Hollace Kinnier Sofia, PA-C      . insulin aspart (novoLOG) injection 4 Units  4 Units Subcutaneous TID WC Fransico Meadow, PA-C   4 Units at 06/10/16 [phone]  . LORazepam (ATIVAN) tablet 1 mg  1 mg Oral Q8H PRN Fransico Meadow, PA-C   1 mg at 06/09/16 2135  . nicotine polacrilex (NICORETTE) gum 2 mg  2 mg Oral PRN Forde Dandy, MD   2 mg at 06/10/16 1045  . risperiDONE (RISPERDAL) tablet 2 mg  2 mg Oral BID Corena Pilgrim, MD   2 mg at 06/10/16 0913  . traZODone (DESYREL) tablet 100 mg  100 mg Oral QHS PRN Corena Pilgrim, MD       Current Outpatient Prescriptions  Medication Sig Dispense Refill  . aspirin EC 81 MG tablet Take 1 tablet (81 mg total) by mouth daily.    Marland Kitchen loratadine (CLARITIN) 10 MG tablet Take 1 tablet (10 mg total) by mouth daily as needed. (Patient taking differently: Take 10 mg by mouth daily. ) 90 tablet 1  . metoprolol tartrate (LOPRESSOR) 25 MG tablet TAKE 1 TABLET (25 MG TOTAL) BY MOUTH 2 (TWO) TIMES DAILY. 180 tablet 1  . mirtazapine (REMERON) 30 MG tablet Take 1 tablet (30 mg total) by mouth at bedtime. 30 tablet 2  . risperiDONE (RISPERDAL) 2 MG tablet Take 1 tab in am and 2 at bed time (Patient taking differently: Take 2-4 mg  by mouth 2 (two) times daily. Take 1 tab in am and 2 at bed time) 90 tablet 2  . simvastatin (ZOCOR) 40 MG tablet Take 1 tablet (40 mg total) by mouth daily. Office visit due now 90 tablet 1  . traZODone (DESYREL) 50 MG tablet Take 1 tablet (50 mg total) by mouth at bedtime. 30 tablet 2    Musculoskeletal: Strength & Muscle Tone: within normal limits Gait & Station: normal Patient leans: N/A  Psychiatric Specialty Exam: Physical Exam  Psychiatric: Thought content normal. His affect is labile. His speech is tangential. He is agitated, actively hallucinating and combative. Cognition and memory are normal. He expresses impulsivity.    Review of Systems  Constitutional: Negative.   HENT: Negative.   Eyes:  Negative.   Respiratory: Negative.   Cardiovascular: Negative.   Gastrointestinal: Negative.   Genitourinary: Negative.   Musculoskeletal: Negative.   Skin: Negative.   Neurological: Negative.   Endo/Heme/Allergies: Negative.   Psychiatric/Behavioral: Positive for hallucinations. The patient is nervous/anxious and has insomnia.     Blood pressure 110/69, pulse 100, temperature 98.2 F (36.8 C), temperature source Oral, resp. rate 20, SpO2 98 %.There is no height or weight on file to calculate BMI.  General Appearance: Disheveled  Eye Contact:  Minimal  Speech:  Clear and Coherent  Volume:  Increased  Mood:  Irritable  Affect:  Labile  Thought Process:  Disorganized  Orientation:  Full (Time, Place, and Person)  Thought Content:  Hallucinations: Auditory  Suicidal Thoughts:  No  Homicidal Thoughts:  No  Memory:  Immediate;   Fair Recent;   Fair Remote;   Fair  Judgement:  Poor  Insight:  Shallow  Psychomotor Activity:  Increased  Concentration:  Concentration: Fair and Attention Span: Fair  Recall:  [name] of Knowledge:  Fair  Language:  Good  Akathisia:  No  Handed:  Right  AIMS (if indicated):     Assets:  Communication Skills Social Support  ADL's:  Intact  Cognition:  WNL  Sleep:   poor     Treatment Plan Summary: Daily contact with patient to assess and evaluate symptoms and progress in treatment and Medication management  Continue Risperdal 70m bid for psychosis/agitation Trazodone 100 mg qhs as needed for sleep  Disposition: Recommend psychiatric Inpatient admission when medically cleared.  ACorena Pilgrim MD 06/10/2016 10:59 AM

## 2016-06-10 NOTE — Progress Notes (Addendum)
CSW received a call from [facility].   Pt has been accepted and can arrive after 11am on 06/11/16  Number for report is: [phone] Pt's room number will be: TBD Pt's unit will be: [address] [place] [address]  CSW will update RN and EDP.  5:38 PM CSW updated RN and EDP.  Alphonse Guild. Aislin Onofre, Latanya Presser, LCAS Clinical Social Worker Ph: [phone]

## 2016-06-11 LAB — CBG MONITORING, ED: Glucose-Capillary: 116 mg/dL — ABNORMAL HIGH (ref 65–99)

## 2016-06-11 NOTE — ED Notes (Signed)
Sheriff on unit to transport pt to [facility] per MD order. Pt signed for personal property and property given to sheriff for transport. Pt ambulatory off unit.

## 2016-06-16 ENCOUNTER — Other Ambulatory Visit: Payer: Self-pay | Admitting: Family Medicine

## 2016-06-16 DIAGNOSIS — J302 Other seasonal allergic rhinitis: Secondary | ICD-10-CM

## 2016-06-22 ENCOUNTER — Other Ambulatory Visit (HOSPITAL_COMMUNITY): Payer: Self-pay

## 2016-06-22 ENCOUNTER — Encounter (HOSPITAL_COMMUNITY): Payer: Self-pay

## 2016-06-22 ENCOUNTER — Ambulatory Visit (HOSPITAL_COMMUNITY): Payer: Medicare HMO | Admitting: Licensed Clinical Social Worker

## 2016-06-23 ENCOUNTER — Other Ambulatory Visit (HOSPITAL_COMMUNITY): Payer: Self-pay

## 2016-06-23 ENCOUNTER — Ambulatory Visit (INDEPENDENT_AMBULATORY_CARE_PROVIDER_SITE_OTHER): Payer: Medicare HMO | Admitting: Psychiatry

## 2016-06-23 ENCOUNTER — Encounter (HOSPITAL_COMMUNITY): Payer: Self-pay | Admitting: Psychiatry

## 2016-06-23 VITALS — BP 126/78 | HR 91 | Ht 71.0 in | Wt 233.6 lb

## 2016-06-23 DIAGNOSIS — F17210 Nicotine dependence, cigarettes, uncomplicated: Secondary | ICD-10-CM | POA: Diagnosis not present

## 2016-06-23 DIAGNOSIS — Z7982 Long term (current) use of aspirin: Secondary | ICD-10-CM

## 2016-06-23 DIAGNOSIS — F209 Schizophrenia, unspecified: Secondary | ICD-10-CM

## 2016-06-23 DIAGNOSIS — F1290 Cannabis use, unspecified, uncomplicated: Secondary | ICD-10-CM

## 2016-06-23 DIAGNOSIS — Z79899 Other long term (current) drug therapy: Secondary | ICD-10-CM

## 2016-06-23 MED ORDER — RISPERIDONE 2 MG PO TABS: ORAL_TABLET | ORAL | 0 refills | Status: DC

## 2016-06-23 MED ORDER — MIRTAZAPINE 30 MG PO TABS: 30.0000 mg | ORAL_TABLET | Freq: Every day | ORAL | 0 refills | Status: DC

## 2016-06-23 MED ORDER — DIVALPROEX SODIUM 250 MG PO DR TAB: 250.0000 mg | DELAYED_RELEASE_TABLET | Freq: Every day | ORAL | 0 refills | Status: DC

## 2016-06-23 MED ORDER — TRAZODONE HCL 100 MG PO TABS: 100.0000 mg | ORAL_TABLET | Freq: Every day | ORAL | 0 refills | Status: DC

## 2016-06-23 NOTE — Progress Notes (Signed)
[place] MD/PA/NP OP Progress Note  06/23/2016 10:16 AM [name] [name].  MRN:  [id number]  Chief Complaint:  Subjective:  I was again hospitalized at [facility].  My mom committed me.    HPI: Kramer came for his follow-up appointment with his friend where he is staying since yesterday.  Patient was recently admitted at [facility] under involuntary commitment because he was having bizarre behavior, loud, aggressive, agitated and talking to himself.  Apparently noncompliant with medication as per mother.  However patient insists that he and her mother do not get along very well and she does cause a lot of issues.  Now he is decided to live with his family friend who also came today with him.  Patient has multiple hospitalization and he has history of noncompliance with medication.  We have tried injectables but his insurance does not cover injectables.  At [facility] his Risperdal was reduced and he was given lithium.  However he has chronic renal insufficiency and his loss creatinine was 1.83.  As per his family friend he is taking his medication and seems to be sleeping much better.  Patient denies any paranoia, hallucination, anger, suicidal thoughts or homicidal thought.  He is a poor historian but able to remember his medication.  Most of the information was obtained through medical record and his family friend.  Patient is not interested in counseling.  We have discussed to refer him for ACT services as patient requires structure and high level of care.  Patient denies any tremors shakes or any EPS.  He is not interested and Cogentin.  His appetite is okay.  His vital signs are stable.  Patient denies drinking alcohol or using any illegal substances.    Visit Diagnosis:    ICD-9-CM ICD-10-CM   1. Schizophrenia, unspecified type ([place]) 295.90 F20.9 traZODone (DESYREL) 100 MG tablet     mirtazapine (REMERON) 30 MG tablet     risperiDONE (RISPERDAL) 2 MG tablet     divalproex  (DEPAKOTE) 250 MG DR tablet    Past Psychiatric History: Reviewed.   Patient has history of psychiatric illness since age 21.He was in [place] when symptoms started. He remember his behavior became very irrational and bizarre. He has  multiple psychiatric hospitalization.  [facility] [place].  Most of the hospitalization was due to non-compliance with medication. In the past he has taken Abilify, Lexapro, Neurontin. Patient denies any history of suicidal attempt. As per record he has history of paranoia, hallucination and bizarre behavior.  Past Medical History:  Past Medical History:  Diagnosis Date  . Diabetes mellitus type II, uncontrolled ([place])   . HTN (hypertension)   . Hyperlipidemia LDL goal <70   . Schizophrenia ([place])    History reviewed. No pertinent surgical history.  Family Psychiatric History: Reviewed.   Family History:  Family History  Problem Relation Age of Onset  . Breast cancer Mother   . Diabetes Mother   . Prostate cancer Father     StepFather  . Hypertension Father   . Breast cancer Maternal Aunt   . Diabetes Maternal Uncle     Social History:  Social History   Social History  . Marital status: Single    Spouse name: N/A  . Number of children: N/A  . Years of education: N/A   Social History Main Topics  . Smoking status: Current Every  Day Smoker    Packs/day: 1.00    Years: 30.00    Types: Cigarettes, Cigars  . Smokeless tobacco: Never Used     Comment: cigarettes-1 pack every couple of days, cigars-pack a day  . Alcohol use No     Comment: Socially / not often  . Drug use: Yes    Types: Marijuana     Comment: occasional  . Sexual activity: No   Other Topics Concern  . None   Social History Narrative   Lives with mother   unemployed   No children    Allergies: No Known Allergies  Metabolic Disorder Labs: Recent  Results (from the past 2160 hour(s))  Comprehensive metabolic panel     Status: Abnormal   Collection Time: 04/04/16  8:52 PM  Result Value Ref Range   Sodium 134 (L) 135 - 145 mmol/L   Potassium 3.5 3.5 - 5.1 mmol/L   Chloride 102 101 - 111 mmol/L   CO2 21 (L) 22 - 32 mmol/L   Glucose, Bld 161 (H) 65 - 99 mg/dL   BUN 21 (H) 6 - 20 mg/dL   Creatinine, Ser 1.94 (H) 0.61 - 1.24 mg/dL   Calcium 9.8 8.9 - 10.3 mg/dL   Total Protein 8.0 6.5 - 8.1 g/dL   Albumin 4.5 3.5 - 5.0 g/dL   AST 39 15 - 41 U/L   ALT 47 17 - 63 U/L   Alkaline Phosphatase 65 38 - 126 U/L   Total Bilirubin 0.5 0.3 - 1.2 mg/dL   GFR calc non Af Amer 39 (L) >60 mL/min   GFR calc Af Amer 45 (L) >60 mL/min    Comment: (NOTE) The eGFR has been calculated using the CKD EPI equation. This calculation has not been validated in all clinical situations. eGFR's persistently <60 mL/min signify possible Chronic Kidney Disease.    Anion gap 11 5 - 15  Ethanol     Status: None   Collection Time: 04/04/16  8:52 PM  Result Value Ref Range   Alcohol, Ethyl (B) <5 <5 mg/dL    Comment:        LOWEST DETECTABLE LIMIT FOR SERUM ALCOHOL IS 5 mg/dL FOR MEDICAL PURPOSES ONLY   Salicylate level     Status: None   Collection Time: 04/04/16  8:52 PM  Result Value Ref Range   Salicylate Lvl <2.4 2.8 - 30.0 mg/dL  Acetaminophen level     Status: Abnormal   Collection Time: 04/04/16  8:52 PM  Result Value Ref Range   Acetaminophen (Tylenol), Serum <10 (L) 10 - 30 ug/mL    Comment:        THERAPEUTIC CONCENTRATIONS VARY SIGNIFICANTLY. A RANGE OF 10-30 ug/mL MAY BE AN EFFECTIVE CONCENTRATION FOR MANY PATIENTS. HOWEVER, SOME ARE BEST TREATED AT CONCENTRATIONS OUTSIDE THIS RANGE. ACETAMINOPHEN CONCENTRATIONS >150 ug/mL AT 4 HOURS AFTER INGESTION AND >50 ug/mL AT 12 HOURS AFTER INGESTION ARE OFTEN ASSOCIATED WITH TOXIC REACTIONS.   cbc     Status: Abnormal   Collection Time: 04/04/16  8:52 PM  Result Value Ref Range   WBC  17.2 (H) 4.0 - 10.5 K/uL   RBC 5.29 4.22 - 5.81 MIL/uL   Hemoglobin 16.0 13.0 - 17.0 g/dL   HCT 44.9 39.0 - 52.0 %   MCV 84.9 78.0 - 100.0 fL   MCH 30.2 26.0 - 34.0 pg   MCHC 35.6 30.0 - 36.0 g/dL   RDW 13.0 11.5 - 15.5 %   Platelets 520 (H) 150 -  400 K/uL  CBG monitoring, ED     Status: Abnormal   Collection Time: 04/05/16  1:42 AM  Result Value Ref Range   Glucose-Capillary 113 (H) 65 - 99 mg/dL   Comment 1 Notify RN   CBG monitoring, ED     Status: Abnormal   Collection Time: 04/05/16  8:17 AM  Result Value Ref Range   Glucose-Capillary 112 (H) 65 - 99 mg/dL  CBC     Status: Abnormal   Collection Time: 05/27/16  3:09 PM  Result Value Ref Range   WBC 7.7 4.0 - 10.5 K/uL   RBC 5.08 4.22 - 5.81 Mil/uL   Platelets 471.0 (H) 150.0 - 400.0 K/uL   Hemoglobin 15.0 13.0 - 17.0 g/dL   HCT 44.7 39.0 - 52.0 %   MCV 88.1 78.0 - 100.0 fl   MCHC 33.5 30.0 - 36.0 g/dL   RDW 13.9 11.5 - 15.5 %  Hemoglobin A1c     Status: Abnormal   Collection Time: 05/27/16  3:09 PM  Result Value Ref Range   Hgb A1c MFr Bld 6.6 (H) 4.6 - 6.5 %    Comment: Glycemic Control Guidelines for People with Diabetes:Non Diabetic:  <6%Goal of Therapy: <7%Additional Action Suggested:  >8%   Comprehensive metabolic panel     Status: Abnormal   Collection Time: 05/27/16  3:09 PM  Result Value Ref Range   Sodium 133 (L) 135 - 145 mEq/L   Potassium 4.5 3.5 - 5.1 mEq/L   Chloride 106 96 - 112 mEq/L   CO2 24 19 - 32 mEq/L   Glucose, Bld 61 (L) 70 - 99 mg/dL   BUN 12 6 - 23 mg/dL   Creatinine, Ser 1.74 (H) 0.40 - 1.50 mg/dL   Total Bilirubin 0.2 0.2 - 1.2 mg/dL   Alkaline Phosphatase 59 39 - 117 U/L   AST 29 0 - 37 U/L   ALT 45 0 - 53 U/L   Total Protein 7.3 6.0 - 8.3 g/dL   Albumin 4.3 3.5 - 5.2 g/dL   Calcium 9.7 8.4 - 10.5 mg/dL   GFR 54.01 (L) >60.00 mL/min  Lipid panel     Status: Abnormal   Collection Time: 05/27/16  3:09 PM  Result Value Ref Range   Cholesterol 197 0 - 200 mg/dL    Comment: ATP III  Classification       Desirable:  < 200 mg/dL               Borderline High:  200 - 239 mg/dL          High:  > = 240 mg/dL   Triglycerides 209.0 (H) 0.0 - 149.0 mg/dL    Comment: Normal:  <150 mg/dLBorderline High:  150 - 199 mg/dL   HDL 37.10 (L) >39.00 mg/dL   VLDL 41.8 (H) 0.0 - 40.0 mg/dL   Total CHOL/HDL Ratio 5     Comment:                Men          Women1/2 Average Risk     3.4          3.3Average Risk          5.0          4.42X Average Risk          9.6          7.13X Average Risk          15.0  11.0                       NonHDL 159.88     Comment: NOTE:  Non-HDL goal should be 30 mg/dL higher than patient's LDL goal (i.e. LDL goal of < 70 mg/dL, would have non-HDL goal of < 100 mg/dL)  Microalbumin / creatinine urine ratio     Status: Abnormal   Collection Time: 05/27/16  3:09 PM  Result Value Ref Range   Microalb, Ur 2.2 (H) 0.0 - 1.9 mg/dL   Creatinine,U 20.7 mg/dL   Microalb Creat Ratio 10.6 0.0 - 30.0 mg/g  LDL cholesterol, direct     Status: None   Collection Time: 05/27/16  3:09 PM  Result Value Ref Range   Direct LDL 133.0 mg/dL    Comment: Optimal:  <100 mg/dLNear or Above Optimal:  100-129 mg/dLBorderline High:  130-159 mg/dLHigh:  160-189 mg/dLVery High:  >190 mg/dL  Comprehensive metabolic panel     Status: Abnormal   Collection Time: 06/07/16  6:34 PM  Result Value Ref Range   Sodium 134 (L) 135 - 145 mmol/L   Potassium 3.7 3.5 - 5.1 mmol/L   Chloride 104 101 - 111 mmol/L   CO2 17 (L) 22 - 32 mmol/L   Glucose, Bld 88 65 - 99 mg/dL   BUN 20 6 - 20 mg/dL   Creatinine, Ser 1.90 (H) 0.61 - 1.24 mg/dL   Calcium 9.6 8.9 - 10.3 mg/dL   Total Protein 8.1 6.5 - 8.1 g/dL   Albumin 4.4 3.5 - 5.0 g/dL   AST 34 15 - 41 U/L   ALT 42 17 - 63 U/L   Alkaline Phosphatase 58 38 - 126 U/L   Total Bilirubin 1.1 0.3 - 1.2 mg/dL   GFR calc non Af Amer 40 (L) >60 mL/min   GFR calc Af Amer 47 (L) >60 mL/min    Comment: (NOTE) The eGFR has been calculated using the CKD  EPI equation. This calculation has not been validated in all clinical situations. eGFR's persistently <60 mL/min signify possible Chronic Kidney Disease.    Anion gap 13 5 - 15  Ethanol     Status: None   Collection Time: 06/07/16  6:34 PM  Result Value Ref Range   Alcohol, Ethyl (B) <5 <5 mg/dL    Comment:        LOWEST DETECTABLE LIMIT FOR SERUM ALCOHOL IS 5 mg/dL FOR MEDICAL PURPOSES ONLY   Salicylate level     Status: None   Collection Time: 06/07/16  6:34 PM  Result Value Ref Range   Salicylate Lvl <5.3 2.8 - 30.0 mg/dL  Acetaminophen level     Status: Abnormal   Collection Time: 06/07/16  6:34 PM  Result Value Ref Range   Acetaminophen (Tylenol), Serum <10 (L) 10 - 30 ug/mL    Comment:        THERAPEUTIC CONCENTRATIONS VARY SIGNIFICANTLY. A RANGE OF 10-30 ug/mL MAY BE AN EFFECTIVE CONCENTRATION FOR MANY PATIENTS. HOWEVER, SOME ARE BEST TREATED AT CONCENTRATIONS OUTSIDE THIS RANGE. ACETAMINOPHEN CONCENTRATIONS >150 ug/mL AT 4 HOURS AFTER INGESTION AND >50 ug/mL AT 12 HOURS AFTER INGESTION ARE OFTEN ASSOCIATED WITH TOXIC REACTIONS.   cbc     Status: Abnormal   Collection Time: 06/07/16  6:34 PM  Result Value Ref Range   WBC 11.5 (H) 4.0 - 10.5 K/uL   RBC 5.24 4.22 - 5.81 MIL/uL   Hemoglobin 15.3 13.0 - 17.0 g/dL   HCT 43.9  39.0 - 52.0 %   MCV 83.8 78.0 - 100.0 fL   MCH 29.2 26.0 - 34.0 pg   MCHC 34.9 30.0 - 36.0 g/dL   RDW 13.9 11.5 - 15.5 %   Platelets 473 (H) 150 - 400 K/uL  Rapid urine drug screen (hospital performed)     Status: Abnormal   Collection Time: 06/07/16  9:35 PM  Result Value Ref Range   Opiates NONE DETECTED NONE DETECTED   Cocaine NONE DETECTED NONE DETECTED   Benzodiazepines NONE DETECTED NONE DETECTED   Amphetamines NONE DETECTED NONE DETECTED   Tetrahydrocannabinol POSITIVE (A) NONE DETECTED   Barbiturates NONE DETECTED NONE DETECTED    Comment:        DRUG SCREEN FOR MEDICAL PURPOSES ONLY.  IF CONFIRMATION IS NEEDED FOR ANY  PURPOSE, NOTIFY LAB WITHIN 5 DAYS.        LOWEST DETECTABLE LIMITS FOR URINE DRUG SCREEN Drug Class       Cutoff (ng/mL) Amphetamine      1000 Barbiturate      200 Benzodiazepine   195 Tricyclics       093 Opiates          300 Cocaine          300 THC              50   Comprehensive metabolic panel     Status: Abnormal   Collection Time: 06/09/16 11:02 AM  Result Value Ref Range   Sodium 133 (L) 135 - 145 mmol/L   Potassium 3.8 3.5 - 5.1 mmol/L   Chloride 104 101 - 111 mmol/L   CO2 19 (L) 22 - 32 mmol/L   Glucose, Bld 119 (H) 65 - 99 mg/dL   BUN 21 (H) 6 - 20 mg/dL   Creatinine, Ser 1.83 (H) 0.61 - 1.24 mg/dL   Calcium 9.4 8.9 - 10.3 mg/dL   Total Protein 7.8 6.5 - 8.1 g/dL   Albumin 4.5 3.5 - 5.0 g/dL   AST 35 15 - 41 U/L   ALT 42 17 - 63 U/L   Alkaline Phosphatase 60 38 - 126 U/L   Total Bilirubin 0.7 0.3 - 1.2 mg/dL   GFR calc non Af Amer 42 (L) >60 mL/min   GFR calc Af Amer 49 (L) >60 mL/min    Comment: (NOTE) The eGFR has been calculated using the CKD EPI equation. This calculation has not been validated in all clinical situations. eGFR's persistently <60 mL/min signify possible Chronic Kidney Disease.    Anion gap 10 5 - 15  Ethanol     Status: None   Collection Time: 06/09/16 11:02 AM  Result Value Ref Range   Alcohol, Ethyl (B) <5 <5 mg/dL    Comment:        LOWEST DETECTABLE LIMIT FOR SERUM ALCOHOL IS 5 mg/dL FOR MEDICAL PURPOSES ONLY   CBC with Diff     Status: Abnormal   Collection Time: 06/09/16 11:02 AM  Result Value Ref Range   WBC 11.4 (H) 4.0 - 10.5 K/uL   RBC 5.00 4.22 - 5.81 MIL/uL   Hemoglobin 14.5 13.0 - 17.0 g/dL   HCT 42.2 39.0 - 52.0 %   MCV 84.4 78.0 - 100.0 fL   MCH 29.0 26.0 - 34.0 pg   MCHC 34.4 30.0 - 36.0 g/dL   RDW 13.8 11.5 - 15.5 %   Platelets 488 (H) 150 - 400 K/uL   Neutrophils Relative % 77 %   Neutro Abs  8.7 (H) 1.7 - 7.7 K/uL   Lymphocytes Relative 12 %   Lymphs Abs 1.4 0.7 - 4.0 K/uL   Monocytes Relative 10 %    Monocytes Absolute 1.2 (H) 0.1 - 1.0 K/uL   Eosinophils Relative 1 %   Eosinophils Absolute 0.1 0.0 - 0.7 K/uL   Basophils Relative 0 %   Basophils Absolute 0.0 0.0 - 0.1 K/uL  Urine rapid drug screen (hosp performed)not at [facility]     Status: None   Collection Time: 06/09/16 11:02 AM  Result Value Ref Range   Opiates NONE DETECTED NONE DETECTED   Cocaine NONE DETECTED NONE DETECTED   Benzodiazepines NONE DETECTED NONE DETECTED   Amphetamines NONE DETECTED NONE DETECTED   Tetrahydrocannabinol NONE DETECTED NONE DETECTED   Barbiturates NONE DETECTED NONE DETECTED    Comment:        DRUG SCREEN FOR MEDICAL PURPOSES ONLY.  IF CONFIRMATION IS NEEDED FOR ANY PURPOSE, NOTIFY LAB WITHIN 5 DAYS.        LOWEST DETECTABLE LIMITS FOR URINE DRUG SCREEN Drug Class       Cutoff (ng/mL) Amphetamine      1000 Barbiturate      200 Benzodiazepine   202 Tricyclics       334 Opiates          300 Cocaine          300 THC              50   CBG monitoring, ED     Status: Abnormal   Collection Time: 06/09/16  9:34 PM  Result Value Ref Range   Glucose-Capillary 109 (H) 65 - 99 mg/dL   Comment 1 QC Due    Comment 2 Notify RN    Comment 3 Document in Chart   CBG monitoring, ED     Status: Abnormal   Collection Time: 06/10/16  8:01 AM  Result Value Ref Range   Glucose-Capillary 120 (H) 65 - 99 mg/dL  CBG monitoring, ED     Status: Abnormal   Collection Time: 06/10/16 12:12 PM  Result Value Ref Range   Glucose-Capillary 105 (H) 65 - 99 mg/dL  CBG monitoring, ED     Status: None   Collection Time: 06/10/16  5:39 PM  Result Value Ref Range   Glucose-Capillary 90 65 - 99 mg/dL  CBG monitoring, ED     Status: Abnormal   Collection Time: 06/10/16  9:13 PM  Result Value Ref Range   Glucose-Capillary 102 (H) 65 - 99 mg/dL  CBG monitoring, ED     Status: Abnormal   Collection Time: 06/11/16  7:48 AM  Result Value Ref Range   Glucose-Capillary 116 (H) 65 - 99 mg/dL   Comment 1 Notify RN    Lab  Results  Component Value Date   HGBA1C 6.6 (H) 05/27/2016   No results found for: PROLACTIN Lab Results  Component Value Date   CHOL 197 05/27/2016   TRIG 209.0 (H) 05/27/2016   HDL 37.10 (L) 05/27/2016   CHOLHDL 5 05/27/2016   VLDL 41.8 (H) 05/27/2016   LDLCALC 107 (H) 06/02/2015     Current Medications: Current Outpatient Prescriptions  Medication Sig Dispense Refill  . aspirin EC 81 MG tablet Take 1 tablet (81 mg total) by mouth daily.    Marland Kitchen lithium carbonate (ESKALITH) 450 MG CR tablet Take 450 mg by mouth 2 (two) times daily.    Marland Kitchen loratadine (CLARITIN) 10 MG tablet TAKE 1 TABLET BY MOUTH  DAILY 30 tablet 0  . metoprolol tartrate (LOPRESSOR) 25 MG tablet TAKE 1 TABLET (25 MG TOTAL) BY MOUTH 2 (TWO) TIMES DAILY. 180 tablet 1  . mirtazapine (REMERON) 30 MG tablet Take 1 tablet (30 mg total) by mouth at bedtime. 30 tablet 2  . risperiDONE (RISPERDAL) 2 MG tablet Take 1 tab in am and 2 at bed time (Patient taking differently: Take 2-4 mg by mouth 2 (two) times daily. Take 1 tab in am and 2 at bed time) 90 tablet 2  . simvastatin (ZOCOR) 40 MG tablet Take 1 tablet (40 mg total) by mouth daily. Office visit due now 90 tablet 1  . traZODone (DESYREL) 50 MG tablet Take 1 tablet (50 mg total) by mouth at bedtime. 30 tablet 2   No current facility-administered medications for this visit.     Neurologic: Headache: No Seizure: No Paresthesias: No  Musculoskeletal: Strength & Muscle Tone: within normal limits Gait & Station: normal Patient leans: N/A  Psychiatric Specialty Exam: ROS  Blood pressure 126/78, pulse 91, height 5' 11"  (1.803 m), weight 233 lb 9.6 oz (106 kg).Body mass index is 32.58 kg/m.  General Appearance: Disheveled and Guarded  Eye Contact:  Fair  Speech:  [place] rambling  Volume:  Normal  Mood:  Anxious  Affect:  Labile  Thought Process:  Descriptions of Associations: Circumstantial  Orientation:  Full (Time, Place, and Person)  Thought Content: Paranoid  Ideation   Suicidal Thoughts:  No  Homicidal Thoughts:  No  Memory:  Immediate;   Fair Recent;   Fair Remote;   Fair  Judgement:  Fair  Insight:  Fair  Psychomotor Activity:  Normal  Concentration:  Concentration: Poor and Attention Span: Fair  Recall:  [name] of Knowledge: Fair  Language: Fair  Akathisia:  No  Handed:  Right  AIMS (if indicated):  0  Assets:  Desire for Improvement Housing Social Support  ADL's:  Intact  Cognition: Impaired,  Mild  Sleep:  Fair   Assessment: Schizophrenia chronic paranoid type.  Cannabis abuse  Plan: I reviewed records from recent ER visits, discharge summary from [facility] and current medication and lab work.  It is unclear why lithium was started at [facility] since patient has high creatinine.  Patient has history of noncompliance with medication causing very quick decompensation.  I have recommended ACT services as patient requires structure and high level of care.  I would discontinue lithium.  Recommended to restart Risperdal 2 mg in the morning and 4 mg at bedtime.  I will also start Depakote 250 mg at bedtime to help with residual irritability.  Continue Remeron 30 mg at bedtime.  Patient is not interested in counseling.  I will continue trazodone 100 mg at bedtime.  Trazodone dose was increased at recent hospitalization at [facility] help.  Patient is staying with his family friend.  He does not want to go back to his mother's house because he believed she had created issues with him.  I had a long discussion with the patient and his family friend about need of compliance with the medication to avoid relapse.  Discuss in length safety issue that anytime having active suicidal thoughts or homicidal thought that he need to call 911 or the local emergency room.  We will do blood work on his next appointment.  Follow-up in 6 weeks.   [employer] Null T., MD 06/23/2016, 10:16 AM

## 2016-06-24 ENCOUNTER — Other Ambulatory Visit: Payer: Self-pay

## 2016-06-24 ENCOUNTER — Other Ambulatory Visit: Payer: Self-pay | Admitting: Family Medicine

## 2016-06-24 DIAGNOSIS — I10 Essential (primary) hypertension: Secondary | ICD-10-CM

## 2016-07-06 ENCOUNTER — Ambulatory Visit (HOSPITAL_COMMUNITY): Payer: Self-pay | Admitting: Psychiatry

## 2016-07-13 ENCOUNTER — Ambulatory Visit (INDEPENDENT_AMBULATORY_CARE_PROVIDER_SITE_OTHER): Payer: Medicare HMO | Admitting: Family Medicine

## 2016-07-13 ENCOUNTER — Encounter: Payer: Self-pay | Admitting: Family Medicine

## 2016-07-13 VITALS — BP 128/78 | HR 91 | Temp 98.1°F | Resp 16 | Ht 71.0 in | Wt 233.0 lb

## 2016-07-13 DIAGNOSIS — R631 Polydipsia: Secondary | ICD-10-CM | POA: Diagnosis not present

## 2016-07-13 DIAGNOSIS — G4730 Sleep apnea, unspecified: Secondary | ICD-10-CM | POA: Diagnosis not present

## 2016-07-13 DIAGNOSIS — R6889 Other general symptoms and signs: Secondary | ICD-10-CM | POA: Diagnosis not present

## 2016-07-13 LAB — POC URINALSYSI DIPSTICK (AUTOMATED)
Bilirubin, UA: NEGATIVE
Blood, UA: NEGATIVE
GLUCOSE UA: NEGATIVE
Ketones, UA: NEGATIVE
Leukocytes, UA: NEGATIVE
Nitrite, UA: NEGATIVE
PROTEIN UA: NEGATIVE
Spec Grav, UA: 1.01 (ref 1.010–1.025)
Urobilinogen, UA: 0.2 E.U./dL
pH, UA: 6 (ref 5.0–8.0)

## 2016-07-13 NOTE — Progress Notes (Signed)
Patient ID: [name] [name]., male   DOB: 09/20/1965, 51 y.o.   MRN: [id number]     Subjective:    Patient ID: [name] [name]., male    DOB: [birth year], 51 y.o.   MRN: [id number]  Chief Complaint  Patient presents with  . Follow-up  . Sleep Apnea  . Polydipsia    drinking more liquids. medications maybe causing it.  . coughing after drinking liquids    at least once a day he nearly gets strangled  . Chills    wearing leather coat in 80 degree weather    HPI Patient is in today for follow up.  Patient stated that he just wants to follow up .  [name] [name] has stated patient has a few issues.  Patient snores a lot but he stops breathing for a long period of time.  He has been drinking a lot because he is thirsty, his sugars have been running in the 100s.  He also coughs after drinking liquids nearly getting strangled.  He has been having temperature problems, like today he is wearing a leather jacket in 80 degree weather.  At home he sits with a lot of layers on him.  Past Medical History:  Diagnosis Date  . Diabetes mellitus type II, uncontrolled ([place])   . HTN (hypertension)   . Hyperlipidemia LDL goal <70   . Schizophrenia ([place])     No past surgical history on file.  Family History  Problem Relation Age of Onset  . Breast cancer Mother   . Diabetes Mother   . Prostate cancer Father     StepFather  . Hypertension Father   . Breast cancer Maternal Aunt   . Diabetes Maternal Uncle     Social History   Social History  . Marital status: Single    Spouse name: N/A  . Number of children: N/A  . Years of education: N/A   Occupational History  . Not on file.   Social History Main Topics  . Smoking status: Current Every Day Smoker    Packs/day: 1.00    Years: 30.00    Types: Cigarettes, Cigars  . Smokeless tobacco: Never Used     Comment: cigarettes-1 pack every couple of days, cigars-pack a day  . Alcohol use No     Comment: Socially / not often  .  Drug use: Yes    Types: Marijuana     Comment: occasional  . Sexual activity: No   Other Topics Concern  . Not on file   Social History Narrative   Lives with mother   unemployed   No children    Outpatient Medications Prior to Visit  Medication Sig Dispense Refill  . aspirin EC 81 MG tablet Take 1 tablet (81 mg total) by mouth daily.    . divalproex (DEPAKOTE) 250 MG DR tablet Take 1 tablet (250 mg total) by mouth at bedtime. 30 tablet 0  . loratadine (CLARITIN) 10 MG tablet TAKE 1 TABLET BY MOUTH DAILY 30 tablet 0  . metoprolol tartrate (LOPRESSOR) 25 MG tablet TAKE 1 TABLET (25 MG TOTAL) BY MOUTH 2 (TWO) TIMES DAILY. 180 tablet 1  . metoprolol tartrate (LOPRESSOR) 25 MG tablet TAKE 1 TABLET (25 MG TOTAL) BY MOUTH 2 (TWO) TIMES DAILY. 180 tablet 1  . mirtazapine (REMERON) 30 MG tablet Take 1 tablet (30 mg total) by mouth at bedtime. 30 tablet 0  . risperiDONE (RISPERDAL) 2 MG tablet Take 1 tab in am and 2 at bed  time 90 tablet 0  . simvastatin (ZOCOR) 40 MG tablet Take 1 tablet (40 mg total) by mouth daily. Office visit due now 90 tablet 1  . traZODone (DESYREL) 100 MG tablet Take 1 tablet (100 mg total) by mouth at bedtime. 30 tablet 0   No facility-administered medications prior to visit.     No Known Allergies  Review of Systems  Constitutional: Negative for fever and malaise/fatigue.  HENT: Negative for congestion.   Eyes: Negative for blurred vision.  Respiratory: Negative for cough and shortness of breath.   Cardiovascular: Negative for chest pain, palpitations and leg swelling.  Gastrointestinal: Negative for vomiting.  Musculoskeletal: Negative for back pain.  Skin: Negative for rash.  Neurological: Negative for loss of consciousness and headaches.  Psychiatric/Behavioral: Positive for depression. Negative for hallucinations, substance abuse and suicidal ideas. The patient is not nervous/anxious and does not have insomnia.        Objective:    Physical Exam    Constitutional: He appears well-developed and well-nourished. No distress.  HENT:  Head: Normocephalic and atraumatic.  Eyes: Conjunctivae are normal.  Neck: Normal range of motion. No thyromegaly present.  Cardiovascular: Normal rate and regular rhythm.   Pulmonary/Chest: Effort normal. He has no wheezes.  Abdominal: Soft. Bowel sounds are normal. There is no tenderness.  Musculoskeletal: Normal range of motion. He exhibits no edema or deformity.  Neurological: He is alert.  Skin: Skin is warm and dry. He is not diaphoretic.  Psychiatric: He has a normal mood and affect.  Nursing note and vitals reviewed.   BP 128/78 (BP Location: Right Arm, Cuff Size: Normal)   Pulse 91   Temp 98.1 F (36.7 C) (Oral)   Resp 16   Ht 5\' 11"  (1.803 m)   Wt 233 lb (105.7 kg)   SpO2 100%   BMI 32.50 kg/m  Wt Readings from Last 3 Encounters:  07/13/16 233 lb (105.7 kg)  06/07/16 237 lb (107.5 kg)  05/27/16 237 lb 9.6 oz (107.8 kg)     Lab Results  Component Value Date   WBC 11.4 (H) 06/09/2016   HGB 14.5 06/09/2016   HCT 42.2 06/09/2016   PLT 488 (H) 06/09/2016   GLUCOSE 119 (H) 06/09/2016   CHOL 197 05/27/2016   TRIG 209.0 (H) 05/27/2016   HDL 37.10 (L) 05/27/2016   LDLDIRECT 133.0 05/27/2016   LDLCALC 107 (H) 06/02/2015   ALT 42 06/09/2016   AST 35 06/09/2016   NA 133 (L) 06/09/2016   K 3.8 06/09/2016   CL 104 06/09/2016   CREATININE 1.83 (H) 06/09/2016   BUN 21 (H) 06/09/2016   CO2 19 (L) 06/09/2016   TSH 1.49 06/02/2015   PSA 0.63 03/22/2013   HGBA1C 6.6 (H) 05/27/2016   MICROALBUR 2.2 (H) 05/27/2016    Lab Results  Component Value Date   TSH 1.49 06/02/2015   Lab Results  Component Value Date   WBC 11.4 (H) 06/09/2016   HGB 14.5 06/09/2016   HCT 42.2 06/09/2016   MCV 84.4 06/09/2016   PLT 488 (H) 06/09/2016   Lab Results  Component Value Date   NA 133 (L) 06/09/2016   K 3.8 06/09/2016   CO2 19 (L) 06/09/2016   GLUCOSE 119 (H) 06/09/2016   BUN 21 (H)  06/09/2016   CREATININE 1.83 (H) 06/09/2016   BILITOT 0.7 06/09/2016   ALKPHOS 60 06/09/2016   AST 35 06/09/2016   ALT 42 06/09/2016   PROT 7.8 06/09/2016   ALBUMIN 4.5 06/09/2016  CALCIUM 9.4 06/09/2016   ANIONGAP 10 06/09/2016   GFR 54.01 (L) 05/27/2016   Lab Results  Component Value Date   CHOL 197 05/27/2016   Lab Results  Component Value Date   HDL 37.10 (L) 05/27/2016   Lab Results  Component Value Date   LDLCALC 107 (H) 06/02/2015   Lab Results  Component Value Date   TRIG 209.0 (H) 05/27/2016   Lab Results  Component Value Date   CHOLHDL 5 05/27/2016   Lab Results  Component Value Date   HGBA1C 6.6 (H) 05/27/2016       Assessment & Plan:   Problem List Items Addressed This Visit    None    Visit Diagnoses    Sleep apnea, unspecified type    -  Primary   Relevant Orders   Ambulatory referral to Pulmonology   Comprehensive metabolic panel   Lipid panel   Hemoglobin A1c   CBC with Differential/Platelet   POCT Urinalysis Dipstick (Automated) (Completed)   TSH   Polydipsia       Relevant Orders   Comprehensive metabolic panel   Lipid panel   Hemoglobin A1c   CBC with Differential/Platelet   POCT Urinalysis Dipstick (Automated) (Completed)   TSH   Cold intolerance       Relevant Orders   Comprehensive metabolic panel   Lipid panel   Hemoglobin A1c   CBC with Differential/Platelet   POCT Urinalysis Dipstick (Automated) (Completed)   TSH      I am having Mr. Colgate maintain his aspirin EC, metoprolol tartrate, simvastatin, loratadine, traZODone, mirtazapine, risperiDONE, divalproex, and metoprolol tartrate.  No orders of the defined types were placed in this encounter.    Ann Held, DO

## 2016-07-13 NOTE — Patient Instructions (Signed)

## 2016-07-13 NOTE — Progress Notes (Signed)
Pre visit review using our clinic review tool, if applicable. No additional management support is needed unless otherwise documented below in the visit note. 

## 2016-07-14 LAB — CBC WITH DIFFERENTIAL/PLATELET
BASOS ABS: 0.1 10*3/uL (ref 0.0–0.1)
Basophils Relative: 1.3 % (ref 0.0–3.0)
EOS ABS: 0.2 10*3/uL (ref 0.0–0.7)
Eosinophils Relative: 2.2 % (ref 0.0–5.0)
HCT: 43.7 % (ref 39.0–52.0)
Hemoglobin: 14.6 g/dL (ref 13.0–17.0)
LYMPHS PCT: 25.1 % (ref 12.0–46.0)
Lymphs Abs: 2.3 10*3/uL (ref 0.7–4.0)
MCHC: 33.5 g/dL (ref 30.0–36.0)
MCV: 88.2 fl (ref 78.0–100.0)
MONOS PCT: 10.6 % (ref 3.0–12.0)
Monocytes Absolute: 1 10*3/uL (ref 0.1–1.0)
NEUTROS ABS: 5.6 10*3/uL (ref 1.4–7.7)
Neutrophils Relative %: 60.8 % (ref 43.0–77.0)
PLATELETS: 496 10*3/uL — AB (ref 150.0–400.0)
RBC: 4.95 Mil/uL (ref 4.22–5.81)
RDW: 14.2 % (ref 11.5–15.5)
WBC: 9.3 10*3/uL (ref 4.0–10.5)

## 2016-07-14 LAB — COMPREHENSIVE METABOLIC PANEL
ALBUMIN: 4.4 g/dL (ref 3.5–5.2)
ALK PHOS: 62 U/L (ref 39–117)
ALT: 29 U/L (ref 0–53)
AST: 20 U/L (ref 0–37)
BILIRUBIN TOTAL: 0.3 mg/dL (ref 0.2–1.2)
BUN: 14 mg/dL (ref 6–23)
CO2: 28 meq/L (ref 19–32)
CREATININE: 1.7 mg/dL — AB (ref 0.40–1.50)
Calcium: 9.8 mg/dL (ref 8.4–10.5)
Chloride: 103 mEq/L (ref 96–112)
GFR: 55.45 mL/min — ABNORMAL LOW (ref >60.00)
GLUCOSE: 64 mg/dL — AB (ref 70–99)
Potassium: 4.1 mEq/L (ref 3.5–5.1)
SODIUM: 136 meq/L (ref 135–145)
Total Protein: 7.5 g/dL (ref 6.0–8.3)

## 2016-07-14 LAB — LIPID PANEL
CHOL/HDL RATIO: 3
Cholesterol: 119 mg/dL (ref 0–200)
HDL: 39.1 mg/dL (ref >39.00)
LDL Cholesterol: 52 mg/dL (ref 0–99)
NONHDL: 80.32
Triglycerides: 144 mg/dL (ref 0.0–149.0)
VLDL: 28.8 mg/dL (ref 0.0–40.0)

## 2016-07-14 LAB — HEMOGLOBIN A1C: HEMOGLOBIN A1C: 6.5 % (ref 4.6–6.5)

## 2016-07-14 LAB — TSH: TSH: 1.38 u[IU]/mL (ref 0.35–4.50)

## 2016-07-19 ENCOUNTER — Other Ambulatory Visit (HOSPITAL_COMMUNITY): Payer: Self-pay | Admitting: Psychiatry

## 2016-07-19 DIAGNOSIS — F209 Schizophrenia, unspecified: Secondary | ICD-10-CM

## 2016-07-20 ENCOUNTER — Other Ambulatory Visit (HOSPITAL_COMMUNITY): Payer: Self-pay | Admitting: Psychiatry

## 2016-07-20 DIAGNOSIS — F209 Schizophrenia, unspecified: Secondary | ICD-10-CM

## 2016-07-21 MED ORDER — MIRTAZAPINE 30 MG PO TABS: 30.0000 mg | ORAL_TABLET | Freq: Every day | ORAL | 0 refills | Status: DC

## 2016-07-21 MED ORDER — DIVALPROEX SODIUM 250 MG PO DR TAB: 250.0000 mg | DELAYED_RELEASE_TABLET | Freq: Every day | ORAL | 0 refills | Status: DC

## 2016-07-21 NOTE — Telephone Encounter (Signed)
Medication refills - Met with Dr. Adele Schilder to review patient's requested refills due to being canceled and rescheduled from 06/23/16 until 08/03/16. Dr. Adele Schilder authorized a one time refill of patient medications until then. New one time orders e-scribed to patient's [facility] on [address] for his Risperdal, Trazodone, Depakote, and Remeron with instruction, evaluation required for further refills.

## 2016-07-27 ENCOUNTER — Other Ambulatory Visit: Payer: Self-pay | Admitting: Family Medicine

## 2016-07-27 DIAGNOSIS — J302 Other seasonal allergic rhinitis: Secondary | ICD-10-CM

## 2016-07-29 ENCOUNTER — Encounter: Payer: Self-pay | Admitting: Family Medicine

## 2016-08-03 ENCOUNTER — Encounter (HOSPITAL_COMMUNITY): Payer: Self-pay | Admitting: Psychiatry

## 2016-08-03 ENCOUNTER — Ambulatory Visit (INDEPENDENT_AMBULATORY_CARE_PROVIDER_SITE_OTHER): Payer: Medicare HMO | Admitting: Psychiatry

## 2016-08-03 DIAGNOSIS — F209 Schizophrenia, unspecified: Secondary | ICD-10-CM | POA: Diagnosis not present

## 2016-08-03 DIAGNOSIS — F17210 Nicotine dependence, cigarettes, uncomplicated: Secondary | ICD-10-CM | POA: Diagnosis not present

## 2016-08-03 DIAGNOSIS — F1290 Cannabis use, unspecified, uncomplicated: Secondary | ICD-10-CM | POA: Diagnosis not present

## 2016-08-03 MED ORDER — MIRTAZAPINE 30 MG PO TABS: 30.0000 mg | ORAL_TABLET | Freq: Every day | ORAL | 2 refills | Status: DC

## 2016-08-03 MED ORDER — TRAZODONE HCL 100 MG PO TABS: 100.0000 mg | ORAL_TABLET | Freq: Every day | ORAL | 2 refills | Status: DC

## 2016-08-03 MED ORDER — RISPERIDONE 2 MG PO TABS: ORAL_TABLET | ORAL | 2 refills | Status: DC

## 2016-08-03 MED ORDER — DIVALPROEX SODIUM 250 MG PO DR TAB: 250.0000 mg | DELAYED_RELEASE_TABLET | Freq: Every day | ORAL | 2 refills | Status: DC

## 2016-08-03 NOTE — Progress Notes (Signed)
BH MD/PA/NP OP Progress Note  08/03/2016 3:02 PM [name] [name].  MRN:  [id number]  Chief Complaint:  Subjective:  I am taking medication and I'm doing fine.  I'm sleeping good with Depakote.  HPI: Patient came for his appointment with his family friend where he is staying since the last hospitalization.  His friend mentioned that he is taking his medication and he has seen a huge improvement in his behavior.  Patient is no longer taking lithium.  We have recommended to try Depakote and he sleeping good.  He denies any paranoia or any hallucination.  Sometimes his thinking is disorganized but he denies any suicidal thoughts or homicidal thought.  He is not agitated and aggressive.  He preferred to stay with his family friend but during the day he used to go to his mother's house to help her.  We have recommended ACT services but patient does not want to leave this office.  He is tolerating his medication and reported no side effects.  Recently he seen his primary care physician because of his snoring and excessive drinking.  He had blood work including TSH which is normal.  His creatinine is improved from 1.83 to 1.70.  He is scheduled to see Dr. Posey Pronto on June 4.  He has cut down his cannabis abuse.  Visit Diagnosis:    ICD-9-CM ICD-10-CM   1. Schizophrenia, unspecified type ([place]) 295.90 F20.9 traZODone (DESYREL) 100 MG tablet     mirtazapine (REMERON) 30 MG tablet     divalproex (DEPAKOTE) 250 MG DR tablet     risperiDONE (RISPERDAL) 2 MG tablet    Past Psychiatric History: Reviewed. Patient has history of psychiatric illness since age 51.He was in [place] when symptoms started. He remember his behavior became very irrational and bizarre. He has  multiple psychiatric hospitalization. [facility] [place].  Most of the hospitalization was due to non-compliance with  medication. In the past he has taken Abilify, Lexapro, Neurontin.  We have recommended injectable but his insurance does not approve.  Patient denies any history of suicidal attempt. As per record he has history of paranoia, hallucination and bizarre behavior.  Past Medical History:  Past Medical History:  Diagnosis Date  . Diabetes mellitus type II, uncontrolled ([place])   . HTN (hypertension)   . Hyperlipidemia LDL goal <70   . Schizophrenia ([place])    History reviewed. No pertinent surgical history.  Family Psychiatric History: Reviewed.  Family History:  Family History  Problem Relation Age of Onset  . Breast cancer Mother   . Diabetes Mother   . Prostate cancer Father        StepFather  . Hypertension Father   . Breast cancer Maternal Aunt   . Diabetes Maternal Uncle     Social History:  Social History   Social History  . Marital status: Single    Spouse name: N/A  . Number of children: N/A  . Years of education: N/A   Social History Main Topics  . Smoking status: Current Every Day Smoker    Packs/day: 1.00    Years: 30.00    Types: Cigarettes, Cigars  . Smokeless tobacco: Never Used     Comment: cigarettes-1 pack every couple of days, cigars-pack a day  . Alcohol use No     Comment: no longer  . Drug use: Yes    Types: Marijuana  Comment: not currently  . Sexual activity: No   Other Topics Concern  . None   Social History Narrative   Lives with mother   unemployed   No children    Allergies: No Known Allergies  Metabolic Disorder Labs: Lab Results  Component Value Date   HGBA1C 6.5 07/13/2016   No results found for: PROLACTIN Lab Results  Component Value Date   CHOL 119 07/13/2016   TRIG 144.0 07/13/2016   HDL 39.10 07/13/2016   CHOLHDL 3 07/13/2016   VLDL 28.8 07/13/2016   LDLCALC 52 07/13/2016   LDLCALC 107 (H) 06/02/2015     Current Medications: Current Outpatient Prescriptions  Medication Sig Dispense Refill  . aspirin EC 81 MG  tablet Take 1 tablet (81 mg total) by mouth daily.    . divalproex (DEPAKOTE) 250 MG DR tablet Take 1 tablet (250 mg total) by mouth at bedtime. 30 tablet 2  . loratadine (CLARITIN) 10 MG tablet TAKE 1 TABLET BY MOUTH DAILY 30 tablet 0  . metoprolol tartrate (LOPRESSOR) 25 MG tablet TAKE 1 TABLET (25 MG TOTAL) BY MOUTH 2 (TWO) TIMES DAILY. 180 tablet 1  . mirtazapine (REMERON) 30 MG tablet Take 1 tablet (30 mg total) by mouth at bedtime. 30 tablet 2  . risperiDONE (RISPERDAL) 2 MG tablet TAKE 1 TABLET IN THE MORNING AND TAKE 2 TABLETS AT BEDTIME 90 tablet 2  . simvastatin (ZOCOR) 40 MG tablet Take 1 tablet (40 mg total) by mouth daily. Office visit due now 90 tablet 1  . traZODone (DESYREL) 100 MG tablet Take 1 tablet (100 mg total) by mouth at bedtime. 30 tablet 2   No current facility-administered medications for this visit.     Neurologic: Headache: No Seizure: No Paresthesias: No  Musculoskeletal: Strength & Muscle Tone: within normal limits Gait & Station: normal Patient leans: N/A  Psychiatric Specialty Exam: Review of Systems  Constitutional: Negative.   HENT: Negative.   Musculoskeletal: Negative.   Skin: Negative.   Neurological: Negative.     Blood pressure 128/74, pulse 95, height 5\' 11"  (1.803 m), weight 232 lb 12.8 oz (105.6 kg).Body mass index is 32.47 kg/m.  General Appearance: Fairly Groomed and Guarded  Eye Contact:  Fair  Speech:  At times rambling  Volume:  Decreased  Mood:  Anxious  Affect:  Labile  Thought Process:  Descriptions of Associations: Circumstantial  Orientation:  Full (Time, Place, and Person)  Thought Content: Rumination   Suicidal Thoughts:  No  Homicidal Thoughts:  No  Memory:  Immediate;   Fair Recent;   Fair Remote;   Fair  Judgement:  Fair  Insight:  Good  Psychomotor Activity:  Normal  Concentration:  Concentration: Fair and Attention Span: Fair  Recall:  [name] of Knowledge: Fair  Language: Good  Akathisia:  No   Handed:  Right  AIMS (if indicated):  0  Assets:  Communication Skills Desire for Improvement Housing  ADL's:  Intact  Cognition: WNL  Sleep:  Improved     Assessment: Schizophrenia chronic paranoid type.  Cannabis abuse.  Plan: Patient is doing better since the dose adjusted on his last visit.  He is taking Risperdal 2 mg in the morning and 4 mg at bedtime.  He is also taking Depakote 250 mg at bedtime to help his mood lability. His creatinine is also improved from the past.  Patient is scheduled to see a nephrologist on June 4.  I will continue Remeron 30 mg at bedtime, trazodone  100 mg at bedtime, Risperdal 2 mg in the morning and 4 mg at bedtime and Depakote 250 mg at bedtime.  Discussed medication side effects and benefits.  Patient does not want to go ACT at services and like to continue his treatment in this office.  He is feeling much better since his family friend is helping and supervising his medication.  Discuss safety concern that anytime having active suicidal thoughts or homicidal thought that he need to call 911 or go to the local emergency room.  Follow-up in 3 months.  Tiffiney Sparrow T., MD 08/03/2016, 3:02 PM

## 2016-08-18 ENCOUNTER — Other Ambulatory Visit: Payer: Self-pay | Admitting: Nephrology

## 2016-08-18 DIAGNOSIS — N183 Chronic kidney disease, stage 3 unspecified: Secondary | ICD-10-CM

## 2016-08-20 ENCOUNTER — Ambulatory Visit
Admission: RE | Admit: 2016-08-20 | Discharge: 2016-08-20 | Disposition: A | Discharge status: Home / Self Care | Payer: Medicare HMO | Source: Ambulatory Visit | Attending: Nephrology | Admitting: Nephrology

## 2016-08-20 DIAGNOSIS — N183 Chronic kidney disease, stage 3 unspecified: Secondary | ICD-10-CM

## 2016-08-21 ENCOUNTER — Other Ambulatory Visit: Payer: Self-pay | Admitting: Family Medicine

## 2016-08-21 DIAGNOSIS — J302 Other seasonal allergic rhinitis: Secondary | ICD-10-CM

## 2016-08-31 ENCOUNTER — Other Ambulatory Visit: Payer: Self-pay

## 2016-09-17 ENCOUNTER — Other Ambulatory Visit (HOSPITAL_COMMUNITY): Payer: Self-pay | Admitting: Psychiatry

## 2016-09-17 DIAGNOSIS — F209 Schizophrenia, unspecified: Secondary | ICD-10-CM

## 2016-10-07 ENCOUNTER — Telehealth (HOSPITAL_COMMUNITY): Payer: Self-pay

## 2016-10-07 NOTE — Telephone Encounter (Signed)
Patients caregiver states that he is not using around him, however patient did go to his mothers house a couple of days ago and the caregiver does not know who patient had access to. So, it is possible he used a couple of days ago.

## 2016-10-07 NOTE — Telephone Encounter (Signed)
Patient has history of smoking marijuana.  Please inquire if he is still smoking cannabis.  He may need to see earlier than his is scheduled appointment if symptoms do not improve after stopping cannabis.

## 2016-10-07 NOTE — Telephone Encounter (Signed)
Patients caregiver came in today and he states that patient has not been sleeping and has been talking and laughing to himself. Not watching television (which is not normal) and his eyes are really big. Caregiver states that he is taking his medication, he watches him take it. Please review and advise, thank you

## 2016-11-04 ENCOUNTER — Encounter (HOSPITAL_COMMUNITY): Payer: Self-pay | Admitting: Psychiatry

## 2016-11-04 ENCOUNTER — Ambulatory Visit (INDEPENDENT_AMBULATORY_CARE_PROVIDER_SITE_OTHER): Payer: Medicare HMO | Admitting: Psychiatry

## 2016-11-04 DIAGNOSIS — F209 Schizophrenia, unspecified: Secondary | ICD-10-CM | POA: Diagnosis not present

## 2016-11-04 DIAGNOSIS — F1210 Cannabis abuse, uncomplicated: Secondary | ICD-10-CM | POA: Diagnosis not present

## 2016-11-04 DIAGNOSIS — F17210 Nicotine dependence, cigarettes, uncomplicated: Secondary | ICD-10-CM

## 2016-11-04 MED ORDER — DIVALPROEX SODIUM 250 MG PO DR TAB: 250.0000 mg | DELAYED_RELEASE_TABLET | Freq: Every day | ORAL | 2 refills | Status: DC

## 2016-11-04 MED ORDER — TRAZODONE HCL 100 MG PO TABS: 100.0000 mg | ORAL_TABLET | Freq: Every day | ORAL | 2 refills | Status: DC

## 2016-11-04 MED ORDER — RISPERIDONE 2 MG PO TABS: ORAL_TABLET | ORAL | 2 refills | Status: DC

## 2016-11-04 MED ORDER — MIRTAZAPINE 30 MG PO TABS: 30.0000 mg | ORAL_TABLET | Freq: Every day | ORAL | 2 refills | Status: DC

## 2016-11-04 NOTE — Progress Notes (Signed)
BH MD/PA/NP OP Progress Note  11/04/2016 2:54 PM [name] [name].  MRN:  [id number]  Chief Complaint:  I'm doing good.  I'm sleeping better.  I cut down her marijuana use.  HPI: [name] [name] came for his follow-up appointment.  He came with his caretaker.  He is doing much better since medicine adjusted.  He cut down his marijuana smoking and since the last visit he smoked twice.  Recently he seen his nephrologist.  His creatinine is 1.68 which was done on June 5.  Overall he reported his mood is good.  He denies any irritability, anger, mania, psychosis or any hallucination.  His caretaker also believe that he is doing much better on his medication.  He cut down his energy drink but continues to drink soda to 3 times a day.  His weight has been stable.  He has no tremors, shakes or any EPS.  He is not interested in counseling.  He continues to go to his mother house to help her but usually like to stay in the group home.  His energy level is okay.  His bowel sounds are stable.  He denies any suicidal thoughts or homicidal thought.  Visit Diagnosis:    ICD-10-CM   1. Schizophrenia, unspecified type ([place]) F20.9 traZODone (DESYREL) 100 MG tablet    risperiDONE (RISPERDAL) 2 MG tablet    mirtazapine (REMERON) 30 MG tablet    divalproex (DEPAKOTE) 250 MG DR tablet    Past Psychiatric History: Reviewed. Patient has history of psychiatric illness since age 89 when he was immediately.  He had history of irrational and bizarre behavior. He has multiple psychiatric hospitalization. He was admitted at [place] [facility]. His last hospitalization was in March at [facility]. Most of the hospitalization was due to non-compliance with medication. In the past he has taken Abilify, Lexapro, Neurontin.  We have recommended injectable but his insurance does not approve.  Patient denies any history of suicidal attempt. As per record he has history of paranoia,  hallucination and bizarre behavior.  Past Medical History:  Past Medical History:  Diagnosis Date  . Diabetes mellitus type II, uncontrolled ([place])   . HTN (hypertension)   . Hyperlipidemia LDL goal <70   . Schizophrenia ([place])    No past surgical history on file.  Family Psychiatric History: Reviewed.  Family History:  Family History  Problem Relation Age of Onset  . Breast cancer Mother   . Diabetes Mother   . Prostate cancer Father        StepFather  . Hypertension Father   . Breast cancer Maternal Aunt   . Diabetes Maternal Uncle     Social History:  Social History   Social History  . Marital status: Single    Spouse name: N/A  . Number of children: N/A  . Years of education: N/A   Social History Main Topics  . Smoking status: Current Every Day Smoker    Packs/day: 1.00    Years: 30.00    Types: Cigarettes, Cigars  . Smokeless tobacco: Never Used     Comment: cigarettes-1 pack every couple of days, cigars-pack a day  . Alcohol use No     Comment: no longer  . Drug use: Yes    Types: Marijuana     Comment: not currently  . Sexual activity: No   Other Topics Concern  . Not on file   Social History Narrative   Lives with mother  unemployed   No children    Allergies: No Known Allergies  Metabolic Disorder Labs: Lab Results  Component Value Date   HGBA1C 6.5 07/13/2016   No results found for: PROLACTIN Lab Results  Component Value Date   CHOL 119 07/13/2016   TRIG 144.0 07/13/2016   HDL 39.10 07/13/2016   CHOLHDL 3 07/13/2016   VLDL 28.8 07/13/2016   LDLCALC 52 07/13/2016   LDLCALC 107 (H) 06/02/2015   Lab Results  Component Value Date   TSH 1.38 07/13/2016   TSH 1.49 06/02/2015    Therapeutic Level Labs: No results found for: LITHIUM No results found for: VALPROATE No components found for:  CBMZ  Current Medications: Current Outpatient Prescriptions  Medication Sig Dispense Refill  . ALLERGY RELIEF 10 MG tablet TAKE 1 TABLET BY  MOUTH EVERY DAY 30 tablet 0  . aspirin EC 81 MG tablet Take 1 tablet (81 mg total) by mouth daily.    . divalproex (DEPAKOTE) 250 MG DR tablet Take 1 tablet (250 mg total) by mouth at bedtime. 30 tablet 2  . metoprolol tartrate (LOPRESSOR) 25 MG tablet TAKE 1 TABLET (25 MG TOTAL) BY MOUTH 2 (TWO) TIMES DAILY. 180 tablet 1  . mirtazapine (REMERON) 30 MG tablet Take 1 tablet (30 mg total) by mouth at bedtime. 30 tablet 2  . risperiDONE (RISPERDAL) 2 MG tablet TAKE 1 TABLET IN THE MORNING AND TAKE 2 TABLETS AT BEDTIME 90 tablet 2  . simvastatin (ZOCOR) 40 MG tablet Take 1 tablet (40 mg total) by mouth daily. Office visit due now 90 tablet 1  . traZODone (DESYREL) 100 MG tablet Take 1 tablet (100 mg total) by mouth at bedtime. 30 tablet 2   No current facility-administered medications for this visit.      Musculoskeletal: Strength & Muscle Tone: within normal limits Gait & Station: normal Patient leans: N/A  Psychiatric Specialty Exam: ROS  Blood pressure 128/72, pulse 92, height 5' 9.5" (1.765 m), weight 234 lb (106.1 kg).There is no height or weight on file to calculate BMI.  General Appearance: Fairly Groomed  Eye Contact:  Fair  Speech:  Slow  Volume:  Normal  Mood:  Anxious  Affect:  Labile  Thought Process:  Descriptions of Associations: Circumstantial  Orientation:  Full (Time, Place, and Person)  Thought Content: Rumination   Suicidal Thoughts:  No  Homicidal Thoughts:  No  Memory:  Immediate;   Fair Recent;   Fair Remote;   Fair  Judgement:  Fair  Insight:  Good  Psychomotor Activity:  Normal  Concentration:  Concentration: Fair and Attention Span: Fair  Recall:  [name] of Knowledge: Good  Language: Fair  Akathisia:  No  Handed:  Right  AIMS (if indicated): not done  Assets:  Communication Skills Desire for Improvement Housing  ADL's:  Intact  Cognition: WNL  Sleep:  Good   Screenings:   Assessment and Plan: Patient is 51 year old with a diagnosis of  schizophrenia chronic paranoid type and cannabis abuse.  Patient is a stable on his current psychiatric medication.  He has no side effects.  Recommended to cut down his marijuana use and take his medication as prescribed.  He is taking Risperdal 2 mg in the morning and 4 mg at bedtime.  I will continue Risperdal along with Depakote 250 mg at bedtime, Remeron 30 mg at bedtime, trazodone 100 mg at bedtime.  Discussed medication side effects and benefits.  His creatinine is improved from the past.  Recommended to  call us back if he has any question or any concern.  He is not interested in counseling.  Follow-up in 3 months.   Caliber Landess T., MD 11/04/2016, 2:54 PM

## 2016-12-02 ENCOUNTER — Other Ambulatory Visit: Payer: Self-pay | Admitting: Family Medicine

## 2016-12-02 DIAGNOSIS — E785 Hyperlipidemia, unspecified: Secondary | ICD-10-CM

## 2016-12-23 ENCOUNTER — Other Ambulatory Visit: Payer: Self-pay | Admitting: Family Medicine

## 2016-12-23 DIAGNOSIS — I10 Essential (primary) hypertension: Secondary | ICD-10-CM

## 2017-01-24 ENCOUNTER — Encounter: Payer: Self-pay | Admitting: Family Medicine

## 2017-01-24 ENCOUNTER — Ambulatory Visit: Payer: Medicare HMO | Admitting: Family Medicine

## 2017-01-24 ENCOUNTER — Ambulatory Visit (HOSPITAL_BASED_OUTPATIENT_CLINIC_OR_DEPARTMENT_OTHER)
Admission: RE | Admit: 2017-01-24 | Discharge: 2017-01-24 | Disposition: A | Discharge status: Home / Self Care | Payer: Medicare HMO | Source: Ambulatory Visit | Attending: Family Medicine | Admitting: Family Medicine

## 2017-01-24 VITALS — BP 104/76 | HR 90 | Temp 98.4°F | Resp 16 | Ht 70.0 in | Wt 246.6 lb

## 2017-01-24 DIAGNOSIS — I10 Essential (primary) hypertension: Secondary | ICD-10-CM | POA: Diagnosis not present

## 2017-01-24 DIAGNOSIS — J302 Other seasonal allergic rhinitis: Secondary | ICD-10-CM

## 2017-01-24 DIAGNOSIS — E1165 Type 2 diabetes mellitus with hyperglycemia: Secondary | ICD-10-CM

## 2017-01-24 DIAGNOSIS — R059 Cough, unspecified: Secondary | ICD-10-CM

## 2017-01-24 DIAGNOSIS — R05 Cough: Secondary | ICD-10-CM | POA: Diagnosis not present

## 2017-01-24 DIAGNOSIS — E785 Hyperlipidemia, unspecified: Secondary | ICD-10-CM

## 2017-01-24 DIAGNOSIS — J40 Bronchitis, not specified as acute or chronic: Secondary | ICD-10-CM | POA: Diagnosis not present

## 2017-01-24 LAB — POC URINALSYSI DIPSTICK (AUTOMATED)
Bilirubin, UA: NEGATIVE
GLUCOSE UA: NEGATIVE
Ketones, UA: NEGATIVE
Leukocytes, UA: NEGATIVE
NITRITE UA: NEGATIVE
Protein, UA: NEGATIVE
RBC UA: NEGATIVE
SPEC GRAV UA: 1.01 (ref 1.010–1.025)
UROBILINOGEN UA: 0.2 U/dL
pH, UA: 6 (ref 5.0–8.0)

## 2017-01-24 LAB — GLUCOSE, POCT (MANUAL RESULT ENTRY): POC GLUCOSE: 69 mg/dL — AB (ref 70–99)

## 2017-01-24 NOTE — Patient Instructions (Signed)

## 2017-01-24 NOTE — Progress Notes (Signed)
Patient ID: [name] [name]., male   DOB: 01/18/1966, 51 y.o.   MRN: [id number]     Subjective:  I acted as a Education administrator for Dr. Carollee Herter.  [name] [name], [place]   Patient ID: [name] [name]., male    DOB: 02/20/1966, 51 y.o.   MRN: [id number]  Chief Complaint  Patient presents with  . Hyperlipidemia  . Hypertension  . Diabetes    HPI  Patient is in today for follow up blood pressure, cholesterol, and diabetes.  He has not checked blood sugars at home.  Care taker is worried about him drinking so much fluids and if taking alka seltzer three times a day every day for a long time would cause this.  Patient Care Team: Carollee Herter, Alferd Apa, DO as PCP - General (Family Medicine)   Past Medical History:  Diagnosis Date  . Diabetes mellitus type II, uncontrolled ([place])   . HTN (hypertension)   . Hyperlipidemia LDL goal <70   . Schizophrenia ([place])     History reviewed. No pertinent surgical history.  Family History  Problem Relation Age of Onset  . Breast cancer Mother   . Diabetes Mother   . Prostate cancer Father        StepFather  . Hypertension Father   . Breast cancer Maternal Aunt   . Diabetes Maternal Uncle     Social History   Socioeconomic History  . Marital status: Single    Spouse name: Not on file  . Number of children: Not on file  . Years of education: Not on file  . Highest education level: Not on file  Social Needs  . Financial resource strain: Not on file  . Food insecurity - worry: Not on file  . Food insecurity - inability: Not on file  . Transportation needs - medical: Not on file  . Transportation needs - non-medical: Not on file  Occupational History  . Not on file  Tobacco Use  . Smoking status: Current Every Day Smoker    Packs/day: 2.00    Years: 30.00    Pack years: 60.00    Types: Cigarettes, Cigars  . Smokeless tobacco: Never Used  Substance and Sexual Activity  . Alcohol use: No    Comment: no longer  . Drug use: Yes    Types:  Marijuana    Comment: not currently  . Sexual activity: No  Other Topics Concern  . Not on file  Social History Narrative   Lives with mom   unemployed   No children    Outpatient Medications Prior to Visit  Medication Sig Dispense Refill  . ALLERGY RELIEF 10 MG tablet TAKE 1 TABLET BY MOUTH EVERY DAY 30 tablet 0  . aspirin EC 81 MG tablet Take 1 tablet (81 mg total) by mouth daily.    . divalproex (DEPAKOTE) 250 MG DR tablet Take 1 tablet (250 mg total) by mouth at bedtime. 30 tablet 2  . metoprolol tartrate (LOPRESSOR) 25 MG tablet TAKE 1 TABLET (25 MG TOTAL) BY MOUTH 2 (TWO) TIMES DAILY. 180 tablet 1  . mirtazapine (REMERON) 30 MG tablet Take 1 tablet (30 mg total) by mouth at bedtime. 30 tablet 2  . risperiDONE (RISPERDAL) 2 MG tablet TAKE 1 TABLET IN THE MORNING AND TAKE 2 TABLETS AT BEDTIME 90 tablet 2  . simvastatin (ZOCOR) 40 MG tablet TAKE 1 TABLET (40 MG TOTAL) BY MOUTH DAILY. OFFICE VISIT DUE NOW 90 tablet 0  . traZODone (DESYREL) 100 MG tablet Take  1 tablet (100 mg total) by mouth at bedtime. 30 tablet 2  . Vitamin D, Ergocalciferol, (DRISDOL) 50000 units CAPS capsule Take 1 capsule once a week by mouth.  2  . metoprolol tartrate (LOPRESSOR) 25 MG tablet TAKE 1 TABLET (25 MG TOTAL) BY MOUTH 2 (TWO) TIMES DAILY. 180 tablet 1   No facility-administered medications prior to visit.     No Known Allergies  Review of Systems  Constitutional: Negative for fever and malaise/fatigue.  HENT: Negative for congestion.   Eyes: Negative for blurred vision.  Respiratory: Negative for cough and shortness of breath.   Cardiovascular: Negative for chest pain, palpitations and leg swelling.  Gastrointestinal: Negative for vomiting.  Musculoskeletal: Negative for back pain.  Skin: Negative for rash.  Neurological: Negative for loss of consciousness and headaches.  Endo/Heme/Allergies: Positive for polydipsia.       Objective:    Physical Exam  BP 104/76 (BP Location: Left Arm,  Cuff Size: Large)   Pulse 90   Temp 98.4 F (36.9 C) (Oral)   Resp 16   Ht 5\' 10"  (1.778 m)   Wt 246 lb 9.6 oz (111.9 kg)   SpO2 97%   BMI 35.38 kg/m  Wt Readings from Last 3 Encounters:  01/24/17 246 lb 9.6 oz (111.9 kg)  07/13/16 233 lb (105.7 kg)  06/07/16 237 lb (107.5 kg)   BP Readings from Last 3 Encounters:  01/24/17 104/76  07/13/16 128/78  06/11/16 130/87     Immunization History  Administered Date(s) Administered  . Influenza,inj,Quad PF,6+ Mos 01/19/2016    Health Maintenance  Topic Date Due  . PNEUMOCOCCAL POLYSACCHARIDE VACCINE (1) 11/16/1969  . FOOT EXAM  11/16/1977  . OPHTHALMOLOGY EXAM  11/16/1977  . HIV Screening  11/17/1982  . TETANUS/TDAP  11/17/1986  . INFLUENZA VACCINE  10/13/2016  . HEMOGLOBIN A1C  01/13/2017  . URINE MICROALBUMIN  05/27/2017    Lab Results  Component Value Date   WBC 9.3 07/13/2016   HGB 14.6 07/13/2016   HCT 43.7 07/13/2016   PLT 496.0 (H) 07/13/2016   GLUCOSE 64 (L) 07/13/2016   CHOL 119 07/13/2016   TRIG 144.0 07/13/2016   HDL 39.10 07/13/2016   LDLDIRECT 133.0 05/27/2016   LDLCALC 52 07/13/2016   ALT 29 07/13/2016   AST 20 07/13/2016   NA 136 07/13/2016   K 4.1 07/13/2016   CL 103 07/13/2016   CREATININE 1.70 (H) 07/13/2016   BUN 14 07/13/2016   CO2 28 07/13/2016   TSH 1.38 07/13/2016   PSA 0.63 03/22/2013   HGBA1C 6.5 07/13/2016   MICROALBUR 2.2 (H) 05/27/2016    Lab Results  Component Value Date   TSH 1.38 07/13/2016   Lab Results  Component Value Date   WBC 9.3 07/13/2016   HGB 14.6 07/13/2016   HCT 43.7 07/13/2016   MCV 88.2 07/13/2016   PLT 496.0 (H) 07/13/2016   Lab Results  Component Value Date   NA 136 07/13/2016   K 4.1 07/13/2016   CO2 28 07/13/2016   GLUCOSE 64 (L) 07/13/2016   BUN 14 07/13/2016   CREATININE 1.70 (H) 07/13/2016   BILITOT 0.3 07/13/2016   ALKPHOS 62 07/13/2016   AST 20 07/13/2016   ALT 29 07/13/2016   PROT 7.5 07/13/2016   ALBUMIN 4.4 07/13/2016   CALCIUM 9.8  07/13/2016   ANIONGAP 10 06/09/2016   GFR 55.45 (L) 07/13/2016   Lab Results  Component Value Date   CHOL 119 07/13/2016   Lab Results  Component Value Date   HDL 39.10 07/13/2016   Lab Results  Component Value Date   LDLCALC 52 07/13/2016   Lab Results  Component Value Date   TRIG 144.0 07/13/2016   Lab Results  Component Value Date   CHOLHDL 3 07/13/2016   Lab Results  Component Value Date   HGBA1C 6.5 07/13/2016         Assessment & Plan:   Problem List Items Addressed This Visit      Unprioritized   HTN (hypertension)   Relevant Orders   Comprehensive metabolic panel   Hemoglobin A1c   Lipid panel   Microalbumin / creatinine urine ratio   POCT Urinalysis Dipstick (Automated)   Hyperlipidemia   Relevant Orders   Comprehensive metabolic panel   Hemoglobin A1c   Lipid panel   Microalbumin / creatinine urine ratio   POCT Urinalysis Dipstick (Automated)   Seasonal allergies    Other Visit Diagnoses    Uncontrolled type 2 diabetes mellitus with hyperglycemia (HCC)    -  Primary   Relevant Orders   Comprehensive metabolic panel   Hemoglobin A1c   Lipid panel   Microalbumin / creatinine urine ratio   POCT Urinalysis Dipstick (Automated)   Cough       Relevant Orders   DG Chest 2 View      I am having [name]. maintain his aspirin EC, metoprolol tartrate, ALLERGY RELIEF, traZODone, risperiDONE, mirtazapine, divalproex, simvastatin, and Vitamin D (Ergocalciferol).  Meds ordered this encounter  Medications  . Vitamin D, Ergocalciferol, (DRISDOL) 50000 units CAPS capsule    Sig: Take 1 capsule once a week by mouth.    Refill:  2    CMA served as Education administrator during this visit. History, Physical and Plan performed by medical provider. Documentation and orders reviewed and attested to. Ann Held, DO

## 2017-01-25 LAB — COMPREHENSIVE METABOLIC PANEL
ALT: 19 U/L (ref 0–53)
AST: 18 U/L (ref 0–37)
Albumin: 4.2 g/dL (ref 3.5–5.2)
Alkaline Phosphatase: 54 U/L (ref 39–117)
BILIRUBIN TOTAL: 0.3 mg/dL (ref 0.2–1.2)
BUN: 14 mg/dL (ref 6–23)
CALCIUM: 9.6 mg/dL (ref 8.4–10.5)
CO2: 26 mEq/L (ref 19–32)
Chloride: 103 mEq/L (ref 96–112)
Creatinine, Ser: 1.85 mg/dL — ABNORMAL HIGH (ref 0.40–1.50)
GFR: 50.18 mL/min — AB (ref >60.00)
Glucose, Bld: 56 mg/dL — ABNORMAL LOW (ref 70–99)
Potassium: 4.4 mEq/L (ref 3.5–5.1)
Sodium: 136 mEq/L (ref 135–145)
TOTAL PROTEIN: 7.5 g/dL (ref 6.0–8.3)

## 2017-01-25 LAB — LIPID PANEL
CHOL/HDL RATIO: 4
CHOLESTEROL: 143 mg/dL (ref 0–200)
HDL: 39.5 mg/dL (ref >39.00)
LDL Cholesterol: 70 mg/dL (ref 0–99)
NonHDL: 103.37
TRIGLYCERIDES: 166 mg/dL — AB (ref 0.0–149.0)
VLDL: 33.2 mg/dL (ref 0.0–40.0)

## 2017-01-25 LAB — HEMOGLOBIN A1C: Hgb A1c MFr Bld: 7 % — ABNORMAL HIGH (ref 4.6–6.5)

## 2017-01-25 LAB — MICROALBUMIN / CREATININE URINE RATIO
CREATININE, U: 43 mg/dL
MICROALB UR: 5.1 mg/dL — AB (ref 0.0–1.9)
MICROALB/CREAT RATIO: 11.9 mg/g (ref 0.0–30.0)

## 2017-02-02 ENCOUNTER — Ambulatory Visit (HOSPITAL_COMMUNITY): Payer: Self-pay | Admitting: Psychiatry

## 2017-02-02 ENCOUNTER — Other Ambulatory Visit: Payer: Self-pay | Admitting: *Deleted

## 2017-02-02 ENCOUNTER — Telehealth: Payer: Self-pay | Admitting: Family Medicine

## 2017-02-02 MED ORDER — BUDESONIDE-FORMOTEROL FUMARATE 80-4.5 MCG/ACT IN AERO: 2.0000 | INHALATION_SPRAY | Freq: Two times a day (BID) | RESPIRATORY_TRACT | 0 refills | Status: DC

## 2017-02-02 NOTE — Telephone Encounter (Signed)
Results reviewed with patient's mother, who is on record as DPR, including Symbicort prescription sent by physician.

## 2017-02-09 ENCOUNTER — Encounter (HOSPITAL_COMMUNITY): Payer: Self-pay | Admitting: Psychiatry

## 2017-02-09 ENCOUNTER — Ambulatory Visit (INDEPENDENT_AMBULATORY_CARE_PROVIDER_SITE_OTHER): Payer: Medicare HMO | Admitting: Psychiatry

## 2017-02-09 DIAGNOSIS — F1210 Cannabis abuse, uncomplicated: Secondary | ICD-10-CM

## 2017-02-09 DIAGNOSIS — Z79899 Other long term (current) drug therapy: Secondary | ICD-10-CM | POA: Diagnosis not present

## 2017-02-09 DIAGNOSIS — F209 Schizophrenia, unspecified: Secondary | ICD-10-CM

## 2017-02-09 DIAGNOSIS — F17210 Nicotine dependence, cigarettes, uncomplicated: Secondary | ICD-10-CM | POA: Diagnosis not present

## 2017-02-09 DIAGNOSIS — F17290 Nicotine dependence, other tobacco product, uncomplicated: Secondary | ICD-10-CM

## 2017-02-09 MED ORDER — TRAZODONE HCL 100 MG PO TABS: 100.0000 mg | ORAL_TABLET | Freq: Every day | ORAL | 2 refills | Status: DC

## 2017-02-09 MED ORDER — MIRTAZAPINE 30 MG PO TABS: 30.0000 mg | ORAL_TABLET | Freq: Every day | ORAL | 2 refills | Status: DC

## 2017-02-09 MED ORDER — DIVALPROEX SODIUM 250 MG PO DR TAB: 250.0000 mg | DELAYED_RELEASE_TABLET | Freq: Every day | ORAL | 2 refills | Status: DC

## 2017-02-09 MED ORDER — RISPERIDONE 2 MG PO TABS: ORAL_TABLET | ORAL | 2 refills | Status: DC

## 2017-02-09 NOTE — Progress Notes (Signed)
BH MD/PA/NP OP Progress Note  02/09/2017 12:43 PM [name] [name].  MRN:  [id number]  Chief Complaint: I am doing better.  I am taking my medication.  HPI: Patient came for his follow-up appointment with his caretaker.  He is compliant with medication and denies any side effects.  He had a good Thanksgiving.  Recently he seen his primary care physician and he has blood work.  He denies any irritability, anger, mania or any psychosis.  He cut down his cannabis use and recall smoking once in past few months.  He is sleeping good.  He has no tremor or shakes or any EPS.  He wants to continue his current psychiatric medication.  His caretaker also endorsed that he is doing much better on his medication and recently there has been no agitation anger.  His weight is a stable.  Patient is not interested in counseling.  He continues to visit his mother to help her out but is usually like to stay in the group home.  His energy level is good.  His vital signs are stable.  Visit Diagnosis:    ICD-10-CM   1. Schizophrenia, unspecified type ([place]) F20.9 traZODone (DESYREL) 100 MG tablet    risperiDONE (RISPERDAL) 2 MG tablet    mirtazapine (REMERON) 30 MG tablet    divalproex (DEPAKOTE) 250 MG DR tablet    Past Psychiatric History: Reviewed. Patient has history of psychiatric illness since age 62 when he was immediately.  He had history of irrational and bizarre behavior. He has multiple psychiatric hospitalization. He was [facility] at [facility]. His last hospitalization was in March at [facility]. Most of the hospitalization was due to non-compliance with medication. In the past he has taken Abilify, Lexapro, Neurontin. We have recommended injectable but his insurance does not approve. Patient denies any history of suicidal attempt. As per record he has history of paranoia, hallucination and bizarre behavior.  Past Medical History:  Past  Medical History:  Diagnosis Date  . Diabetes mellitus type II, uncontrolled ([place])   . HTN (hypertension)   . Hyperlipidemia LDL goal <70   . Schizophrenia ([place])    No past surgical history on file.  Family Psychiatric History: Reviewed.  Family History:  Family History  Problem Relation Age of Onset  . Breast cancer Mother   . Diabetes Mother   . Prostate cancer Father        StepFather  . Hypertension Father   . Breast cancer Maternal Aunt   . Diabetes Maternal Uncle     Social History:  Social History   Socioeconomic History  . Marital status: Single    Spouse name: Not on file  . Number of children: Not on file  . Years of education: Not on file  . Highest education level: Not on file  Social Needs  . Financial resource strain: Not on file  . Food insecurity - worry: Not on file  . Food insecurity - inability: Not on file  . Transportation needs - medical: Not on file  . Transportation needs - non-medical: Not on file  Occupational History  . Not on file  Tobacco Use  . Smoking status: Current Every Day Smoker    Packs/day: 2.00    Years: 30.00    Pack years: 60.00    Types: Cigarettes, Cigars  . Smokeless tobacco: Never Used  Substance and Sexual Activity  . Alcohol use: No    Comment: no longer  .  Drug use: Yes    Types: Marijuana    Comment: not currently  . Sexual activity: No  Other Topics Concern  . Not on file  Social History Narrative   Lives with mom   unemployed   No children    Allergies: No Known Allergies  Metabolic Disorder Labs: Lab Results  Component Value Date   HGBA1C 7.0 (H) 01/24/2017   No results found for: PROLACTIN Lab Results  Component Value Date   CHOL 143 01/24/2017   TRIG 166.0 (H) 01/24/2017   HDL 39.50 01/24/2017   CHOLHDL 4 01/24/2017   VLDL 33.2 01/24/2017   LDLCALC 70 01/24/2017   LDLCALC 52 07/13/2016   Lab Results  Component Value Date   TSH 1.38 07/13/2016   TSH 1.49 06/02/2015    Therapeutic  Level Labs: No results found for: LITHIUM No results found for: VALPROATE No components found for:  CBMZ  Current Medications: Current Outpatient Medications  Medication Sig Dispense Refill  . ALLERGY RELIEF 10 MG tablet TAKE 1 TABLET BY MOUTH EVERY DAY 30 tablet 0  . aspirin EC 81 MG tablet Take 1 tablet (81 mg total) by mouth daily.    . budesonide-formoterol (SYMBICORT) 80-4.5 MCG/ACT inhaler Inhale 2 puffs into the lungs 2 (two) times daily. 1 Inhaler 0  . divalproex (DEPAKOTE) 250 MG DR tablet Take 1 tablet (250 mg total) by mouth at bedtime. 30 tablet 2  . metoprolol tartrate (LOPRESSOR) 25 MG tablet TAKE 1 TABLET (25 MG TOTAL) BY MOUTH 2 (TWO) TIMES DAILY. 180 tablet 1  . mirtazapine (REMERON) 30 MG tablet Take 1 tablet (30 mg total) by mouth at bedtime. 30 tablet 2  . risperiDONE (RISPERDAL) 2 MG tablet TAKE 1 TABLET IN THE MORNING AND TAKE 2 TABLETS AT BEDTIME 90 tablet 2  . simvastatin (ZOCOR) 40 MG tablet TAKE 1 TABLET (40 MG TOTAL) BY MOUTH DAILY. OFFICE VISIT DUE NOW 90 tablet 0  . traZODone (DESYREL) 100 MG tablet Take 1 tablet (100 mg total) by mouth at bedtime. 30 tablet 2  . Vitamin D, Ergocalciferol, (DRISDOL) 50000 units CAPS capsule Take 1 capsule once a week by mouth.  2   No current facility-administered medications for this visit.      Musculoskeletal: Strength & Muscle Tone: within normal limits Gait & Station: normal Patient leans: N/A  Psychiatric Specialty Exam: ROS  Blood pressure 132/74, pulse 87, height 5' 10.5" (1.791 m), weight 247 lb 3.2 oz (112.1 kg).Body mass index is 34.97 kg/m.  General Appearance: Fairly Groomed  Eye Contact:  Fair  Speech:  Slow  Volume:  Normal  Mood:  Anxious  Affect:  Labile  Thought Process:  Descriptions of Associations: Circumstantial  Orientation:  Full (Time, Place, and Person)  Thought Content: Logical   Suicidal Thoughts:  No  Homicidal Thoughts:  No  Memory:  Immediate;   Fair Recent;   Fair Remote;   Fair   Judgement:  Good  Insight:  Good  Psychomotor Activity:  Normal  Concentration:  Concentration: Fair and Attention Span: Fair  Recall:  Good  Fund of Knowledge: Fair  Language: Fair  Akathisia:  No  Handed:  Right  AIMS (if indicated): not done  Assets:  Communication Skills Desire for Improvement Housing  ADL's:  Intact  Cognition: WNL  Sleep:  Good   Screenings:   Assessment and Plan: Schizophrenia chronic paranoid type.  Cannabis abuse.  Patient is stable on his current psychiatric medication.  He has no tremors or  shakes or any EPS.  I reviewed records from his primary care physician.  Patient is compliant with his diabetes medication.  Continue Risperdal 2 mg in the morning and 4 mg at bedtime, Remeron 30 mg at bedtime, trazodone 100 mg at bedtime and Depakote to 50 mg daily.  Recommended to call us back if there is any question or any concern.  He is not interested in counseling.  Follow-up in 3 months.   Kathlee Nations, MD 02/09/2017, 12:43 PM

## 2017-02-11 ENCOUNTER — Telehealth: Payer: Self-pay | Admitting: Family Medicine

## 2017-02-11 NOTE — Telephone Encounter (Signed)
Copied from [place]. Topic: Quick Communication - Rx Refill/Question >> Feb 11, 2017  1:17 PM Marin Olp L wrote: Has the patient contacted their pharmacy? Yes.   (Agent: If no, request that the patient contact the pharmacy for the refill.) Preferred Pharmacy (with phone number or street name): [id number] - [place], [place] Agent: Please be advised that RX refills may take up to 48 hours. We ask that you follow-up with your pharmacy. budesonide-formoterol (SYMBICORT) 80-4.5 MCG/ACT inhaler was $200 which is too expensive. Are there any samples or options?

## 2017-02-11 NOTE — Telephone Encounter (Signed)
error 

## 2017-02-11 NOTE — Telephone Encounter (Signed)
Request Rx alternative to Symbicort

## 2017-02-11 NOTE — Telephone Encounter (Deleted)
Copied from [place]. Topic: Quick Communication - Rx Refill/Question >> Feb 11, 2017  1:17 PM Marin Olp L wrote: Has the patient contacted their pharmacy? Yes.   (Agent: If no, request that the patient contact the pharmacy for the refill.) Preferred Pharmacy (with phone number or street name): [id number] - [place], [place] Agent: Please be advised that RX refills may take up to 48 hours. We ask that you follow-up with your pharmacy. budesonide-formoterol (SYMBICORT) 80-4.5 MCG/ACT inhaler was $200 which is too expensive. Are there any samples or options?

## 2017-02-14 NOTE — Telephone Encounter (Signed)
See if ins will pay for advair -- we may need to see formulary

## 2017-02-14 NOTE — Telephone Encounter (Signed)
Patients mother states they will call insuance company to find out what is covered and call back for RX.

## 2017-03-01 ENCOUNTER — Other Ambulatory Visit: Payer: Self-pay | Admitting: Family Medicine

## 2017-03-01 DIAGNOSIS — E785 Hyperlipidemia, unspecified: Secondary | ICD-10-CM

## 2017-05-12 ENCOUNTER — Ambulatory Visit (INDEPENDENT_AMBULATORY_CARE_PROVIDER_SITE_OTHER): Payer: Medicare HMO | Admitting: Psychiatry

## 2017-05-12 ENCOUNTER — Encounter (HOSPITAL_COMMUNITY): Payer: Self-pay | Admitting: Psychiatry

## 2017-05-12 DIAGNOSIS — F1290 Cannabis use, unspecified, uncomplicated: Secondary | ICD-10-CM | POA: Diagnosis not present

## 2017-05-12 DIAGNOSIS — F209 Schizophrenia, unspecified: Secondary | ICD-10-CM | POA: Diagnosis not present

## 2017-05-12 DIAGNOSIS — F17210 Nicotine dependence, cigarettes, uncomplicated: Secondary | ICD-10-CM

## 2017-05-12 MED ORDER — RISPERIDONE 2 MG PO TABS: ORAL_TABLET | ORAL | 2 refills | Status: DC

## 2017-05-12 MED ORDER — MIRTAZAPINE 30 MG PO TABS: 30.0000 mg | ORAL_TABLET | Freq: Every day | ORAL | 2 refills | Status: DC

## 2017-05-12 MED ORDER — DIVALPROEX SODIUM 250 MG PO DR TAB: 250.0000 mg | DELAYED_RELEASE_TABLET | Freq: Every day | ORAL | 2 refills | Status: DC

## 2017-05-12 MED ORDER — TRAZODONE HCL 100 MG PO TABS: 100.0000 mg | ORAL_TABLET | Freq: Every day | ORAL | 2 refills | Status: DC

## 2017-05-12 NOTE — Progress Notes (Signed)
BH MD/PA/NP OP Progress Note  05/12/2017 3:08 PM [name] [name].  MRN:  [id number]  Chief Complaint: I am doing fine.  I moved back to my mom's house.  HPI: Patient came with his friend for his follow-up appointment.  He is very happy and excited because he moved back to his mother's house for Christmas.  He endorsed holidays was very good.  He is sleeping good.  He takes his medication denies any side effects.  He continues to smoke 1 pack a day but he feels proud that he has not smoked marijuana in the past few months.  Patient denies any irritability, anger, mania or any psychosis.  He is sleeping better.  He denies any agitation or any self abusive behavior.  He is not interested in counseling.  His friend told that he is adjusting very well to his mother's house and there has been no recent problem.  Patient denies drinking.  His appetite is okay.  His vital signs are stable.    Visit Diagnosis:    ICD-10-CM   1. Schizophrenia, unspecified type ([place]) F20.9 traZODone (DESYREL) 100 MG tablet    risperiDONE (RISPERDAL) 2 MG tablet    mirtazapine (REMERON) 30 MG tablet    Past Psychiatric History: Reviewed. Patient has history of psychiatric illness since age 56when he was immediately. He had history of irrational and bizarre behavior. He has multiple psychiatric hospitalization. He was admitted at [facility]. Hislast hospitalization was in March at [facility]. Most of the hospitalization was due to non-compliance with medication. In the past he has taken Abilify, Lexapro, Neurontin. We have recommended injectable but his insurance does not approve. Patient denies any history of suicidal attempt. As per record he has history of paranoia, hallucination and bizarre behavior.  Past Medical History:  Past Medical History:  Diagnosis Date  . Diabetes mellitus type II, uncontrolled ([place])   . HTN (hypertension)   . Hyperlipidemia  LDL goal <70   . Schizophrenia ([place])    No past surgical history on file.  Family Psychiatric History: Reviewed.  Family History:  Family History  Problem Relation Age of Onset  . Breast cancer Mother   . Diabetes Mother   . Prostate cancer Father        StepFather  . Hypertension Father   . Breast cancer Maternal Aunt   . Diabetes Maternal Uncle     Social History:  Social History   Socioeconomic History  . Marital status: Single    Spouse name: Not on file  . Number of children: Not on file  . Years of education: Not on file  . Highest education level: Not on file  Social Needs  . Financial resource strain: Not on file  . Food insecurity - worry: Not on file  . Food insecurity - inability: Not on file  . Transportation needs - medical: Not on file  . Transportation needs - non-medical: Not on file  Occupational History  . Not on file  Tobacco Use  . Smoking status: Current Every Day Smoker    Packs/day: 2.00    Years: 30.00    Pack years: 60.00    Types: Cigarettes, Cigars  . Smokeless tobacco: Never Used  Substance and Sexual Activity  . Alcohol use: No    Comment: no longer  . Drug use: Yes    Types: Marijuana    Comment: not currently  . Sexual activity: No  Other Topics Concern  . Not  on file  Social History Narrative   Lives with mom   unemployed   No children    Allergies: No Known Allergies  Metabolic Disorder Labs: Lab Results  Component Value Date   HGBA1C 7.0 (H) 01/24/2017   No results found for: PROLACTIN Lab Results  Component Value Date   CHOL 143 01/24/2017   TRIG 166.0 (H) 01/24/2017   HDL 39.50 01/24/2017   CHOLHDL 4 01/24/2017   VLDL 33.2 01/24/2017   LDLCALC 70 01/24/2017   LDLCALC 52 07/13/2016   Lab Results  Component Value Date   TSH 1.38 07/13/2016   TSH 1.49 06/02/2015    Therapeutic Level Labs: No results found for: LITHIUM No results found for: VALPROATE No components found for:  CBMZ  Current  Medications: Current Outpatient Medications  Medication Sig Dispense Refill  . ALLERGY RELIEF 10 MG tablet TAKE 1 TABLET BY MOUTH EVERY DAY 30 tablet 0  . aspirin EC 81 MG tablet Take 1 tablet (81 mg total) by mouth daily.    . budesonide-formoterol (SYMBICORT) 80-4.5 MCG/ACT inhaler Inhale 2 puffs into the lungs 2 (two) times daily. 1 Inhaler 0  . divalproex (DEPAKOTE) 250 MG DR tablet Take 1 tablet (250 mg total) by mouth at bedtime. 30 tablet 2  . metoprolol tartrate (LOPRESSOR) 25 MG tablet TAKE 1 TABLET (25 MG TOTAL) BY MOUTH 2 (TWO) TIMES DAILY. 180 tablet 1  . mirtazapine (REMERON) 30 MG tablet Take 1 tablet (30 mg total) by mouth at bedtime. 30 tablet 2  . risperiDONE (RISPERDAL) 2 MG tablet TAKE 1 TABLET IN THE MORNING AND TAKE 2 TABLETS AT BEDTIME 90 tablet 2  . simvastatin (ZOCOR) 40 MG tablet Take 1 tablet (40 mg total) by mouth daily. 90 tablet 1  . traZODone (DESYREL) 100 MG tablet Take 1 tablet (100 mg total) by mouth at bedtime. 30 tablet 2  . Vitamin D, Ergocalciferol, (DRISDOL) 50000 units CAPS capsule Take 1 capsule once a week by mouth.  2   No current facility-administered medications for this visit.      Musculoskeletal: Strength & Muscle Tone: within normal limits Gait & Station: normal Patient leans: N/A  Psychiatric Specialty Exam: ROS  There were no vitals taken for this visit.There is no height or weight on file to calculate BMI.  General Appearance: Fairly Groomed  Eye Contact:  Fair  Speech:  Slow  Volume:  Normal  Mood:  Anxious  Affect:  Congruent  Thought Process:  Descriptions of Associations: Circumstantial  Orientation:  Full (Time, Place, and Person)  Thought Content: Rumination and povrty of thought content   Suicidal Thoughts:  No  Homicidal Thoughts:  No  Memory:  Immediate;   Fair Recent;   Fair Remote;   Fair  Judgement:  Good  Insight:  Good  Psychomotor Activity:  Normal  Concentration:  Concentration: Fair and Attention Span: Fair   Recall:  [name] of Knowledge: Good  Language: Good  Akathisia:  No  Handed:  Right  AIMS (if indicated): not done  Assets:  Communication Skills Desire for Improvement Housing Social Support  ADL's:  Intact  Cognition: WNL  Sleep:  Good   Screenings:   Assessment and Plan: Schizophrenia chronic paranoid type.  Patient is stable on his current psychiatric medication.  He has no tremors, shakes, EPS or any other concern.  Continue Risperdal 2 mg in the morning and 4 mg at bedtime, Remeron 30 mg at bedtime, trazodone 500 mg at bedtime and Depakote  250 mg daily.  Recommended to call us back if is any question or any concern.  Follow-up in 3 months.  Discussed cigarette cessation and encouraged to watch his calorie intake and do regular exercise.   Kathlee Nations, MD 05/12/2017, 3:08 PM

## 2017-05-31 ENCOUNTER — Other Ambulatory Visit (HOSPITAL_COMMUNITY): Payer: Self-pay | Admitting: Psychiatry

## 2017-05-31 DIAGNOSIS — F209 Schizophrenia, unspecified: Secondary | ICD-10-CM

## 2017-07-26 ENCOUNTER — Encounter: Payer: Medicare HMO | Admitting: Family Medicine

## 2017-08-09 ENCOUNTER — Ambulatory Visit (INDEPENDENT_AMBULATORY_CARE_PROVIDER_SITE_OTHER): Payer: Medicare HMO | Admitting: Psychiatry

## 2017-08-09 ENCOUNTER — Encounter (HOSPITAL_COMMUNITY): Payer: Self-pay | Admitting: Psychiatry

## 2017-08-09 DIAGNOSIS — F209 Schizophrenia, unspecified: Secondary | ICD-10-CM

## 2017-08-09 MED ORDER — RISPERIDONE 2 MG PO TABS: ORAL_TABLET | ORAL | 2 refills | Status: DC

## 2017-08-09 MED ORDER — MIRTAZAPINE 30 MG PO TABS: 30.0000 mg | ORAL_TABLET | Freq: Every day | ORAL | 2 refills | Status: DC

## 2017-08-09 MED ORDER — TRAZODONE HCL 100 MG PO TABS: 100.0000 mg | ORAL_TABLET | Freq: Every day | ORAL | 2 refills | Status: DC

## 2017-08-09 NOTE — Progress Notes (Signed)
[place] MD/PA/NP OP Progress Note  08/09/2017 1:57 PM [name] [name].  MRN:  [id number]  Chief Complaint: I am taking my medication.  I am sleeping too much.  HPI: [name] [name] came for his follow-up appointment with his friend.  He is taking his medication.  He had gained more than 20 pounds in past 3 months.  He admitted sleeping too much.  He has decreased energy.  He continues to smoke 3 packs a week and 6 packs of cigar in a week.  His friend endorsed since he moved with his mother he has no structure in his life.  He has been eating more than usual and does not do exercise.  Recently he is seen Dr. Posey Pronto at [facility] and he was told his kidney functions are stable.  Patient denies any agitation, anger, mania, psychosis.  He likes staying with his mother and he has no recent issues or problems with the mother.  He denies any crying spells or any feeling of hopelessness or worthlessness.  He has no tremors, shakes or any EPS.  He denies drinking.  He cut down his marijuana use since past few months.  Visit Diagnosis:    ICD-10-CM   1. Schizophrenia, unspecified type ([place]) F20.9 traZODone (DESYREL) 100 MG tablet    risperiDONE (RISPERDAL) 2 MG tablet    mirtazapine (REMERON) 30 MG tablet    Past Psychiatric History: Reviewed. Patient has history of psychiatric illness since 41.  He has multiple hospitalization because of irrational bizarre behavior.  He was admitted to [facility] and [facility].  His last hospitalization was in [place].  A history of noncompliance of medication.  In the past he has taken Abilify, Lexapro, Neurontin.  We recommended injectable but his insurance does not approve.  Patient denies any history of suicidal attempt.  Past Medical History:  Past Medical History:  Diagnosis Date  . Diabetes mellitus type II, uncontrolled ([place])   . HTN (hypertension)   . Hyperlipidemia LDL goal <70   . Schizophrenia ([place])    No past  surgical history on file.  Family Psychiatric History: Reviewed.  Family History:  Family History  Problem Relation Age of Onset  . Breast cancer Mother   . Diabetes Mother   . Prostate cancer Father        StepFather  . Hypertension Father   . Breast cancer Maternal Aunt   . Diabetes Maternal Uncle     Social History:  Social History   Socioeconomic History  . Marital status: Single    Spouse name: Not on file  . Number of children: Not on file  . Years of education: Not on file  . Highest education level: Not on file  Occupational History  . Not on file  Social Needs  . Financial resource strain: Not on file  . Food insecurity:    Worry: Not on file    Inability: Not on file  . Transportation needs:    Medical: Not on file    Non-medical: Not on file  Tobacco Use  . Smoking status: Current Every Day Smoker    Packs/day: 2.00    Years: 30.00    Pack years: 60.00    Types: Cigarettes, Cigars  . Smokeless tobacco: Never Used  Substance and Sexual Activity  . Alcohol use: No    Comment: no longer  . Drug use: Yes    Types: Marijuana    Comment: not currently  . Sexual  activity: Never  Lifestyle  . Physical activity:    Days per week: Not on file    Minutes per session: Not on file  . Stress: Not on file  Relationships  . Social connections:    Talks on phone: Not on file    Gets together: Not on file    Attends religious service: Not on file    Active member of club or organization: Not on file    Attends meetings of clubs or organizations: Not on file    Relationship status: Not on file  Other Topics Concern  . Not on file  Social History Narrative   Lives with mom   unemployed   No children    Allergies: No Known Allergies  Metabolic Disorder Labs: Lab Results  Component Value Date   HGBA1C 7.0 (H) 01/24/2017   No results found for: PROLACTIN Lab Results  Component Value Date   CHOL 143 01/24/2017   TRIG 166.0 (H) 01/24/2017   HDL  39.50 01/24/2017   CHOLHDL 4 01/24/2017   VLDL 33.2 01/24/2017   LDLCALC 70 01/24/2017   LDLCALC 52 07/13/2016   Lab Results  Component Value Date   TSH 1.38 07/13/2016   TSH 1.49 06/02/2015    Therapeutic Level Labs: No results found for: LITHIUM No results found for: VALPROATE No components found for:  CBMZ  Current Medications: Current Outpatient Medications  Medication Sig Dispense Refill  . ALLERGY RELIEF 10 MG tablet TAKE 1 TABLET BY MOUTH EVERY DAY 30 tablet 0  . aspirin EC 81 MG tablet Take 1 tablet (81 mg total) by mouth daily.    . budesonide-formoterol (SYMBICORT) 80-4.5 MCG/ACT inhaler Inhale 2 puffs into the lungs 2 (two) times daily. 1 Inhaler 0  . divalproex (DEPAKOTE) 250 MG DR tablet Take 1 tablet (250 mg total) by mouth at bedtime. 30 tablet 2  . metoprolol tartrate (LOPRESSOR) 25 MG tablet TAKE 1 TABLET (25 MG TOTAL) BY MOUTH 2 (TWO) TIMES DAILY. 180 tablet 1  . mirtazapine (REMERON) 30 MG tablet Take 1 tablet (30 mg total) by mouth at bedtime. 30 tablet 2  . risperiDONE (RISPERDAL) 2 MG tablet TAKE 1 TABLET IN THE MORNING AND TAKE 2 TABLETS AT BEDTIME 90 tablet 2  . simvastatin (ZOCOR) 40 MG tablet Take 1 tablet (40 mg total) by mouth daily. 90 tablet 1  . traZODone (DESYREL) 100 MG tablet Take 1 tablet (100 mg total) by mouth at bedtime. 30 tablet 2  . Vitamin D, Ergocalciferol, (DRISDOL) 50000 units CAPS capsule Take 1 capsule once a week by mouth.  2   No current facility-administered medications for this visit.      Musculoskeletal: Strength & Muscle Tone: within normal limits Gait & Station: normal Patient leans: N/A  Psychiatric Specialty Exam: ROS  Blood pressure 115/78, pulse 93, height 5\' 10"  (1.778 m), weight 250 lb (113.4 kg), SpO2 95 %.There is no height or weight on file to calculate BMI.  General Appearance: Fairly Groomed  Eye Contact:  Fair  Speech:  Slow  Volume:  Decreased  Mood:  Anxious  Affect:  Labile  Thought Process:   Descriptions of Associations: Circumstantial  Orientation:  Full (Time, Place, and Person)  Thought Content: Rumination   Suicidal Thoughts:  No  Homicidal Thoughts:  No  Memory:  Immediate;   Fair Recent;   Fair Remote;   Fair  Judgement:  Fair  Insight:  Present  Psychomotor Activity:  Normal  Concentration:  Concentration: Fair and  Attention Span: Fair  Recall:  [name] of Knowledge: Fair  Language: Fair  Akathisia:  No  Handed:  Right  AIMS (if indicated): not done  Assets:  Communication Skills Desire for Improvement Social Support  ADL's:  Intact  Cognition: WNL  Sleep:  Good   Screenings:   Assessment and Plan: Schizophrenia chronic paranoid type.  Discussed polypharmacy.  Discussed sleep hygiene and weight loss.  Encouraged to watch his calorie intake and do regular exercise.  Cut down his cigarette smoking.  I will also discontinue Depakote so it may help weight loss.  Patient is reluctant to cut down his other medication.  Continue Risperdal 2 mg in the morning and 4 mg at bedtime, Remeron 30 mg at bedtime and trazodone 100 mg at bedtime.  We will get his consent to contact his kidney doctor to get blood work results.  Patient is scheduled to see his primary care physician in July for physical and annual blood work.  Patient is not interested in counseling.  Recommended to call us back if he has any question, concern for feel worsening of the symptoms.  Follow-up in 3 months.   Kathlee Nations, MD 08/09/2017, 1:57 PM

## 2017-08-17 ENCOUNTER — Other Ambulatory Visit (HOSPITAL_COMMUNITY): Payer: Self-pay | Admitting: Psychiatry

## 2017-08-17 DIAGNOSIS — F209 Schizophrenia, unspecified: Secondary | ICD-10-CM

## 2017-08-28 ENCOUNTER — Other Ambulatory Visit: Payer: Self-pay | Admitting: Family Medicine

## 2017-08-28 DIAGNOSIS — E785 Hyperlipidemia, unspecified: Secondary | ICD-10-CM

## 2017-09-15 ENCOUNTER — Other Ambulatory Visit: Payer: Self-pay | Admitting: Family Medicine

## 2017-09-15 DIAGNOSIS — I10 Essential (primary) hypertension: Secondary | ICD-10-CM

## 2017-09-30 ENCOUNTER — Ambulatory Visit (INDEPENDENT_AMBULATORY_CARE_PROVIDER_SITE_OTHER): Payer: Medicare HMO | Admitting: Family Medicine

## 2017-09-30 ENCOUNTER — Encounter: Payer: Self-pay | Admitting: Family Medicine

## 2017-09-30 VITALS — BP 113/73 | HR 97 | Temp 98.1°F | Resp 18 | Ht 69.5 in | Wt 250.8 lb

## 2017-09-30 DIAGNOSIS — J42 Unspecified chronic bronchitis: Secondary | ICD-10-CM

## 2017-09-30 DIAGNOSIS — Z1211 Encounter for screening for malignant neoplasm of colon: Secondary | ICD-10-CM

## 2017-09-30 DIAGNOSIS — E1151 Type 2 diabetes mellitus with diabetic peripheral angiopathy without gangrene: Secondary | ICD-10-CM

## 2017-09-30 DIAGNOSIS — I10 Essential (primary) hypertension: Secondary | ICD-10-CM | POA: Diagnosis not present

## 2017-09-30 DIAGNOSIS — N289 Disorder of kidney and ureter, unspecified: Secondary | ICD-10-CM

## 2017-09-30 DIAGNOSIS — R739 Hyperglycemia, unspecified: Secondary | ICD-10-CM

## 2017-09-30 DIAGNOSIS — Z0000 Encounter for general adult medical examination without abnormal findings: Secondary | ICD-10-CM

## 2017-09-30 DIAGNOSIS — E785 Hyperlipidemia, unspecified: Secondary | ICD-10-CM

## 2017-09-30 DIAGNOSIS — E1165 Type 2 diabetes mellitus with hyperglycemia: Secondary | ICD-10-CM

## 2017-09-30 DIAGNOSIS — IMO0002 Reserved for concepts with insufficient information to code with codable children: Secondary | ICD-10-CM

## 2017-09-30 LAB — POC HEMOCCULT BLD/STL (OFFICE/1-CARD/DIAGNOSTIC)
Card #1 Date: 7192019
Fecal Occult Blood, POC: NEGATIVE

## 2017-09-30 MED ORDER — FLUTICASONE-SALMETEROL 250-50 MCG/DOSE IN AEPB: 1.0000 | INHALATION_SPRAY | Freq: Two times a day (BID) | RESPIRATORY_TRACT | 3 refills | Status: DC

## 2017-09-30 NOTE — Assessment & Plan Note (Signed)
Per nephrology 

## 2017-09-30 NOTE — Assessment & Plan Note (Signed)
Well controlled, no changes to meds. Encouraged heart healthy diet such as the DASH diet and exercise as tolerated.  °

## 2017-09-30 NOTE — Patient Instructions (Signed)
Preventive Care 40-64 Years, Male Preventive care refers to lifestyle choices and visits with your health care provider that can promote health and wellness. What does preventive care include?  A yearly physical exam. This is also called an annual well check.  Dental exams once or twice a year.  Routine eye exams. Ask your health care provider how often you should have your eyes checked.  Personal lifestyle choices, including: ? Daily care of your teeth and gums. ? Regular physical activity. ? Eating a healthy diet. ? Avoiding tobacco and drug use. ? Limiting alcohol use. ? Practicing safe sex. ? Taking low-dose aspirin every day starting at age 50. What happens during an annual well check? The services and screenings done by your health care provider during your annual well check will depend on your age, overall health, lifestyle risk factors, and family history of disease. Counseling Your health care provider may ask you questions about your:  Alcohol use.  Tobacco use.  Drug use.  Emotional well-being.  Home and relationship well-being.  Sexual activity.  Eating habits.  Work and work Statistician.  Screening You may have the following tests or measurements:  Height, weight, and BMI.  Blood pressure.  Lipid and cholesterol levels. These may be checked every 5 years, or more frequently if you are over 50 years old.  Skin check.  Lung cancer screening. You may have this screening every year starting at age 50 if you have a 30-pack-year history of smoking and currently smoke or have quit within the past 15 years.  Fecal occult blood test (FOBT) of the stool. You may have this test every year starting at age 50.  Flexible sigmoidoscopy or colonoscopy. You may have a sigmoidoscopy every 5 years or a colonoscopy every 10 years starting at age 50.  Prostate cancer screening. Recommendations will vary depending on your family history and other risks.  Hepatitis C  blood test.  Hepatitis B blood test.  Sexually transmitted disease (STD) testing.  Diabetes screening. This is done by checking your blood sugar (glucose) after you have not eaten for a while (fasting). You may have this done every 1-3 years.  Discuss your test results, treatment options, and if necessary, the need for more tests with your health care provider. Vaccines Your health care provider may recommend certain vaccines, such as:  Influenza vaccine. This is recommended every year.  Tetanus, diphtheria, and acellular pertussis (Tdap, Td) vaccine. You may need a Td booster every 10 years.  Varicella vaccine. You may need this if you have not been vaccinated.  Zoster vaccine. You may need this after age 50.  Measles, mumps, and rubella (MMR) vaccine. You may need at least one dose of MMR if you were born in 1957 or later. You may also need a second dose.  Pneumococcal 13-valent conjugate (PCV13) vaccine. You may need this if you have certain conditions and have not been vaccinated.  Pneumococcal polysaccharide (PPSV23) vaccine. You may need one or two doses if you smoke cigarettes or if you have certain conditions.  Meningococcal vaccine. You may need this if you have certain conditions.  Hepatitis A vaccine. You may need this if you have certain conditions or if you travel or work in places where you may be exposed to hepatitis A.  Hepatitis B vaccine. You may need this if you have certain conditions or if you travel or work in places where you may be exposed to hepatitis B.  Haemophilus influenzae type b (Hib) vaccine.  You may need this if you have certain risk factors.  Talk to your health care provider about which screenings and vaccines you need and how often you need them. This information is not intended to replace advice given to you by your health care provider. Make sure you discuss any questions you have with your health care provider. Document Released: 03/28/2015  Document Revised: 11/19/2015 Document Reviewed: 12/31/2014 Elsevier Interactive Patient Education  [employer].

## 2017-09-30 NOTE — Assessment & Plan Note (Signed)
Tolerating statin, encouraged heart healthy diet, avoid trans fats, minimize simple carbs and saturated fats. Increase exercise as tolerated 

## 2017-09-30 NOTE — Progress Notes (Signed)
Patient ID: [name] [name]., male    DOB: [birth year]  Age: 52 y.o. MRN: [id number]    Subjective:  Subjective  HPI [name] [name]. presents for cpe and f/u cholesterol, bp,   No complaints except symbicort was too expensive.    Review of Systems  Constitutional: Negative.  Negative for chills and fever.  HENT: Negative for congestion, ear pain, hearing loss, nosebleeds, postnasal drip, rhinorrhea, sinus pressure, sneezing and tinnitus.   Eyes: Negative for photophobia, discharge, itching and visual disturbance.  Respiratory: Negative.  Negative for cough and shortness of breath.   Cardiovascular: Negative.  Negative for chest pain, palpitations and leg swelling.  Gastrointestinal: Negative for abdominal distention, abdominal pain, anal bleeding, blood in stool, constipation, diarrhea, nausea and vomiting.  Endocrine: Negative.   Genitourinary: Negative.  Negative for dysuria, frequency, hematuria and urgency.  Musculoskeletal: Negative.  Negative for back pain and myalgias.  Skin: Negative.  Negative for rash.  Allergic/Immunologic: Negative.  Negative for environmental allergies.  Neurological: Negative for dizziness, weakness, light-headedness, numbness and headaches.  Hematological: Does not bruise/bleed easily.  Psychiatric/Behavioral: Negative for agitation, confusion, decreased concentration, dysphoric mood, sleep disturbance and suicidal ideas. The patient is not nervous/anxious.     History Past Medical History:  Diagnosis Date  . Diabetes mellitus type II, uncontrolled ([place])   . HTN (hypertension)   . Hyperlipidemia LDL goal <70   . Schizophrenia [facility])     He has no past surgical history on file.   His family history includes Breast cancer in his maternal aunt and mother; Diabetes in his maternal uncle and mother; Hypertension in his father; Prostate cancer in his father.He reports that he has been smoking cigarettes and cigars.  He has a 60.00 pack-year  smoking history. He has never used smokeless tobacco. He reports that he has current or past drug history. Drug: Marijuana. He reports that he does not drink alcohol.  Current Outpatient Medications on File Prior to Visit  Medication Sig Dispense Refill  . ALLERGY RELIEF 10 MG tablet TAKE 1 TABLET BY MOUTH EVERY DAY 30 tablet 0  . aspirin EC 81 MG tablet Take 1 tablet (81 mg total) by mouth daily.    . metoprolol tartrate (LOPRESSOR) 25 MG tablet TAKE 1 TABLET BY MOUTH TWICE A DAY 180 tablet 1  . mirtazapine (REMERON) 30 MG tablet Take 1 tablet (30 mg total) by mouth at bedtime. 30 tablet 2  . risperiDONE (RISPERDAL) 2 MG tablet TAKE 1 TABLET IN THE MORNING AND TAKE 2 TABLETS AT BEDTIME 90 tablet 2  . simvastatin (ZOCOR) 40 MG tablet TAKE 1 TABLET BY MOUTH EVERY DAY 90 tablet 1  . traZODone (DESYREL) 100 MG tablet Take 1 tablet (100 mg total) by mouth at bedtime. 30 tablet 2  . Vitamin D, Ergocalciferol, (DRISDOL) 50000 units CAPS capsule Take 1 capsule once a week by mouth.  2   No current facility-administered medications on file prior to visit.      Objective:  Objective  Physical Exam  Constitutional: He is oriented to person, place, and time. He appears well-developed and well-nourished. No distress.  HENT:  Head: Normocephalic and atraumatic.  Right Ear: External ear normal.  Left Ear: External ear normal.  Nose: Nose normal.  Mouth/Throat: Oropharynx is clear and moist. No oropharyngeal exudate.  Eyes: Pupils are equal, round, and reactive to light. Conjunctivae and EOM are normal. Right eye exhibits no discharge. Left eye exhibits no discharge.  Neck: Normal range of motion. Neck supple.  No JVD present. No thyromegaly present.  Cardiovascular: Normal rate, regular rhythm and intact distal pulses.  No murmur heard. Pulmonary/Chest: Effort normal and breath sounds normal. No respiratory distress. He has no wheezes. He has no rales. He exhibits no tenderness.  Abdominal: Soft.  Bowel sounds are normal. He exhibits no distension and no mass. There is no tenderness. There is no rebound and no guarding.  Genitourinary: Rectum normal, prostate normal and penis normal. Rectal exam shows guaiac negative stool. No penile tenderness.  Musculoskeletal: Normal range of motion. He exhibits no edema or tenderness.  Lymphadenopathy:    He has no cervical adenopathy.  Neurological: He is alert and oriented to person, place, and time. He has normal reflexes. He displays normal reflexes. No cranial nerve deficit. He exhibits normal muscle tone.  Skin: Skin is warm and dry. No rash noted. He is not diaphoretic. No erythema.  Psychiatric: He has a normal mood and affect. His behavior is normal. Judgment and thought content normal.  Nursing note and vitals reviewed. rectal-- heme neg brown stool,  Normal prostate BP 113/73 (BP Location: Left Arm, Patient Position: Sitting, Cuff Size: Large)   Pulse 97   Temp 98.1 F (36.7 C) (Oral)   Resp 18   Ht 5' 9.5" (1.765 m)   Wt 250 lb 12.8 oz (113.8 kg)   SpO2 97%   BMI 36.51 kg/m  Wt Readings from Last 3 Encounters:  09/30/17 250 lb 12.8 oz (113.8 kg)  01/24/17 246 lb 9.6 oz (111.9 kg)  07/13/16 233 lb (105.7 kg)     Lab Results  Component Value Date   WBC 9.3 07/13/2016   HGB 14.6 07/13/2016   HCT 43.7 07/13/2016   PLT 496.0 (H) 07/13/2016   GLUCOSE 56 (L) 01/24/2017   CHOL 143 01/24/2017   TRIG 166.0 (H) 01/24/2017   HDL 39.50 01/24/2017   LDLDIRECT 133.0 05/27/2016   LDLCALC 70 01/24/2017   ALT 19 01/24/2017   AST 18 01/24/2017   NA 136 01/24/2017   K 4.4 01/24/2017   CL 103 01/24/2017   CREATININE 1.85 (H) 01/24/2017   BUN 14 01/24/2017   CO2 26 01/24/2017   TSH 1.38 07/13/2016   PSA 0.63 03/22/2013   HGBA1C 7.0 (H) 01/24/2017   MICROALBUR 5.1 (H) 01/24/2017    Dg Chest 2 View  Result Date: 01/25/2017 CLINICAL DATA:  Several days of cough without other symptoms ; current smoker. EXAM: CHEST  2 VIEW  COMPARISON:  Chest x-ray of April 05, 2016 FINDINGS: The lungs are well-expanded. The interstitial markings are coarse though stable. There is no alveolar infiltrate. The heart and mediastinal structures are normal. There is no pleural effusion. Old rib deformities on the right are observed. Otherwise the bony thorax is unremarkable. IMPRESSION: Mild chronic bronchitic-smoking related changes, stable. No pneumonia, CHF, nor other acute cardiopulmonary abnormality. Electronically Signed   By: David  Martinique M.D.   On: 01/25/2017 08:03     Assessment & Plan:  Plan  I have discontinued [name]'s budesonide-formoterol and divalproex. I am also having him start on Fluticasone-Salmeterol. Additionally, I am having him maintain his aspirin EC, ALLERGY RELIEF, Vitamin D (Ergocalciferol), traZODone, risperiDONE, mirtazapine, simvastatin, and metoprolol tartrate.  Meds ordered this encounter  Medications  . Fluticasone-Salmeterol (ADVAIR DISKUS) 250-50 MCG/DOSE AEPB    Sig: Inhale 1 puff into the lungs 2 (two) times daily.    Dispense:  1 each    Refill:  3    Problem List Items Addressed  This Visit      Unprioritized   DM (diabetes mellitus) type II uncontrolled, periph vascular disorder ([place])    hgba1c to be checked, minimize simple carbs. Increase exercise as tolerated. Continue current meds       HTN (hypertension)    Well controlled, no changes to meds. Encouraged heart healthy diet such as the DASH diet and exercise as tolerated.       Relevant Orders   CBC with Differential/Platelet   Hemoglobin A1c   Lipid panel   Microalbumin / creatinine urine ratio   TSH   PSA   Comprehensive metabolic panel   Hyperlipidemia    Tolerating statin, encouraged heart healthy diet, avoid trans fats, minimize simple carbs and saturated fats. Increase exercise as tolerated      Relevant Orders   CBC with Differential/Platelet   Hemoglobin A1c   Lipid panel   Microalbumin /  creatinine urine ratio   TSH   PSA   Comprehensive metabolic panel   Renal insufficiency    Per nephrology       Other Visit Diagnoses    Preventative health care    -  Primary   Chronic bronchitis, unspecified chronic bronchitis type ([place])       Relevant Medications   Fluticasone-Salmeterol (ADVAIR DISKUS) 250-50 MCG/DOSE AEPB   Colon cancer screening       Relevant Orders   POC Hemoccult Bld/Stl (1-Cd Office Dx) (Completed)   Hyperglycemia       Relevant Orders   CBC with Differential/Platelet   Hemoglobin A1c   Lipid panel   Microalbumin / creatinine urine ratio   TSH   PSA   Comprehensive metabolic panel      Follow-up: Return in about 6 months (around 04/02/2018), or if symptoms worsen or fail to improve.  Ann Held, DO

## 2017-09-30 NOTE — Assessment & Plan Note (Signed)
hgba1c to be checked, minimize simple carbs. Increase exercise as tolerated. Continue current meds  

## 2017-10-01 LAB — CBC WITH DIFFERENTIAL/PLATELET
BASOS PCT: 0.5 %
Basophils Absolute: 55 cells/uL (ref 0–200)
EOS ABS: 218 {cells}/uL (ref 15–500)
Eosinophils Relative: 2 %
HEMATOCRIT: 45.1 % (ref 38.5–50.0)
Hemoglobin: 15.7 g/dL (ref 13.2–17.1)
Lymphs Abs: 1799 cells/uL (ref 850–3900)
MCH: 30 pg (ref 27.0–33.0)
MCHC: 34.8 g/dL (ref 32.0–36.0)
MCV: 86.2 fL (ref 80.0–100.0)
MPV: 9.5 fL (ref 7.5–12.5)
Monocytes Relative: 7.1 %
NEUTROS PCT: 73.9 %
Neutro Abs: 8055 cells/uL — ABNORMAL HIGH (ref 1500–7800)
PLATELETS: 367 10*3/uL (ref 140–400)
RBC: 5.23 10*6/uL (ref 4.20–5.80)
RDW: 12.9 % (ref 11.0–15.0)
Total Lymphocyte: 16.5 %
WBC: 10.9 10*3/uL — ABNORMAL HIGH (ref 3.8–10.8)
WBCMIX: 774 {cells}/uL (ref 200–950)

## 2017-10-01 LAB — MICROALBUMIN / CREATININE URINE RATIO
Creatinine, Urine: 77 mg/dL (ref 20–320)
Microalb Creat Ratio: 452 mcg/mg creat — ABNORMAL HIGH (ref <30)
Microalb, Ur: 34.8 mg/dL

## 2017-10-01 LAB — COMPREHENSIVE METABOLIC PANEL
AG Ratio: 1.6 (calc) (ref 1.0–2.5)
ALT: 239 U/L — ABNORMAL HIGH (ref 9–46)
AST: 168 U/L — ABNORMAL HIGH (ref 10–40)
Albumin: 4.2 g/dL (ref 3.6–5.1)
Alkaline phosphatase (APISO): 88 U/L (ref 40–115)
BUN/Creatinine Ratio: 7 (calc) (ref 6–22)
BUN: 12 mg/dL (ref 7–25)
CO2: 23 mmol/L (ref 20–32)
Calcium: 9.7 mg/dL (ref 8.6–10.3)
Chloride: 100 mmol/L (ref 98–110)
Creat: 1.61 mg/dL — ABNORMAL HIGH (ref 0.60–1.35)
Globulin: 2.7 g/dL (calc) (ref 1.9–3.7)
Glucose, Bld: 121 mg/dL — ABNORMAL HIGH (ref 65–99)
Potassium: 4.4 mmol/L (ref 3.5–5.3)
Sodium: 137 mmol/L (ref 135–146)
Total Bilirubin: 0.4 mg/dL (ref 0.2–1.2)
Total Protein: 6.9 g/dL (ref 6.1–8.1)

## 2017-10-01 LAB — TSH: TSH: 1.58 m[IU]/L (ref 0.40–4.50)

## 2017-10-01 LAB — HEMOGLOBIN A1C
Hgb A1c MFr Bld: 6.9 % of total Hgb — ABNORMAL HIGH (ref <5.7)
Mean Plasma Glucose: 151 (calc)
eAG (mmol/L): 8.4 (calc)

## 2017-10-01 LAB — LIPID PANEL
Cholesterol: 151 mg/dL (ref <200)
HDL: 43 mg/dL (ref >40)
LDL CHOLESTEROL (CALC): 66 mg/dL
NON-HDL CHOLESTEROL (CALC): 108 mg/dL (ref <130)
Total CHOL/HDL Ratio: 3.5 (calc) (ref <5.0)
Triglycerides: 345 mg/dL — ABNORMAL HIGH (ref <150)

## 2017-10-01 LAB — PSA: PSA: 0.5 ng/mL (ref <=4.0)

## 2017-10-05 NOTE — Addendum Note (Signed)
Addended byDamita Dunnings D on: 10/05/2017 08:21 AM   Modules accepted: Orders

## 2017-10-25 ENCOUNTER — Other Ambulatory Visit (HOSPITAL_COMMUNITY): Payer: Self-pay | Admitting: Psychiatry

## 2017-10-25 DIAGNOSIS — F209 Schizophrenia, unspecified: Secondary | ICD-10-CM

## 2017-11-05 ENCOUNTER — Encounter (HOSPITAL_COMMUNITY): Payer: Self-pay | Admitting: Emergency Medicine

## 2017-11-05 ENCOUNTER — Observation Stay (HOSPITAL_COMMUNITY): Payer: Medicare HMO

## 2017-11-05 ENCOUNTER — Inpatient Hospital Stay (HOSPITAL_COMMUNITY)
Admission: EM | Admit: 2017-11-05 | Discharge: 2017-11-08 | DRG: 638 | Disposition: A | Discharge status: Home Health | Payer: Medicare HMO | Attending: Internal Medicine | Admitting: Internal Medicine

## 2017-11-05 ENCOUNTER — Other Ambulatory Visit: Payer: Self-pay

## 2017-11-05 ENCOUNTER — Ambulatory Visit (HOSPITAL_COMMUNITY)
Admission: EM | Admit: 2017-11-05 | Discharge: 2017-11-05 | Disposition: A | Discharge status: Home / Self Care | Payer: Medicare HMO | Source: Home / Self Care

## 2017-11-05 DIAGNOSIS — G4730 Sleep apnea, unspecified: Secondary | ICD-10-CM

## 2017-11-05 DIAGNOSIS — Z720 Tobacco use: Secondary | ICD-10-CM | POA: Diagnosis present

## 2017-11-05 DIAGNOSIS — R05 Cough: Secondary | ICD-10-CM | POA: Diagnosis present

## 2017-11-05 DIAGNOSIS — D72829 Elevated white blood cell count, unspecified: Secondary | ICD-10-CM | POA: Diagnosis not present

## 2017-11-05 DIAGNOSIS — F250 Schizoaffective disorder, bipolar type: Secondary | ICD-10-CM | POA: Diagnosis present

## 2017-11-05 DIAGNOSIS — F17210 Nicotine dependence, cigarettes, uncomplicated: Secondary | ICD-10-CM | POA: Diagnosis present

## 2017-11-05 DIAGNOSIS — R739 Hyperglycemia, unspecified: Secondary | ICD-10-CM

## 2017-11-05 DIAGNOSIS — Z7951 Long term (current) use of inhaled steroids: Secondary | ICD-10-CM

## 2017-11-05 DIAGNOSIS — Z6831 Body mass index (BMI) 31.0-31.9, adult: Secondary | ICD-10-CM

## 2017-11-05 DIAGNOSIS — Z803 Family history of malignant neoplasm of breast: Secondary | ICD-10-CM

## 2017-11-05 DIAGNOSIS — R059 Cough, unspecified: Secondary | ICD-10-CM | POA: Diagnosis present

## 2017-11-05 DIAGNOSIS — E1010 Type 1 diabetes mellitus with ketoacidosis without coma: Secondary | ICD-10-CM | POA: Diagnosis not present

## 2017-11-05 DIAGNOSIS — E1110 Type 2 diabetes mellitus with ketoacidosis without coma: Secondary | ICD-10-CM | POA: Diagnosis not present

## 2017-11-05 DIAGNOSIS — F79 Unspecified intellectual disabilities: Secondary | ICD-10-CM | POA: Diagnosis present

## 2017-11-05 DIAGNOSIS — E1122 Type 2 diabetes mellitus with diabetic chronic kidney disease: Secondary | ICD-10-CM | POA: Diagnosis present

## 2017-11-05 DIAGNOSIS — E785 Hyperlipidemia, unspecified: Secondary | ICD-10-CM | POA: Diagnosis present

## 2017-11-05 DIAGNOSIS — Z716 Tobacco abuse counseling: Secondary | ICD-10-CM

## 2017-11-05 DIAGNOSIS — Z79899 Other long term (current) drug therapy: Secondary | ICD-10-CM

## 2017-11-05 DIAGNOSIS — R0902 Hypoxemia: Secondary | ICD-10-CM | POA: Diagnosis present

## 2017-11-05 DIAGNOSIS — Z833 Family history of diabetes mellitus: Secondary | ICD-10-CM

## 2017-11-05 DIAGNOSIS — J302 Other seasonal allergic rhinitis: Secondary | ICD-10-CM | POA: Diagnosis present

## 2017-11-05 DIAGNOSIS — N179 Acute kidney failure, unspecified: Secondary | ICD-10-CM | POA: Diagnosis present

## 2017-11-05 DIAGNOSIS — Z8249 Family history of ischemic heart disease and other diseases of the circulatory system: Secondary | ICD-10-CM

## 2017-11-05 DIAGNOSIS — E1129 Type 2 diabetes mellitus with other diabetic kidney complication: Secondary | ICD-10-CM | POA: Diagnosis present

## 2017-11-05 DIAGNOSIS — F329 Major depressive disorder, single episode, unspecified: Secondary | ICD-10-CM | POA: Diagnosis present

## 2017-11-05 DIAGNOSIS — I10 Essential (primary) hypertension: Secondary | ICD-10-CM

## 2017-11-05 DIAGNOSIS — J449 Chronic obstructive pulmonary disease, unspecified: Secondary | ICD-10-CM | POA: Diagnosis present

## 2017-11-05 DIAGNOSIS — Z7982 Long term (current) use of aspirin: Secondary | ICD-10-CM

## 2017-11-05 DIAGNOSIS — N183 Chronic kidney disease, stage 3 unspecified: Secondary | ICD-10-CM

## 2017-11-05 DIAGNOSIS — I129 Hypertensive chronic kidney disease with stage 1 through stage 4 chronic kidney disease, or unspecified chronic kidney disease: Secondary | ICD-10-CM | POA: Diagnosis present

## 2017-11-05 DIAGNOSIS — E669 Obesity, unspecified: Secondary | ICD-10-CM | POA: Diagnosis present

## 2017-11-05 DIAGNOSIS — F32A Depression, unspecified: Secondary | ICD-10-CM | POA: Diagnosis present

## 2017-11-05 DIAGNOSIS — Z8042 Family history of malignant neoplasm of prostate: Secondary | ICD-10-CM

## 2017-11-05 LAB — BASIC METABOLIC PANEL
ANION GAP: 18 — AB (ref 5–15)
Anion gap: 19 — ABNORMAL HIGH (ref 5–15)
BUN: 25 mg/dL — AB (ref 6–20)
BUN: 23 mg/dL — AB (ref 6–20)
CHLORIDE: 81 mmol/L — AB (ref 98–111)
CHLORIDE: 91 mmol/L — AB (ref 98–111)
CO2: 17 mmol/L — AB (ref 22–32)
CO2: 19 mmol/L — ABNORMAL LOW (ref 22–32)
Calcium: 9.3 mg/dL (ref 8.9–10.3)
Calcium: 9.1 mg/dL (ref 8.9–10.3)
Creatinine, Ser: 2.37 mg/dL — ABNORMAL HIGH (ref 0.61–1.24)
Creatinine, Ser: 2.18 mg/dL — ABNORMAL HIGH (ref 0.61–1.24)
GFR calc Af Amer: 35 mL/min — ABNORMAL LOW (ref >60)
GFR calc non Af Amer: 31 mL/min — ABNORMAL LOW (ref >60)
GFR calc non Af Amer: 34 mL/min — ABNORMAL LOW (ref >60)
GFR, EST AFRICAN AMERICAN: 39 mL/min — AB (ref >60)
GLUCOSE: 1026 mg/dL — AB (ref 70–99)
Glucose, Bld: 704 mg/dL (ref 70–99)
POTASSIUM: 4.5 mmol/L (ref 3.5–5.1)
POTASSIUM: 4 mmol/L (ref 3.5–5.1)
SODIUM: 128 mmol/L — AB (ref 135–145)
Sodium: 117 mmol/L — CL (ref 135–145)

## 2017-11-05 LAB — URINALYSIS, ROUTINE W REFLEX MICROSCOPIC
BACTERIA UA: NONE SEEN
Bilirubin Urine: NEGATIVE
Ketones, ur: 20 mg/dL — AB
LEUKOCYTES UA: NEGATIVE
Nitrite: NEGATIVE
PH: 6 (ref 5.0–8.0)
Protein, ur: 30 mg/dL — AB
Specific Gravity, Urine: 1.016 (ref 1.005–1.030)

## 2017-11-05 LAB — CBC
HEMATOCRIT: 45.8 % (ref 39.0–52.0)
Hemoglobin: 15.3 g/dL (ref 13.0–17.0)
MCH: 29.8 pg (ref 26.0–34.0)
MCHC: 33.4 g/dL (ref 30.0–36.0)
MCV: 89.3 fL (ref 78.0–100.0)
Platelets: 322 10*3/uL (ref 150–400)
RBC: 5.13 MIL/uL (ref 4.22–5.81)
RDW: 12 % (ref 11.5–15.5)
WBC: 12.1 10*3/uL — ABNORMAL HIGH (ref 4.0–10.5)

## 2017-11-05 LAB — CBG MONITORING, ED: Glucose-Capillary: >600 mg/dL (ref 70–99)

## 2017-11-05 LAB — GLUCOSE, CAPILLARY: GLUCOSE-CAPILLARY: 514 mg/dL — AB (ref 70–99)

## 2017-11-05 MED ORDER — ENOXAPARIN SODIUM 40 MG/0.4ML ~~LOC~~ SOLN
40.0000 mg | Freq: Every day | SUBCUTANEOUS | Status: DC
  Administered 2017-11-05 – 2017-11-07 (×3): 40 mg via SUBCUTANEOUS
  Filled 2017-11-05 (×3): qty 0.4

## 2017-11-05 MED ORDER — ZOLPIDEM TARTRATE 5 MG PO TABS: 5.0000 mg | ORAL_TABLET | Freq: Every evening | ORAL | Status: DC | PRN

## 2017-11-05 MED ORDER — INSULIN ASPART 100 UNIT/ML ~~LOC~~ SOLN: 10.0000 [IU] | Freq: Once | SUBCUTANEOUS | Status: DC

## 2017-11-05 MED ORDER — RISPERIDONE 2 MG PO TABS
2.0000 mg | ORAL_TABLET | Freq: Every day | ORAL | Status: DC
  Administered 2017-11-06 – 2017-11-08 (×3): 2 mg via ORAL
  Filled 2017-11-05 (×4): qty 1

## 2017-11-05 MED ORDER — INSULIN ASPART 100 UNIT/ML ~~LOC~~ SOLN
10.0000 [IU] | Freq: Once | SUBCUTANEOUS | Status: DC
  Filled 2017-11-05: qty 1

## 2017-11-05 MED ORDER — POTASSIUM CHLORIDE 10 MEQ/100ML IV SOLN
10.0000 meq | INTRAVENOUS | Status: AC
  Administered 2017-11-05 (×2): 10 meq via INTRAVENOUS
  Filled 2017-11-05 (×2): qty 100

## 2017-11-05 MED ORDER — SODIUM CHLORIDE 0.9 % IV SOLN
INTRAVENOUS | Status: DC
  Administered 2017-11-06: 03:00:00 via INTRAVENOUS

## 2017-11-05 MED ORDER — MIRTAZAPINE 30 MG PO TABS
30.0000 mg | ORAL_TABLET | Freq: Every day | ORAL | Status: DC
  Administered 2017-11-05 – 2017-11-07 (×3): 30 mg via ORAL
  Filled 2017-11-05 (×3): qty 1

## 2017-11-05 MED ORDER — LORATADINE 10 MG PO TABS
10.0000 mg | ORAL_TABLET | Freq: Every day | ORAL | Status: DC
  Administered 2017-11-06 – 2017-11-08 (×3): 10 mg via ORAL
  Filled 2017-11-05 (×3): qty 1

## 2017-11-05 MED ORDER — LACTATED RINGERS IV BOLUS
1000.0000 mL | Freq: Once | INTRAVENOUS | Status: AC
  Administered 2017-11-05: 1000 mL via INTRAVENOUS

## 2017-11-05 MED ORDER — MOMETASONE FURO-FORMOTEROL FUM 200-5 MCG/ACT IN AERO
2.0000 | INHALATION_SPRAY | Freq: Two times a day (BID) | RESPIRATORY_TRACT | Status: DC
  Administered 2017-11-06 – 2017-11-08 (×5): 2 via RESPIRATORY_TRACT
  Filled 2017-11-05: qty 8.8

## 2017-11-05 MED ORDER — DEXTROSE-NACL 5-0.45 % IV SOLN
INTRAVENOUS | Status: DC
  Administered 2017-11-06 (×2): via INTRAVENOUS

## 2017-11-05 MED ORDER — SODIUM CHLORIDE 0.9 % IV BOLUS: 1000.0000 mL | Freq: Once | INTRAVENOUS | Status: DC

## 2017-11-05 MED ORDER — NICOTINE 21 MG/24HR TD PT24
21.0000 mg | MEDICATED_PATCH | Freq: Every day | TRANSDERMAL | Status: DC
  Administered 2017-11-05 – 2017-11-08 (×4): 21 mg via TRANSDERMAL
  Filled 2017-11-05 (×4): qty 1

## 2017-11-05 MED ORDER — METOPROLOL TARTRATE 25 MG PO TABS
25.0000 mg | ORAL_TABLET | Freq: Two times a day (BID) | ORAL | Status: DC
  Administered 2017-11-05 – 2017-11-08 (×6): 25 mg via ORAL
  Filled 2017-11-05 (×6): qty 1

## 2017-11-05 MED ORDER — HYDRALAZINE HCL 20 MG/ML IJ SOLN: 5.0000 mg | INTRAMUSCULAR | Status: DC | PRN

## 2017-11-05 MED ORDER — ASPIRIN EC 81 MG PO TBEC
81.0000 mg | DELAYED_RELEASE_TABLET | Freq: Every day | ORAL | Status: DC
  Administered 2017-11-06 – 2017-11-08 (×3): 81 mg via ORAL
  Filled 2017-11-05 (×3): qty 1

## 2017-11-05 MED ORDER — SODIUM CHLORIDE 0.9 % IV SOLN
INTRAVENOUS | Status: DC
  Administered 2017-11-05: 4.7 [IU]/h via INTRAVENOUS
  Filled 2017-11-05 (×2): qty 1

## 2017-11-05 MED ORDER — DM-GUAIFENESIN ER 30-600 MG PO TB12
1.0000 | ORAL_TABLET | Freq: Two times a day (BID) | ORAL | Status: DC | PRN
  Administered 2017-11-06 – 2017-11-08 (×2): 1 via ORAL
  Filled 2017-11-05 (×2): qty 1

## 2017-11-05 MED ORDER — ONDANSETRON HCL 4 MG/2ML IJ SOLN: 4.0000 mg | Freq: Three times a day (TID) | INTRAMUSCULAR | Status: DC | PRN

## 2017-11-05 MED ORDER — LEVALBUTEROL HCL 1.25 MG/0.5ML IN NEBU
1.2500 mg | INHALATION_SOLUTION | Freq: Four times a day (QID) | RESPIRATORY_TRACT | Status: DC
  Administered 2017-11-05 – 2017-11-06 (×5): 1.25 mg via RESPIRATORY_TRACT
  Filled 2017-11-05 (×6): qty 0.5

## 2017-11-05 MED ORDER — RISPERIDONE 2 MG PO TABS: 2.0000 mg | ORAL_TABLET | ORAL | Status: DC

## 2017-11-05 MED ORDER — ACETAMINOPHEN 325 MG PO TABS: 650.0000 mg | ORAL_TABLET | Freq: Four times a day (QID) | ORAL | Status: DC | PRN

## 2017-11-05 MED ORDER — RISPERIDONE 2 MG PO TABS
4.0000 mg | ORAL_TABLET | Freq: Every day | ORAL | Status: DC
  Administered 2017-11-05 – 2017-11-07 (×3): 4 mg via ORAL
  Filled 2017-11-05 (×3): qty 2

## 2017-11-05 MED ORDER — TRAZODONE HCL 50 MG PO TABS
100.0000 mg | ORAL_TABLET | Freq: Every day | ORAL | Status: DC
  Administered 2017-11-05 – 2017-11-07 (×3): 100 mg via ORAL
  Filled 2017-11-05 (×3): qty 2

## 2017-11-05 MED ORDER — SODIUM CHLORIDE 0.9 % IV BOLUS
2000.0000 mL | Freq: Once | INTRAVENOUS | Status: AC
  Administered 2017-11-05: 2000 mL via INTRAVENOUS

## 2017-11-05 MED ORDER — SIMVASTATIN 40 MG PO TABS
40.0000 mg | ORAL_TABLET | Freq: Every day | ORAL | Status: DC
  Administered 2017-11-06 – 2017-11-08 (×3): 40 mg via ORAL
  Filled 2017-11-05 (×3): qty 1

## 2017-11-05 NOTE — ED Triage Notes (Signed)
Pt states that he took his blood sugar at home and it read "high" states that is not taking any diabetes medication as he is not prescribed it.

## 2017-11-05 NOTE — ED Triage Notes (Signed)
Reports CBG >600 at home.  Was told by PCP to come to [facility].  Pt states "I feel fine". Discussed with J. Omohundro.  Pt & mother instructed to go to ED.  Pt assisted to ED via wheelchair with staff.

## 2017-11-05 NOTE — ED Provider Notes (Signed)
[facility] EMERGENCY DEPARTMENT Provider Note   CSN: [id number] Arrival date & time: 11/05/17  1748     History   Chief Complaint Chief Complaint  Patient presents with  . Hyperglycemia    HPI Kariem Wolfson. is a 52 y.o. male.  HPI 52 year old male with history of schizophrenia, hypertension, hyperlipidemia, "prediabetes" not on any diabetes medications presents to the emergency department today for evaluation of hyperglycemia.  He is accompanied by his mother who states that she noticed he has been drinking more fluids lately including numerous sodas, chocolate milk, and other flavored beverages.  This prompted her to check his blood sugar which read greater than 600 on her home glucometer so he came to the emergency department for evaluation.  Patient has had no pain.  He has had a cough and viral URI symptoms over the past couple of days but no other infectious signs or symptoms.  No fevers.  No new medications.  Reports that he has had poly-dips here and frequent waking at night over the past couple weeks to urinate.  Has no other acute concerns at this time including no HA, vision changes, or confusion. Acting at baseline per mother. States he has never taken any meds including insulin for his diabetes in past.   Past Medical History:  Diagnosis Date  . Diabetes mellitus type II, uncontrolled ([place])   . HTN (hypertension)   . Hyperlipidemia LDL goal <70   . Schizophrenia [facility])     Patient Active Problem List   Diagnosis Date Noted  . DKA (diabetic ketoacidoses) ([place]) 11/05/2017  . Acute renal failure superimposed on stage 3 chronic kidney disease ([place]) 11/05/2017  . Type II diabetes mellitus with renal manifestations ([place]) 11/05/2017  . Depression 11/05/2017  . Leukocytosis 11/05/2017  . Cough 11/05/2017  . Tobacco abuse 11/05/2017  . Schizoaffective disorder, bipolar type ([place]) 06/10/2016  . DM (diabetes mellitus) type II uncontrolled, periph  vascular disorder ([place]) 02/29/2016  . Schizophrenia, disorganized type ([place]) 06/02/2015  . Hyperlipidemia 09/23/2014  . Renal insufficiency 09/23/2014  . HTN (hypertension) 03/22/2013  . Seasonal allergies 03/22/2013    History reviewed. No pertinent surgical history.      Home Medications    Prior to Admission medications   Medication Sig Start Date End Date Taking? Authorizing Provider  ALLERGY RELIEF 10 MG tablet TAKE 1 TABLET BY MOUTH EVERY DAY Patient taking differently: Take 10 mg by mouth daily.  08/23/16  Yes Ann Held, DO  aspirin EC 81 MG tablet Take 1 tablet (81 mg total) by mouth daily. 01/21/16  Yes Debbrah Alar, NP  Fluticasone-Salmeterol (ADVAIR DISKUS) 250-50 MCG/DOSE AEPB Inhale 1 puff into the lungs 2 (two) times daily. 09/30/17  Yes Roma Schanz R, DO  metoprolol tartrate (LOPRESSOR) 25 MG tablet TAKE 1 TABLET BY MOUTH TWICE A DAY Patient taking differently: Take 25 mg by mouth 2 (two) times daily.  09/16/17  Yes Roma Schanz R, DO  mirtazapine (REMERON) 30 MG tablet Take 1 tablet (30 mg total) by mouth at bedtime. 08/09/17  Yes Arfeen, Arlyce Harman, MD  risperiDONE (RISPERDAL) 2 MG tablet TAKE 1 TABLET IN THE MORNING AND TAKE 2 TABLETS AT BEDTIME Patient taking differently: Take 2-4 mg by mouth See admin instructions. TAKE 1 TABLET IN THE MORNING AND TAKE 2 TABLETS AT BEDTIME 08/09/17  Yes Arfeen, Arlyce Harman, MD  simvastatin (ZOCOR) 40 MG tablet TAKE 1 TABLET BY MOUTH EVERY DAY Patient taking differently: Take 40  mg by mouth daily.  08/29/17  Yes Roma Schanz R, DO  traZODone (DESYREL) 100 MG tablet Take 1 tablet (100 mg total) by mouth at bedtime. 08/09/17  Yes Arfeen, Arlyce Harman, MD  Vitamin D, Ergocalciferol, (DRISDOL) 50000 units CAPS capsule Take 1 capsule once a week by mouth. 11/22/16  Yes [provider]    Family History Family History  Problem Relation Age of Onset  . Breast cancer Mother   . Diabetes Mother   . Prostate  cancer Father        StepFather  . Hypertension Father   . Breast cancer Maternal Aunt   . Diabetes Maternal Uncle     Social History Social History   Tobacco Use  . Smoking status: Current Every Day Smoker    Packs/day: 2.00    Years: 30.00    Pack years: 60.00    Types: Cigarettes, Cigars  . Smokeless tobacco: Never Used  Substance Use Topics  . Alcohol use: No    Comment: no longer  . Drug use: Yes    Types: Marijuana    Comment: not currently     Allergies   Patient has no known allergies.   Review of Systems Review of Systems  Constitutional: Negative for chills and fever.  HENT: Positive for congestion.   Respiratory: Positive for cough. Negative for shortness of breath.   Cardiovascular: Negative for chest pain and leg swelling.  Gastrointestinal: Negative for abdominal pain, diarrhea, nausea and vomiting.  Endocrine: Positive for polydipsia and polyuria.  Genitourinary: Negative for dysuria and hematuria.  Musculoskeletal: Negative for back pain and gait problem.  Skin: Negative for rash.  Neurological: Negative for weakness, numbness and headaches.  Psychiatric/Behavioral: Negative for confusion.  All other systems reviewed and are negative.    Physical Exam Updated Vital Signs BP (!) 149/105   Pulse 97   Temp 98.5 F (36.9 C) (Oral)   Resp 14   Ht 5\' 9"  (1.753 m)   Wt 97.5 kg   SpO2 97%   BMI 31.75 kg/m   Physical Exam  Constitutional: No distress.  HENT:  Head: Normocephalic and atraumatic.  Eyes: Conjunctivae are normal. Right eye exhibits no discharge. Left eye exhibits no discharge.  Neck: Normal range of motion. No tracheal deviation present.  Cardiovascular: Regular rhythm, normal heart sounds and intact distal pulses. Exam reveals no friction rub.  No murmur heard. Pulmonary/Chest: Effort normal and breath sounds normal. No respiratory distress.  Abdominal: Soft. Bowel sounds are normal. He exhibits no distension. There is no  tenderness.  Musculoskeletal: He exhibits no edema or deformity.  Neurological: He is alert. He exhibits normal muscle tone.  Skin: Capillary refill takes 2 to 3 seconds. No rash noted. He is not diaphoretic.  Psychiatric: He has a normal mood and affect.  Nursing note and vitals reviewed.    ED Treatments / Results  Labs (all labs ordered are listed, but only abnormal results are displayed) Labs Reviewed  BASIC METABOLIC PANEL - Abnormal; Notable for the following components:      Result Value   Sodium 117 (*)    Chloride 81 (*)    CO2 17 (*)    Glucose, Bld 1,026 (*)    BUN 25 (*)    Creatinine, Ser 2.37 (*)    GFR calc non Af Amer 31 (*)    GFR calc Af Amer 35 (*)    Anion gap 19 (*)    All other components within normal limits  CBC - Abnormal; Notable for the following components:   WBC 12.1 (*)    All other components within normal limits  URINALYSIS, ROUTINE W REFLEX MICROSCOPIC - Abnormal; Notable for the following components:   Color, Urine STRAW (*)    Glucose, UA >=500 (*)    Hgb urine dipstick MODERATE (*)    Ketones, ur 20 (*)    Protein, ur 30 (*)    All other components within normal limits  BASIC METABOLIC PANEL - Abnormal; Notable for the following components:   Sodium 128 (*)    Chloride 91 (*)    CO2 19 (*)    Glucose, Bld 704 (*)    BUN 23 (*)    Creatinine, Ser 2.18 (*)    GFR calc non Af Amer 34 (*)    GFR calc Af Amer 39 (*)    Anion gap 18 (*)    All other components within normal limits  CBG MONITORING, ED - Abnormal; Notable for the following components:   Glucose-Capillary >600 (*)    All other components within normal limits  CULTURE, BLOOD (ROUTINE X 2)  CULTURE, BLOOD (ROUTINE X 2)  HIV ANTIBODY (ROUTINE TESTING)  BASIC METABOLIC PANEL  BASIC METABOLIC PANEL  BASIC METABOLIC PANEL  HEMOGLOBIN A1C  CBG MONITORING, ED    EKG EKG Interpretation  Date/Time:  Saturday November 05 2017 18:59:48 EDT Ventricular Rate:  109 PR  Interval:    QRS Duration: 71 QT Interval:  309 QTC Calculation: 416 R Axis:   17 Text Interpretation:  Sinus tachycardia Low voltage, extremity and precordial leads Confirmed by Lajean Saver [id number]) on 11/05/2017 7:58:51 PM   Radiology Dg Chest 2 View  Result Date: 11/05/2017 CLINICAL DATA:  Cough. EXAM: CHEST - 2 VIEW COMPARISON:  01/24/2017 FINDINGS: The cardiomediastinal contours are normal. Minimal chronic bronchitic change, stable allowing for differences in technique. Pulmonary vasculature is normal. No consolidation, pleural effusion, or pneumothorax. Remote right rib fractures. No acute osseous abnormalities are seen. IMPRESSION: Stable chronic bronchitic change without acute abnormality. Electronically Signed   By: Jeb Levering M.D.   On: 11/05/2017 22:00    Procedures Procedures (including critical care time)  Medications Ordered in ED Medications  aspirin EC tablet 81 mg (has no administration in time range)  metoprolol tartrate (LOPRESSOR) tablet 25 mg (has no administration in time range)  simvastatin (ZOCOR) tablet 40 mg (has no administration in time range)  mirtazapine (REMERON) tablet 30 mg (has no administration in time range)  traZODone (DESYREL) tablet 100 mg (has no administration in time range)  loratadine (CLARITIN) tablet 10 mg (has no administration in time range)  mometasone-formoterol (DULERA) 200-5 MCG/ACT inhaler 2 puff (has no administration in time range)  0.9 %  sodium chloride infusion (has no administration in time range)  dextrose 5 %-0.45 % sodium chloride infusion (has no administration in time range)  insulin regular (NOVOLIN R,HUMULIN R) 100 Units in sodium chloride 0.9 % 100 mL (1 Units/mL) infusion (4.7 Units/hr Intravenous New Bag/Given 11/05/17 2223)  enoxaparin (LOVENOX) injection 40 mg (has no administration in time range)  potassium chloride 10 mEq in 100 mL IVPB (10 mEq Intravenous New Bag/Given 11/05/17 2136)  hydrALAZINE (APRESOLINE)  injection 5 mg (has no administration in time range)  ondansetron (ZOFRAN) injection 4 mg (has no administration in time range)  acetaminophen (TYLENOL) tablet 650 mg (has no administration in time range)  zolpidem (AMBIEN) tablet 5 mg (has no administration in time range)  dextromethorphan-guaiFENesin (MUCINEX DM) 30-600  MG per 12 hr tablet 1 tablet (has no administration in time range)  levalbuterol (XOPENEX) nebulizer solution 1.25 mg (has no administration in time range)  nicotine (NICODERM CQ - dosed in mg/24 hours) patch 21 mg (has no administration in time range)  risperiDONE (RISPERDAL) tablet 2 mg (has no administration in time range)  risperiDONE (RISPERDAL) tablet 4 mg (has no administration in time range)  lactated ringers bolus 1,000 mL (0 mLs Intravenous Stopped 11/05/17 2023)  sodium chloride 0.9 % bolus 2,000 mL (2,000 mLs Intravenous New Bag/Given 11/05/17 2022)     Initial Impression / Assessment and Plan / ED Course  I have reviewed the triage vital signs and the nursing notes.  Pertinent labs & imaging results that were available during my care of the patient were reviewed by me and considered in my medical decision making (see chart for details).    52 year old male with history of schizophrenia, hypertension, hyperlipidemia, "prediabetes" not on any diabetes medications presents to the emergency department today for evaluation of hyperglycemia.  Patient with heart rate low 100s at time of arrival.  Stable blood pressure.  Alert and oriented x3.  Answers questions appropriately.  Not consistent with HHS/HHNK. BMP with glucose 1020. Does have AKI. Na 117 however corrects 134 with hyperglycemia. He has AGAP of 19 and bicarb of 17, could be consistent with DKA though no kussmaul breathing and non-toxic appearing. Suspect related to intravascular depletion however. Will give bolus of IVFs and subq insulin and reassess. A1c in 6.9 in July 2019. Unclear etiology for acute rise in  glucose however could be related to home risperidone for his schizophrenia. No recent med changes.  CBC with mild leukocytosis though no other significant findings. CXR obtained for cough and shows no acute cardiac or pulmonary abnormality.  No consultation to suggest pneumonia.  Viral illness could also be provoking hyperglycemia. Hospitalist to admit for further management.  Patient monitored in ED with no acute events.  Stable time of transfer to inpatient room..   Case and plan of care discussed with Dr. Ashok Cordia.  Final Clinical Impressions(s) / ED Diagnoses   Final diagnoses:  Hyperglycemia  AKI (acute kidney injury) [facility])    ED Discharge Orders    None       Corrie Dandy, MD 11/05/17 1937    Lajean Saver, MD 11/05/17 [phone]

## 2017-11-05 NOTE — H&P (Addendum)
History and Physical    [name] [name]. QVZ:[id number] DOB: Feb 28, 1966 DOA: 11/05/2017  Referring MD/NP/PA:   PCP: Ann Held, DO   Patient coming from:  The patient is coming from home.  At baseline, pt is independent for most of ADL.     Chief Complaint: Polyuria, polydipsia  HPI: [name] [name]. is a 52 y.o. male with medical history significant of hypertension, hyperlipidemia, diabetes mellitus, depression, schizophrenia, tobacco abuse, CKD 3, who presents with polyuria and polydipsia.  Patient states that he has been having polyuria and polydipsia recently, which has worsened in the past several days.  He is not taking any diabetic medications.  He checked his sugar which is high, >600. His doctor advised him to come to the emergency room for further evaluation treatment.  Patient states that he has dry cough, but no shortness breath, chest pain.  No fever chills.  Denies nausea vomiting, diarrhea or abdominal pain.  No symptoms of UTI.  No unilateral weakness. His mother states that pt has been drinking more fluids lately including numerous sodas, chocolate milk, and other flavored beverages.   ED Course: pt was found to have DKA with blood sugar 1026, bicarbonate of 17, anion gap 19, WBC 12.1, pseudohyponatremia, pending urinalysis, worsened renal function, temperature normal, tachycardia, no tachypnea, oxygen saturation 96% on room air.  Pending chest x-ray.  Patient is placed on stepdown for obs.  Review of Systems:   General: no fevers, chills, no body weight gain, has fatigue. HEENT: no blurry vision, hearing changes or sore throat Respiratory: no dyspnea, coughing, wheezing CV: no chest pain, no palpitations GI: no nausea, vomiting, abdominal pain, diarrhea, constipation.  Has polyuria and polydipsia.   GU: no dysuria, burning on urination, increased urinary frequency, hematuria  Ext: no leg edema Neuro: no unilateral weakness, numbness, or tingling, no  vision change or hearing loss Skin: no rash, no skin tear. MSK: No muscle spasm, no deformity, no limitation of range of movement in spin Heme: No easy bruising.  Travel history: No recent long distant travel.  Allergy: No Known Allergies  Past Medical History:  Diagnosis Date  . Diabetes mellitus type II, uncontrolled ([place])   . HTN (hypertension)   . Hyperlipidemia LDL goal <70   . Schizophrenia ([place])     History reviewed. No pertinent surgical history.  Social History:  reports that he has been smoking cigarettes and cigars. He has a 60.00 pack-year smoking history. He has never used smokeless tobacco. He reports that he has current or past drug history. Drug: Marijuana. He reports that he does not drink alcohol.  Family History:  Family History  Problem Relation Age of Onset  . Breast cancer Mother   . Diabetes Mother   . Prostate cancer Father        StepFather  . Hypertension Father   . Breast cancer Maternal Aunt   . Diabetes Maternal Uncle      Prior to Admission medications   Medication Sig Start Date End Date Taking? Authorizing Provider  ALLERGY RELIEF 10 MG tablet TAKE 1 TABLET BY MOUTH EVERY DAY Patient taking differently: Take 10 mg by mouth daily.  08/23/16  Yes Ann Held, DO  aspirin EC 81 MG tablet Take 1 tablet (81 mg total) by mouth daily. 01/21/16  Yes Debbrah Alar, NP  Fluticasone-Salmeterol (ADVAIR DISKUS) 250-50 MCG/DOSE AEPB Inhale 1 puff into the lungs 2 (two) times daily. 09/30/17  Yes Roma Schanz R, DO  metoprolol  tartrate (LOPRESSOR) 25 MG tablet TAKE 1 TABLET BY MOUTH TWICE A DAY Patient taking differently: Take 25 mg by mouth 2 (two) times daily.  09/16/17  Yes Roma Schanz R, DO  mirtazapine (REMERON) 30 MG tablet Take 1 tablet (30 mg total) by mouth at bedtime. 08/09/17  Yes Arfeen, Arlyce Harman, MD  risperiDONE (RISPERDAL) 2 MG tablet TAKE 1 TABLET IN THE MORNING AND TAKE 2 TABLETS AT BEDTIME Patient taking differently:  Take 2-4 mg by mouth See admin instructions. TAKE 1 TABLET IN THE MORNING AND TAKE 2 TABLETS AT BEDTIME 08/09/17  Yes Arfeen, Arlyce Harman, MD  simvastatin (ZOCOR) 40 MG tablet TAKE 1 TABLET BY MOUTH EVERY DAY Patient taking differently: Take 40 mg by mouth daily.  08/29/17  Yes Roma Schanz R, DO  traZODone (DESYREL) 100 MG tablet Take 1 tablet (100 mg total) by mouth at bedtime. 08/09/17  Yes Arfeen, Arlyce Harman, MD  Vitamin D, Ergocalciferol, (DRISDOL) 50000 units CAPS capsule Take 1 capsule once a week by mouth. 11/22/16  Yes [provider]    Physical Exam: Vitals:   11/05/17 1801 11/05/17 1804 11/05/17 1900 11/05/17 2030  BP:  133/85 (!) 138/96 (!) 149/105  Pulse:  (!) 104 (!) 106 97  Resp:  16 18 14   Temp:  98.5 F (36.9 C)    TempSrc:  Oral    SpO2:  96% 95% 97%  Weight: 97.5 kg     Height: 5\' 9"  (1.753 m)      General: Not in acute distress.  Dry mucous membrane HEENT:       Eyes: PERRL, EOMI, no scleral icterus.       ENT: No discharge from the ears and nose, no pharynx injection, no tonsillar enlargement.        Neck: No JVD, no bruit, no mass felt. Heme: No neck lymph node enlargement. Cardiac: S1/S2, RRR, No murmurs, No gallops or rubs. Respiratory: No rales, wheezing, rhonchi or rubs. GI: Soft, nondistended, nontender, no rebound pain, no organomegaly, BS present. GU: No hematuria Ext: No pitting leg edema bilaterally. 2+DP/PT pulse bilaterally. Musculoskeletal: No joint deformities, No joint redness or warmth, no limitation of ROM in spin. Skin: No rashes.  Neuro: Alert, oriented X3, cranial nerves II-XII grossly intact, moves all extremities normally.  Psych: Patient is not psychotic, no suicidal or hemocidal ideation.  Labs on Admission: I have personally reviewed following labs and imaging studies  CBC: Recent Labs  Lab 11/05/17 1806  WBC 12.1*  HGB 15.3  HCT 45.8  MCV 89.3  PLT 536   Basic Metabolic Panel: Recent Labs  Lab 11/05/17 1806  NA  117*  K 4.5  CL 81*  CO2 17*  GLUCOSE 1,026*  BUN 25*  CREATININE 2.37*  CALCIUM 9.3   GFR: Estimated Creatinine Clearance: 43.4 mL/min (A) (by C-G formula based on SCr of 2.37 mg/dL (H)). Liver Function Tests: No results for input(s): AST, ALT, ALKPHOS, BILITOT, PROT, ALBUMIN in the last 168 hours. No results for input(s): LIPASE, AMYLASE in the last 168 hours. No results for input(s): AMMONIA in the last 168 hours. Coagulation Profile: No results for input(s): INR, PROTIME in the last 168 hours. Cardiac Enzymes: No results for input(s): CKTOTAL, CKMB, CKMBINDEX, TROPONINI in the last 168 hours. BNP (last 3 results) No results for input(s): PROBNP in the last 8760 hours. HbA1C: No results for input(s): HGBA1C in the last 72 hours. CBG: Recent Labs  Lab 11/05/17 1801  GLUCAP >600*  Lipid Profile: No results for input(s): CHOL, HDL, LDLCALC, TRIG, CHOLHDL, LDLDIRECT in the last 72 hours. Thyroid Function Tests: No results for input(s): TSH, T4TOTAL, FREET4, T3FREE, THYROIDAB in the last 72 hours. Anemia Panel: No results for input(s): VITAMINB12, FOLATE, FERRITIN, TIBC, IRON, RETICCTPCT in the last 72 hours. Urine analysis:    Component Value Date/Time   COLORURINE YELLOW 01/19/2016 1429   APPEARANCEUR CLEAR 01/19/2016 1429   LABSPEC <=1.005 (A) 01/19/2016 1429   PHURINE 7.0 01/19/2016 1429   GLUCOSEU >=1000 (A) 01/19/2016 1429   HGBUR NEGATIVE 01/19/2016 1429   BILIRUBINUR neg 01/24/2017 1533   KETONESUR NEGATIVE 01/19/2016 1429   PROTEINUR neg 01/24/2017 1533   PROTEINUR NEGATIVE 10/14/2009 0334   UROBILINOGEN 0.2 01/24/2017 1533   UROBILINOGEN 0.2 01/19/2016 1429   NITRITE neg 01/24/2017 1533   NITRITE NEGATIVE 01/19/2016 1429   LEUKOCYTESUR Negative 01/24/2017 1533   Sepsis Labs: @LABRCNTIP (procalcitonin:4,lacticidven:4) )No results found for this or any previous visit (from the past 240 hour(s)).   Radiological Exams on Admission: No results  found.   EKG: Independently reviewed.  Sinus rhythm, QTC 416, low voltage, nonspecific T wave change.    Assessment/Plan Principal Problem:   DKA (diabetic ketoacidoses) ([facility]) Active Problems:   HTN (hypertension)   Schizoaffective disorder, bipolar type ([place])   Acute renal failure superimposed on stage 3 chronic kidney disease ([facility])   Type II diabetes mellitus with renal manifestations ([facility])   Depression   Leukocytosis   Cough   Tobacco abuse   DKA (diabetic ketoacidoses) ([place]): patient has DKA with AG of 19, and bicarbonate of 17, Blood sugar 1026. Pt is not taking any diabetic medications at home currently. - place on SDU for obs. -IVF: pt was given 1L of Ringer's solution in ED, will give another 2 L normal saline bolus - start DKA protocol with BMP q4h - IVF: NS 125 cc/h; will switch to D5-1/2NS when CBG<250 - replete K as needed - Zofran prn nausea  - NPO   Type II diabetes mellitus with renal manifestations ([place]): Last A1c 6.9 on 09/30/17, well controled. Patient is not taking diabetic medications at home. Now has DKA.  His mother states that pt has been drinking more fluids lately including numerous sodas, chocolate milk, and other flavored beverages, which likely contributed.  -on DKA protocol -Check A1c  - consult to diabetic educator and case manager  Leukocytosis: no fever. Pt has mild cough which is likely due to smoking. No CP or SOB. Likely due to stress induced to demargination -will follow up blood culture and UA -f/u CXR -follow up by CBC  Cough: likely due to smoking. -prn Xopenex nebulizer - Dulera inhaler -As needed Mucinex for cough -Follow-up chest x-ray  HTN:  -Continue home medications: Metoprolol -IV hydralazine prn  Acute renal failure superimposed on stage 3 chronic kidney disease: Baseline Cre is ,1.61 on 09/30/17. pt's Cre is 2.37 and BUN 25 on admission. Likely due to dehydration, - IVF as above - Follow up renal function by  BMP  Schizoaffective disorder, bipolar type and Depression: no SI or HI. -continue home Remeron, risperidone, trazodone  Tobacco abuse: -Did counseling about importance of quitting smoking -Nicotine patch  DVT ppx:  SQ Lovenox Code Status: Full code Family Communication:    Yes, patient's  Mother  at bed side Disposition Plan:  Anticipate discharge back to previous home environment Consults called:  none Admission status:   SDU/obs  Date of Service 11/05/2017    Ivor Costa [facility]  Pager [phone]  If 7PM-7AM, please contact night-coverage [email or web address] Password [facility] 11/05/2017, 8:55 PM

## 2017-11-05 NOTE — ED Notes (Signed)
Attempted report.  Nurse to call back when available. 

## 2017-11-06 DIAGNOSIS — E1010 Type 1 diabetes mellitus with ketoacidosis without coma: Secondary | ICD-10-CM

## 2017-11-06 LAB — BASIC METABOLIC PANEL
Anion gap: 10 (ref 5–15)
Anion gap: 12 (ref 5–15)
Anion gap: 12 (ref 5–15)
Anion gap: 15 (ref 5–15)
BUN: 16 mg/dL (ref 6–20)
BUN: 16 mg/dL (ref 6–20)
BUN: 18 mg/dL (ref 6–20)
BUN: 21 mg/dL — AB (ref 6–20)
CALCIUM: 8.7 mg/dL — AB (ref 8.9–10.3)
CALCIUM: 8.9 mg/dL (ref 8.9–10.3)
CALCIUM: 9.2 mg/dL (ref 8.9–10.3)
CHLORIDE: 100 mmol/L (ref 98–111)
CO2: 18 mmol/L — ABNORMAL LOW (ref 22–32)
CO2: 21 mmol/L — ABNORMAL LOW (ref 22–32)
CO2: 22 mmol/L (ref 22–32)
CO2: 22 mmol/L (ref 22–32)
CREATININE: 1.84 mg/dL — AB (ref 0.61–1.24)
CREATININE: 1.91 mg/dL — AB (ref 0.61–1.24)
Calcium: 8.7 mg/dL — ABNORMAL LOW (ref 8.9–10.3)
Chloride: 100 mmol/L (ref 98–111)
Chloride: 101 mmol/L (ref 98–111)
Chloride: 104 mmol/L (ref 98–111)
Creatinine, Ser: 1.71 mg/dL — ABNORMAL HIGH (ref 0.61–1.24)
Creatinine, Ser: 1.99 mg/dL — ABNORMAL HIGH (ref 0.61–1.24)
GFR calc Af Amer: 44 mL/min — ABNORMAL LOW (ref >60)
GFR calc Af Amer: 48 mL/min — ABNORMAL LOW (ref >60)
GFR calc Af Amer: 52 mL/min — ABNORMAL LOW (ref >60)
GFR, EST AFRICAN AMERICAN: 46 mL/min — AB (ref >60)
GFR, EST NON AFRICAN AMERICAN: 38 mL/min — AB (ref >60)
GFR, EST NON AFRICAN AMERICAN: 40 mL/min — AB (ref >60)
GFR, EST NON AFRICAN AMERICAN: 41 mL/min — AB (ref >60)
GFR, EST NON AFRICAN AMERICAN: 45 mL/min — AB (ref >60)
GLUCOSE: 132 mg/dL — AB (ref 70–99)
GLUCOSE: 265 mg/dL — AB (ref 70–99)
GLUCOSE: 453 mg/dL — AB (ref 70–99)
Glucose, Bld: 324 mg/dL — ABNORMAL HIGH (ref 70–99)
POTASSIUM: 2.9 mmol/L — AB (ref 3.5–5.1)
POTASSIUM: 3.7 mmol/L (ref 3.5–5.1)
Potassium: 3.3 mmol/L — ABNORMAL LOW (ref 3.5–5.1)
Potassium: 4.1 mmol/L (ref 3.5–5.1)
SODIUM: 133 mmol/L — AB (ref 135–145)
SODIUM: 133 mmol/L — AB (ref 135–145)
SODIUM: 135 mmol/L (ref 135–145)
SODIUM: 136 mmol/L (ref 135–145)

## 2017-11-06 LAB — GLUCOSE, CAPILLARY
GLUCOSE-CAPILLARY: 167 mg/dL — AB (ref 70–99)
GLUCOSE-CAPILLARY: 180 mg/dL — AB (ref 70–99)
GLUCOSE-CAPILLARY: 189 mg/dL — AB (ref 70–99)
GLUCOSE-CAPILLARY: 387 mg/dL — AB (ref 70–99)
GLUCOSE-CAPILLARY: 412 mg/dL — AB (ref 70–99)
GLUCOSE-CAPILLARY: 446 mg/dL — AB (ref 70–99)
Glucose-Capillary: 268 mg/dL — ABNORMAL HIGH (ref 70–99)
Glucose-Capillary: 246 mg/dL — ABNORMAL HIGH (ref 70–99)
Glucose-Capillary: 258 mg/dL — ABNORMAL HIGH (ref 70–99)
Glucose-Capillary: 220 mg/dL — ABNORMAL HIGH (ref 70–99)
Glucose-Capillary: 192 mg/dL — ABNORMAL HIGH (ref 70–99)
Glucose-Capillary: 130 mg/dL — ABNORMAL HIGH (ref 70–99)
Glucose-Capillary: 424 mg/dL — ABNORMAL HIGH (ref 70–99)
Glucose-Capillary: 407 mg/dL — ABNORMAL HIGH (ref 70–99)

## 2017-11-06 LAB — BLOOD CULTURE ID PANEL (REFLEXED)
Acinetobacter baumannii: NOT DETECTED
CANDIDA ALBICANS: NOT DETECTED
CANDIDA PARAPSILOSIS: NOT DETECTED
CANDIDA TROPICALIS: NOT DETECTED
Candida glabrata: NOT DETECTED
Candida krusei: NOT DETECTED
ENTEROBACTERIACEAE SPECIES: NOT DETECTED
Enterobacter cloacae complex: NOT DETECTED
Enterococcus species: NOT DETECTED
Escherichia coli: NOT DETECTED
Haemophilus influenzae: NOT DETECTED
KLEBSIELLA OXYTOCA: NOT DETECTED
KLEBSIELLA PNEUMONIAE: NOT DETECTED
Listeria monocytogenes: NOT DETECTED
Methicillin resistance: NOT DETECTED
NEISSERIA MENINGITIDIS: NOT DETECTED
PROTEUS SPECIES: NOT DETECTED
Pseudomonas aeruginosa: NOT DETECTED
STAPHYLOCOCCUS SPECIES: DETECTED — AB
STREPTOCOCCUS PYOGENES: NOT DETECTED
Serratia marcescens: NOT DETECTED
Staphylococcus aureus (BCID): NOT DETECTED
Streptococcus agalactiae: NOT DETECTED
Streptococcus pneumoniae: NOT DETECTED
Streptococcus species: NOT DETECTED

## 2017-11-06 LAB — MRSA PCR SCREENING: MRSA BY PCR: NEGATIVE

## 2017-11-06 LAB — HIV ANTIBODY (ROUTINE TESTING W REFLEX): HIV SCREEN 4TH GENERATION: NONREACTIVE

## 2017-11-06 MED ORDER — CEFAZOLIN SODIUM-DEXTROSE 2-4 GM/100ML-% IV SOLN
2.0000 g | Freq: Three times a day (TID) | INTRAVENOUS | Status: DC
  Administered 2017-11-06 – 2017-11-07 (×2): 2 g via INTRAVENOUS
  Filled 2017-11-06 (×4): qty 100

## 2017-11-06 MED ORDER — POTASSIUM CHLORIDE CRYS ER 20 MEQ PO TBCR
40.0000 meq | EXTENDED_RELEASE_TABLET | Freq: Once | ORAL | Status: AC
  Administered 2017-11-06: 40 meq via ORAL
  Filled 2017-11-06: qty 2

## 2017-11-06 MED ORDER — INSULIN ASPART 100 UNIT/ML ~~LOC~~ SOLN
0.0000 [IU] | Freq: Three times a day (TID) | SUBCUTANEOUS | Status: DC
  Administered 2017-11-06: 10 [IU] via SUBCUTANEOUS
  Administered 2017-11-06: 2 [IU] via SUBCUTANEOUS
  Administered 2017-11-06: 10 [IU] via SUBCUTANEOUS
  Administered 2017-11-07: 9 [IU] via SUBCUTANEOUS

## 2017-11-06 MED ORDER — LEVALBUTEROL HCL 1.25 MG/0.5ML IN NEBU: 1.2500 mg | INHALATION_SOLUTION | Freq: Four times a day (QID) | RESPIRATORY_TRACT | Status: DC | PRN

## 2017-11-06 MED ORDER — INSULIN GLARGINE 100 UNIT/ML ~~LOC~~ SOLN
15.0000 [IU] | Freq: Two times a day (BID) | SUBCUTANEOUS | Status: DC
  Administered 2017-11-06 (×2): 15 [IU] via SUBCUTANEOUS
  Filled 2017-11-06 (×3): qty 0.15

## 2017-11-06 MED ORDER — INSULIN ASPART 100 UNIT/ML ~~LOC~~ SOLN
12.0000 [IU] | Freq: Once | SUBCUTANEOUS | Status: AC
  Administered 2017-11-06: 12 [IU] via SUBCUTANEOUS

## 2017-11-06 NOTE — Progress Notes (Signed)
PHARMACY - PHYSICIAN COMMUNICATION CRITICAL VALUE ALERT - BLOOD CULTURE IDENTIFICATION (BCID)  [name] [name]. is an 52 y.o. male who presented to [facility] on 11/05/2017 with a chief complaint of  Polyuria, polydipsia.  Assessment:  8/24  2 sets of Blood cultures drawn.  1 set , anerobic grew GPC.  Staphylococcus species detected.. Mec A not detected.  Staph aureus not detected.  1 of 2 sets - could be contaminant.    Name of physician (or Provider) Contacted:  Arby Barrette (mid level).   Current antibiotics:  None   Changes to prescribed antibiotics recommended:  Starting Cefazolin 2g IV q8h due to WBC elevated on admit and MD to follow up tomorrow.   Results for orders placed or performed during the hospital encounter of 11/05/17  Blood Culture ID Panel (Reflexed) (Collected: 11/05/2017  9:32 PM)  Result Value Ref Range   Enterococcus species NOT DETECTED NOT DETECTED   Listeria monocytogenes NOT DETECTED NOT DETECTED   Staphylococcus species DETECTED (A) NOT DETECTED   Staphylococcus aureus NOT DETECTED NOT DETECTED   Methicillin resistance NOT DETECTED NOT DETECTED   Streptococcus species NOT DETECTED NOT DETECTED   Streptococcus agalactiae NOT DETECTED NOT DETECTED   Streptococcus pneumoniae NOT DETECTED NOT DETECTED   Streptococcus pyogenes NOT DETECTED NOT DETECTED   Acinetobacter baumannii NOT DETECTED NOT DETECTED   Enterobacteriaceae species NOT DETECTED NOT DETECTED   Enterobacter cloacae complex NOT DETECTED NOT DETECTED   Escherichia coli NOT DETECTED NOT DETECTED   Klebsiella oxytoca NOT DETECTED NOT DETECTED   Klebsiella pneumoniae NOT DETECTED NOT DETECTED   Proteus species NOT DETECTED NOT DETECTED   Serratia marcescens NOT DETECTED NOT DETECTED   Haemophilus influenzae NOT DETECTED NOT DETECTED   Neisseria meningitidis NOT DETECTED NOT DETECTED   Pseudomonas aeruginosa NOT DETECTED NOT DETECTED   Candida albicans NOT DETECTED NOT DETECTED   Candida glabrata NOT DETECTED NOT DETECTED   Candida krusei NOT DETECTED NOT DETECTED   Candida parapsilosis NOT DETECTED NOT DETECTED   Candida tropicalis NOT DETECTED NOT DETECTED   Nicole Cella, RPh Clinical Pharmacist 11/06/2017  6:35 PM

## 2017-11-06 NOTE — Progress Notes (Signed)
Inpatient Diabetes Program Recommendations  AACE/ADA: New Consensus Statement on Inpatient Glycemic Control (2015)  Target Ranges:  Prepandial:   less than 140 mg/dL      Peak postprandial:   less than 180 mg/dL (1-2 hours)      Critically ill patients:  140 - 180 mg/dL   Lab Results  Component Value Date   GLUCAP 180 (H) 11/06/2017   HGBA1C 6.9 (H) 09/30/2017    Review of Glycemic Control  Diabetes history: DM Outpatient Diabetes medications: None listed Current orders for Inpatient glycemic control: Lantus 15 units prior to D/C of IV insulin drip   Inpatient Diabetes Program Recommendations:    Noted A1c 6.9 on 09/30/17, and patient has had polydipsia and  Polyuria over the past few weeks.    Will plan to see patient in am. Nurses, when patient meets criteria to discontinue IV insulin, please give Lantus 2 hrs prior to discontinued IV insulin and cover CBG with Novolog correction @ time of D/C. Will follow.  Thank you, Nani Gasser. Tondra Reierson, RN, MSN, CDE  Diabetes Coordinator Inpatient Glycemic Control Team Team Pager [phone] (8am-5pm) 11/06/2017 7:58 AM

## 2017-11-06 NOTE — Progress Notes (Signed)
PROGRESS NOTE    [name] [name].  UTM:[id number] DOB: 01/20/1966 DOA: 11/05/2017 PCP: Ann Held, DO    Brief Narrative 52 year old with past medical history relevant for hypertension, type 2 diabetes on oral hypoglycemics, schizoaffective disorder, stage III CKD, depression, hyperlipidemia, asthma/COPD who presented with DKA.   Assessment & Plan:   Principal Problem:   DKA (diabetic ketoacidoses) ([place]) Active Problems:   HTN (hypertension)   Schizoaffective disorder, bipolar type ([place])   Acute renal failure superimposed on stage 3 chronic kidney disease (HCC)   Type II diabetes mellitus with renal manifestations (HCC)   Depression   Leukocytosis   Cough   Tobacco abuse   #) DKA with type 2 diabetes: On review of the chart patient's last A1c was relatively low.  He has never been on insulin.  Indeed he does not appear to have been on any oral hypoglycemics either.  Suspect that there is a combination of his obesity and his atypical antipsychotics playing a role into the metabolic syndrome and additionally as he is African-American possible LABA.  As of now his gap is closed and his hyperglycemia is well controlled.  Unfortunately a diabetes regimen for him will be limited by the fact that he has underlying psychiatric issues. - Pending hemoglobin A1c from 11/05/2017 -Discontinue IV insulin and overlap with glargine 15 units twice daily -Carb restricted diet - Sliding g scale insulin, AC at bedtime -Diabetic teaching, likely with mom who he lives with -Last TSH on 09/30/2017 was normal  #) Psych/pain: - Continue risperidone 2 mg every morning and 4 mg nightly -Continue mirtazapine 30 mg nightly - Continue trazodone 100 mg nightly  #) Hypertension/hyperlipidemia: -Continue aspirin 81 mg -Continue metoprolol tartrate 25 mg twice daily -Continue simvastatin 40 mg daily  #) Asthma/COPD: -Continue ICS/LABA  #) Acute on chronic stage III CKD: Stable -AKI  resolved with IV fluids  Fluids: IV fluids Electrolytes: Monitor and supplement Nutrition: Carb restricted diet  Prophylaxis: Enoxaparin  Disposition: Pending subcu insulin and teaching  Full code  Consultants:   None  Procedures:   None  Antimicrobials:   None   Subjective: Patient reports he is doing fairly well.  He denies any current polyuria or polydipsia.  He reports he is never been on insulin or even any antidiabetic medications.  He denies any nausea, vomiting, diarrhea, cough, congestion, rhinorrhea.  Objective: Vitals:   11/05/17 2310 11/06/17 0157 11/06/17 0731 11/06/17 0851  BP: (!) 136/96  125/80   Pulse: (!) 101  98   Resp:   17   Temp:   99 F (37.2 C)   TempSrc:   Oral   SpO2:  96% 97% 93%  Weight:      Height:        Intake/Output Summary (Last 24 hours) at 11/06/2017 0923 Last data filed at 11/06/2017 0816 Gross per 24 hour  Intake 3758.12 ml  Output 1900 ml  Net 1858.12 ml   Filed Weights   11/05/17 1801  Weight: 97.5 kg    Examination:  General exam: Appears calm and comfortable  Respiratory system: Clear to auscultation. Respiratory effort normal. Cardiovascular system: Regular rate and rhythm, no murmurs Gastrointestinal system: Abdomen is nondistended, soft and nontender. No organomegaly or masses felt. Normal bowel sounds heard. Central nervous system: Alert and oriented. No focal neurological deficits. Extremities: Trace lower extremity edema Skin: No rashes visible skin Psychiatry: Judgement and insight appear impaired. Mood & affect flat    Data Reviewed: I have personally  reviewed following labs and imaging studies  CBC: Recent Labs  Lab 11/05/17 1806  WBC 12.1*  HGB 15.3  HCT 45.8  MCV 89.3  PLT 220   Basic Metabolic Panel: Recent Labs  Lab 11/05/17 1806 11/05/17 2132 11/06/17 0011 11/06/17 0403 11/06/17 0816  NA 117* 128* 133* 135 136  K 4.5 4.0 4.1 3.3* 2.9*  CL 81* 91* 100 101 104  CO2 17* 19* 18*  22 22  GLUCOSE 1,026* 704* 453* 265* 132*  BUN 25* 23* 21* 18 16  CREATININE 2.37* 2.18* 1.99* 1.84* 1.71*  CALCIUM 9.3 9.1 9.2 8.9 8.7*   GFR: Estimated Creatinine Clearance: 60.2 mL/min (A) (by C-G formula based on SCr of 1.71 mg/dL (H)). Liver Function Tests: No results for input(s): AST, ALT, ALKPHOS, BILITOT, PROT, ALBUMIN in the last 168 hours. No results for input(s): LIPASE, AMYLASE in the last 168 hours. No results for input(s): AMMONIA in the last 168 hours. Coagulation Profile: No results for input(s): INR, PROTIME in the last 168 hours. Cardiac Enzymes: No results for input(s): CKTOTAL, CKMB, CKMBINDEX, TROPONINI in the last 168 hours. BNP (last 3 results) No results for input(s): PROBNP in the last 8760 hours. HbA1C: No results for input(s): HGBA1C in the last 72 hours. CBG: Recent Labs  Lab 11/06/17 0355 11/06/17 0503 11/06/17 0609 11/06/17 0715 11/06/17 0814  GLUCAP 258* 220* 192* 180* 130*   Lipid Profile: No results for input(s): CHOL, HDL, LDLCALC, TRIG, CHOLHDL, LDLDIRECT in the last 72 hours. Thyroid Function Tests: No results for input(s): TSH, T4TOTAL, FREET4, T3FREE, THYROIDAB in the last 72 hours. Anemia Panel: No results for input(s): VITAMINB12, FOLATE, FERRITIN, TIBC, IRON, RETICCTPCT in the last 72 hours. Sepsis Labs: No results for input(s): PROCALCITON, LATICACIDVEN in the last 168 hours.  Recent Results (from the past 240 hour(s))  Culture, blood (routine x 2)     Status: None (Preliminary result)   Collection Time: 11/05/17  9:32 PM  Result Value Ref Range Status   Specimen Description BLOOD LEFT FOREARM  Final   Special Requests   Final    BOTTLES DRAWN AEROBIC AND ANAEROBIC Blood Culture adequate volume   Culture PENDING  Incomplete   Report Status PENDING  Incomplete  MRSA PCR Screening     Status: None   Collection Time: 11/06/17 12:57 AM  Result Value Ref Range Status   MRSA by PCR NEGATIVE NEGATIVE Final    Comment:        The  GeneXpert MRSA Assay (FDA approved for NASAL specimens only), is one component of a comprehensive MRSA colonization surveillance program. It is not intended to diagnose MRSA infection nor to guide or monitor treatment for MRSA infections. Performed at [facility], [place] [address], [place], [place] [address]          Radiology Studies: Dg Chest 2 View  Result Date: 11/05/2017 CLINICAL DATA:  Cough. EXAM: CHEST - 2 VIEW COMPARISON:  01/24/2017 FINDINGS: The cardiomediastinal contours are normal. Minimal chronic bronchitic change, stable allowing for differences in technique. Pulmonary vasculature is normal. No consolidation, pleural effusion, or pneumothorax. Remote right rib fractures. No acute osseous abnormalities are seen. IMPRESSION: Stable chronic bronchitic change without acute abnormality. Electronically Signed   By: Jeb Levering M.D.   On: 11/05/2017 22:00        Scheduled Meds: . aspirin EC  81 mg Oral Daily  . enoxaparin (LOVENOX) injection  40 mg Subcutaneous QHS  . insulin aspart  0-9 Units Subcutaneous TID WC  .  insulin glargine  15 Units Subcutaneous BID  . levalbuterol  1.25 mg Nebulization Q6H  . loratadine  10 mg Oral Daily  . metoprolol tartrate  25 mg Oral BID  . mirtazapine  30 mg Oral QHS  . mometasone-formoterol  2 puff Inhalation BID  . nicotine  21 mg Transdermal Daily  . risperiDONE  2 mg Oral Daily  . risperiDONE  4 mg Oral QHS  . simvastatin  40 mg Oral Daily  . traZODone  100 mg Oral QHS   Continuous Infusions: . dextrose 5 % and 0.45% NaCl 125 mL/hr at 11/06/17 0800  . insulin (NOVOLIN-R) infusion 3.4 Units/hr (11/06/17 0816)     LOS: 0 days    Time spent: [place], MD [facility]  If 7PM-7AM, please contact night-coverage [email or web address] Password [facility] 11/06/2017, 9:23 AM

## 2017-11-07 DIAGNOSIS — Z6831 Body mass index (BMI) 31.0-31.9, adult: Secondary | ICD-10-CM | POA: Diagnosis not present

## 2017-11-07 DIAGNOSIS — E1122 Type 2 diabetes mellitus with diabetic chronic kidney disease: Secondary | ICD-10-CM | POA: Diagnosis present

## 2017-11-07 DIAGNOSIS — Z79899 Other long term (current) drug therapy: Secondary | ICD-10-CM | POA: Diagnosis not present

## 2017-11-07 DIAGNOSIS — N179 Acute kidney failure, unspecified: Secondary | ICD-10-CM | POA: Diagnosis present

## 2017-11-07 DIAGNOSIS — F79 Unspecified intellectual disabilities: Secondary | ICD-10-CM | POA: Diagnosis present

## 2017-11-07 DIAGNOSIS — F17210 Nicotine dependence, cigarettes, uncomplicated: Secondary | ICD-10-CM | POA: Diagnosis present

## 2017-11-07 DIAGNOSIS — R739 Hyperglycemia, unspecified: Secondary | ICD-10-CM | POA: Diagnosis present

## 2017-11-07 DIAGNOSIS — Z8042 Family history of malignant neoplasm of prostate: Secondary | ICD-10-CM | POA: Diagnosis not present

## 2017-11-07 DIAGNOSIS — Z803 Family history of malignant neoplasm of breast: Secondary | ICD-10-CM | POA: Diagnosis not present

## 2017-11-07 DIAGNOSIS — G4730 Sleep apnea, unspecified: Secondary | ICD-10-CM | POA: Diagnosis present

## 2017-11-07 DIAGNOSIS — J449 Chronic obstructive pulmonary disease, unspecified: Secondary | ICD-10-CM | POA: Diagnosis present

## 2017-11-07 DIAGNOSIS — Z716 Tobacco abuse counseling: Secondary | ICD-10-CM | POA: Diagnosis not present

## 2017-11-07 DIAGNOSIS — I129 Hypertensive chronic kidney disease with stage 1 through stage 4 chronic kidney disease, or unspecified chronic kidney disease: Secondary | ICD-10-CM | POA: Diagnosis present

## 2017-11-07 DIAGNOSIS — F250 Schizoaffective disorder, bipolar type: Secondary | ICD-10-CM | POA: Diagnosis present

## 2017-11-07 DIAGNOSIS — Z7951 Long term (current) use of inhaled steroids: Secondary | ICD-10-CM | POA: Diagnosis not present

## 2017-11-07 DIAGNOSIS — Z8249 Family history of ischemic heart disease and other diseases of the circulatory system: Secondary | ICD-10-CM | POA: Diagnosis not present

## 2017-11-07 DIAGNOSIS — E785 Hyperlipidemia, unspecified: Secondary | ICD-10-CM | POA: Diagnosis present

## 2017-11-07 DIAGNOSIS — Z833 Family history of diabetes mellitus: Secondary | ICD-10-CM | POA: Diagnosis not present

## 2017-11-07 DIAGNOSIS — D72829 Elevated white blood cell count, unspecified: Secondary | ICD-10-CM | POA: Diagnosis present

## 2017-11-07 DIAGNOSIS — Z7982 Long term (current) use of aspirin: Secondary | ICD-10-CM | POA: Diagnosis not present

## 2017-11-07 DIAGNOSIS — J302 Other seasonal allergic rhinitis: Secondary | ICD-10-CM | POA: Diagnosis present

## 2017-11-07 DIAGNOSIS — N183 Chronic kidney disease, stage 3 (moderate): Secondary | ICD-10-CM | POA: Diagnosis present

## 2017-11-07 DIAGNOSIS — E669 Obesity, unspecified: Secondary | ICD-10-CM | POA: Diagnosis present

## 2017-11-07 DIAGNOSIS — R0902 Hypoxemia: Secondary | ICD-10-CM | POA: Diagnosis present

## 2017-11-07 DIAGNOSIS — E1010 Type 1 diabetes mellitus with ketoacidosis without coma: Secondary | ICD-10-CM | POA: Diagnosis not present

## 2017-11-07 DIAGNOSIS — E1110 Type 2 diabetes mellitus with ketoacidosis without coma: Secondary | ICD-10-CM | POA: Diagnosis present

## 2017-11-07 LAB — GLUCOSE, CAPILLARY
GLUCOSE-CAPILLARY: 298 mg/dL — AB (ref 70–99)
Glucose-Capillary: 587 mg/dL (ref 70–99)
Glucose-Capillary: 351 mg/dL — ABNORMAL HIGH (ref 70–99)
Glucose-Capillary: 545 mg/dL (ref 70–99)
Glucose-Capillary: 178 mg/dL — ABNORMAL HIGH (ref 70–99)

## 2017-11-07 LAB — HEMOGLOBIN A1C
HEMOGLOBIN A1C: 11.3 % — AB (ref 4.8–5.6)
Mean Plasma Glucose: 278 mg/dL

## 2017-11-07 LAB — BASIC METABOLIC PANEL
Anion gap: 9 (ref 5–15)
BUN: 18 mg/dL (ref 6–20)
BUN: 18 mg/dL (ref 6–20)
CO2: 17 mmol/L — ABNORMAL LOW (ref 22–32)
Chloride: 105 mmol/L (ref 98–111)
Chloride: 101 mmol/L (ref 98–111)
Creatinine, Ser: 1.98 mg/dL — ABNORMAL HIGH (ref 0.61–1.24)
GFR calc Af Amer: 47 mL/min — ABNORMAL LOW (ref >60)
Potassium: 4.1 mmol/L (ref 3.5–5.1)
Potassium: 3.8 mmol/L (ref 3.5–5.1)
Sodium: 133 mmol/L — ABNORMAL LOW (ref 135–145)

## 2017-11-07 LAB — BASIC METABOLIC PANEL WITH GFR
Anion gap: 12 (ref 5–15)
CO2: 19 mmol/L — ABNORMAL LOW (ref 22–32)
Calcium: 8.2 mg/dL — ABNORMAL LOW (ref 8.9–10.3)
Calcium: 8.4 mg/dL — ABNORMAL LOW (ref 8.9–10.3)
Creatinine, Ser: 1.88 mg/dL — ABNORMAL HIGH (ref 0.61–1.24)
GFR calc Af Amer: 44 mL/min — ABNORMAL LOW (ref >60)
GFR calc non Af Amer: 40 mL/min — ABNORMAL LOW (ref >60)
GFR calc non Af Amer: 38 mL/min — ABNORMAL LOW (ref >60)
Glucose, Bld: 319 mg/dL — ABNORMAL HIGH (ref 70–99)
Glucose, Bld: 381 mg/dL — ABNORMAL HIGH (ref 70–99)
Sodium: 130 mmol/L — ABNORMAL LOW (ref 135–145)

## 2017-11-07 LAB — MAGNESIUM: Magnesium: 2.2 mg/dL (ref 1.7–2.4)

## 2017-11-07 LAB — GLUCOSE, RANDOM: Glucose, Bld: 540 mg/dL (ref 70–99)

## 2017-11-07 MED ORDER — INSULIN GLARGINE 100 UNIT/ML ~~LOC~~ SOLN
40.0000 [IU] | Freq: Every day | SUBCUTANEOUS | Status: DC
  Administered 2017-11-07: 40 [IU] via SUBCUTANEOUS
  Filled 2017-11-07: qty 0.4

## 2017-11-07 MED ORDER — INSULIN GLARGINE 100 UNIT/ML ~~LOC~~ SOLN
60.0000 [IU] | Freq: Every day | SUBCUTANEOUS | Status: DC
  Administered 2017-11-08: 60 [IU] via SUBCUTANEOUS
  Filled 2017-11-07: qty 0.6

## 2017-11-07 MED ORDER — INSULIN ASPART 100 UNIT/ML ~~LOC~~ SOLN
0.0000 [IU] | Freq: Three times a day (TID) | SUBCUTANEOUS | Status: DC
  Administered 2017-11-07 – 2017-11-08 (×3): 8 [IU] via SUBCUTANEOUS

## 2017-11-07 MED ORDER — REPAGLINIDE 2 MG PO TABS
2.0000 mg | ORAL_TABLET | Freq: Three times a day (TID) | ORAL | Status: DC
  Administered 2017-11-07 – 2017-11-08 (×3): 2 mg via ORAL
  Filled 2017-11-07 (×5): qty 1

## 2017-11-07 MED ORDER — INSULIN ASPART 100 UNIT/ML ~~LOC~~ SOLN
15.0000 [IU] | Freq: Once | SUBCUTANEOUS | Status: AC
  Administered 2017-11-07: 15 [IU] via SUBCUTANEOUS

## 2017-11-07 MED ORDER — INSULIN STARTER KIT- PEN NEEDLES (ENGLISH)
1.0000 | Freq: Once | Status: AC
  Administered 2017-11-08: 1
  Filled 2017-11-07: qty 1

## 2017-11-07 MED ORDER — INSULIN ASPART 100 UNIT/ML ~~LOC~~ SOLN: 0.0000 [IU] | Freq: Every day | SUBCUTANEOUS | Status: DC

## 2017-11-07 MED ORDER — LIVING WELL WITH DIABETES BOOK
Freq: Once | Status: AC
  Administered 2017-11-07: 17:00:00
  Filled 2017-11-07 (×2): qty 1

## 2017-11-07 NOTE — Progress Notes (Signed)
Placed patient on CPAP per order, via nasal mask, 10.0 cm H20 with  2 lpm O2.  Tolerating well at this time.

## 2017-11-07 NOTE — Progress Notes (Signed)
Inpatient Diabetes Program Recommendations  AACE/ADA: New Consensus Statement on Inpatient Glycemic Control (2015)  Target Ranges:  Prepandial:   less than 140 mg/dL      Peak postprandial:   less than 180 mg/dL (1-2 hours)      Critically ill patients:  140 - 180 mg/dL   Lab Results  Component Value Date   JYNWGN 562 (HH) 11/07/2017   HGBA1C 11.3 (H) 11/06/2017    Review of Glycemic Control Results for [name] [name], [name] [name] (MRN [id number]) as of 11/07/2017 14:51  Ref. Range 11/06/2017 13:15 11/06/2017 17:30 11/06/2017 21:07 11/06/2017 23:12 11/07/2017 07:41 11/07/2017 11:27  Glucose-Capillary Latest Ref Range: 70 - 99 mg/dL 412 (H) 424 (H) 446 (H) 407 (H) 351 (H) 545 (HH)   Diabetes history: New onset DM Outpatient Diabetes medications: None Current orders for Inpatient glycemic control:  Lantus 60 units daily (just increased), Novolog moderate tid with meals and HS  Inpatient Diabetes Program Recommendations:       Spoke with patient and mother at length.  Patient states that he was told that his blood sugars were high 2 years ago but never started on medications.  A1C in July of 2019 was 6.9% as patient see's Dr. Etter Sjogren as PCP at [facility].       He reports a recent loss of appetite.  He states that he usually sleeps til 12 noon and does not eat breakfast or lunch.  Mother states he does eat Cheese to take his medications.  Patient endorses regular intake of soda, frappucino's, and juice.  He states that he "drinks often" because the medications he takes, dehydrate him. I encouraged patient to drink water instead of sodas and juice for hydration.      Mother was in wheelchair and does live with patient Per chart review patient lived in group home recently until December of 2018 when he moved back in with his mother.  We briefly reviewed what diabetes is, normal blood sugars, goals of education, and signs of high blood sugars during our visit.  When I asked patient to teach back,  he states that a normal blood sugar for him is about 112 mg/dL and too "high" is over 180 mg/dL.  I asked Mom if she would be able to assist him with medication management such as insulin.  She states "I do not like needles".     Will order basic DM teaching by bedside RN.  This is a difficult situation as I am unsure patient has insight/ judgment to manage DM at home?  Will ask RN to document all teaching and f/u with patient and mother tomorrow.   Thanks,  Adah Perl, RN, BC-ADM Inpatient Diabetes Coordinator Pager [phone] (8a-5p)

## 2017-11-07 NOTE — Progress Notes (Signed)
PROGRESS NOTE    [name] [name].  SWF:[id number] DOB: 14-Apr-1965 DOA: 11/05/2017 PCP: Ann Held, DO    Brief Narrative 52 year old with past medical history relevant for hypertension, type 2 diabetes on oral hypoglycemics, schizoaffective disorder, stage III CKD, depression, hyperlipidemia, asthma/COPD who presented with DKA.   Assessment & Plan:   Principal Problem:   DKA (diabetic ketoacidoses) ([place]) Active Problems:   HTN (hypertension)   Schizoaffective disorder, bipolar type ([place])   Acute renal failure superimposed on stage 3 chronic kidney disease (HCC)   Type II diabetes mellitus with renal manifestations (HCC)   Depression   Leukocytosis   Cough   Tobacco abuse   #) DKA with type 2 diabetes: Unfortunately patient continues to have significant preprandial hyperglycemia.  The logistics of the patient giving himself insulin is quite fraught as his underlying psychiatric issues make this difficult. - Pending hemoglobin A1c from 11/05/2017 -Increase to glargine 40 units every morning -We will discuss with diabetic education about starting mealtime insulin or possibly once weekly or daily GLP-1 antagonist -Diabetic educator consult -Carb restricted diet - Sliding g scale insulin, AC at bedtime -Diabetic teaching, likely with caretaker  #) Psych/pain: - Continue risperidone 2 mg every morning and 4 mg nightly -Continue mirtazapine 30 mg nightly - Continue trazodone 100 mg nightly  #) Undiagnosed sleep apnea: Last night patient had an episode of bradycardia and hypercapnia.  Patient was put on CPAP.  Likely patient will need outpatient sleep study.  #) Positive blood culture: Blood culture from 11/05/2017 1 out of 2 was positive for MSSA. -Repeat blood cultures -We will hold on antibiotics as no signs or symptoms of infection  #) Hypertension/hyperlipidemia: -Continue aspirin 81 mg -Continue metoprolol tartrate 25 mg twice daily -Continue simvastatin  40 mg daily  #) Asthma/COPD: -Continue ICS/LABA  #) Acute on chronic stage III CKD: Resolved  Fluids: Tolerating p.o. Electrolytes: Monitor and supplement Nutrition: Carb restricted diet  Prophylaxis: Enoxaparin  Disposition: Pending subcu insulin and teaching  Full code  Consultants:   None  Procedures:   None  Antimicrobials:   None   Subjective: Patient is doing fairly well.  He just woke up after being on the CPAP machine.  He denies any nausea, vomiting, diarrhea, cough, congestion, rhinorrhea.  Objective: Vitals:   11/06/17 2300 11/07/17 0327 11/07/17 0706 11/07/17 0743  BP: 122/83  107/80   Pulse: 80 91 85   Resp: (!) 22 14 15    Temp: 98.5 F (36.9 C)  98.6 F (37 C)   TempSrc: Oral  Axillary   SpO2: 92%  96% 96%  Weight:      Height:        Intake/Output Summary (Last 24 hours) at 11/07/2017 0914 Last data filed at 11/06/2017 1700 Gross per 24 hour  Intake 1310 ml  Output -  Net 1310 ml   Filed Weights   11/05/17 1801  Weight: 97.5 kg    Examination:  General exam: Appears calm and comfortable  Respiratory system: Clear to auscultation. Respiratory effort normal. Cardiovascular system: Regular rate and rhythm, no murmurs Gastrointestinal system: Abdomen is nondistended, soft and nontender. No organomegaly or masses felt. Normal bowel sounds heard. Central nervous system: Alert and oriented. No focal neurological deficits. Extremities: Trace lower extremity edema Skin: No rashes visible skin Psychiatry: Judgement and insight appear impaired. Mood & affect flat    Data Reviewed: I have personally reviewed following labs and imaging studies  CBC: Recent Labs  Lab 11/05/17 1806  WBC 12.1*  HGB 15.3  HCT 45.8  MCV 89.3  PLT 646   Basic Metabolic Panel: Recent Labs  Lab 11/06/17 0011 11/06/17 0403 11/06/17 0816 11/06/17 1203 11/07/17 0256  NA 133* 135 136 133* 133*  K 4.1 3.3* 2.9* 3.7 4.1  CL 100 101 104 100 105  CO2 18*  22 22 21* 19*  GLUCOSE 453* 265* 132* 324* 319*  BUN 21* 18 16 16 18   CREATININE 1.99* 1.84* 1.71* 1.91* 1.88*  CALCIUM 9.2 8.9 8.7* 8.7* 8.2*  MG  --   --   --   --  2.2   GFR: Estimated Creatinine Clearance: 54.7 mL/min (A) (by C-G formula based on SCr of 1.88 mg/dL (H)). Liver Function Tests: No results for input(s): AST, ALT, ALKPHOS, BILITOT, PROT, ALBUMIN in the last 168 hours. No results for input(s): LIPASE, AMYLASE in the last 168 hours. No results for input(s): AMMONIA in the last 168 hours. Coagulation Profile: No results for input(s): INR, PROTIME in the last 168 hours. Cardiac Enzymes: No results for input(s): CKTOTAL, CKMB, CKMBINDEX, TROPONINI in the last 168 hours. BNP (last 3 results) No results for input(s): PROBNP in the last 8760 hours. HbA1C: No results for input(s): HGBA1C in the last 72 hours. CBG: Recent Labs  Lab 11/06/17 1315 11/06/17 1730 11/06/17 2107 11/06/17 2312 11/07/17 0741  GLUCAP 412* 424* 446* 407* 351*   Lipid Profile: No results for input(s): CHOL, HDL, LDLCALC, TRIG, CHOLHDL, LDLDIRECT in the last 72 hours. Thyroid Function Tests: No results for input(s): TSH, T4TOTAL, FREET4, T3FREE, THYROIDAB in the last 72 hours. Anemia Panel: No results for input(s): VITAMINB12, FOLATE, FERRITIN, TIBC, IRON, RETICCTPCT in the last 72 hours. Sepsis Labs: No results for input(s): PROCALCITON, LATICACIDVEN in the last 168 hours.  Recent Results (from the past 240 hour(s))  Culture, blood (routine x 2)     Status: None (Preliminary result)   Collection Time: 11/05/17  9:32 PM  Result Value Ref Range Status   Specimen Description BLOOD RIGHT HAND  Final   Special Requests   Final    BOTTLES DRAWN AEROBIC AND ANAEROBIC Blood Culture adequate volume   Culture  Setup Time   Final    GRAM POSITIVE COCCI ANAEROBIC BOTTLE ONLY CRITICAL RESULT CALLED TO, READ BACK BY AND VERIFIED WITH: [place], PHARMD AT 8032 ON 11/06/17 BY C. JESSUP, MLT. Performed at  [facility], [place] [address] [place], [place] [address]    Culture GRAM POSITIVE COCCI  Final   Report Status PENDING  Incomplete  Culture, blood (routine x 2)     Status: None (Preliminary result)   Collection Time: 11/05/17  9:32 PM  Result Value Ref Range Status   Specimen Description BLOOD LEFT FOREARM  Final   Special Requests   Final    BOTTLES DRAWN AEROBIC AND ANAEROBIC Blood Culture adequate volume   Culture   Final    NO GROWTH < 24 HOURS Performed at [facility], [place] [address], [place], [place] [address]    Report Status PENDING  Incomplete  Blood Culture ID Panel (Reflexed)     Status: Abnormal   Collection Time: 11/05/17  9:32 PM  Result Value Ref Range Status   Enterococcus species NOT DETECTED NOT DETECTED Final   Listeria monocytogenes NOT DETECTED NOT DETECTED Final   Staphylococcus species DETECTED (A) NOT DETECTED Final    Comment: Methicillin (oxacillin) susceptible coagulase negative staphylococcus. Possible blood culture contaminant (unless isolated from more than  one blood culture draw or clinical case suggests pathogenicity). No antibiotic treatment is indicated for blood  culture contaminants. CRITICAL RESULT CALLED TO, READ BACK BY AND VERIFIED WITH: [place], PHARMD AT 2637 ON 11/06/17 BY C. JESSUP, MLT.    Staphylococcus aureus NOT DETECTED NOT DETECTED Final   Methicillin resistance NOT DETECTED NOT DETECTED Final   Streptococcus species NOT DETECTED NOT DETECTED Final   Streptococcus agalactiae NOT DETECTED NOT DETECTED Final   Streptococcus pneumoniae NOT DETECTED NOT DETECTED Final   Streptococcus pyogenes NOT DETECTED NOT DETECTED Final   Acinetobacter baumannii NOT DETECTED NOT DETECTED Final   Enterobacteriaceae species NOT DETECTED NOT DETECTED Final   Enterobacter cloacae complex NOT DETECTED NOT DETECTED Final   Escherichia coli NOT DETECTED NOT DETECTED Final   Klebsiella oxytoca NOT DETECTED NOT DETECTED Final   Klebsiella  pneumoniae NOT DETECTED NOT DETECTED Final   Proteus species NOT DETECTED NOT DETECTED Final   Serratia marcescens NOT DETECTED NOT DETECTED Final   Haemophilus influenzae NOT DETECTED NOT DETECTED Final   Neisseria meningitidis NOT DETECTED NOT DETECTED Final   Pseudomonas aeruginosa NOT DETECTED NOT DETECTED Final   Candida albicans NOT DETECTED NOT DETECTED Final   Candida glabrata NOT DETECTED NOT DETECTED Final   Candida krusei NOT DETECTED NOT DETECTED Final   Candida parapsilosis NOT DETECTED NOT DETECTED Final   Candida tropicalis NOT DETECTED NOT DETECTED Final    Comment: Performed at [facility], [address], [place], [place] [address]  MRSA PCR Screening     Status: None   Collection Time: 11/06/17 12:57 AM  Result Value Ref Range Status   MRSA by PCR NEGATIVE NEGATIVE Final    Comment:        The GeneXpert MRSA Assay (FDA approved for NASAL specimens only), is one component of a comprehensive MRSA colonization surveillance program. It is not intended to diagnose MRSA infection nor to guide or monitor treatment for MRSA infections. Performed at [facility], [place] [address], [place], [place] [address]          Radiology Studies: Dg Chest 2 View  Result Date: 11/05/2017 CLINICAL DATA:  Cough. EXAM: CHEST - 2 VIEW COMPARISON:  01/24/2017 FINDINGS: The cardiomediastinal contours are normal. Minimal chronic bronchitic change, stable allowing for differences in technique. Pulmonary vasculature is normal. No consolidation, pleural effusion, or pneumothorax. Remote right rib fractures. No acute osseous abnormalities are seen. IMPRESSION: Stable chronic bronchitic change without acute abnormality. Electronically Signed   By: Jeb Levering M.D.   On: 11/05/2017 22:00        Scheduled Meds: . aspirin EC  81 mg Oral Daily  . enoxaparin (LOVENOX) injection  40 mg Subcutaneous QHS  . insulin aspart  0-9 Units Subcutaneous TID WC  . insulin glargine  40  Units Subcutaneous Daily  . loratadine  10 mg Oral Daily  . metoprolol tartrate  25 mg Oral BID  . mirtazapine  30 mg Oral QHS  . mometasone-formoterol  2 puff Inhalation BID  . nicotine  21 mg Transdermal Daily  . risperiDONE  2 mg Oral Daily  . risperiDONE  4 mg Oral QHS  . simvastatin  40 mg Oral Daily  . traZODone  100 mg Oral QHS   Continuous Infusions: .  ceFAZolin (ANCEF) IV 2 g (11/07/17 0653)  . insulin (NOVOLIN-R) infusion Stopped (11/06/17 1014)     LOS: 0 days    Time spent: 35    Cristy Folks, MD  [facility]  If 7PM-7AM, please contact night-coverage [email or web address] Password [facility] 11/07/2017, 9:14 AM

## 2017-11-07 NOTE — Progress Notes (Signed)
Patient HR frequently dropping to 30's with oxygen sats dropping also. PA notified agrees to use CPAP at night. RT notified to bring to bedside .

## 2017-11-08 LAB — CULTURE, BLOOD (ROUTINE X 2): Special Requests: ADEQUATE

## 2017-11-08 LAB — BASIC METABOLIC PANEL WITH GFR
Anion gap: 12 (ref 5–15)
BUN: 18 mg/dL (ref 6–20)
Chloride: 104 mmol/L (ref 98–111)
Potassium: 3.8 mmol/L (ref 3.5–5.1)
Sodium: 134 mmol/L — ABNORMAL LOW (ref 135–145)

## 2017-11-08 LAB — CBC
HCT: 39.1 % (ref 39.0–52.0)
Hemoglobin: 13.4 g/dL (ref 13.0–17.0)
MCH: 30 pg (ref 26.0–34.0)
MCHC: 34.3 g/dL (ref 30.0–36.0)
MCV: 87.7 fL (ref 78.0–100.0)
Platelets: 337 10*3/uL (ref 150–400)
RBC: 4.46 MIL/uL (ref 4.22–5.81)
RDW: 12.5 % (ref 11.5–15.5)
WBC: 8.5 K/uL (ref 4.0–10.5)

## 2017-11-08 LAB — BASIC METABOLIC PANEL
CO2: 18 mmol/L — ABNORMAL LOW (ref 22–32)
Calcium: 8.4 mg/dL — ABNORMAL LOW (ref 8.9–10.3)
Creatinine, Ser: 1.71 mg/dL — ABNORMAL HIGH (ref 0.61–1.24)
GFR calc Af Amer: 52 mL/min — ABNORMAL LOW (ref >60)
GFR calc non Af Amer: 45 mL/min — ABNORMAL LOW (ref >60)
Glucose, Bld: 268 mg/dL — ABNORMAL HIGH (ref 70–99)

## 2017-11-08 LAB — GLUCOSE, CAPILLARY
GLUCOSE-CAPILLARY: 277 mg/dL — AB (ref 70–99)
GLUCOSE-CAPILLARY: 254 mg/dL — AB (ref 70–99)

## 2017-11-08 LAB — C-PEPTIDE: C-Peptide: 4 ng/mL (ref 1.1–4.4)

## 2017-11-08 MED ORDER — BLOOD GLUCOSE MONITOR KIT: PACK | 0 refills | Status: DC

## 2017-11-08 MED ORDER — INSULIN PEN NEEDLE 32G X 6 MM MISC: 1.0000 [IU] | 1 refills | Status: DC

## 2017-11-08 MED ORDER — REPAGLINIDE 2 MG PO TABS: 2.0000 mg | ORAL_TABLET | Freq: Three times a day (TID) | ORAL | 1 refills | Status: DC

## 2017-11-08 MED ORDER — INSULIN GLARGINE 100 UNIT/ML ~~LOC~~ SOLN: 60.0000 [IU] | Freq: Every day | SUBCUTANEOUS | 11 refills | Status: DC

## 2017-11-08 MED ORDER — INSULIN STARTER KIT- PEN NEEDLES (ENGLISH): 1.0000 | Freq: Once | 0 refills | Status: AC

## 2017-11-08 NOTE — Progress Notes (Signed)
PROGRESS NOTE    [name] [name].  OVF:[id number] DOB: 1966/03/12 DOA: 11/05/2017 PCP: Ann Held, DO    Brief Narrative 52 year old with past medical history relevant for hypertension, type 2 diabetes on oral hypoglycemics, schizoaffective disorder, stage III CKD, depression, hyperlipidemia, asthma/COPD who presented with DKA.   Assessment & Plan:   Principal Problem:   DKA (diabetic ketoacidoses) ([place]) Active Problems:   HTN (hypertension)   Schizoaffective disorder, bipolar type ([place])   Acute renal failure superimposed on stage 3 chronic kidney disease (HCC)   Type II diabetes mellitus with renal manifestations (HCC)   Depression   Leukocytosis   Cough   Tobacco abuse   #) DKA with type 2 diabetes: DKA is resolved.  Postprandial hyperglycemia has improved with preprandial meglitidines.  Unfortunately his mother does not feel comfortable giving him insulin and he himself is not able to learn because of his underlying psychiatric problems as well as his intellectual disability. - hemoglobin A1c from 11/06/2017 11.3 -glargine 60 units every morning -Continue repaglinide 2mg  TID with meals -Diabetic educator consult -Carb restricted diet - Sliding g scale insulin, AC at bedtime -Diabetic teaching, likely with caretaker -Social work consult for possible return to group home which patient left approximately 9 months ago she could more consistently get both his medications and his insulin there.  #) Psych/pain: - Continue risperidone 2 mg every morning and 4 mg nightly -Continue mirtazapine 30 mg nightly - Continue trazodone 100 mg nightly  #) Undiagnosed sleep apnea: Patient was started on CPAP.  He had overnight hypoxia and bradycardia -Continue CPAP -We will order outpatient sleep study  #) Positive blood culture: -Blood culture from 11/05/2017 1 out of 2 was positive for MSSA. -Repeat blood cultures 11/07/2017-  #) Hypertension/hyperlipidemia: -Continue  aspirin 81 mg -Continue metoprolol tartrate 25 mg twice daily -Continue simvastatin 40 mg daily  #) Asthma/COPD: -Continue ICS/LABA  #) Acute on chronic stage III CKD: Resolved  Fluids: Tolerating p.o. Electrolytes: Monitor and supplement Nutrition: Carb restricted diet  Prophylaxis: Enoxaparin  Disposition: Pending subcu insulin and teaching as well as safe discharge plan  Full code  Consultants:   None  Procedures:   None  Antimicrobials:   None   Subjective: Patient is doing fairly well.  He does not have any complaints.  He appears to have poor insight into his current condition.  He denies any nausea, vomiting, abdominal pain, cough, congestion, rhinorrhea.  Objective: Vitals:   11/08/17 0027 11/08/17 0300 11/08/17 0717 11/08/17 1107  BP:  116/79 110/78 116/77  Pulse: 83 80 89 69  Resp: (!) 21 13 14 16   Temp:  97.8 F (36.6 C) 98.1 F (36.7 C) 97.6 F (36.4 C)  TempSrc:  Oral Oral Oral  SpO2: 98% 98% 98% 99%  Weight:      Height:        Intake/Output Summary (Last 24 hours) at 11/08/2017 1234 Last data filed at 11/07/2017 1539 Gross per 24 hour  Intake -  Output 1500 ml  Net -1500 ml   Filed Weights   11/05/17 1801  Weight: 97.5 kg    Examination:  General exam: Appears calm and comfortable  Respiratory system: Clear to auscultation. Respiratory effort normal. Cardiovascular system: Regular rate and rhythm, no murmurs Gastrointestinal system: Abdomen is nondistended, soft and nontender. No organomegaly or masses felt. Normal bowel sounds heard. Central nervous system: Alert and oriented. No focal neurological deficits. Extremities: Trace lower extremity edema Skin: No rashes visible skin Psychiatry: Judgement and  insight appear impaired. Mood & affect flat    Data Reviewed: I have personally reviewed following labs and imaging studies  CBC: Recent Labs  Lab 11/05/17 1806 11/08/17 0216  WBC 12.1* 8.5  HGB 15.3 13.4  HCT 45.8 39.1    MCV 89.3 87.7  PLT 322 932   Basic Metabolic Panel: Recent Labs  Lab 11/06/17 0816 11/06/17 1203 11/07/17 0256 11/07/17 1235 11/07/17 1512 11/08/17 0216  NA 136 133* 133*  --  130* 134*  K 2.9* 3.7 4.1  --  3.8 3.8  CL 104 100 105  --  101 104  CO2 22 21* 19*  --  17* 18*  GLUCOSE 132* 324* 319* 540* 381* 268*  BUN 16 16 18   --  18 18  CREATININE 1.71* 1.91* 1.88*  --  1.98* 1.71*  CALCIUM 8.7* 8.7* 8.2*  --  8.4* 8.4*  MG  --   --  2.2  --   --   --    GFR: Estimated Creatinine Clearance: 60.2 mL/min (A) (by C-G formula based on SCr of 1.71 mg/dL (H)). Liver Function Tests: No results for input(s): AST, ALT, ALKPHOS, BILITOT, PROT, ALBUMIN in the last 168 hours. No results for input(s): LIPASE, AMYLASE in the last 168 hours. No results for input(s): AMMONIA in the last 168 hours. Coagulation Profile: No results for input(s): INR, PROTIME in the last 168 hours. Cardiac Enzymes: No results for input(s): CKTOTAL, CKMB, CKMBINDEX, TROPONINI in the last 168 hours. BNP (last 3 results) No results for input(s): PROBNP in the last 8760 hours. HbA1C: Recent Labs    11/06/17 0011  HGBA1C 11.3*   CBG: Recent Labs  Lab 11/07/17 1127 11/07/17 1625 11/07/17 2100 11/08/17 0809 11/08/17 1136  GLUCAP 545* 298* 178* 277* 254*   Lipid Profile: No results for input(s): CHOL, HDL, LDLCALC, TRIG, CHOLHDL, LDLDIRECT in the last 72 hours. Thyroid Function Tests: No results for input(s): TSH, T4TOTAL, FREET4, T3FREE, THYROIDAB in the last 72 hours. Anemia Panel: No results for input(s): VITAMINB12, FOLATE, FERRITIN, TIBC, IRON, RETICCTPCT in the last 72 hours. Sepsis Labs: No results for input(s): PROCALCITON, LATICACIDVEN in the last 168 hours.  Recent Results (from the past 240 hour(s))  Culture, blood (routine x 2)     Status: Abnormal   Collection Time: 11/05/17  9:32 PM  Result Value Ref Range Status   Specimen Description BLOOD RIGHT HAND  Final   Special Requests    Final    BOTTLES DRAWN AEROBIC AND ANAEROBIC Blood Culture adequate volume   Culture  Setup Time   Final    GRAM POSITIVE COCCI ANAEROBIC BOTTLE ONLY CRITICAL RESULT CALLED TO, READ BACK BY AND VERIFIED WITH: [place], PHARMD AT 3557 ON 11/06/17 BY C. JESSUP, MLT.    Culture (A)  Final    STAPHYLOCOCCUS SPECIES (COAGULASE NEGATIVE) THE SIGNIFICANCE OF ISOLATING THIS ORGANISM FROM A SINGLE SET OF BLOOD CULTURES WHEN MULTIPLE SETS ARE DRAWN IS UNCERTAIN. PLEASE NOTIFY THE MICROBIOLOGY DEPARTMENT WITHIN ONE WEEK IF SPECIATION AND SENSITIVITIES ARE REQUIRED. Performed at [facility], [place] [address], [place], [place] [address]    Report Status 11/08/2017 FINAL  Final  Culture, blood (routine x 2)     Status: None (Preliminary result)   Collection Time: 11/05/17  9:32 PM  Result Value Ref Range Status   Specimen Description BLOOD LEFT FOREARM  Final   Special Requests   Final    BOTTLES DRAWN AEROBIC AND ANAEROBIC Blood Culture adequate volume  Culture   Final    NO GROWTH 3 DAYS Performed at [facility], [place] [address] [place], [place] [address]    Report Status PENDING  Incomplete  Blood Culture ID Panel (Reflexed)     Status: Abnormal   Collection Time: 11/05/17  9:32 PM  Result Value Ref Range Status   Enterococcus species NOT DETECTED NOT DETECTED Final   Listeria monocytogenes NOT DETECTED NOT DETECTED Final   Staphylococcus species DETECTED (A) NOT DETECTED Final    Comment: Methicillin (oxacillin) susceptible coagulase negative staphylococcus. Possible blood culture contaminant (unless isolated from more than one blood culture draw or clinical case suggests pathogenicity). No antibiotic treatment is indicated for blood  culture contaminants. CRITICAL RESULT CALLED TO, READ BACK BY AND VERIFIED WITH: [place], PHARMD AT 4010 ON 11/06/17 BY C. JESSUP, MLT.    Staphylococcus aureus NOT DETECTED NOT DETECTED Final   Methicillin resistance NOT DETECTED NOT DETECTED  Final   Streptococcus species NOT DETECTED NOT DETECTED Final   Streptococcus agalactiae NOT DETECTED NOT DETECTED Final   Streptococcus pneumoniae NOT DETECTED NOT DETECTED Final   Streptococcus pyogenes NOT DETECTED NOT DETECTED Final   Acinetobacter baumannii NOT DETECTED NOT DETECTED Final   Enterobacteriaceae species NOT DETECTED NOT DETECTED Final   Enterobacter cloacae complex NOT DETECTED NOT DETECTED Final   Escherichia coli NOT DETECTED NOT DETECTED Final   Klebsiella oxytoca NOT DETECTED NOT DETECTED Final   Klebsiella pneumoniae NOT DETECTED NOT DETECTED Final   Proteus species NOT DETECTED NOT DETECTED Final   Serratia marcescens NOT DETECTED NOT DETECTED Final   Haemophilus influenzae NOT DETECTED NOT DETECTED Final   Neisseria meningitidis NOT DETECTED NOT DETECTED Final   Pseudomonas aeruginosa NOT DETECTED NOT DETECTED Final   Candida albicans NOT DETECTED NOT DETECTED Final   Candida glabrata NOT DETECTED NOT DETECTED Final   Candida krusei NOT DETECTED NOT DETECTED Final   Candida parapsilosis NOT DETECTED NOT DETECTED Final   Candida tropicalis NOT DETECTED NOT DETECTED Final    Comment: Performed at [facility], [address] [place], [place] [address]  MRSA PCR Screening     Status: None   Collection Time: 11/06/17 12:57 AM  Result Value Ref Range Status   MRSA by PCR NEGATIVE NEGATIVE Final    Comment:        The GeneXpert MRSA Assay (FDA approved for NASAL specimens only), is one component of a comprehensive MRSA colonization surveillance program. It is not intended to diagnose MRSA infection nor to guide or monitor treatment for MRSA infections. Performed at [facility], [place] [address], [place], [place] [address]   Culture, blood (routine x 2)     Status: None (Preliminary result)   Collection Time: 11/07/17  9:35 AM  Result Value Ref Range Status   Specimen Description BLOOD LEFT ANTECUBITAL  Final   Special Requests   Final     BOTTLES DRAWN AEROBIC ONLY Blood Culture results may not be optimal due to an inadequate volume of blood received in culture bottles   Culture   Final    NO GROWTH 1 DAY Performed at [facility], [place] [address], [place], [place] [address]    Report Status PENDING  Incomplete  Culture, blood (routine x 2)     Status: None (Preliminary result)   Collection Time: 11/07/17  9:40 AM  Result Value Ref Range Status   Specimen Description BLOOD LEFT ANTECUBITAL  Final   Special Requests  Final    BOTTLES DRAWN AEROBIC ONLY Blood Culture results may not be optimal due to an inadequate volume of blood received in culture bottles   Culture   Final    NO GROWTH 1 DAY Performed at [place] [address] [place], [place] [address]    Report Status PENDING  Incomplete         Radiology Studies: No results found.      Scheduled Meds: . aspirin EC  81 mg Oral Daily  . enoxaparin (LOVENOX) injection  40 mg Subcutaneous QHS  . insulin aspart  0-15 Units Subcutaneous TID WC  . insulin aspart  0-5 Units Subcutaneous QHS  . insulin glargine  60 Units Subcutaneous Daily  . loratadine  10 mg Oral Daily  . metoprolol tartrate  25 mg Oral BID  . mirtazapine  30 mg Oral QHS  . mometasone-formoterol  2 puff Inhalation BID  . nicotine  21 mg Transdermal Daily  . repaglinide  2 mg Oral TID AC  . risperiDONE  2 mg Oral Daily  . risperiDONE  4 mg Oral QHS  . simvastatin  40 mg Oral Daily  . traZODone  100 mg Oral QHS   Continuous Infusions:    LOS: 1 day    Time spent: [place], MD [facility]  If 7PM-7AM, please contact night-coverage [email or web address] Password [facility] 11/08/2017, 12:34 PM

## 2017-11-08 NOTE — Care Management (Signed)
11-08-17 BENEFITS CHECK :  #  2.  S/W [facility]  @ [facility] # [phone]  1. LANTUS  PEN 60 UNITS  A DAY COVER- YES CO-PAY- $ 47.50 TIER- 3 DRUG PRIOR APPROVAL- NO  2. PRANDIN 1 MG TID COVER-  NONE FORMULARY PRIOR APPROVAL- YES    # [id number]  PREFERRED PHARMACY : YES   [facility]

## 2017-11-08 NOTE — Progress Notes (Signed)
Inpatient Diabetes Program Recommendations  AACE/ADA: New Consensus Statement on Inpatient Glycemic Control (Jun 12, 2013)  Target Ranges:  Prepandial:   less than 140 mg/dL      Peak postprandial:   less than 180 mg/dL (1-2 hours)      Critically ill patients:  140 - 180 mg/dL   Lab Results  Component Value Date   GLUCAP 254 (H) 11/08/2017   HGBA1C 11.3 (H) 11/06/2017    Review of Glycemic ControlResults for [name] [name], [name] [name] (MRN [id number]) as of 11/08/2017 13:47  Ref. Range 11/07/2017 11:27 11/07/2017 16:25 11/07/2017 21:00 11/08/2017 08:09 11/08/2017 11:36  Glucose-Capillary Latest Ref Range: 70 - 99 mg/dL 545 (HH) 298 (H) 178 (H) 277 (H) 254 (H)    Diabetes history: Type 2 DM  Outpatient Diabetes medications: None Current orders for Inpatient glycemic control:  Lantus 60 units daily (just increased), Novolog moderate tid with meals and HS, Prandin 2 mg tid with meals  Inpatient Diabetes Program Recommendations:    Spoke with case manager who called mother regarding patient and potential assistance with insulin.  She had to get off the phone abruptly to go to dr's appointment.  Received call from RN that a "caregiver" was present in the room named [name] who helps patient and mother.  Visited patient with [name] and learned that [name] assists patient with medications and care.  At one time patient had lived with [place] after leaving group home in 06/12/12 after father's death.  However, [name] states that patient now lives with Mother (since December 2018) and he calls patient 2-3 times a day to assist with care.  He also visits 2-3 times a week.  He states that he will be able to help daily with diabetes management and that mother called him yesterday to assure that he could do this for them.         We reviewed at length diabetes, signs and symptoms of high and low blood sugars, how to check blood sugars and when to check (3 times a day prior to meals) and basic meal planning using the plate  method (handout given).  [name] states that he struggles to get patient to eat meals and that patient usually "drinks" lots of sodas throughout the day.  He sleeps until noon every day.  [name] told patient that all the sodas were removed from the house and patient did not seem happy.  I explained to patient that [name] would be the one to help administer insulin daily.  Briefly demonstrated use of insulin pen to caregiver.  He is familiar with insulin pen use as he states that he also takes insulin daily.  We still reviewed site/location of injections, rotation, how to put on pen needle, 2 unit prime and administration.  He feels very confident with this.  He asked what blood sugar he should be concerned about.  I explained that Dr. Lynnda Child need to be alerted if fasting blood sugars less than 100 mg/dL due to risk of hypoglycemia.  I also taught patient (with caregiver present) the signs and symptoms and treatment for low blood sugars.  Patient at first answered he would nee medicine but then I reminded him that he would need "sugar" such as juice or regular soda to bring blood sugar up.  He plans to follow-up with Dr. Etter Sjogren.  [name], states that patient does not know that his mother is in the ER at this time.  If patient is d/c'd tomorrow, [name] states that he can pick patient up  after 11.  Called and discussed new plan with Dr. Herbert Moors.  Will follow- up on 8/28.  Also discussed with case Freight forwarder.   Thanks,  Adah Perl, RN, BC-ADM Inpatient Diabetes Coordinator Pager [phone] (8a-5p)

## 2017-11-08 NOTE — Progress Notes (Signed)
  RD consulted for nutrition education regarding diabetes.   Lab Results  Component Value Date   HGBA1C 11.3 (H) 11/06/2017   Labs reviewed: CBGS: [id number] (inpatient orders for glycemic control are 0-15 units insulin aspart TID, 0-5 units insulin aspart q HS, and 60 units insulin glargine daily).   Per discharge notes, pt will discharge on 60 units insulin glargine daily.   Attempted to see pt x 2. Pt was with diabetes coordinator at time of visit. Second attempt was alone in room, with no family or caregiver. DM coordinator has educated pt's caregiver extensively and plan to discharge today,  Crystina Borrayo A. Jimmye Norman, RD, LDN, CDE Pager: [phone] After hours Pager: [phone]

## 2017-11-08 NOTE — Progress Notes (Signed)
Verbal and written discharge instructions given to patient and patient's primary home care giver, [name]. Written prescriptions given to care giver. All questions answered. PIVs removed per discharge protocol. All personal belongings returned to patient. Patient taken by floor RN via wheelchair to care giver's waiting car for discharge.

## 2017-11-08 NOTE — Discharge Instructions (Signed)

## 2017-11-08 NOTE — Care Management Note (Signed)
Case Management Note  Patient Details  Name: [name] [name]. MRN: [id number] Date of Birth: 14-Apr-1965  Subjective/Objective:   Pt admitted with DKA                 Action/Plan:  Pt from home with mom - per attending pt has some cognitive delays.  Pt previously resided in group home however recently he has been staying with his mother.  Pt has a caregiver named [name] that will assist with medication regimen.  Mom denied barriers with pt returning home - when CM asked mom about ensuring pt receives his daily medications - mom stated she would ensure this is done.  HHRN for disease management requested -  CM was able to get [facility] to accept pt (barrier of options secondary to insurance acceptance).  CM provided care giver phone number to [facility] as well as mothers.  Agency informed pt will discharge home today in care of [name].   Expected Discharge Date:  11/08/17               Expected Discharge Plan:  Home/Self Care  In-House Referral:  Clinical Social Work  Discharge planning Services  CM Consult  Post Acute Care Choice:    Choice offered to:     DME Arranged:    DME Agency:     HH Arranged:    HH Agency:     Status of Service:  Completed, signed off  If discussed at [employer], dates discussed:    Additional Comments:  Maryclare Labrador, RN 11/08/2017, 2:13 PM

## 2017-11-08 NOTE — Discharge Summary (Signed)
Physician Discharge Summary  [name] [name]. KGU:[id number] DOB: Dec 18, 1965 DOA: 11/05/2017  PCP: Ann Held, DO  Admit date: 11/05/2017 Discharge date: 11/08/2017  Admitted From: Home  Disposition:  Home  Recommendations for Outpatient Follow-up:  1. Follow up with PCP in 1-2 weeks 2. Please obtain BMP/CBC in one week, repeat A1c in 3 months 3. Please follow up on the following pending results: anti-insulin antibodies, anti-gad antibodies  Home Health: no Equipment/Devices:glucometer  Discharge Condition:stable CODE STATUS: FULL Diet recommendation: Carb Modified   Brief/Interim Summary:  #) DKA with type 2 diabetes: Patient was admitted with DKA and maintain on insulin infusion.  His gap closed and he was transitioned to subcutaneous insulin.  He proved to be quite resistant to single dose glargine in the morning.  Because of various social factors including the patient's own psychiatric history and inability to learn how to give himself insulin as well as the inability to find a caretaker who could give him mealtime insulin it was decided to transition the patient to insulin glargine 60 units in the morning and mealtime Prandin 2 mg.  His blood sugars improved dramatically on this.  He was maintained on a carb restricting diet.  Diabetes coronation was consulted and extensive education was offered to the patient, family, caregiver.  Patient will be given outpatient follow-up with ambulatory diabetes management.  #) Psych/Pain: Patient was continued on home risperidone, mirtazapine, trazodone.  #) Undiagnosed sleep apnea: Overnight patient had episodes of bradycardia and hypercapnia while sleeping.  He was noted to be hypoxic.  He was placed on CPAP with good resolution of symptoms.  He will be discharged with an outpatient sleep study.  #) Positive blood culture: Blood culture on admission on 11/05/2017 was 1 out of 2 positive for MSSA.  This was felt to be a contaminant.   Repeat blood culture on 11/07/2017 has been no growth thus far.  #) Hypertension/hyperlipidemia: Patient was continued on aspirin, metoprolol, simvastatin.Marland Kitchen  #) Asthma: Patient was continued on ICS/LABA.Marland Kitchen  #) Acute on chronic stage III kidney disease: Patient's AKI resolved with IV fluids.  Discharge Diagnoses:  Principal Problem:   DKA (diabetic ketoacidoses) ([place]) Active Problems:   HTN (hypertension)   Schizoaffective disorder, bipolar type ([place])   Acute renal failure superimposed on stage 3 chronic kidney disease (HCC)   Type II diabetes mellitus with renal manifestations (HCC)   Depression   Leukocytosis   Cough   Tobacco abuse    Discharge Instructions  Discharge Instructions    Call MD for:  difficulty breathing, headache or visual disturbances   Complete by:  As directed    Call MD for:  hives   Complete by:  As directed    Call MD for:  persistant nausea and vomiting   Complete by:  As directed    Call MD for:  redness, tenderness, or signs of infection (pain, swelling, redness, odor or green/yellow discharge around incision site)   Complete by:  As directed    Call MD for:  severe uncontrolled pain   Complete by:  As directed    Call MD for:  temperature >100.4   Complete by:  As directed    Diet - low sodium heart healthy   Complete by:  As directed    Discharge instructions   Complete by:  As directed    Please follow up with your PCP in 1-2 weeks. Please take your insulin as prescribed and check your blood sugar before each meal and  before giviing yourself insulin.   Increase activity slowly   Complete by:  As directed    Nocturnal polysomnography   Complete by:  As directed    Where should this test be performed:  [facility] - Pulmonary   Referral to Nutrition and Diabetes Services   Complete by:  As directed    Choose type of Diabetes Self-Management Training (DSMT) training services and number of hours requested:  Initial DSMT: 10 hours   Check all special  needs that apply to patient requiring 1 on 1 DSMT:  Impaired mental status/cognition   DSMT Content:  Basic nutrition management   Choose the type of Medical Nutrition Therapy (MNT) and number of hours:  Initial MNT: 3 hours   FOR MEDICARE PATIENTS: I hereby certify that I am managing this beneficiary's diabetes condition and that the above prescribed training is a necessary part of management.:  Yes     Allergies as of 11/08/2017   No Known Allergies     Medication List    TAKE these medications   ALLERGY RELIEF 10 MG tablet Generic drug:  loratadine TAKE 1 TABLET BY MOUTH EVERY DAY What changed:  how much to take   aspirin EC 81 MG tablet Take 1 tablet (81 mg total) by mouth daily.   blood glucose meter kit and supplies Kit Dispense based on patient and insurance preference. Use up to four times daily as directed. (FOR ICD-9 250.00, 250.01).   Fluticasone-Salmeterol 250-50 MCG/DOSE Aepb Commonly known as:  ADVAIR Inhale 1 puff into the lungs 2 (two) times daily.   insulin glargine 100 UNIT/ML injection Commonly known as:  LANTUS Inject 0.6 mLs (60 Units total) into the skin daily. Start taking on:  11/09/2017   Insulin Pen Needle 32G X 6 MM Misc 1 Units by Does not apply route every morning.   insulin starter kit- pen needles Misc 1 kit by Other route once for 1 dose.   metoprolol tartrate 25 MG tablet Commonly known as:  LOPRESSOR TAKE 1 TABLET BY MOUTH TWICE A DAY   mirtazapine 30 MG tablet Commonly known as:  REMERON Take 1 tablet (30 mg total) by mouth at bedtime.   repaglinide 2 MG tablet Commonly known as:  PRANDIN Take 1 tablet (2 mg total) by mouth 3 (three) times daily before meals.   risperiDONE 2 MG tablet Commonly known as:  RISPERDAL TAKE 1 TABLET IN THE MORNING AND TAKE 2 TABLETS AT BEDTIME What changed:    how much to take  how to take this  when to take this   simvastatin 40 MG tablet Commonly known as:  ZOCOR TAKE 1 TABLET BY MOUTH  EVERY DAY   traZODone 100 MG tablet Commonly known as:  DESYREL Take 1 tablet (100 mg total) by mouth at bedtime.   Vitamin D (Ergocalciferol) 50000 units Caps capsule Commonly known as:  DRISDOL Take 1 capsule once a week by mouth.       No Known Allergies  Consultations:  Diabetes educator   Procedures/Studies: Dg Chest 2 View  Result Date: 11/05/2017 CLINICAL DATA:  Cough. EXAM: CHEST - 2 VIEW COMPARISON:  01/24/2017 FINDINGS: The cardiomediastinal contours are normal. Minimal chronic bronchitic change, stable allowing for differences in technique. Pulmonary vasculature is normal. No consolidation, pleural effusion, or pneumothorax. Remote right rib fractures. No acute osseous abnormalities are seen. IMPRESSION: Stable chronic bronchitic change without acute abnormality. Electronically Signed   By: Jeb Levering M.D.   On: 11/05/2017 22:00  Subjective:   Discharge Exam: Vitals:   11/08/17 0717 11/08/17 1107  BP: 110/78 116/77  Pulse: 89 69  Resp: 14 16  Temp: 98.1 F (36.7 C) 97.6 F (36.4 C)  SpO2: 98% 99%   Vitals:   11/08/17 0027 11/08/17 0300 11/08/17 0717 11/08/17 1107  BP:  116/79 110/78 116/77  Pulse: 83 80 89 69  Resp: (!) _0 Temp:  97.8 F (36.6 C) 98.1 F (36.7 C) 97.6 F (36.4 C)  TempSrc:  Oral Oral Oral  SpO2: 98% 98% 98% 99%  Weight:      Height:         General exam: Appears calm and comfortable  Respiratory system: Clear to auscultation. Respiratory effort normal. Cardiovascular system: Regular rate and rhythm, no murmurs Gastrointestinal system: Abdomen is nondistended, soft and nontender. No organomegaly or masses felt. Normal bowel sounds heard. Central nervous system: Alert and oriented. No focal neurological deficits. Extremities: Trace lower extremity edema Skin: No rashes visible skin Psychiatry: Judgement and insight appear impaired. Mood & affect flat    The results of significant diagnostics from this  hospitalization (including imaging, microbiology, ancillary and laboratory) are listed below for reference.     Microbiology: Recent Results (from the past 240 hour(s))  Culture, blood (routine x 2)     Status: Abnormal   Collection Time: 11/05/17  9:32 PM  Result Value Ref Range Status   Specimen Description BLOOD RIGHT HAND  Final   Special Requests   Final    BOTTLES DRAWN AEROBIC AND ANAEROBIC Blood Culture adequate volume   Culture  Setup Time   Final    GRAM POSITIVE COCCI ANAEROBIC BOTTLE ONLY CRITICAL RESULT CALLED TO, READ BACK BY AND VERIFIED WITH: R. [place], PHARMD AT 1815 ON 11/06/17 BY C. JESSUP, MLT.    Culture (A)  Final    STAPHYLOCOCCUS SPECIES (COAGULASE NEGATIVE) THE SIGNIFICANCE OF ISOLATING THIS ORGANISM FROM A SINGLE SET OF BLOOD CULTURES WHEN MULTIPLE SETS ARE DRAWN IS UNCERTAIN. PLEASE NOTIFY THE MICROBIOLOGY DEPARTMENT WITHIN ONE WEEK IF SPECIATION AND SENSITIVITIES ARE REQUIRED. Performed at [facility], [place] [address], [place], [place] [address]    Report Status 11/08/2017 FINAL  Final  Culture, blood (routine x 2)     Status: None (Preliminary result)   Collection Time: 11/05/17  9:32 PM  Result Value Ref Range Status   Specimen Description BLOOD LEFT FOREARM  Final   Special Requests   Final    BOTTLES DRAWN AEROBIC AND ANAEROBIC Blood Culture adequate volume   Culture   Final    NO GROWTH 3 DAYS Performed at [facility], [place] [address] [place], [place] [address]    Report Status PENDING  Incomplete  Blood Culture ID Panel (Reflexed)     Status: Abnormal   Collection Time: 11/05/17  9:32 PM  Result Value Ref Range Status   Enterococcus species NOT DETECTED NOT DETECTED Final   Listeria monocytogenes NOT DETECTED NOT DETECTED Final   Staphylococcus species DETECTED (A) NOT DETECTED Final    Comment: Methicillin (oxacillin) susceptible coagulase negative staphylococcus. Possible blood culture contaminant (unless isolated from more than  one blood culture draw or clinical case suggests pathogenicity). No antibiotic treatment is indicated for blood  culture contaminants. CRITICAL RESULT CALLED TO, READ BACK BY AND VERIFIED WITH: [place], PHARMD AT 2774 ON 11/06/17 BY C. JESSUP, MLT.    Staphylococcus aureus NOT DETECTED NOT DETECTED Final   Methicillin resistance NOT DETECTED  NOT DETECTED Final   Streptococcus species NOT DETECTED NOT DETECTED Final   Streptococcus agalactiae NOT DETECTED NOT DETECTED Final   Streptococcus pneumoniae NOT DETECTED NOT DETECTED Final   Streptococcus pyogenes NOT DETECTED NOT DETECTED Final   Acinetobacter baumannii NOT DETECTED NOT DETECTED Final   Enterobacteriaceae species NOT DETECTED NOT DETECTED Final   Enterobacter cloacae complex NOT DETECTED NOT DETECTED Final   Escherichia coli NOT DETECTED NOT DETECTED Final   Klebsiella oxytoca NOT DETECTED NOT DETECTED Final   Klebsiella pneumoniae NOT DETECTED NOT DETECTED Final   Proteus species NOT DETECTED NOT DETECTED Final   Serratia marcescens NOT DETECTED NOT DETECTED Final   Haemophilus influenzae NOT DETECTED NOT DETECTED Final   Neisseria meningitidis NOT DETECTED NOT DETECTED Final   Pseudomonas aeruginosa NOT DETECTED NOT DETECTED Final   Candida albicans NOT DETECTED NOT DETECTED Final   Candida glabrata NOT DETECTED NOT DETECTED Final   Candida krusei NOT DETECTED NOT DETECTED Final   Candida parapsilosis NOT DETECTED NOT DETECTED Final   Candida tropicalis NOT DETECTED NOT DETECTED Final    Comment: Performed at [facility], [place] [address] [place], [place] [address]  MRSA PCR Screening     Status: None   Collection Time: 11/06/17 12:57 AM  Result Value Ref Range Status   MRSA by PCR NEGATIVE NEGATIVE Final    Comment:        The GeneXpert MRSA Assay (FDA approved for NASAL specimens only), is one component of a comprehensive MRSA colonization surveillance program. It is not intended to diagnose MRSA infection  nor to guide or monitor treatment for MRSA infections. Performed at [facility], [place] [address], [place], [place] [address]   Culture, blood (routine x 2)     Status: None (Preliminary result)   Collection Time: 11/07/17  9:35 AM  Result Value Ref Range Status   Specimen Description BLOOD LEFT ANTECUBITAL  Final   Special Requests   Final    BOTTLES DRAWN AEROBIC ONLY Blood Culture results may not be optimal due to an inadequate volume of blood received in culture bottles   Culture   Final    NO GROWTH 1 DAY Performed at [facility], [place] [address] [place], [place] [address]    Report Status PENDING  Incomplete  Culture, blood (routine x 2)     Status: None (Preliminary result)   Collection Time: 11/07/17  9:40 AM  Result Value Ref Range Status   Specimen Description BLOOD LEFT ANTECUBITAL  Final   Special Requests   Final    BOTTLES DRAWN AEROBIC ONLY Blood Culture results may not be optimal due to an inadequate volume of blood received in culture bottles   Culture   Final    NO GROWTH 1 DAY Performed at [facility], [place] [address], [place], [place] [address]    Report Status PENDING  Incomplete     Labs: BNP (last 3 results) No results for input(s): BNP in the last 8760 hours. Basic Metabolic Panel: Recent Labs  Lab 11/06/17 0816 11/06/17 1203 11/07/17 0256 11/07/17 1235 11/07/17 1512 11/08/17 0216  NA 136 133* 133*  --  130* 134*  K 2.9* 3.7 4.1  --  3.8 3.8  CL 104 100 105  --  101 104  CO2 22 21* 19*  --  17* 18*  GLUCOSE 132* 324* 319* 540* 381* 268*  BUN _0 --  18 18  CREATININE 1.71*  1.91* 1.88*  --  1.98* 1.71*  CALCIUM 8.7* 8.7* 8.2*  --  8.4* 8.4*  MG  --   --  2.2  --   --   --    Liver Function Tests: No results for input(s): AST, ALT, ALKPHOS, BILITOT, PROT, ALBUMIN in the last 168 hours. No results for input(s): LIPASE, AMYLASE in the last 168 hours. No results for input(s): AMMONIA in the last 168 hours. CBC: Recent  Labs  Lab 11/05/17 1806 11/08/17 0216  WBC 12.1* 8.5  HGB 15.3 13.4  HCT 45.8 39.1  MCV 89.3 87.7  PLT 322 337   Cardiac Enzymes: No results for input(s): CKTOTAL, CKMB, CKMBINDEX, TROPONINI in the last 168 hours. BNP: Invalid input(s): POCBNP CBG: Recent Labs  Lab 11/07/17 1127 11/07/17 1625 11/07/17 2100 11/08/17 0809 11/08/17 1136  GLUCAP 545* 298* 178* 277* 254*   D-Dimer No results for input(s): DDIMER in the last 72 hours. Hgb A1c Recent Labs    11/06/17 0011  HGBA1C 11.3*   Lipid Profile No results for input(s): CHOL, HDL, LDLCALC, TRIG, CHOLHDL, LDLDIRECT in the last 72 hours. Thyroid function studies No results for input(s): TSH, T4TOTAL, T3FREE, THYROIDAB in the last 72 hours.  Invalid input(s): FREET3 Anemia work up No results for input(s): VITAMINB12, FOLATE, FERRITIN, TIBC, IRON, RETICCTPCT in the last 72 hours. Urinalysis    Component Value Date/Time   COLORURINE STRAW (A) 11/05/2017 2131   APPEARANCEUR CLEAR 11/05/2017 2131   LABSPEC 1.016 11/05/2017 2131   PHURINE 6.0 11/05/2017 2131   GLUCOSEU >=500 (A) 11/05/2017 2131   GLUCOSEU >=1000 (A) 01/19/2016 1429   HGBUR MODERATE (A) 11/05/2017 2131   BILIRUBINUR NEGATIVE 11/05/2017 2131   BILIRUBINUR neg 01/24/2017 1533   KETONESUR 20 (A) 11/05/2017 2131   PROTEINUR 30 (A) 11/05/2017 2131   UROBILINOGEN 0.2 01/24/2017 1533   UROBILINOGEN 0.2 01/19/2016 1429   NITRITE NEGATIVE 11/05/2017 2131   LEUKOCYTESUR NEGATIVE 11/05/2017 2131   Sepsis Labs Invalid input(s): PROCALCITONIN,  WBC,  LACTICIDVEN Microbiology Recent Results (from the past 240 hour(s))  Culture, blood (routine x 2)     Status: Abnormal   Collection Time: 11/05/17  9:32 PM  Result Value Ref Range Status   Specimen Description BLOOD RIGHT HAND  Final   Special Requests   Final    BOTTLES DRAWN AEROBIC AND ANAEROBIC Blood Culture adequate volume   Culture  Setup Time   Final    GRAM POSITIVE COCCI ANAEROBIC BOTTLE  ONLY CRITICAL RESULT CALLED TO, READ BACK BY AND VERIFIED WITH: [place], PHARMD AT 7169 ON 11/06/17 BY C. JESSUP, MLT.    Culture (A)  Final    STAPHYLOCOCCUS SPECIES (COAGULASE NEGATIVE) THE SIGNIFICANCE OF ISOLATING THIS ORGANISM FROM A SINGLE SET OF BLOOD CULTURES WHEN MULTIPLE SETS ARE DRAWN IS UNCERTAIN. PLEASE NOTIFY THE MICROBIOLOGY DEPARTMENT WITHIN ONE WEEK IF SPECIATION AND SENSITIVITIES ARE REQUIRED. Performed at [facility], [place] [address] [place], [place] [address]    Report Status 11/08/2017 FINAL  Final  Culture, blood (routine x 2)     Status: None (Preliminary result)   Collection Time: 11/05/17  9:32 PM  Result Value Ref Range Status   Specimen Description BLOOD LEFT FOREARM  Final   Special Requests   Final    BOTTLES DRAWN AEROBIC AND ANAEROBIC Blood Culture adequate volume   Culture   Final    NO GROWTH 3 DAYS Performed at [facility], [place] [address], [place], [place] [address]  Report Status PENDING  Incomplete  Blood Culture ID Panel (Reflexed)     Status: Abnormal   Collection Time: 11/05/17  9:32 PM  Result Value Ref Range Status   Enterococcus species NOT DETECTED NOT DETECTED Final   Listeria monocytogenes NOT DETECTED NOT DETECTED Final   Staphylococcus species DETECTED (A) NOT DETECTED Final    Comment: Methicillin (oxacillin) susceptible coagulase negative staphylococcus. Possible blood culture contaminant (unless isolated from more than one blood culture draw or clinical case suggests pathogenicity). No antibiotic treatment is indicated for blood  culture contaminants. CRITICAL RESULT CALLED TO, READ BACK BY AND VERIFIED WITH: [place], PHARMD AT 2774 ON 11/06/17 BY C. JESSUP, MLT.    Staphylococcus aureus NOT DETECTED NOT DETECTED Final   Methicillin resistance NOT DETECTED NOT DETECTED Final   Streptococcus species NOT DETECTED NOT DETECTED Final   Streptococcus agalactiae NOT DETECTED NOT DETECTED Final   Streptococcus pneumoniae  NOT DETECTED NOT DETECTED Final   Streptococcus pyogenes NOT DETECTED NOT DETECTED Final   Acinetobacter baumannii NOT DETECTED NOT DETECTED Final   Enterobacteriaceae species NOT DETECTED NOT DETECTED Final   Enterobacter cloacae complex NOT DETECTED NOT DETECTED Final   Escherichia coli NOT DETECTED NOT DETECTED Final   Klebsiella oxytoca NOT DETECTED NOT DETECTED Final   Klebsiella pneumoniae NOT DETECTED NOT DETECTED Final   Proteus species NOT DETECTED NOT DETECTED Final   Serratia marcescens NOT DETECTED NOT DETECTED Final   Haemophilus influenzae NOT DETECTED NOT DETECTED Final   Neisseria meningitidis NOT DETECTED NOT DETECTED Final   Pseudomonas aeruginosa NOT DETECTED NOT DETECTED Final   Candida albicans NOT DETECTED NOT DETECTED Final   Candida glabrata NOT DETECTED NOT DETECTED Final   Candida krusei NOT DETECTED NOT DETECTED Final   Candida parapsilosis NOT DETECTED NOT DETECTED Final   Candida tropicalis NOT DETECTED NOT DETECTED Final    Comment: Performed at [facility], [address], [place], [place] [address]  MRSA PCR Screening     Status: None   Collection Time: 11/06/17 12:57 AM  Result Value Ref Range Status   MRSA by PCR NEGATIVE NEGATIVE Final    Comment:        The GeneXpert MRSA Assay (FDA approved for NASAL specimens only), is one component of a comprehensive MRSA colonization surveillance program. It is not intended to diagnose MRSA infection nor to guide or monitor treatment for MRSA infections. Performed at [facility], [place] [address] [place], [place] [address]   Culture, blood (routine x 2)     Status: None (Preliminary result)   Collection Time: 11/07/17  9:35 AM  Result Value Ref Range Status   Specimen Description BLOOD LEFT ANTECUBITAL  Final   Special Requests   Final    BOTTLES DRAWN AEROBIC ONLY Blood Culture results may not be optimal due to an inadequate volume of blood received in culture bottles   Culture   Final     NO GROWTH 1 DAY Performed at [facility], [place] [address] [place], [place] [address]    Report Status PENDING  Incomplete  Culture, blood (routine x 2)     Status: None (Preliminary result)   Collection Time: 11/07/17  9:40 AM  Result Value Ref Range Status   Specimen Description BLOOD LEFT ANTECUBITAL  Final   Special Requests   Final    BOTTLES DRAWN AEROBIC ONLY Blood Culture results may not be optimal due to an inadequate volume of blood received in culture  bottles   Culture   Final    NO GROWTH 1 DAY Performed at [facility], [place] [address] [place], [place] [address]    Report Status PENDING  Incomplete     Time coordinating discharge: 40   SIGNED:   Cristy Folks, MD  [facility] 11/08/2017, 2:01 PM  If 7PM-7AM, please contact night-coverage [email or web address] Password [email or web address]

## 2017-11-08 NOTE — Clinical Social Work Note (Signed)
Spoke with RNCM. Plan is for patient to discharge home today and will have daily caregiver support to assist with diabetes management.   CSW signing off.   [place] Scrape, [place]

## 2017-11-08 NOTE — Progress Notes (Signed)
Placed patient on CPAP via nasal mask, 10.0 cm H20 with 2 lpm O2 bleed in. Tolerating well at this time. RN aware.

## 2017-11-08 NOTE — Plan of Care (Signed)
  Problem: Education: Goal: Knowledge of General Education information will improve Description Including pain rating scale, medication(s)/side effects and non-pharmacologic comfort measures Outcome: Adequate for Discharge   Problem: Health Behavior/Discharge Planning: Goal: Ability to manage health-related needs will improve Outcome: Adequate for Discharge   Problem: Clinical Measurements: Goal: Ability to maintain clinical measurements within normal limits will improve Outcome: Adequate for Discharge Goal: Will remain free from infection Outcome: Adequate for Discharge Goal: Diagnostic test results will improve Outcome: Adequate for Discharge Goal: Respiratory complications will improve Outcome: Adequate for Discharge Goal: Cardiovascular complication will be avoided Outcome: Adequate for Discharge   Problem: Nutrition: Goal: Adequate nutrition will be maintained Outcome: Adequate for Discharge   Problem: Coping: Goal: Level of anxiety will decrease Outcome: Adequate for Discharge   Problem: Skin Integrity: Goal: Risk for impaired skin integrity will decrease Outcome: Adequate for Discharge   Problem: Education: Goal: Ability to describe self-care measures that may prevent or decrease complications (Diabetes Survival Skills Education) will improve Outcome: Adequate for Discharge Goal: Individualized Educational Video(s) Outcome: Adequate for Discharge   Problem: Coping: Goal: Ability to adjust to condition or change in health will improve Outcome: Adequate for Discharge   Problem: Fluid Volume: Goal: Ability to maintain a balanced intake and output will improve Outcome: Adequate for Discharge   Problem: Health Behavior/Discharge Planning: Goal: Ability to identify and utilize available resources and services will improve Outcome: Adequate for Discharge Goal: Ability to manage health-related needs will improve Outcome: Adequate for Discharge   Problem:  Metabolic: Goal: Ability to maintain appropriate glucose levels will improve Outcome: Adequate for Discharge   Problem: Nutritional: Goal: Maintenance of adequate nutrition will improve Outcome: Adequate for Discharge Goal: Progress toward achieving an optimal weight will improve Outcome: Adequate for Discharge   Problem: Skin Integrity: Goal: Risk for impaired skin integrity will decrease Outcome: Adequate for Discharge   Problem: Tissue Perfusion: Goal: Adequacy of tissue perfusion will improve Outcome: Adequate for Discharge

## 2017-11-09 ENCOUNTER — Telehealth: Payer: Self-pay

## 2017-11-09 ENCOUNTER — Ambulatory Visit (HOSPITAL_COMMUNITY): Payer: Self-pay | Admitting: Psychiatry

## 2017-11-09 IMAGING — CR DG CHEST 2V
2 series · 2 of 2 positions shown · non-contrast
Comparison: None.

CLINICAL DATA: 50-year-old male with cough.

EXAM:
CHEST  2 VIEW

[w chest pa]
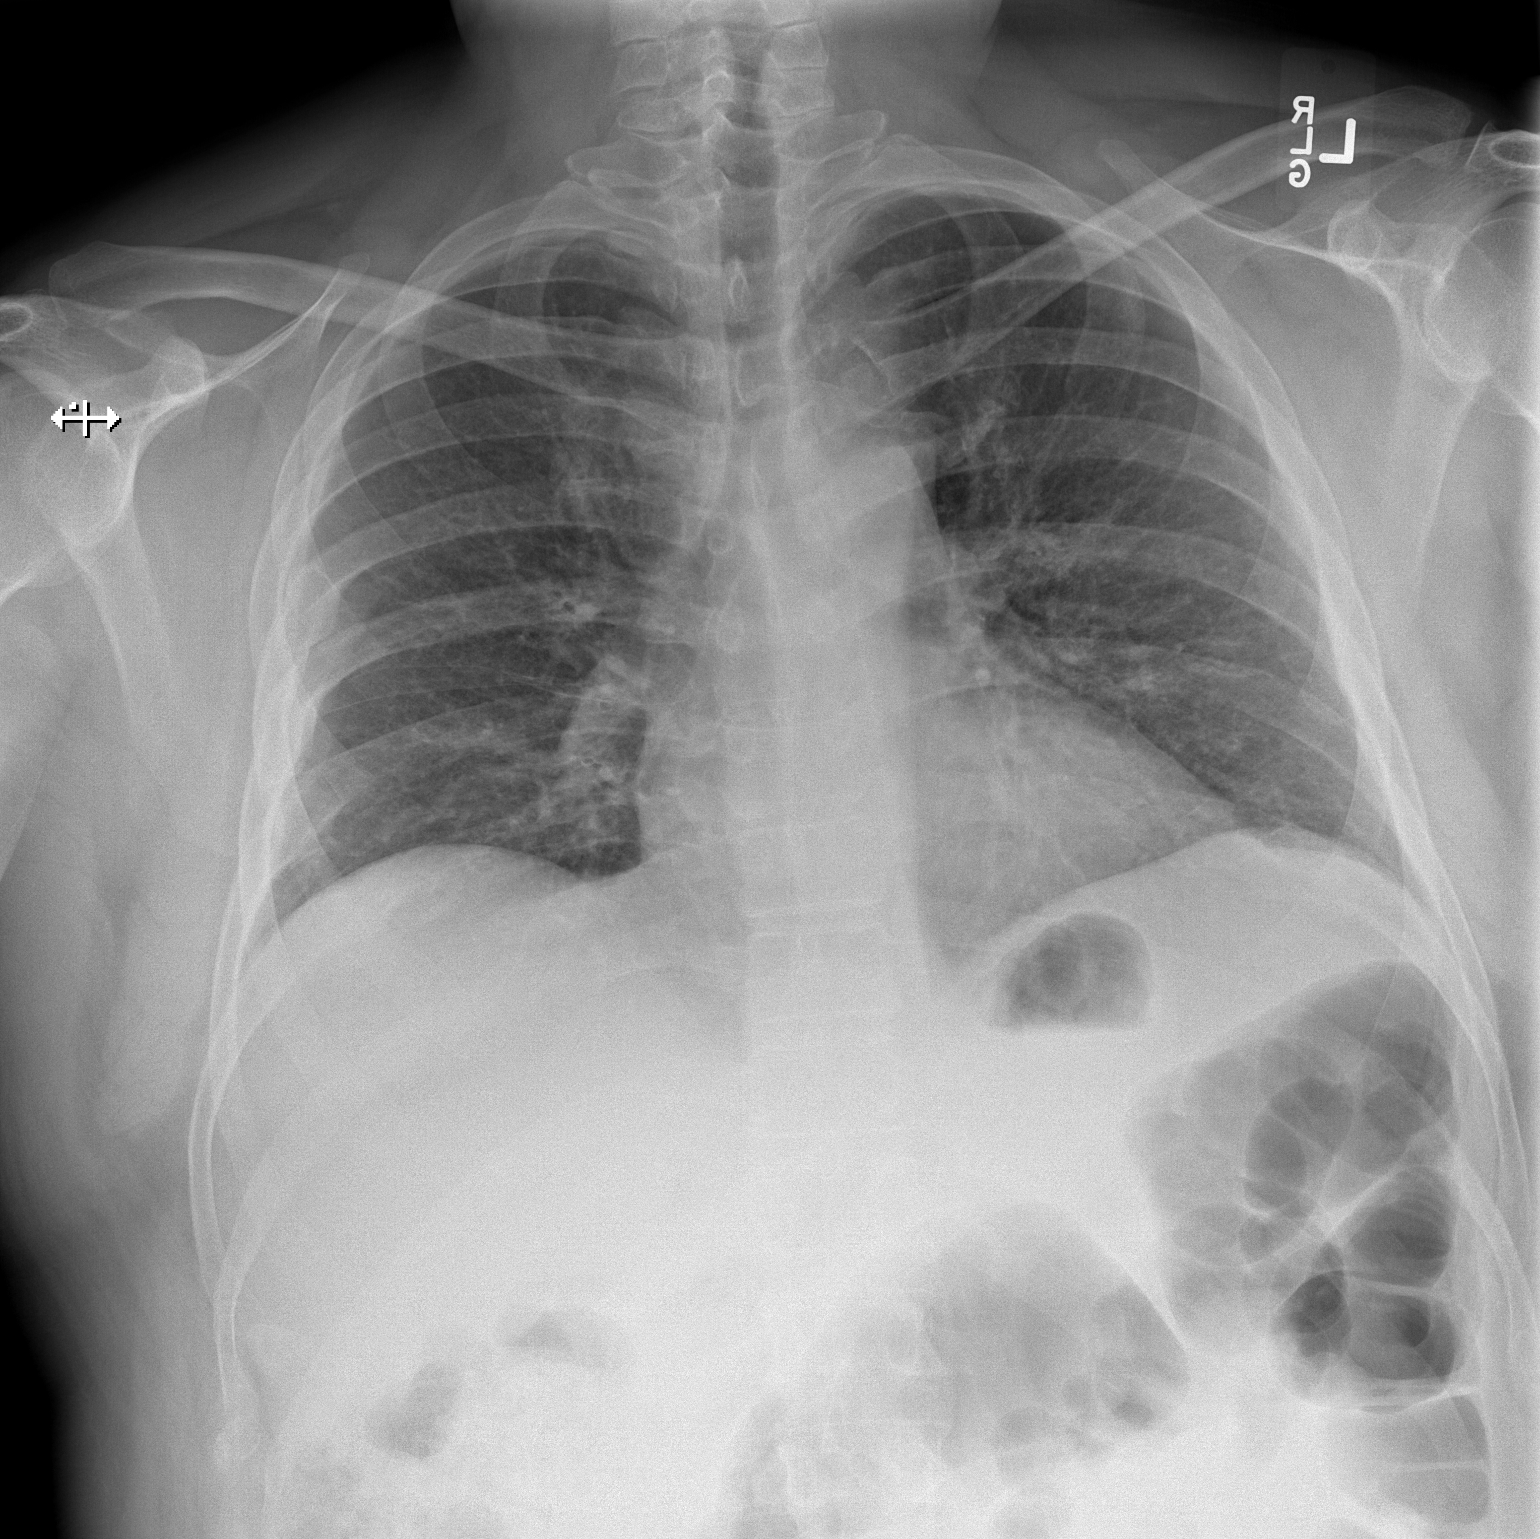

[w chest lat]
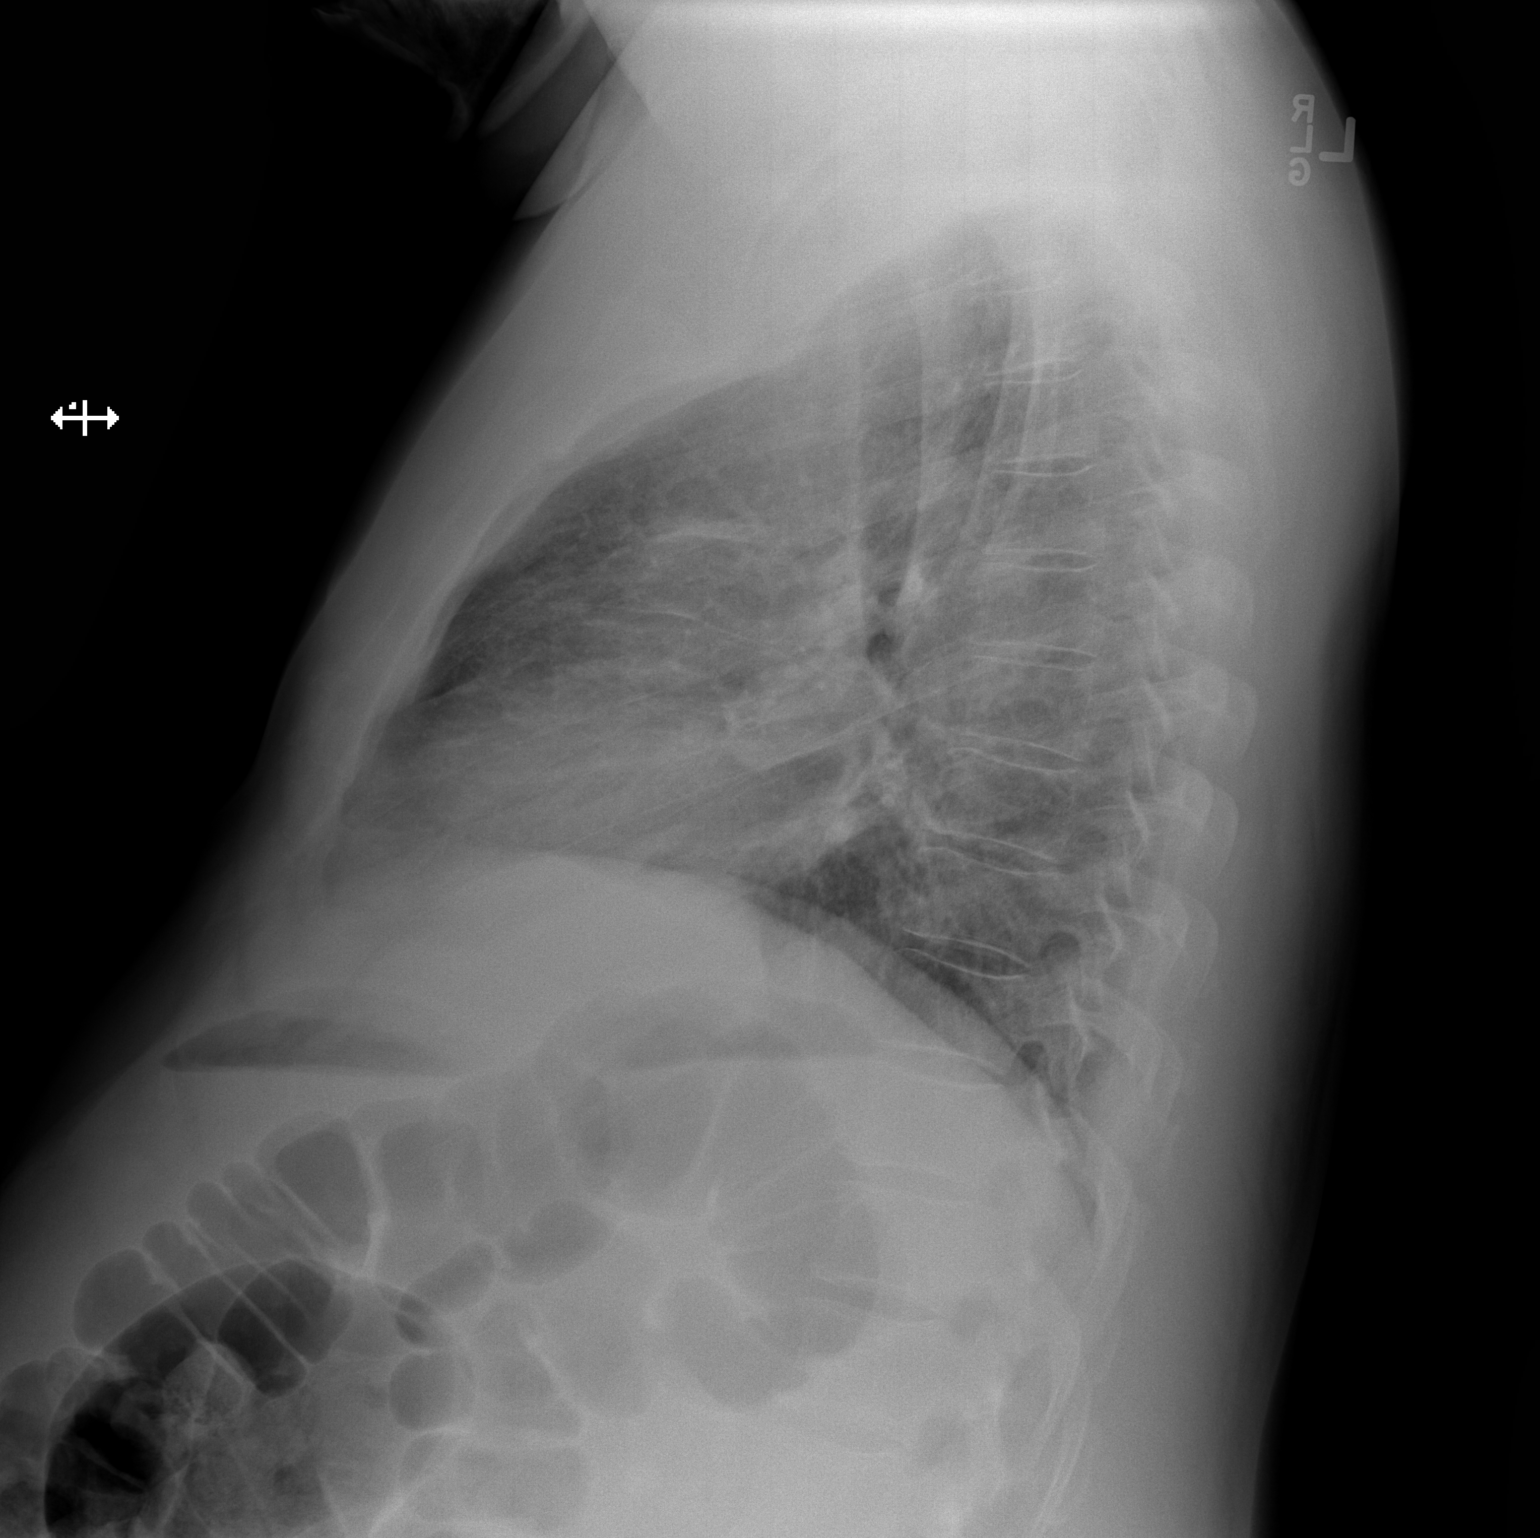

[2 of 2 positions shown; findings below may reference images not displayed]

FINDINGS: The lungs are clear. There is no pleural effusion or pneumothorax.
The cardiac silhouette is within normal limits. Old healed right rib
fractures noted. No acute osseous pathology.
IMPRESSION: No active cardiopulmonary disease.

## 2017-11-09 NOTE — Telephone Encounter (Signed)
11/09/17  Transition Care Management Follow-up Telephone Call  ADMISSION DATE: 11/05/17  DISCHARGE DATE: 11/08/17  PATIENT NEEDS TO HAVE BMP AND CBC WITH REAPEAT A1C in 3 months.  How have you been since you were released from the hospital? Feeling good per patient.   Do you understand why you were in the hospital?  Yes  Do you understand the discharge instrcutions? Yes    Items Reviewed:  Medications reviewed:  Yes  Allergies reviewed: Yes NKDA  Dietary changes reviewed: Diabetic per patient  Referrals reviewed: Appointment scheduled with provider as ordered in discharge summary.  Functional Questionnaire:  Activities of Daily Living (ADLs): Per patient he can perform all independently.  Any patient concerns? Not at this time.   Confirmed importance and date/time of follow-up visits scheduled:Yes  Confirmed with patient if condition begins to worsen call PCP or go to the ER. Yes    Patient was given the office number and encouragred to call back with questions or concerns. Yes

## 2017-11-10 LAB — CULTURE, BLOOD (ROUTINE X 2)
Culture: NO GROWTH
SPECIAL REQUESTS: ADEQUATE

## 2017-11-11 LAB — INSULIN ANTIBODIES, BLOOD: Insulin Antibodies, Human: <5 uU/mL

## 2017-11-12 LAB — CULTURE, BLOOD (ROUTINE X 2)
Culture: NO GROWTH
Culture: NO GROWTH

## 2017-11-15 ENCOUNTER — Emergency Department (HOSPITAL_COMMUNITY)
Admission: EM | Admit: 2017-11-15 | Discharge: 2017-11-16 | Disposition: A | Discharge status: Home / Self Care | Payer: Medicare HMO | Attending: Emergency Medicine | Admitting: Emergency Medicine

## 2017-11-15 ENCOUNTER — Ambulatory Visit (HOSPITAL_COMMUNITY)
Admission: RE | Admit: 2017-11-15 | Discharge: 2017-11-15 | Disposition: A | Discharge status: Home / Self Care | Payer: Medicare HMO | Source: Home / Self Care | Attending: Psychiatry | Admitting: Psychiatry

## 2017-11-15 ENCOUNTER — Encounter (HOSPITAL_COMMUNITY): Payer: Self-pay | Admitting: Emergency Medicine

## 2017-11-15 ENCOUNTER — Other Ambulatory Visit: Payer: Self-pay

## 2017-11-15 DIAGNOSIS — F29 Unspecified psychosis not due to a substance or known physiological condition: Secondary | ICD-10-CM

## 2017-11-15 DIAGNOSIS — I129 Hypertensive chronic kidney disease with stage 1 through stage 4 chronic kidney disease, or unspecified chronic kidney disease: Secondary | ICD-10-CM | POA: Insufficient documentation

## 2017-11-15 DIAGNOSIS — Z7982 Long term (current) use of aspirin: Secondary | ICD-10-CM | POA: Diagnosis not present

## 2017-11-15 DIAGNOSIS — Z794 Long term (current) use of insulin: Secondary | ICD-10-CM | POA: Diagnosis not present

## 2017-11-15 DIAGNOSIS — N183 Chronic kidney disease, stage 3 (moderate): Secondary | ICD-10-CM | POA: Insufficient documentation

## 2017-11-15 DIAGNOSIS — Z79899 Other long term (current) drug therapy: Secondary | ICD-10-CM | POA: Diagnosis not present

## 2017-11-15 DIAGNOSIS — E1122 Type 2 diabetes mellitus with diabetic chronic kidney disease: Secondary | ICD-10-CM | POA: Diagnosis not present

## 2017-11-15 DIAGNOSIS — E1110 Type 2 diabetes mellitus with ketoacidosis without coma: Secondary | ICD-10-CM | POA: Diagnosis not present

## 2017-11-15 DIAGNOSIS — F17290 Nicotine dependence, other tobacco product, uncomplicated: Secondary | ICD-10-CM | POA: Insufficient documentation

## 2017-11-15 DIAGNOSIS — E162 Hypoglycemia, unspecified: Secondary | ICD-10-CM | POA: Diagnosis not present

## 2017-11-15 DIAGNOSIS — F209 Schizophrenia, unspecified: Secondary | ICD-10-CM | POA: Diagnosis not present

## 2017-11-15 LAB — RAPID URINE DRUG SCREEN, HOSP PERFORMED
Amphetamines: NOT DETECTED
BARBITURATES: NOT DETECTED
BENZODIAZEPINES: NOT DETECTED
COCAINE: NOT DETECTED
OPIATES: NOT DETECTED
Tetrahydrocannabinol: NOT DETECTED

## 2017-11-15 LAB — I-STAT CHEM 8, ED
BUN: 13 mg/dL (ref 6–20)
CREATININE: 1.6 mg/dL — AB (ref 0.61–1.24)
Calcium, Ion: 1.25 mmol/L (ref 1.15–1.40)
Chloride: 103 mmol/L (ref 98–111)
Glucose, Bld: 120 mg/dL — ABNORMAL HIGH (ref 70–99)
HEMATOCRIT: 45 % (ref 39.0–52.0)
HEMOGLOBIN: 15.3 g/dL (ref 13.0–17.0)
Potassium: 3.4 mmol/L — ABNORMAL LOW (ref 3.5–5.1)
SODIUM: 135 mmol/L (ref 135–145)
TCO2: 20 mmol/L — AB (ref 22–32)

## 2017-11-15 LAB — CBG MONITORING, ED
GLUCOSE-CAPILLARY: 68 mg/dL — AB (ref 70–99)
Glucose-Capillary: 159 mg/dL — ABNORMAL HIGH (ref 70–99)
Glucose-Capillary: 132 mg/dL — ABNORMAL HIGH (ref 70–99)
Glucose-Capillary: 100 mg/dL — ABNORMAL HIGH (ref 70–99)

## 2017-11-15 LAB — CBC WITH DIFFERENTIAL/PLATELET
BASOS ABS: 0 10*3/uL (ref 0.0–0.1)
Basophils Relative: 0 %
Eosinophils Absolute: 0.1 10*3/uL (ref 0.0–0.7)
Eosinophils Relative: 1 %
HEMATOCRIT: 41.8 % (ref 39.0–52.0)
HEMOGLOBIN: 14.7 g/dL (ref 13.0–17.0)
Lymphocytes Relative: 18 %
Lymphs Abs: 1.7 10*3/uL (ref 0.7–4.0)
MCH: 30.4 pg (ref 26.0–34.0)
MCHC: 35.2 g/dL (ref 30.0–36.0)
MCV: 86.5 fL (ref 78.0–100.0)
MONO ABS: 1 10*3/uL (ref 0.1–1.0)
Monocytes Relative: 10 %
NEUTROS ABS: 7 10*3/uL (ref 1.7–7.7)
NEUTROS PCT: 71 %
Platelets: 511 10*3/uL — ABNORMAL HIGH (ref 150–400)
RBC: 4.83 MIL/uL (ref 4.22–5.81)
RDW: 13.5 % (ref 11.5–15.5)
WBC: 10 10*3/uL (ref 4.0–10.5)

## 2017-11-15 LAB — ETHANOL: Alcohol, Ethyl (B): <10 mg/dL (ref <10)

## 2017-11-15 MED ORDER — ASPIRIN EC 81 MG PO TBEC
81.0000 mg | DELAYED_RELEASE_TABLET | Freq: Every day | ORAL | Status: DC
  Administered 2017-11-15 – 2017-11-16 (×2): 81 mg via ORAL
  Filled 2017-11-15 (×2): qty 1

## 2017-11-15 MED ORDER — MOMETASONE FURO-FORMOTEROL FUM 200-5 MCG/ACT IN AERO
2.0000 | INHALATION_SPRAY | Freq: Two times a day (BID) | RESPIRATORY_TRACT | Status: DC
  Administered 2017-11-16: 2 via RESPIRATORY_TRACT
  Filled 2017-11-15: qty 8.8

## 2017-11-15 MED ORDER — VITAMIN D (ERGOCALCIFEROL) 1.25 MG (50000 UNIT) PO CAPS
50000.0000 [IU] | ORAL_CAPSULE | ORAL | Status: DC
  Administered 2017-11-16: 50000 [IU] via ORAL
  Filled 2017-11-15: qty 1

## 2017-11-15 MED ORDER — TRAZODONE HCL 100 MG PO TABS
100.0000 mg | ORAL_TABLET | Freq: Every day | ORAL | Status: DC
  Administered 2017-11-15: 100 mg via ORAL
  Filled 2017-11-15: qty 1

## 2017-11-15 MED ORDER — INSULIN GLARGINE 100 UNIT/ML ~~LOC~~ SOLN
60.0000 [IU] | Freq: Every day | SUBCUTANEOUS | Status: DC
  Administered 2017-11-16: 60 [IU] via SUBCUTANEOUS
  Filled 2017-11-15: qty 0.6

## 2017-11-15 MED ORDER — RISPERIDONE 2 MG PO TABS: 2.0000 mg | ORAL_TABLET | ORAL | Status: DC

## 2017-11-15 MED ORDER — METOPROLOL TARTRATE 25 MG PO TABS
25.0000 mg | ORAL_TABLET | Freq: Two times a day (BID) | ORAL | Status: DC
  Administered 2017-11-15 – 2017-11-16 (×2): 25 mg via ORAL
  Filled 2017-11-15 (×2): qty 1

## 2017-11-15 NOTE — ED Notes (Signed)
Per Ria Comment at [facility] he is psychotic-was recently discharged for DKA-no beds at this time

## 2017-11-15 NOTE — BHH Counselor (Signed)
Patient present as a walk-in to [facility] accompanied by his mother with complaints of bizarre behavior and auditory hallucinations. Patient denies SI/HI and AVH.   Shuvon Rankin, NP, recommends inpatient hospitalization. Per Angelina Ok, [facility], there are no appropriate beds as [facility]. Patient was transported to [place]. Angelina Ok, [facility], gave report to Erline Levine, Therapist, sports, Camera operator.

## 2017-11-15 NOTE — ED Notes (Signed)
Pt given juice and crackers.

## 2017-11-15 NOTE — ED Notes (Signed)
Bed: WLPT4 Expected date:  Expected time:  Means of arrival:  Comments: 

## 2017-11-15 NOTE — ED Notes (Signed)
Bed: WBH37 Expected date:  Expected time:  Means of arrival:  Comments: 

## 2017-11-15 NOTE — H&P (Signed)
Behavioral Health Medical Screening Exam  [name] [name]. is an 52 y.o. male patient presents as walk in at [facility]; brought in by his mother with complaints of bizarre behavior and auditory hallucinations.  Patient does appear to be delusional with statement that patient that brought him in is not his mother but rather he has been kidnapped; patient unable to answer any questions directly but answering with something not pertaining to question that was asked.  Total Time spent with patient: 30 minutes  Psychiatric Specialty Exam: Physical Exam  Vitals reviewed. Constitutional: He appears well-developed and well-nourished.  Obese     Neck: Normal range of motion.  Musculoskeletal: Normal range of motion.  Neurological: He is alert.  Skin: Skin is warm and dry.  Psychiatric: His mood appears anxious. He is actively hallucinating. Thought content is paranoid and delusional.    Review of Systems  Psychiatric/Behavioral: Hallucinations:  "sometimes; don't know other can hear them or not" Substance abuse: Would not give an answer. Suicidal ideas: Denies.       Patient reports "This is not my mother; she kidnapped me and is trying to run up my bill.  Let me show you my ID."    Blood pressure 126/71, pulse (!) 102, temperature 98.7 F (37.1 C), resp. rate 18, SpO2 98 %.There is no height or weight on file to calculate BMI.  General Appearance: Disheveled  Eye Contact:  Good  Speech:  Garbled  Volume:  Decreased  Mood:  Anxious  Affect:  Inappropriate  Thought Process:  Disorganized  Orientation:  Other:  Oriented to self and that he is in a hospital but not sure where  Thought Content:  Hallucinations: Auditory, Paranoid Ideation and Tangential  Suicidal Thoughts:  No  Homicidal Thoughts:  No  Memory:  Immediate;   Poor Recent;   Poor Remote;   Poor  Judgement:  Impaired  Insight:  Lacking  Psychomotor Activity:  Normal  Concentration: Concentration: Poor and Attention  Span: Poor  Recall:  Poor  Fund of Knowledge:Fair  Language: Good  Akathisia:  No  Handed:  Right  AIMS (if indicated):     Assets:  Desire for Improvement  Sleep:       Musculoskeletal: Strength & Muscle Tone: within normal limits Gait & Station: normal Patient leans: N/A  Blood pressure 126/71, pulse (!) 102, temperature 98.7 F (37.1 C), resp. rate 18, SpO2 98 %.  Recommendations:  Inpatient psychiatric treatment  Based on my evaluation the patient appears to have an emergency medical condition for which I recommend the patient be transferred to the emergency department for further evaluation.  Shuvon Rankin, NP 11/15/2017, 3:28 PM

## 2017-11-15 NOTE — ED Triage Notes (Signed)
Pt brought over from [facility] by [place]. Pt reports he hasnt eaten today and his blood sugar is low. Pt denies SI/HI, n/v/d.

## 2017-11-15 NOTE — ED Provider Notes (Signed)
[place] DEPT Provider Note   CSN: [id number] Arrival date & time: 11/15/17  1548     History   Chief Complaint Chief Complaint  Patient presents with  . Hypoglycemia    HPI [name] [name]. is a 52 y.o. male.  Patient is a 52 year old male with a history of schizophrenia, hypertension, hyperlipidemia and diabetes.  He is presenting today from behavioral health after he was a walk-in with his mother for exhibiting bizarre behavior and auditory hallucinations.  Unclear how long the symptoms of been going on and patient is not forthcoming with information.  Based on the note from behavioral health patient is exhibiting bizarre behavior, tangential thinking and is guarded.  Mother did not think he was taking medications but patient states he is.  After evaluation with behavioral health they felt that patient required admission but there was no suitable beds for him so they sent him over here.  Upon arrival here patient was found to be hypoglycemic with a blood sugar of 68.  He denies feeling badly at all and states he did take his medications this morning.  He denies any chest pain, shortness of breath, headache, abdominal pain, nausea or vomiting.  He has been eating normally.  The history is provided by the patient and medical records.  Hypoglycemia    Past Medical History:  Diagnosis Date  . Diabetes mellitus type II, uncontrolled ([place])   . HTN (hypertension)   . Hyperlipidemia LDL goal <70   . Schizophrenia [facility])     Patient Active Problem List   Diagnosis Date Noted  . DKA (diabetic ketoacidoses) ([place]) 11/05/2017  . Acute renal failure superimposed on stage 3 chronic kidney disease ([place]) 11/05/2017  . Type II diabetes mellitus with renal manifestations ([place]) 11/05/2017  . Depression 11/05/2017  . Leukocytosis 11/05/2017  . Cough 11/05/2017  . Tobacco abuse 11/05/2017  . Schizoaffective disorder, bipolar type ([place]) 06/10/2016  . DM  (diabetes mellitus) type II uncontrolled, periph vascular disorder ([place]) 02/29/2016  . Schizophrenia, disorganized type ([place]) 06/02/2015  . Hyperlipidemia 09/23/2014  . Renal insufficiency 09/23/2014  . HTN (hypertension) 03/22/2013  . Seasonal allergies 03/22/2013    History reviewed. No pertinent surgical history.      Home Medications    Prior to Admission medications   Medication Sig Start Date End Date Taking? Authorizing Provider  ALLERGY RELIEF 10 MG tablet TAKE 1 TABLET BY MOUTH EVERY DAY Patient taking differently: Take 10 mg by mouth daily.  08/23/16   Ann Held, DO  aspirin EC 81 MG tablet Take 1 tablet (81 mg total) by mouth daily. 01/21/16   Debbrah Alar, NP  blood glucose meter kit and supplies KIT Dispense based on patient and insurance preference. Use up to four times daily as directed. (FOR ICD-9 250.00, 250.01). 11/08/17   Purohit, Konrad Dolores, MD  Fluticasone-Salmeterol (ADVAIR DISKUS) 250-50 MCG/DOSE AEPB Inhale 1 puff into the lungs 2 (two) times daily. 09/30/17   Roma Schanz R, DO  insulin glargine (LANTUS) 100 UNIT/ML injection Inject 0.6 mLs (60 Units total) into the skin daily. 11/09/17   Purohit, Konrad Dolores, MD  Insulin Pen Needle 32G X 6 MM MISC 1 Units by Does not apply route every morning. 11/08/17   Purohit, Konrad Dolores, MD  metoprolol tartrate (LOPRESSOR) 25 MG tablet TAKE 1 TABLET BY MOUTH TWICE A DAY Patient taking differently: Take 25 mg by mouth 2 (two) times daily.  09/16/17   Ann Held,  DO  mirtazapine (REMERON) 30 MG tablet Take 1 tablet (30 mg total) by mouth at bedtime. 08/09/17   Arfeen, Arlyce Harman, MD  repaglinide (PRANDIN) 2 MG tablet Take 1 tablet (2 mg total) by mouth 3 (three) times daily before meals. 11/08/17 12/08/17  Purohit, Konrad Dolores, MD  risperiDONE (RISPERDAL) 2 MG tablet TAKE 1 TABLET IN THE MORNING AND TAKE 2 TABLETS AT BEDTIME Patient taking differently: Take 2-4 mg by mouth See admin instructions. TAKE 1 TABLET IN THE  MORNING AND TAKE 2 TABLETS AT BEDTIME 08/09/17   Arfeen, Arlyce Harman, MD  simvastatin (ZOCOR) 40 MG tablet TAKE 1 TABLET BY MOUTH EVERY DAY Patient taking differently: Take 40 mg by mouth daily.  08/29/17   Ann Held, DO  traZODone (DESYREL) 100 MG tablet Take 1 tablet (100 mg total) by mouth at bedtime. 08/09/17   Arfeen, Arlyce Harman, MD  Vitamin D, Ergocalciferol, (DRISDOL) 50000 units CAPS capsule Take 1 capsule once a week by mouth. 11/22/16   [provider]    Family History Family History  Problem Relation Age of Onset  . Breast cancer Mother   . Diabetes Mother   . Prostate cancer Father        StepFather  . Hypertension Father   . Breast cancer Maternal Aunt   . Diabetes Maternal Uncle     Social History Social History   Tobacco Use  . Smoking status: Current Every Day Smoker    Packs/day: 2.00    Years: 30.00    Pack years: 60.00    Types: Cigarettes, Cigars  . Smokeless tobacco: Never Used  Substance Use Topics  . Alcohol use: Yes  . Drug use: Yes    Types: Marijuana     Allergies   Patient has no known allergies.   Review of Systems Review of Systems  All other systems reviewed and are negative.    Physical Exam Updated Vital Signs BP 110/81 (BP Location: Left Arm)   Pulse 96   Temp 98.7 F (37.1 C) (Oral)   Resp 17   SpO2 94%   Physical Exam  Constitutional: He is oriented to person, place, and time. He appears well-developed and well-nourished. No distress.  HENT:  Head: Normocephalic and atraumatic.  Mouth/Throat: Oropharynx is clear and moist.  Eyes: Pupils are equal, round, and reactive to light. Conjunctivae and EOM are normal.  Neck: Normal range of motion. Neck supple.  Cardiovascular: Normal rate, regular rhythm and intact distal pulses.  No murmur heard. Pulmonary/Chest: Effort normal and breath sounds normal. No respiratory distress. He has no wheezes. He has no rales.  Abdominal: Soft. He exhibits no distension. There is  no tenderness. There is no rebound and no guarding.  Musculoskeletal: Normal range of motion. He exhibits no edema or tenderness.  Neurological: He is alert and oriented to person, place, and time.  Skin: Skin is warm and dry. No rash noted. No erythema.  Psychiatric: He has a normal mood and affect. His speech is tangential. He expresses no homicidal and no suicidal ideation.  Nursing note and vitals reviewed.    ED Treatments / Results  Labs (all labs ordered are listed, but only abnormal results are displayed) Labs Reviewed  CBC WITH DIFFERENTIAL/PLATELET - Abnormal; Notable for the following components:      Result Value   Platelets 511 (*)    All other components within normal limits  CBG MONITORING, ED - Abnormal; Notable for the following components:   Glucose-Capillary  68 (*)    All other components within normal limits  I-STAT CHEM 8, ED - Abnormal; Notable for the following components:   Potassium 3.4 (*)    Creatinine, Ser 1.60 (*)    Glucose, Bld 120 (*)    TCO2 20 (*)    All other components within normal limits  ETHANOL  RAPID URINE DRUG SCREEN, HOSP PERFORMED    EKG None  Radiology No results found.  Procedures Procedures (including critical care time)  Medications Ordered in ED Medications - No data to display   Initial Impression / Assessment and Plan / ED Course  I have reviewed the triage vital signs and the nursing notes.  Pertinent labs & imaging results that were available during my care of the patient were reviewed by me and considered in my medical decision making (see chart for details).     Patient presenting here today for medical clearance as he waits for appropriate bed availability for inpatient psychiatric admission.  Patient does have a history of diabetes.  He is not able to tell me what he takes but blood sugar was 68 here.  Patient denies feeling poorly and has no complaints at this time.  Patient was given something to eat and will  repeat his blood sugar.  Medical clearance labs are pending will ensure normal renal function.  5:59 PM Labs and VS wnl.  Pt's repeat Blood sugar is 120 after eating.  Pt cleared for psych.  Final Clinical Impressions(s) / ED Diagnoses   Final diagnoses:  Psychosis, unspecified psychosis type [facility])  Hypoglycemia    ED Discharge Orders    None       Blanchie Dessert, MD 11/15/17 1759

## 2017-11-15 NOTE — ED Notes (Signed)
Bed: WLPT3 Expected date:  Expected time:  Means of arrival:  Comments: 

## 2017-11-15 NOTE — BH Assessment (Signed)
Assessment Note  [name] [name]. is an 52 y.o. male present to [facility] as a walk-in accompanied by his mother with complaints of bizarre behavior and auditory hallucinations. Per report from patient's mother she feels patient is having a psychotic break. Patient is a poor historian and speaks in a tangential language and appears to be delusional. Patient also displayed thought blocking. Patient denies suicidal / homicidal ideations and denies experiencing auditory / visual hallucinations. Patient report he taking his medication as prescribed but patient's mother feels patient is not taking his medication. Patient was last seen in the ED 08/09/2017 for bizarre behavior and last seen for an assessment 3.28.2019. Patient psychiatrist is Dr. Adele Schilder.   Diagnosis: F20.9  Schizophrenia  Past Medical History:  Past Medical History:  Diagnosis Date  . Diabetes mellitus type II, uncontrolled ([place])   . HTN (hypertension)   . Hyperlipidemia LDL goal <70   . Schizophrenia ([place])     No past surgical history on file.  Family History:  Family History  Problem Relation Age of Onset  . Breast cancer Mother   . Diabetes Mother   . Prostate cancer Father        StepFather  . Hypertension Father   . Breast cancer Maternal Aunt   . Diabetes Maternal Uncle     Social History:  reports that he has been smoking cigarettes and cigars. He has a 60.00 pack-year smoking history. He has never used smokeless tobacco. He reports that he has current or past drug history. Drug: Marijuana. He reports that he does not drink alcohol.  Additional Social History:  Alcohol / Drug Use Pain Medications: see MAR Prescriptions: see MAR Over the Counter: see MAR History of alcohol / drug use?: Yes Substance #1 Name of Substance 1: THC 1 - Age of First Use: unknown 1 - Amount (size/oz): unknown 1 - Frequency: unknown 1 - Duration: unknown 1 - Last Use / Amount: 5 months   CIWA: CIWA-Ar BP: 126/71 Pulse Rate: (!)  102 COWS:    Allergies: No Known Allergies  Home Medications:  (Not in a hospital admission)  OB/GYN Status:  No LMP for male patient.  General Assessment Data Location of Assessment: [facility] Assessment Services(walk-in) TTS Assessment: In system Is this a Tele or Face-to-Face Assessment?: Face-to-Face Is this an Initial Assessment or a Re-assessment for this encounter?: Initial Assessment Patient Accompanied by:: Parent(patient accompanied by his mother ) Language Other than English: No Living Arrangements: Other (Comment)(patient lives with his mother ) What gender do you identify as?: Male Marital status: Single Maiden name: n/a Living Arrangements: Parent(patient lives with his mother ) Can pt return to current living arrangement?: Yes Admission Status: Voluntary Is patient capable of signing voluntary admission?: Yes Referral Source: Self/Family/Friend Insurance type: [employer] Medicare   Medical Screening Exam [facility] Walk-in ONLY) Medical Exam completed: Yes  Crisis Care Plan Living Arrangements: Parent(patient lives with his mother ) Legal Guardian: Mother Name of Psychiatrist: Dr. Adele Schilder Name of Therapist: none report   Education Status Is patient currently in school?: No Is the patient employed, unemployed or receiving disability?: Unemployed  Risk to self with the past 6 months Suicidal Ideation: No Has patient been a risk to self within the past 6 months prior to admission? : No Suicidal Intent: No Has patient had any suicidal intent within the past 6 months prior to admission? : No Is patient at risk for suicide?: No Suicidal Plan?: No Has patient had any suicidal plan within the past 6  months prior to admission? : No Access to Means: No What has been your use of drugs/alcohol within the last 12 months?: THC(report last use 5 months ) Previous Attempts/Gestures: No How many times?: 0 Other Self Harm Risks: none report  Triggers for Past Attempts: None  known Intentional Self Injurious Behavior: None Family Suicide History: No Recent stressful life event(s): Other (Comment)(per mother patient sugar has me elevated ) Persecutory voices/beliefs?: No Depression: No Depression Symptoms: (none ) Substance abuse history and/or treatment for substance abuse?: Yes Suicide prevention information given to non-admitted patients: Not applicable  Risk to Others within the past 6 months Homicidal Ideation: No Does patient have any lifetime risk of violence toward others beyond the six months prior to admission? : No Thoughts of Harm to Others: No Current Homicidal Intent: No Current Homicidal Plan: No Access to Homicidal Means: No Identified Victim: n/a History of harm to others?: No Assessment of Violence: None Noted Violent Behavior Description: verbal threats  Does patient have access to weapons?: No Criminal Charges Pending?: No Does patient have a court date: No Is patient on probation?: No  Psychosis Hallucinations: None noted Delusions: Unspecified  Mental Status Report Appearance/Hygiene: Unremarkable, Body odor Eye Contact: Poor Motor Activity: Freedom of movement Speech: Tangential, Word salad Level of Consciousness: Alert Mood: Pleasant Affect: Flat, Appropriate to circumstance Anxiety Level: None Thought Processes: Tangential, Thought Blocking Judgement: Impaired Orientation: Person, Time, Place Obsessive Compulsive Thoughts/Behaviors: None  Cognitive Functioning Concentration: Decreased Memory: Unable to Assess Is patient IDD: No Insight: Unable to Assess Impulse Control: Poor Appetite: Fair Have you had any weight changes? : No Change Sleep: Unable to Assess Vegetative Symptoms: None  ADLScreening [facility] Assessment Services) Patient's cognitive ability adequate to safely complete daily activities?: Yes Patient able to express need for assistance with ADLs?: Yes Independently performs ADLs?: Yes (appropriate  for developmental age)  Prior Inpatient Therapy Prior Inpatient Therapy: No(multiple ED visits )  Prior Outpatient Therapy Prior Outpatient Therapy: No Does patient have an ACCT team?: No Does patient have Intensive In-House Services?  : No Does patient have [facility] services? : No Does patient have P4CC services?: No  ADL Screening (condition at time of admission) Patient's cognitive ability adequate to safely complete daily activities?: Yes Is the patient deaf or have difficulty hearing?: No Does the patient have difficulty seeing, even when wearing glasses/contacts?: No Does the patient have difficulty concentrating, remembering, or making decisions?: No Patient able to express need for assistance with ADLs?: Yes Does the patient have difficulty dressing or bathing?: No Independently performs ADLs?: Yes (appropriate for developmental age) Does the patient have difficulty walking or climbing stairs?: No Weakness of Legs: None Weakness of Arms/Hands: None       Abuse/Neglect Assessment (Assessment to be complete while patient is alone) Abuse/Neglect Assessment Can Be Completed: Yes Physical Abuse: Denies Verbal Abuse: Denies Sexual Abuse: Denies Exploitation of patient/patient's resources: Denies Self-Neglect: Denies     Regulatory affairs officer (For Healthcare) Does Patient Have a Medical Advance Directive?: No Does patient want to make changes to medical advance directive?: No - Patient declined Type of Advance Directive: [place] in Chart?: No - copy requested Would patient like information on creating a medical advance directive?: No - Patient declined          Disposition:  Disposition Initial Assessment Completed for this Encounter: Yes(Shuvon Rankin, NP, recommend inpt tx ) Disposition of Patient: Admit(Shuvon Rankin, NP, recommend inpt tx ) Patient refused  recommended treatment: No  On Site Evaluation by:    Reviewed with Physician:    Despina Hidden 11/15/2017 3:47 PM

## 2017-11-15 NOTE — ED Notes (Signed)
Patient reports Homicidal ideation. Patient reports he wants to hurt people but states "Not you". Patient denies plan. Patient denies SI/AVH. Sandwich and soda given. Plan of care discussed. Encouragement and support provided and safety maintain. Q 15 min safety checks in place and video monitoring.

## 2017-11-16 ENCOUNTER — Ambulatory Visit (HOSPITAL_COMMUNITY): Payer: Self-pay | Admitting: Psychiatry

## 2017-11-16 DIAGNOSIS — F209 Schizophrenia, unspecified: Secondary | ICD-10-CM

## 2017-11-16 LAB — CBG MONITORING, ED: Glucose-Capillary: 164 mg/dL — ABNORMAL HIGH (ref 70–99)

## 2017-11-16 NOTE — ED Notes (Signed)
Pt discharged home. Discharged instructions read to pt who verbalized understanding. All belongings returned to pt who signed for same. Denies SI/HI, is not delusional and not responding to internal stimuli. Escorted pt to the ED exit.   Pt's mother, who is his guardian, picked him up with another person present.

## 2017-11-16 NOTE — Consult Note (Signed)
[facility] ED Discharge  11/16/2017 12:44 PM [name] [name].  MRN:  [id number] Principal Problem: <principal problem not specified> Discharge Diagnoses:  Patient Active Problem List   Diagnosis Date Noted  . DKA (diabetic ketoacidoses) ([place]) [E13.10] 11/05/2017  . Acute renal failure superimposed on stage 3 chronic kidney disease ([place]) [N17.9, N18.3] 11/05/2017  . Type II diabetes mellitus with renal manifestations ([place]) [E11.29] 11/05/2017  . Depression [F32.9] 11/05/2017  . Leukocytosis [D72.829] 11/05/2017  . Cough [R05] 11/05/2017  . Tobacco abuse [Z72.0] 11/05/2017  . Schizoaffective disorder, bipolar type ([place]) [F25.0] 06/10/2016  . DM (diabetes mellitus) type II uncontrolled, periph vascular disorder ([place]) [E11.51, E11.65] 02/29/2016  . Schizophrenia, disorganized type ([place]) [F20.1] 06/02/2015  . Hyperlipidemia [E78.5] 09/23/2014  . Renal insufficiency [N28.9] 09/23/2014  . HTN (hypertension) [I10] 03/22/2013  . Seasonal allergies [J30.2] 03/22/2013    Subjective: Pt was seen and chart reviewed with treatment team and Dr Dwyane Dee.  Pt denies suicidal/homicidal ideation, denies auditory/visual hallucinations and does not appear to be responding to internal stimuli. Pt stated he lives with his mother and she manages his money for him. They go into an argument because he wanted his cigarettes and a soda and she said no. Pt was alert & oriented x 4 and was able to answer questions clearly and concisely. Pt is followed by [facility] Outpatient group and has an appointment with Dr Doyne Keel tomorrow at 1130 AM for medication management. Pt's UDS and BAL are negative. Pt remaining lab work is unremarkable. Pt is stable and psychiatrically clear for discharge.   Total Time spent with patient: 45 minutes  Past Psychiatric History: As above  Past Medical History:  Past Medical History:  Diagnosis Date  . Diabetes mellitus type II, uncontrolled ([place])   . HTN (hypertension)   . Hyperlipidemia  LDL goal <70   . Schizophrenia ([place])    History reviewed. No pertinent surgical history. Family History:  Family History  Problem Relation Age of Onset  . Breast cancer Mother   . Diabetes Mother   . Prostate cancer Father        StepFather  . Hypertension Father   . Breast cancer Maternal Aunt   . Diabetes Maternal Uncle    Family Psychiatric  History: Unknown Social History:  Social History   Substance and Sexual Activity  Alcohol Use Yes    Social History   Substance and Sexual Activity  Drug Use Yes  . Types: Marijuana   Social History   Socioeconomic History  . Marital status: Single    Spouse name: Not on file  . Number of children: Not on file  . Years of education: Not on file  . Highest education level: Not on file  Occupational History  . Not on file  Social Needs  . Financial resource strain: Not on file  . Food insecurity:    Worry: Not on file    Inability: Not on file  . Transportation needs:    Medical: Not on file    Non-medical: Not on file  Tobacco Use  . Smoking status: Current Every Day Smoker    Packs/day: 2.00    Years: 30.00    Pack years: 60.00    Types: Cigarettes, Cigars  . Smokeless tobacco: Never Used  Substance and Sexual Activity  . Alcohol use: Yes  . Drug use: Yes    Types: Marijuana  . Sexual activity: Never  Lifestyle  . Physical activity:    Days per week: Not on  file    Minutes per session: Not on file  . Stress: Not on file  Relationships  . Social connections:    Talks on phone: Not on file    Gets together: Not on file    Attends religious service: Not on file    Active member of club or organization: Not on file    Attends meetings of clubs or organizations: Not on file    Relationship status: Not on file  Other Topics Concern  . Not on file  Social History Narrative   Lives with mom   unemployed   No children    Has this patient used any form of tobacco in the last 30 days? (Cigarettes, Smokeless  Tobacco, Cigars, and/or Pipes) Prescription not provided because: Pt declined  Current Medications: Current Facility-Administered Medications  Medication Dose Route Frequency Provider Last Rate Last Dose  . aspirin EC tablet 81 mg  81 mg Oral Daily Blanchie Dessert, MD   81 mg at 11/16/17 1021  . insulin glargine (LANTUS) injection 60 Units  60 Units Subcutaneous Daily Blanchie Dessert, MD   60 Units at 11/16/17 1022  . metoprolol tartrate (LOPRESSOR) tablet 25 mg  25 mg Oral BID Blanchie Dessert, MD   25 mg at 11/16/17 1021  . mometasone-formoterol (DULERA) 200-5 MCG/ACT inhaler 2 puff  2 puff Inhalation BID Blanchie Dessert, MD   2 puff at 11/16/17 1018  . risperiDONE (RISPERDAL) tablet 2-4 mg  2-4 mg Oral See admin instructions Blanchie Dessert, MD      . traZODone (DESYREL) tablet 100 mg  100 mg Oral QHS Blanchie Dessert, MD   100 mg at 11/15/17 2158  . Vitamin D (Ergocalciferol) (DRISDOL) capsule 50,000 Units  50,000 Units Oral Weekly Blanchie Dessert, MD   50,000 Units at 11/16/17 1020   Current Outpatient Medications  Medication Sig Dispense Refill  . aspirin EC 81 MG tablet Take 1 tablet (81 mg total) by mouth daily.    . Fluticasone-Salmeterol (ADVAIR DISKUS) 250-50 MCG/DOSE AEPB Inhale 1 puff into the lungs 2 (two) times daily. 1 each 3  . insulin glargine (LANTUS) 100 UNIT/ML injection Inject 0.6 mLs (60 Units total) into the skin daily. 10 mL 11  . metoprolol tartrate (LOPRESSOR) 25 MG tablet TAKE 1 TABLET BY MOUTH TWICE A DAY (Patient taking differently: Take 25 mg by mouth 2 (two) times daily. ) 180 tablet 1  . mirtazapine (REMERON) 30 MG tablet Take 1 tablet (30 mg total) by mouth at bedtime. 30 tablet 2  . Multiple Vitamin (MULTIVITAMIN WITH MINERALS) TABS tablet Take 1 tablet by mouth daily.    . repaglinide (PRANDIN) 2 MG tablet Take 1 tablet (2 mg total) by mouth 3 (three) times daily before meals. (Patient taking differently: Take 2 mg by mouth 2 (two) times daily  before a meal. ) 90 tablet 1  . risperiDONE (RISPERDAL) 2 MG tablet TAKE 1 TABLET IN THE MORNING AND TAKE 2 TABLETS AT BEDTIME (Patient taking differently: Take 2-4 mg by mouth See admin instructions. TAKE 1 TABLET IN THE MORNING AND TAKE 2 TABLETS AT BEDTIME) 90 tablet 2  . simvastatin (ZOCOR) 40 MG tablet TAKE 1 TABLET BY MOUTH EVERY DAY (Patient taking differently: Take 40 mg by mouth daily. ) 90 tablet 1  . traZODone (DESYREL) 100 MG tablet Take 1 tablet (100 mg total) by mouth at bedtime. 30 tablet 2  . Vitamin D, Ergocalciferol, (DRISDOL) 50000 units CAPS capsule Take 1 capsule once a week by mouth.  2  . ALLERGY RELIEF 10 MG tablet TAKE 1 TABLET BY MOUTH EVERY DAY (Patient not taking: No sig reported) 30 tablet 0  . blood glucose meter kit and supplies KIT Dispense based on patient and insurance preference. Use up to four times daily as directed. (FOR ICD-9 250.00, 250.01). 1 each 0  . Insulin Pen Needle 32G X 6 MM MISC 1 Units by Does not apply route every morning. 90 each 1   PTA Medications:  (Not in a hospital admission)  Musculoskeletal: Strength & Muscle Tone: within normal limits Gait & Station: normal Patient leans: N/A  Psychiatric Specialty Exam: Physical Exam  Constitutional: He is oriented to person, place, and time. He appears well-developed and well-nourished.  HENT:  Head: Normocephalic.  Respiratory: Effort normal.  Musculoskeletal: Normal range of motion.  Neurological: He is alert and oriented to person, place, and time.  Psychiatric: His speech is normal and behavior is normal. Thought content normal. His mood appears anxious. Cognition and memory are normal. He expresses impulsivity. He exhibits a depressed mood.    Review of Systems  Psychiatric/Behavioral: Positive for depression. Negative for hallucinations, memory loss, substance abuse and suicidal ideas. The patient is nervous/anxious. The patient does not have insomnia.   All other systems reviewed and  are negative.   Blood pressure 104/88, pulse (!) 112, temperature 98.4 F (36.9 C), temperature source Oral, resp. rate 18, height '5\' 9"'$  (1.753 m), weight 97.5 kg, SpO2 98 %.Body mass index is 31.75 kg/m.  General Appearance: Disheveled  Eye Contact:  Good  Speech:  Clear and Coherent  Volume:  Normal  Mood:  Anxious and Depressed  Affect:  Congruent and Depressed  Thought Process:  Coherent and Linear  Orientation:  Full (Time, Place, and Person)  Thought Content:  Logical  Suicidal Thoughts:  No  Homicidal Thoughts:  No  Memory:  Immediate;   Good Recent;   Good Remote;   Fair  Judgement:  Fair  Insight:  Fair  Psychomotor Activity:  Normal  Concentration:  Concentration: Good and Attention Span: Good  Recall:  Good  Fund of Knowledge:  Good  Language:  Good  Akathisia:  No  Handed:  Right  AIMS (if indicated):     Assets:  Agricultural consultant Housing Physical Health Social Support  ADL's:  Intact  Cognition:  WNL  Sleep:   Good     Demographic Factors:  Male, Low socioeconomic status and Unemployed  Loss Factors: Financial problems/change in socioeconomic status  Historical Factors: Impulsivity  Risk Reduction Factors:   Sense of responsibility to family and Living with another person, especially a relative  Continued Clinical Symptoms:  Schizophrenia:   Paranoid or undifferentiated type  Cognitive Features That Contribute To Risk:  Closed-mindedness    Suicide Risk:  Minimal: No identifiable suicidal ideation.  Patients presenting with no risk factors but with morbid ruminations; may be classified as minimal risk based on the severity of the depressive symptoms   Plan Of Care/Follow-up recommendations:  Activity:  as tolerated Diet:  heart healthy  Disposition: Take all medications as prescribed. Keep your appointment with Dr Doyne Keel on 11-17-17 at 1130.  Do not consume alcohol or use illegal drugs while on  prescription medications. Report any adverse effects from your medications to your primary care provider promptly.  In the event of recurrent symptoms or worsening symptoms, call 911, a crisis hotline, or go to the nearest emergency department for evaluation.   Ethelene Hal, NP 11/16/2017,  12:44 PM

## 2017-11-16 NOTE — Discharge Instructions (Signed)
For your behavioral health needs, you are advised to continue treatment at the [facility].  You have an appointment with Charlcie Cradle, MD scheduled for tomorrow, Thursday, November 17, 2017 at 11:30 am:       Charlcie Cradle, MD      [facility]. [address] [place], [place] [address]      [phone]

## 2017-11-16 NOTE — BH Assessment (Signed)
[facility] Assessment Progress Note  Per Hampton Abbot, MD, this pt does not require psychiatric hospitalization at this time.  Pt is to be discharged from [facility] with recommendation to continue outpatient treatment at the [facility] at [place].  At 12:06 this writer called pt's mother, Rahshawn Remo [phone]), with whom pt's lives, and who is reportedly pt's legal guardian, to notify her of disposition.  Ms Ancheta confirms that she is pt's legal guardian.  She agrees to arrange to come to [facility] to pick pt up, and will bring letter of guardianship for pt's record.  I provided her with my phone number, and she agrees to call with an estimated arrival time.  She requests that I schedule pt for an outpatient visit.  I called the Outpatient Clinic and scheduled pt for a visit with Charlcie Cradle, MD for tomorrow, Thursday, 11/17/2017 at 11:30.  This has been included in pt's discharge instructions.  Pt's nurse, Diane, has been notified.  Jalene Mullet, [place] Triage Specialist [phone]

## 2017-11-17 ENCOUNTER — Encounter (HOSPITAL_COMMUNITY): Payer: Self-pay | Admitting: Psychiatry

## 2017-11-17 ENCOUNTER — Encounter (HOSPITAL_COMMUNITY): Payer: Self-pay | Admitting: Emergency Medicine

## 2017-11-17 ENCOUNTER — Inpatient Hospital Stay: Payer: Self-pay | Admitting: Family Medicine

## 2017-11-17 ENCOUNTER — Ambulatory Visit (INDEPENDENT_AMBULATORY_CARE_PROVIDER_SITE_OTHER): Payer: Medicare HMO | Admitting: Psychiatry

## 2017-11-17 ENCOUNTER — Other Ambulatory Visit: Payer: Self-pay

## 2017-11-17 ENCOUNTER — Emergency Department (HOSPITAL_COMMUNITY)
Admission: EM | Admit: 2017-11-17 | Discharge: 2017-11-18 | Disposition: A | Discharge status: Other Acute Inpatient Hospital | Payer: Medicare HMO | Attending: Emergency Medicine | Admitting: Emergency Medicine

## 2017-11-17 VITALS — BP 130/74 | HR 80 | Ht 70.0 in | Wt 236.0 lb

## 2017-11-17 DIAGNOSIS — E1122 Type 2 diabetes mellitus with diabetic chronic kidney disease: Secondary | ICD-10-CM | POA: Diagnosis not present

## 2017-11-17 DIAGNOSIS — Z794 Long term (current) use of insulin: Secondary | ICD-10-CM | POA: Diagnosis not present

## 2017-11-17 DIAGNOSIS — I129 Hypertensive chronic kidney disease with stage 1 through stage 4 chronic kidney disease, or unspecified chronic kidney disease: Secondary | ICD-10-CM | POA: Diagnosis not present

## 2017-11-17 DIAGNOSIS — N183 Chronic kidney disease, stage 3 (moderate): Secondary | ICD-10-CM | POA: Diagnosis not present

## 2017-11-17 DIAGNOSIS — E119 Type 2 diabetes mellitus without complications: Secondary | ICD-10-CM | POA: Insufficient documentation

## 2017-11-17 DIAGNOSIS — F250 Schizoaffective disorder, bipolar type: Secondary | ICD-10-CM | POA: Insufficient documentation

## 2017-11-17 DIAGNOSIS — F201 Disorganized schizophrenia: Secondary | ICD-10-CM | POA: Diagnosis not present

## 2017-11-17 DIAGNOSIS — Z7689 Persons encountering health services in other specified circumstances: Secondary | ICD-10-CM | POA: Insufficient documentation

## 2017-11-17 DIAGNOSIS — Z79899 Other long term (current) drug therapy: Secondary | ICD-10-CM | POA: Insufficient documentation

## 2017-11-17 DIAGNOSIS — Z046 Encounter for general psychiatric examination, requested by authority: Secondary | ICD-10-CM | POA: Diagnosis present

## 2017-11-17 DIAGNOSIS — F17210 Nicotine dependence, cigarettes, uncomplicated: Secondary | ICD-10-CM | POA: Insufficient documentation

## 2017-11-17 DIAGNOSIS — R45850 Homicidal ideations: Secondary | ICD-10-CM

## 2017-11-17 DIAGNOSIS — R748 Abnormal levels of other serum enzymes: Secondary | ICD-10-CM | POA: Diagnosis not present

## 2017-11-17 DIAGNOSIS — F200 Paranoid schizophrenia: Secondary | ICD-10-CM

## 2017-11-17 DIAGNOSIS — F17290 Nicotine dependence, other tobacco product, uncomplicated: Secondary | ICD-10-CM | POA: Diagnosis not present

## 2017-11-17 DIAGNOSIS — Z7982 Long term (current) use of aspirin: Secondary | ICD-10-CM | POA: Diagnosis not present

## 2017-11-17 LAB — COMPREHENSIVE METABOLIC PANEL
ALBUMIN: 3.7 g/dL (ref 3.5–5.0)
ALT: 145 U/L — ABNORMAL HIGH (ref 0–44)
ANION GAP: 9 (ref 5–15)
AST: 152 U/L — ABNORMAL HIGH (ref 15–41)
Alkaline Phosphatase: 88 U/L (ref 38–126)
BUN: 12 mg/dL (ref 6–20)
CALCIUM: 9.4 mg/dL (ref 8.9–10.3)
CHLORIDE: 108 mmol/L (ref 98–111)
CO2: 22 mmol/L (ref 22–32)
CREATININE: 1.73 mg/dL — AB (ref 0.61–1.24)
GFR calc non Af Amer: 44 mL/min — ABNORMAL LOW (ref >60)
GFR, EST AFRICAN AMERICAN: 51 mL/min — AB (ref >60)
Glucose, Bld: 95 mg/dL (ref 70–99)
POTASSIUM: 3.9 mmol/L (ref 3.5–5.1)
SODIUM: 139 mmol/L (ref 135–145)
TOTAL PROTEIN: 7.3 g/dL (ref 6.5–8.1)
Total Bilirubin: 0.5 mg/dL (ref 0.3–1.2)

## 2017-11-17 LAB — SALICYLATE LEVEL: Salicylate Lvl: <7 mg/dL (ref 2.8–30.0)

## 2017-11-17 LAB — CBC WITH DIFFERENTIAL/PLATELET
BASOS ABS: 0 10*3/uL (ref 0.0–0.1)
BASOS PCT: 0 %
EOS PCT: 2 %
Eosinophils Absolute: 0.2 10*3/uL (ref 0.0–0.7)
HCT: 41.4 % (ref 39.0–52.0)
Hemoglobin: 14.2 g/dL (ref 13.0–17.0)
LYMPHS PCT: 20 %
Lymphs Abs: 1.8 10*3/uL (ref 0.7–4.0)
MCH: 29.9 pg (ref 26.0–34.0)
MCHC: 34.3 g/dL (ref 30.0–36.0)
MCV: 87.2 fL (ref 78.0–100.0)
MONO ABS: 0.7 10*3/uL (ref 0.1–1.0)
Monocytes Relative: 8 %
Neutro Abs: 6.2 10*3/uL (ref 1.7–7.7)
Neutrophils Relative %: 70 %
PLATELETS: 521 10*3/uL — AB (ref 150–400)
RBC: 4.75 MIL/uL (ref 4.22–5.81)
RDW: 13.7 % (ref 11.5–15.5)
WBC: 8.8 10*3/uL (ref 4.0–10.5)

## 2017-11-17 LAB — ACETAMINOPHEN LEVEL: Acetaminophen (Tylenol), Serum: <10 ug/mL — ABNORMAL LOW (ref 10–30)

## 2017-11-17 LAB — ETHANOL: Alcohol, Ethyl (B): <10 mg/dL (ref <10)

## 2017-11-17 MED ORDER — RISPERIDONE 1 MG PO TABS
1.0000 mg | ORAL_TABLET | Freq: Once | ORAL | Status: AC
  Administered 2017-11-17: 1 mg via ORAL
  Filled 2017-11-17: qty 1

## 2017-11-17 MED ORDER — MIRTAZAPINE 30 MG PO TABS
30.0000 mg | ORAL_TABLET | Freq: Every day | ORAL | Status: DC
  Administered 2017-11-17: 30 mg via ORAL
  Filled 2017-11-17: qty 1

## 2017-11-17 MED ORDER — TRAZODONE HCL 100 MG PO TABS
100.0000 mg | ORAL_TABLET | Freq: Every day | ORAL | Status: DC
  Administered 2017-11-17: 100 mg via ORAL
  Filled 2017-11-17: qty 1

## 2017-11-17 MED ORDER — RISPERIDONE 2 MG PO TABS
2.0000 mg | ORAL_TABLET | Freq: Every day | ORAL | Status: DC
  Administered 2017-11-18: 2 mg via ORAL
  Filled 2017-11-17: qty 1

## 2017-11-17 MED ORDER — INSULIN GLARGINE 100 UNIT/ML ~~LOC~~ SOLN
60.0000 [IU] | Freq: Every day | SUBCUTANEOUS | Status: DC
  Administered 2017-11-18: 60 [IU] via SUBCUTANEOUS
  Filled 2017-11-17: qty 0.6

## 2017-11-17 MED ORDER — RISPERIDONE 2 MG PO TABS
4.0000 mg | ORAL_TABLET | Freq: Every day | ORAL | Status: DC
  Administered 2017-11-17: 4 mg via ORAL
  Filled 2017-11-17: qty 2

## 2017-11-17 MED ORDER — IPRATROPIUM-ALBUTEROL 0.5-2.5 (3) MG/3ML IN SOLN: 3.0000 mL | Freq: Once | RESPIRATORY_TRACT | Status: DC

## 2017-11-17 MED ORDER — REPAGLINIDE 2 MG PO TABS
2.0000 mg | ORAL_TABLET | Freq: Two times a day (BID) | ORAL | Status: DC
  Administered 2017-11-18: 2 mg via ORAL
  Filled 2017-11-17 (×2): qty 1

## 2017-11-17 MED ORDER — INSULIN PEN NEEDLE 32G X 6 MM MISC: 1.0000 [IU] | Status: DC

## 2017-11-17 MED ORDER — METOPROLOL TARTRATE 25 MG PO TABS
25.0000 mg | ORAL_TABLET | Freq: Two times a day (BID) | ORAL | Status: DC
  Administered 2017-11-17 – 2017-11-18 (×2): 25 mg via ORAL
  Filled 2017-11-17 (×2): qty 1

## 2017-11-17 MED ORDER — LORATADINE 10 MG PO TABS
10.0000 mg | ORAL_TABLET | Freq: Every day | ORAL | Status: DC
  Administered 2017-11-18: 10 mg via ORAL
  Filled 2017-11-17: qty 1

## 2017-11-17 MED ORDER — ASPIRIN EC 81 MG PO TBEC
81.0000 mg | DELAYED_RELEASE_TABLET | Freq: Every day | ORAL | Status: DC
  Administered 2017-11-17 – 2017-11-18 (×2): 81 mg via ORAL
  Filled 2017-11-17 (×2): qty 1

## 2017-11-17 MED ORDER — NICOTINE 14 MG/24HR TD PT24
14.0000 mg | MEDICATED_PATCH | Freq: Once | TRANSDERMAL | Status: DC
  Administered 2017-11-17: 14 mg via TRANSDERMAL
  Filled 2017-11-17: qty 1

## 2017-11-17 MED ORDER — ALBUTEROL SULFATE HFA 108 (90 BASE) MCG/ACT IN AERS
1.0000 | INHALATION_SPRAY | Freq: Four times a day (QID) | RESPIRATORY_TRACT | Status: DC
  Administered 2017-11-17 – 2017-11-18 (×4): 2 via RESPIRATORY_TRACT
  Filled 2017-11-17: qty 6.7

## 2017-11-17 MED ORDER — SIMVASTATIN 40 MG PO TABS
40.0000 mg | ORAL_TABLET | Freq: Every day | ORAL | Status: DC
  Administered 2017-11-17: 40 mg via ORAL
  Filled 2017-11-17: qty 1

## 2017-11-17 MED ORDER — MOMETASONE FURO-FORMOTEROL FUM 200-5 MCG/ACT IN AERO
2.0000 | INHALATION_SPRAY | Freq: Two times a day (BID) | RESPIRATORY_TRACT | Status: DC
  Administered 2017-11-18: 2 via RESPIRATORY_TRACT
  Filled 2017-11-17: qty 8.8

## 2017-11-17 MED ORDER — VITAMIN D (ERGOCALCIFEROL) 1.25 MG (50000 UNIT) PO CAPS: 50000.0000 [IU] | ORAL_CAPSULE | ORAL | Status: DC

## 2017-11-17 NOTE — ED Provider Notes (Signed)
[facility] DEPT Provider Note   CSN: [id number] Arrival date & time: 11/17/17  1408     History   Chief Complaint Chief Complaint  Patient presents with  . Delusional  . IVC    HPI [name] [name]. is a 52 y.o. male.  HPI  [name] [name] is a 52 y/o male with a h/o T2DM, HT, HLD, schizophrenia, schizoaffective disorder (bipolar type), who presents to the ED today via [facility] under IVC for psychiatric evaluation and delusional behavior.  Upon interviewing the patient he states he does not know why he is here.  He states he was seeing his psychiatrist prior to arrival and allowed to smoke a cigarette and the police made him walk to [facility] hospital.  He currently denies any suicidal ideations, auditory or visual hallucinations.  He does endorse feelings of wanting to harm others.  States that he wants to hurt "whoever may be come here and spend all my money.  "  Patient states he has a history of drug use and has used marijuana in the past but he has not used any in months.  States that he does drink alcohol however not daily.  States he drinks "however much I can afford at the time "..  States he had part of a beer yesterday but today.  [name] [name] was at [facility] for outpatient treatment. Dr. Sharlee Blew took out IVC paperwork on patient which reads as follows, "[name] [name] is aactively responding to internal stimuli, paranoid & has disorganized thinking. Mom (guardian) states [name] [name] is doing bizarre things, talking about voices & has threatened violence. She feels unsafe. He is a danger to himself and others".  Past Medical History:  Diagnosis Date  . Diabetes mellitus type II, uncontrolled ([place])   . HTN (hypertension)   . Hyperlipidemia LDL goal <70   . Schizophrenia [facility])     Patient Active Problem List   Diagnosis Date Noted  . DKA (diabetic ketoacidoses) ([place]) 11/05/2017  . Acute renal failure superimposed on stage 3 chronic kidney disease ([place]) 11/05/2017  .  Type II diabetes mellitus with renal manifestations ([place]) 11/05/2017  . Depression 11/05/2017  . Leukocytosis 11/05/2017  . Cough 11/05/2017  . Tobacco abuse 11/05/2017  . Schizoaffective disorder, bipolar type ([place]) 06/10/2016  . DM (diabetes mellitus) type II uncontrolled, periph vascular disorder ([place]) 02/29/2016  . Schizophrenia, disorganized type ([place]) 06/02/2015  . Hyperlipidemia 09/23/2014  . Renal insufficiency 09/23/2014  . HTN (hypertension) 03/22/2013  . Seasonal allergies 03/22/2013    History reviewed. No pertinent surgical history.      Home Medications    Prior to Admission medications   Medication Sig Start Date End Date Taking? Authorizing Provider  ALLERGY RELIEF 10 MG tablet TAKE 1 TABLET BY MOUTH EVERY DAY Patient taking differently: Take 10 mg by mouth daily as needed for allergies.  08/23/16  Yes Ann Held, DO  aspirin EC 81 MG tablet Take 1 tablet (81 mg total) by mouth daily. 01/21/16  Yes Debbrah Alar, NP  Fluticasone-Salmeterol (ADVAIR DISKUS) 250-50 MCG/DOSE AEPB Inhale 1 puff into the lungs 2 (two) times daily. Patient taking differently: Inhale 1 puff into the lungs 2 (two) times daily as needed (wheezing).  09/30/17  Yes Roma Schanz R, DO  insulin glargine (LANTUS) 100 UNIT/ML injection Inject 0.6 mLs (60 Units total) into the skin daily. 11/09/17  Yes Purohit, Konrad Dolores, MD  metoprolol tartrate (LOPRESSOR) 25 MG tablet TAKE 1 TABLET BY MOUTH TWICE A  DAY Patient taking differently: Take 25 mg by mouth 2 (two) times daily.  09/16/17  Yes Roma Schanz R, DO  mirtazapine (REMERON) 30 MG tablet Take 1 tablet (30 mg total) by mouth at bedtime. 08/09/17  Yes Arfeen, Arlyce Harman, MD  Multiple Vitamin (MULTIVITAMIN WITH MINERALS) TABS tablet Take 1 tablet by mouth daily.   Yes [provider]  repaglinide (PRANDIN) 2 MG tablet Take 1 tablet (2 mg total) by mouth 3 (three) times daily before meals. Patient taking differently: Take 2  mg by mouth 2 (two) times daily before a meal.  11/08/17 12/08/17 Yes Purohit, Konrad Dolores, MD  risperiDONE (RISPERDAL) 2 MG tablet TAKE 1 TABLET IN THE MORNING AND TAKE 2 TABLETS AT BEDTIME Patient taking differently: Take 2-4 mg by mouth See admin instructions. TAKE 1 TABLET IN THE MORNING AND TAKE 2 TABLETS AT BEDTIME 08/09/17  Yes Arfeen, Arlyce Harman, MD  simvastatin (ZOCOR) 40 MG tablet TAKE 1 TABLET BY MOUTH EVERY DAY Patient taking differently: Take 40 mg by mouth daily.  08/29/17  Yes Roma Schanz R, DO  traZODone (DESYREL) 100 MG tablet Take 1 tablet (100 mg total) by mouth at bedtime. 08/09/17  Yes Arfeen, Arlyce Harman, MD  Vitamin D, Ergocalciferol, (DRISDOL) 50000 units CAPS capsule Take 1 capsule by mouth once a week. On Monday 11/22/16  Yes [provider]  blood glucose meter kit and supplies KIT Dispense based on patient and insurance preference. Use up to four times daily as directed. (FOR ICD-9 250.00, 250.01). 11/08/17   Purohit, Konrad Dolores, MD  Insulin Pen Needle 32G X 6 MM MISC 1 Units by Does not apply route every morning. 11/08/17   Purohit, Konrad Dolores, MD    Family History Family History  Problem Relation Age of Onset  . Breast cancer Mother   . Diabetes Mother   . Prostate cancer Father        StepFather  . Hypertension Father   . Breast cancer Maternal Aunt   . Diabetes Maternal Uncle     Social History Social History   Tobacco Use  . Smoking status: Current Every Day Smoker    Packs/day: 2.00    Years: 30.00    Pack years: 60.00    Types: Cigarettes, Cigars  . Smokeless tobacco: Never Used  Substance Use Topics  . Alcohol use: Yes    Comment: occasional  . Drug use: Yes    Types: Marijuana     Allergies   Patient has no known allergies.   Review of Systems Review of Systems  Constitutional: Negative for chills and fever.  HENT: Negative for ear pain and sore throat.   Eyes: Negative for pain and visual disturbance.  Respiratory: Negative for shortness  of breath.   Cardiovascular: Negative for chest pain.  Gastrointestinal: Negative for abdominal pain and vomiting.  Genitourinary: Negative for dysuria and hematuria.  Musculoskeletal: Negative for back pain.  Skin: Negative for color change and rash.  Neurological: Negative for seizures.  Psychiatric/Behavioral:       Wants to hurt others, no SI or AVH  All other systems reviewed and are negative.  Physical Exam Updated Vital Signs BP 129/84 (BP Location: Left Arm)   Pulse 91   Temp 98.7 F (37.1 C) (Oral)   Resp 18   Ht _0  (1.753 m)   Wt 97.5 kg   SpO2 95%   BMI 31.75 kg/m   Physical Exam  Constitutional: He appears well-developed and well-nourished. No  distress.  Sleeping comfortably but easily arousable  HENT:  Head: Normocephalic and atraumatic.  Eyes: Conjunctivae are normal.  Neck: Neck supple.  Cardiovascular: Normal rate and regular rhythm.  No murmur heard. Pulmonary/Chest: Effort normal and breath sounds normal. No respiratory distress.  Abdominal: Soft. There is no tenderness.  Musculoskeletal: He exhibits no edema.  Neurological: He is alert.  Skin: Skin is warm and dry.  Psychiatric:  Flat affect, withdrawn, endorsing wanting to hurt others, no SI or AVH  Nursing note and vitals reviewed.    ED Treatments / Results  Labs (all labs ordered are listed, but only abnormal results are displayed) Labs Reviewed  COMPREHENSIVE METABOLIC PANEL - Abnormal; Notable for the following components:      Result Value   Creatinine, Ser 1.73 (*)    AST 152 (*)    ALT 145 (*)    GFR calc non Af Amer 44 (*)    GFR calc Af Amer 51 (*)    All other components within normal limits  CBC WITH DIFFERENTIAL/PLATELET - Abnormal; Notable for the following components:   Platelets 521 (*)    All other components within normal limits  ACETAMINOPHEN LEVEL - Abnormal; Notable for the following components:   Acetaminophen (Tylenol), Serum <10 (*)    All other components  within normal limits  ETHANOL  SALICYLATE LEVEL  RAPID URINE DRUG SCREEN, HOSP PERFORMED    EKG None  Radiology No results found.  Procedures Procedures (including critical care time)  Medications Ordered in ED Medications  risperiDONE (RISPERDAL) tablet 2 mg (has no administration in time range)    And  risperiDONE (RISPERDAL) tablet 4 mg (4 mg Oral Given 11/17/17 2133)  traZODone (DESYREL) tablet 100 mg (100 mg Oral Given 11/17/17 2131)  mirtazapine (REMERON) tablet 30 mg (30 mg Oral Given 11/17/17 2132)  nicotine (NICODERM CQ - dosed in mg/24 hours) patch 14 mg (14 mg Transdermal Patch Applied 11/17/17 1720)  loratadine (CLARITIN) tablet 10 mg (has no administration in time range)  aspirin EC tablet 81 mg (81 mg Oral Given 11/17/17 2131)  mometasone-formoterol (DULERA) 200-5 MCG/ACT inhaler 2 puff (has no administration in time range)  insulin glargine (LANTUS) injection 60 Units (has no administration in time range)  metoprolol tartrate (LOPRESSOR) tablet 25 mg (25 mg Oral Given 11/17/17 2133)  simvastatin (ZOCOR) tablet 40 mg (40 mg Oral Given 11/17/17 2132)  repaglinide (PRANDIN) tablet 2 mg (has no administration in time range)  Vitamin D (Ergocalciferol) (DRISDOL) capsule 50,000 Units (has no administration in time range)  ipratropium-albuterol (DUONEB) 0.5-2.5 (3) MG/3ML nebulizer solution 3 mL (has no administration in time range)  albuterol (PROVENTIL HFA;VENTOLIN HFA) 108 (90 Base) MCG/ACT inhaler 1-2 puff (has no administration in time range)  risperiDONE (RISPERDAL) tablet 1 mg (1 mg Oral Given 11/17/17 1609)     Initial Impression / Assessment and Plan / ED Course  I have reviewed the triage vital signs and the nursing notes.  Pertinent labs & imaging results that were available during my care of the patient were reviewed by me and considered in my medical decision making (see chart for details).     Final Clinical Impressions(s) / ED Diagnoses   Final diagnoses:    Elevated liver enzymes  Encounter for psychiatric assessment   [name] [name] is a 52 y/o male with a h/o T2DM, HT, HLD, schizophrenia, schizoaffective disorder (bipolar type), who presents to the ED today via [facility] under IVC for psychiatric evaluation and delusional, behavior.  He currently  denies any suicidal ideations, auditory or visual hallucinations.  He does endorse feelings of wanting to harm others.  States that he wants to hurt "whoever may be come here and spend all my money.  "  [name] [name] was at [facility] for outpatient treatment. Dr. Sharlee Blew took out IVC paperwork on patient which reads as follows, "[name] [name] is aactively responding to internal stimuli, paranoid & has disorganized thinking. Mom (guardian) states [name] [name] is doing bizarre things, talking about voices & has threatened violence. She feels unsafe. He is a danger to himself and others".  Patient has no medical complaints.  CBC with thrombocytosis to 521, appears consistent with baseline.  CMP with elevated creatinine 1.7 which appears consistent with baseline.  Liver enzymes are elevated with AST of 168 and ALT of 239.  This appears somewhat worse from prior. He will be advised to follow-up about this with his primary doctor.  Salicylate acetaminophen and EtOH levels are negative.  UDS is currently pending.  Patient is otherwise medically cleared for TTS consult.  Behavioral health evaluated the patient and recommended inpatient psychiatric treatment.  Patient care will be transitioned to the default provider at shift change.  ED Discharge Orders    None       Bishop [name] 11/17/17 2214    Virgel Manifold, MD 11/18/17 1022

## 2017-11-17 NOTE — ED Notes (Signed)
Bed: WBH37 Expected date:  Expected time:  Means of arrival:  Comments: 

## 2017-11-17 NOTE — BH Assessment (Signed)
Assessment Note  Per Dr. Doyne Keel on 11/17/17:  Per chart review patient went to [facility] ED yesterday after getting into an argument with his mother and exhibiting some bizarre behavior.  He later denied any psychosis or SI/HI.  He was discharged home and scheduled a follow-up with me today.  He usually sees Dr. Adele Schilder for psychiatric treatment.  Patient reports that he hears multiple voices inside of his head that talk to him all the time.  He was unable to state what the content of the voices were.  He was actively responding to the voices today.  He is paranoid and stated that his mother is an impostor.  He stated that someone who looked like his mother invited him out to dinner for his birthday but instead took him to the emergency room yesterday.  He made some threatening statements about the individual who looks like his mother-stating that he was going to "do what I have to do" and that the police were with him.  And when asked to clarify he stated "I said what I said."  Patient here today with his mother and his friend. Mom reports no hx of violence but states that over the last 1 week he has been acting bizarre.  He screamed at them in a grocery store last week which is very unusual for him.  Yesterday he threatened to burn down the house.  Last night he locked all the doors and she felt unsafe being around him.  She states that he has never threatened any sort of violence in the past.  She feels he is a danger to himself and others and is not comfortable with him in the house at this point in time.  A friend who attended today's appointment also confirmed the statements and stated that pt is compliant with his meds.   Per TTS Assessment by D.Dubose 11/15/17: [name] [name]. is an 52 y.o. male present to [facility] as a walk-in accompanied by his mother with complaints of bizarre behavior and auditory hallucinations. Per report from patient's mother she feels patient is having a psychotic break.  Patient is a poor historian and speaks in a tangential language and appears to be delusional. Patient also displayed thought blocking. Patient denies suicidal / homicidal ideations and denies experiencing auditory / visual hallucinations. Patient report he taking his medication as prescribed but patient's mother feels patient is not taking his medication. Patient was last seen in the ED 08/09/2017 for bizarre behavior and last seen for an assessment 3.28.2019. Patient psychiatrist is Dr. Adele Schilder.    Diagnosis:  F25.0 Schizoaffective Disorder Bipolar Type, F 20.3 Schizophrenia Undifferentiated  Past Medical History:  Past Medical History:  Diagnosis Date  . Diabetes mellitus type II, uncontrolled ([place])   . HTN (hypertension)   . Hyperlipidemia LDL goal <70   . Schizophrenia ([place])     History reviewed. No pertinent surgical history.  Family History:  Family History  Problem Relation Age of Onset  . Breast cancer Mother   . Diabetes Mother   . Prostate cancer Father        StepFather  . Hypertension Father   . Breast cancer Maternal Aunt   . Diabetes Maternal Uncle     Social History:  reports that he has been smoking cigarettes and cigars. He has a 60.00 pack-year smoking history. He has never used smokeless tobacco. He reports that he drinks alcohol. He reports that he has current or past drug history. Drug: Marijuana.  Additional Social History:  Alcohol / Drug Use Pain Medications: See MAR Prescriptions: See MAR Over the Counter: SEE MAR History of alcohol / drug use?: Yes Longest period of sobriety (when/how long): not assessed Substance #1 Name of Substance 1: Marijuana 1 - Age of First Use: unknown 1 - Amount (size/oz): unknown 1 - Frequency: unknown 1 - Duration: unknown 1 - Last Use / Amount: 5 months ago  CIWA: CIWA-Ar BP: 110/78 Pulse Rate: 84 COWS:    Allergies: No Known Allergies  Home Medications:  (Not in a hospital admission)  OB/GYN Status:  No LMP  for male patient.  General Assessment Data Location of Assessment: [facility] TTS Assessment: In system Is this a Tele or Face-to-Face Assessment?: Face-to-Face Is this an Initial Assessment or a Re-assessment for this encounter?: Initial Assessment Patient Accompanied by:: Parent Language Other than English: No Living Arrangements: Other (Comment) What gender do you identify as?: Male(patient lives with his mother) Marital status: Single Living Arrangements: Parent Can pt return to current living arrangement?: Yes Admission Status: Voluntary Is patient capable of signing voluntary admission?: Yes Referral Source: Self/Family/Friend Insurance type: Personnel officer Medicare)     Crisis Care Plan Living Arrangements: Parent Legal Guardian: Mother Name of Psychiatrist: (Dr. Adele Schilder) Name of Therapist: (none reported)  Education Status Is patient currently in school?: No Is the patient employed, unemployed or receiving disability?: Unemployed  Risk to self with the past 6 months Suicidal Ideation: No Has patient been a risk to self within the past 6 months prior to admission? : No Suicidal Intent: No Has patient had any suicidal intent within the past 6 months prior to admission? : No Is patient at risk for suicide?: No Suicidal Plan?: No Has patient had any suicidal plan within the past 6 months prior to admission? : No Access to Means: No What has been your use of drugs/alcohol within the last 12 months?: THC Previous Attempts/Gestures: No How many times?: 0 Other Self Harm Risks: none Triggers for Past Attempts: None known Intentional Self Injurious Behavior: None Family Suicide History: No Recent stressful life event(s): (mother's health) Persecutory voices/beliefs?: No Depression: No Depression Symptoms: Despondent, Insomnia, Isolating, Loss of interest in usual pleasures Suicide prevention information given to non-admitted patients: Not applicable  Risk to Others within the  past 6 months Homicidal Ideation: No Does patient have any lifetime risk of violence toward others beyond the six months prior to admission? : No Thoughts of Harm to Others: No Current Homicidal Intent: No Current Homicidal Plan: No Access to Homicidal Means: No Identified Victim: n/a History of harm to others?: No Assessment of Violence: None Noted Violent Behavior Description: verbal threats Does patient have access to weapons?: No Criminal Charges Pending?: No Does patient have a court date: No Is patient on probation?: No  Psychosis Hallucinations: Auditory, Visual Delusions: (thinks his mother is an Agricultural consultant)  Mental Status Report Appearance/Hygiene: Disheveled Eye Contact: Poor Motor Activity: Freedom of movement Speech: Tangential Level of Consciousness: Alert Mood: Pleasant Affect: Flat Anxiety Level: None Thought Processes: Tangential, Thought Blocking Judgement: Impaired Obsessive Compulsive Thoughts/Behaviors: None  Cognitive Functioning Concentration: Decreased Memory: Unable to Assess Is patient IDD: No Insight: Unable to Assess Impulse Control: Poor Appetite: Fair Have you had any weight changes? : No Change Sleep: Unable to Assess Total Hours of Sleep: (UTA) Vegetative Symptoms: Decreased grooming  ADLScreening [facility] Assessment Services) Patient's cognitive ability adequate to safely complete daily activities?: Yes Patient able to express need for assistance with ADLs?: Yes Independently performs  ADLs?: Yes (appropriate for developmental age)  Prior Inpatient Therapy Prior Inpatient Therapy: No  Prior Outpatient Therapy Prior Outpatient Therapy: Yes Prior Therapy Dates: (active) Prior Therapy Facilty/Provider(s): Dr. Adele Schilder Reason for Treatment: (psychosis) Does patient have an ACCT team?: No Does patient have Intensive In-House Services?  : No Does patient have [facility] services? : No Does patient have P4CC services?: No  ADL Screening  (condition at time of admission) Patient's cognitive ability adequate to safely complete daily activities?: Yes Is the patient deaf or have difficulty hearing?: No Does the patient have difficulty seeing, even when wearing glasses/contacts?: No Does the patient have difficulty concentrating, remembering, or making decisions?: No Patient able to express need for assistance with ADLs?: Yes Does the patient have difficulty dressing or bathing?: No Independently performs ADLs?: Yes (appropriate for developmental age) Does the patient have difficulty walking or climbing stairs?: No Weakness of Legs: None Weakness of Arms/Hands: None     Therapy Consults (therapy consults require a physician order) PT Evaluation Needed: No OT Evalulation Needed: No SLP Evaluation Needed: No Abuse/Neglect Assessment (Assessment to be complete while patient is alone) Abuse/Neglect Assessment Can Be Completed: Yes Physical Abuse: Denies Verbal Abuse: Denies Sexual Abuse: Denies Exploitation of patient/patient's resources: Denies Self-Neglect: Denies Values / Beliefs Cultural Requests During Hospitalization: None Spiritual Requests During Hospitalization: None Consults Spiritual Care Consult Needed: No Social Work Consult Needed: No Regulatory affairs officer (For Healthcare) Does Patient Have a Medical Advance Directive?: Yes Does patient want to make changes to medical advance directive?: No - Patient declined Copy of [place] in Chart?: No - copy requested Would patient like information on creating a medical advance directive?: No - Patient declined Nutrition Screen- [facility] Adult/[facility] Has the patient recently lost weight without trying?: No Has the patient been eating poorly because of a decreased appetite?: No Malnutrition Screening Tool Score: 0        Disposition: Per Jinny Blossom, NP, patient meets inpatient admission criteria: Disposition Initial Assessment Completed for this  Encounter: Yes Disposition of Patient: Admit Type of inpatient treatment program: Adult  On Site Evaluation by:   Reviewed with Physician:    Judeth Porch Maciah Schweigert 11/17/2017 4:49 PM

## 2017-11-17 NOTE — Discharge Instructions (Addendum)
Your liver enzymes were found to be elevated in the emergency department.  You will need to follow-up with your regular doctor to have these values rechecked.

## 2017-11-17 NOTE — ED Notes (Signed)
Bed: WA31 Expected date:  Expected time:  Means of arrival:  Comments: 

## 2017-11-17 NOTE — ED Notes (Signed)
On admission to the Acute Unit pt is calm and cooperative.  

## 2017-11-17 NOTE — Progress Notes (Signed)
BH MD/PA/NP OP Progress Note  11/17/2017 1:01 PM [name] [name].  MRN:  [id number]  Chief Complaint:  Chief Complaint    Paranoid; Schizophrenia      HPI: Per chart review patient went to [facility] ED yesterday after getting into an argument with his mother and exhibiting some bizarre behavior.  He later denied any psychosis or SI/HI.  He was discharged home and scheduled a follow-up with me today.  He usually sees Dr. Adele Schilder for psychiatric treatment.  Patient reports that he hears multiple voices inside of his head that talk to him all the time.  He was unable to state what the content of the voices were.  He was actively responding to the voices today.  He is paranoid and stated that his mother is not impostor.  He stated that someone who looked like his mother invited him out to dinner for his birthday but instead took him to the emergency room yesterday.  He made some threatening statements about the individual who looks like his mother-stating that he was going to "do what I have to do" and that the police were with him.  And when asked to clarify he stated "I said what I said."  Patient here today with his mother and his friend. Mom reports no hx of violence but states that over the last 1 week he has been acting bizarre.  He screamed at them in a grocery store last week which is very unusual for him.  Yesterday he threatened to burn down the house.  Last night he locked all the doors and she felt unsafe being around him.  She states that he has never threatened any sort of violence in the past.  She feels he is a danger to himself and others and is not comfortable with him in the house at this point in time.  A friend who attended today's appointment also confirmed the statements and stated that pt is compliant with his meds.     Visit Diagnosis: No diagnosis found.    Past Psychiatric History: Per review of records it appears that his mother, Ms. Schmieder is the patient's  legal guardian.  He lives with her at this time.   Per Dr. Marguerite Olea notes - patient has history of psychiatric illness since 3.  He has multiple hospitalization because of irrational bizarre behavior.  He was [facility] to [facility]  His last hospitalization was in [place].  A history of noncompliance of medication.  In the past he has taken Abilify, Lexapro, Neurontin.  We recommended injectable but his insurance does not approve.  Patient denies any history of suicidal attempt.  Past Medical History:  Past Medical History:  Diagnosis Date  . Diabetes mellitus type II, uncontrolled ([place])   . HTN (hypertension)   . Hyperlipidemia LDL goal <70   . Schizophrenia ([place])    History reviewed. No pertinent surgical history.   Family History:  Family History  Problem Relation Age of Onset  . Breast cancer Mother   . Diabetes Mother   . Prostate cancer Father        StepFather  . Hypertension Father   . Breast cancer Maternal Aunt   . Diabetes Maternal Uncle     Social History:  Social History   Socioeconomic History  . Marital status: Single    Spouse name: Not on file  . Number of children: Not on file  . Years of education:  Not on file  . Highest education level: Not on file  Occupational History  . Not on file  Social Needs  . Financial resource strain: Not on file  . Food insecurity:    Worry: Not on file    Inability: Not on file  . Transportation needs:    Medical: Not on file    Non-medical: Not on file  Tobacco Use  . Smoking status: Current Every Day Smoker    Packs/day: 2.00    Years: 30.00    Pack years: 60.00    Types: Cigarettes, Cigars  . Smokeless tobacco: Never Used  Substance and Sexual Activity  . Alcohol use: Yes    Comment: occasional  . Drug use: Yes    Types: Marijuana  . Sexual activity: Never  Lifestyle  . Physical activity:    Days per week: Not on file    Minutes per session:  Not on file  . Stress: Not on file  Relationships  . Social connections:    Talks on phone: Not on file    Gets together: Not on file    Attends religious service: Not on file    Active member of club or organization: Not on file    Attends meetings of clubs or organizations: Not on file    Relationship status: Not on file  Other Topics Concern  . Not on file  Social History Narrative   Lives with mom   unemployed   No children    Allergies: No Known Allergies  Metabolic Disorder Labs: Lab Results  Component Value Date   HGBA1C 11.3 (H) 11/06/2017   MPG 278 11/06/2017   MPG 151 09/30/2017   No results found for: PROLACTIN Lab Results  Component Value Date   CHOL 151 09/30/2017   TRIG 345 (H) 09/30/2017   HDL 43 09/30/2017   CHOLHDL 3.5 09/30/2017   VLDL 33.2 01/24/2017   LDLCALC 66 09/30/2017   LDLCALC 70 01/24/2017   Lab Results  Component Value Date   TSH 1.58 09/30/2017   TSH 1.38 07/13/2016    Therapeutic Level Labs: No results found for: LITHIUM No results found for: VALPROATE No components found for:  CBMZ  Current Medications: Current Outpatient Medications  Medication Sig Dispense Refill  . aspirin EC 81 MG tablet Take 1 tablet (81 mg total) by mouth daily.    . blood glucose meter kit and supplies KIT Dispense based on patient and insurance preference. Use up to four times daily as directed. (FOR ICD-9 250.00, 250.01). 1 each 0  . Fluticasone-Salmeterol (ADVAIR DISKUS) 250-50 MCG/DOSE AEPB Inhale 1 puff into the lungs 2 (two) times daily. 1 each 3  . insulin glargine (LANTUS) 100 UNIT/ML injection Inject 0.6 mLs (60 Units total) into the skin daily. 10 mL 11  . Insulin Pen Needle 32G X 6 MM MISC 1 Units by Does not apply route every morning. 90 each 1  . metoprolol tartrate (LOPRESSOR) 25 MG tablet TAKE 1 TABLET BY MOUTH TWICE A DAY (Patient taking differently: Take 25 mg by mouth 2 (two) times daily. ) 180 tablet 1  . mirtazapine (REMERON) 30 MG  tablet Take 1 tablet (30 mg total) by mouth at bedtime. 30 tablet 2  . Multiple Vitamin (MULTIVITAMIN WITH MINERALS) TABS tablet Take 1 tablet by mouth daily.    . repaglinide (PRANDIN) 2 MG tablet Take 1 tablet (2 mg total) by mouth 3 (three) times daily before meals. (Patient taking differently: Take 2 mg by mouth  2 (two) times daily before a meal. ) 90 tablet 1  . risperiDONE (RISPERDAL) 2 MG tablet TAKE 1 TABLET IN THE MORNING AND TAKE 2 TABLETS AT BEDTIME (Patient taking differently: Take 2-4 mg by mouth See admin instructions. TAKE 1 TABLET IN THE MORNING AND TAKE 2 TABLETS AT BEDTIME) 90 tablet 2  . simvastatin (ZOCOR) 40 MG tablet TAKE 1 TABLET BY MOUTH EVERY DAY (Patient taking differently: Take 40 mg by mouth daily. ) 90 tablet 1  . traZODone (DESYREL) 100 MG tablet Take 1 tablet (100 mg total) by mouth at bedtime. 30 tablet 2  . Vitamin D, Ergocalciferol, (DRISDOL) 50000 units CAPS capsule Take 1 capsule once a week by mouth.  2  . ALLERGY RELIEF 10 MG tablet TAKE 1 TABLET BY MOUTH EVERY DAY (Patient not taking: No sig reported) 30 tablet 0   No current facility-administered medications for this visit.      Musculoskeletal: Strength & Muscle Tone: within normal limits Gait & Station: normal Patient leans: N/A  Psychiatric Specialty Exam: ROS  Blood pressure 130/74, pulse 80, height 5' 10"  (1.778 m), weight 236 lb (107 kg).Body mass index is 33.86 kg/m.  General Appearance: Disheveled-patient urinated in his clothing while sitting in my office   Eye Contact:  Good  Speech:  Slow  Volume:  Decreased  Mood:  Euthymic  Affect:  Flat  Thought Process:  Disorganized and Descriptions of Associations: Loose  Orientation:  NA  Thought Content: Hallucinations: Auditory and Paranoid Ideation   Suicidal Thoughts:  No  Homicidal Thoughts:  Yes.  without intent/plan  Memory:  Immediate;   Good  Judgement:  Impaired  Insight:  Lacking  Psychomotor Activity:  Normal  Concentration:   Concentration: Poor and Attention Span: Poor  Recall:  [name] of Knowledge: Fair  Language: Fair  Akathisia:  No  Handed:  Right  AIMS (if indicated): not done  Assets:  Housing Social Support  ADL's:  Impaired  Cognition: Impaired,  Mild  Sleep:  Poor   Screenings: PHQ2-9     Office Visit from 09/30/2017 in [employer] at [name]  PHQ-2 Total Score  0       Assessment and Plan: Schizophrenia-paranoid type  Due to current presentation of active psychosis including responding to auditory hallucinations, believing mother to be an impostor, bizarre behavior, disorganized thinking, as well as threatening violence I recommend involuntary commitment to an inpatient psychiatric unit.  Patient declined voluntary admission.  Mother who is patient's legal guardian states that patient has been acting out of character, is paranoid, and is threatening violence.  She states that this is the first time she is felt unsafe and believes he is a danger to himself and others.  She states that either he stays in the house or she stays in the house at this time.  She believes he is in need of inpatient psychiatric care at this point and is agreeable to inpatient psychiatric admission   Status of current problems: worsening psychosis.  I have filled out an affidavit and first exam for IVC.  I have informed my staff who have contacted our crisis department for inpatient psychiatric admission.  Mother is in agreement with the plan.  Meds: Risperdal 2 mg p.o. every morning and 4 mg every afternoon Remeron 30 mg p.o. nightly Trazodone 100 mg p.o. q. p.m.   F/up after hospital d/c  The duration of this appointment visit was 45 minutes of face-to-face time with  the patient.  Greater than 50% of this time was spent in counseling, explanation of  diagnosis, planning of further management, and coordination of care      Charlcie Cradle, MD 11/17/2017, 1:01 PM

## 2017-11-17 NOTE — Progress Notes (Signed)
Pt moved to RM 37.

## 2017-11-17 NOTE — ED Notes (Signed)
Pt sleeping at present, no distress noted, easily arouseable to verbal stimuli.  Monitoring for safety, Q 15 min checks in effect.

## 2017-11-17 NOTE — ED Triage Notes (Signed)
Per [employer], outpatient [facility] took papers out on patient due to responding to internal stimuli, disorganized thinking

## 2017-11-17 NOTE — Progress Notes (Signed)
Pt settled into [facility] ED Room 31. Bed in lowest and locked position. Environment secured. Pt oriented to TCU rules and call light. Blood successfully drawn by ED tech. Pt VSS. Pt resting comfortably in bed in no apparent distress at this time.

## 2017-11-18 ENCOUNTER — Other Ambulatory Visit: Payer: Self-pay

## 2017-11-18 ENCOUNTER — Encounter (HOSPITAL_COMMUNITY): Payer: Self-pay

## 2017-11-18 ENCOUNTER — Inpatient Hospital Stay (HOSPITAL_COMMUNITY)
Admission: AD | Admit: 2017-11-18 | Discharge: 2017-12-02 | DRG: 885 | Disposition: A | Discharge status: Home / Self Care | Payer: Medicare HMO | Source: Intra-hospital | Attending: Psychiatry | Admitting: Psychiatry

## 2017-11-18 DIAGNOSIS — G4700 Insomnia, unspecified: Secondary | ICD-10-CM | POA: Diagnosis present

## 2017-11-18 DIAGNOSIS — F17290 Nicotine dependence, other tobacco product, uncomplicated: Secondary | ICD-10-CM | POA: Diagnosis not present

## 2017-11-18 DIAGNOSIS — F250 Schizoaffective disorder, bipolar type: Secondary | ICD-10-CM | POA: Diagnosis not present

## 2017-11-18 DIAGNOSIS — F201 Disorganized schizophrenia: Secondary | ICD-10-CM | POA: Diagnosis not present

## 2017-11-18 DIAGNOSIS — F200 Paranoid schizophrenia: Principal | ICD-10-CM | POA: Diagnosis present

## 2017-11-18 DIAGNOSIS — R450 Nervousness: Secondary | ICD-10-CM | POA: Diagnosis not present

## 2017-11-18 DIAGNOSIS — Z833 Family history of diabetes mellitus: Secondary | ICD-10-CM

## 2017-11-18 DIAGNOSIS — Z7951 Long term (current) use of inhaled steroids: Secondary | ICD-10-CM | POA: Diagnosis not present

## 2017-11-18 DIAGNOSIS — Z794 Long term (current) use of insulin: Secondary | ICD-10-CM | POA: Diagnosis not present

## 2017-11-18 DIAGNOSIS — Z7982 Long term (current) use of aspirin: Secondary | ICD-10-CM

## 2017-11-18 DIAGNOSIS — E785 Hyperlipidemia, unspecified: Secondary | ICD-10-CM | POA: Diagnosis present

## 2017-11-18 DIAGNOSIS — Z8249 Family history of ischemic heart disease and other diseases of the circulatory system: Secondary | ICD-10-CM | POA: Diagnosis not present

## 2017-11-18 DIAGNOSIS — I129 Hypertensive chronic kidney disease with stage 1 through stage 4 chronic kidney disease, or unspecified chronic kidney disease: Secondary | ICD-10-CM | POA: Diagnosis present

## 2017-11-18 DIAGNOSIS — Z803 Family history of malignant neoplasm of breast: Secondary | ICD-10-CM | POA: Diagnosis not present

## 2017-11-18 DIAGNOSIS — F17210 Nicotine dependence, cigarettes, uncomplicated: Secondary | ICD-10-CM | POA: Diagnosis present

## 2017-11-18 DIAGNOSIS — Z79899 Other long term (current) drug therapy: Secondary | ICD-10-CM

## 2017-11-18 DIAGNOSIS — E1122 Type 2 diabetes mellitus with diabetic chronic kidney disease: Secondary | ICD-10-CM | POA: Diagnosis present

## 2017-11-18 DIAGNOSIS — N183 Chronic kidney disease, stage 3 (moderate): Secondary | ICD-10-CM | POA: Diagnosis present

## 2017-11-18 DIAGNOSIS — J449 Chronic obstructive pulmonary disease, unspecified: Secondary | ICD-10-CM | POA: Diagnosis present

## 2017-11-18 LAB — GLUCOSE, CAPILLARY
GLUCOSE-CAPILLARY: 88 mg/dL (ref 70–99)
GLUCOSE-CAPILLARY: 143 mg/dL — AB (ref 70–99)

## 2017-11-18 LAB — RAPID URINE DRUG SCREEN, HOSP PERFORMED
Amphetamines: NOT DETECTED
Barbiturates: NOT DETECTED
Benzodiazepines: NOT DETECTED
Cocaine: NOT DETECTED
Opiates: NOT DETECTED
Tetrahydrocannabinol: NOT DETECTED

## 2017-11-18 LAB — CBG MONITORING, ED
GLUCOSE-CAPILLARY: 143 mg/dL — AB (ref 70–99)
Glucose-Capillary: 129 mg/dL — ABNORMAL HIGH (ref 70–99)

## 2017-11-18 MED ORDER — ACETAMINOPHEN 325 MG PO TABS
650.0000 mg | ORAL_TABLET | Freq: Four times a day (QID) | ORAL | Status: DC | PRN
  Administered 2017-11-20 – 2017-11-30 (×2): 650 mg via ORAL
  Filled 2017-11-18 (×3): qty 2

## 2017-11-18 MED ORDER — RISPERIDONE 2 MG PO TABS
2.0000 mg | ORAL_TABLET | Freq: Every day | ORAL | Status: DC
  Administered 2017-11-19: 2 mg via ORAL
  Filled 2017-11-18 (×4): qty 1

## 2017-11-18 MED ORDER — SIMVASTATIN 20 MG PO TABS
40.0000 mg | ORAL_TABLET | Freq: Every day | ORAL | Status: DC
  Administered 2017-11-18 – 2017-12-01 (×14): 40 mg via ORAL
  Filled 2017-11-18 (×3): qty 2
  Filled 2017-11-18: qty 1
  Filled 2017-11-18 (×7): qty 2
  Filled 2017-11-18: qty 1
  Filled 2017-11-18: qty 2
  Filled 2017-11-18: qty 14
  Filled 2017-11-18 (×3): qty 2
  Filled 2017-11-18: qty 1

## 2017-11-18 MED ORDER — RISPERIDONE 2 MG PO TABS
4.0000 mg | ORAL_TABLET | Freq: Every day | ORAL | Status: DC
  Administered 2017-11-18: 4 mg via ORAL
  Filled 2017-11-18 (×4): qty 2

## 2017-11-18 MED ORDER — TRAZODONE HCL 50 MG PO TABS
50.0000 mg | ORAL_TABLET | Freq: Every evening | ORAL | Status: DC | PRN
  Administered 2017-11-18 – 2017-11-19 (×2): 50 mg via ORAL
  Filled 2017-11-18: qty 1
  Filled 2017-11-18: qty 7
  Filled 2017-11-18: qty 1

## 2017-11-18 MED ORDER — VITAMIN D (ERGOCALCIFEROL) 1.25 MG (50000 UNIT) PO CAPS
50000.0000 [IU] | ORAL_CAPSULE | ORAL | Status: DC
  Administered 2017-11-21 – 2017-11-28 (×2): 50000 [IU] via ORAL
  Filled 2017-11-18 (×2): qty 1

## 2017-11-18 MED ORDER — ALUM & MAG HYDROXIDE-SIMETH 200-200-20 MG/5ML PO SUSP: 30.0000 mL | ORAL | Status: DC | PRN

## 2017-11-18 MED ORDER — MIRTAZAPINE 30 MG PO TABS
30.0000 mg | ORAL_TABLET | Freq: Every day | ORAL | Status: DC
  Administered 2017-11-18: 30 mg via ORAL
  Filled 2017-11-18 (×3): qty 1
  Filled 2017-11-18: qty 2

## 2017-11-18 MED ORDER — INSULIN ASPART 100 UNIT/ML ~~LOC~~ SOLN
0.0000 [IU] | Freq: Three times a day (TID) | SUBCUTANEOUS | Status: DC
  Administered 2017-11-18: 1 [IU] via SUBCUTANEOUS
  Filled 2017-11-18: qty 1

## 2017-11-18 MED ORDER — TRAZODONE HCL 100 MG PO TABS
100.0000 mg | ORAL_TABLET | Freq: Every day | ORAL | Status: DC
  Administered 2017-11-18: 100 mg via ORAL
  Filled 2017-11-18 (×4): qty 1

## 2017-11-18 MED ORDER — MAGNESIUM HYDROXIDE 400 MG/5ML PO SUSP: 30.0000 mL | Freq: Every day | ORAL | Status: DC | PRN

## 2017-11-18 MED ORDER — HYDROXYZINE HCL 25 MG PO TABS
25.0000 mg | ORAL_TABLET | Freq: Three times a day (TID) | ORAL | Status: DC | PRN
  Administered 2017-11-19 (×2): 25 mg via ORAL
  Filled 2017-11-18: qty 10
  Filled 2017-11-18 (×2): qty 1

## 2017-11-18 MED ORDER — MOMETASONE FURO-FORMOTEROL FUM 200-5 MCG/ACT IN AERO
2.0000 | INHALATION_SPRAY | Freq: Two times a day (BID) | RESPIRATORY_TRACT | Status: DC
  Administered 2017-11-18 – 2017-12-02 (×28): 2 via RESPIRATORY_TRACT
  Filled 2017-11-18 (×2): qty 8.8

## 2017-11-18 MED ORDER — NICOTINE 14 MG/24HR TD PT24
14.0000 mg | MEDICATED_PATCH | Freq: Once | TRANSDERMAL | Status: DC
  Administered 2017-11-18: 14 mg via TRANSDERMAL
  Filled 2017-11-18 (×2): qty 1

## 2017-11-18 MED ORDER — LORATADINE 10 MG PO TABS
10.0000 mg | ORAL_TABLET | Freq: Every day | ORAL | Status: DC
  Administered 2017-11-19 – 2017-12-02 (×14): 10 mg via ORAL
  Filled 2017-11-18 (×2): qty 1
  Filled 2017-11-18: qty 7
  Filled 2017-11-18 (×14): qty 1

## 2017-11-18 MED ORDER — INSULIN ASPART 100 UNIT/ML ~~LOC~~ SOLN
0.0000 [IU] | Freq: Three times a day (TID) | SUBCUTANEOUS | Status: DC
  Administered 2017-11-19 – 2017-11-21 (×4): 1 [IU] via SUBCUTANEOUS
  Administered 2017-11-21: 2 [IU] via SUBCUTANEOUS
  Administered 2017-11-22 – 2017-11-23 (×2): 1 [IU] via SUBCUTANEOUS
  Administered 2017-11-23 – 2017-11-24 (×2): 3 [IU] via SUBCUTANEOUS
  Administered 2017-11-24: 1 [IU] via SUBCUTANEOUS
  Administered 2017-11-25 – 2017-11-26 (×2): 2 [IU] via SUBCUTANEOUS
  Administered 2017-11-27: 1 [IU] via SUBCUTANEOUS
  Administered 2017-11-28: 2 [IU] via SUBCUTANEOUS
  Administered 2017-11-28: 3 [IU] via SUBCUTANEOUS
  Administered 2017-11-29: 2 [IU] via SUBCUTANEOUS
  Administered 2017-11-29: 3 [IU] via SUBCUTANEOUS
  Administered 2017-11-30: 2 [IU] via SUBCUTANEOUS
  Administered 2017-11-30: 7 [IU] via SUBCUTANEOUS
  Administered 2017-12-01 – 2017-12-02 (×2): 3 [IU] via SUBCUTANEOUS
  Administered 2017-12-02: 1 [IU] via SUBCUTANEOUS

## 2017-11-18 MED ORDER — REPAGLINIDE 2 MG PO TABS
2.0000 mg | ORAL_TABLET | Freq: Two times a day (BID) | ORAL | Status: DC
  Administered 2017-11-18 – 2017-12-02 (×29): 2 mg via ORAL
  Filled 2017-11-18 (×6): qty 1
  Filled 2017-11-18: qty 14
  Filled 2017-11-18 (×17): qty 1
  Filled 2017-11-18: qty 14
  Filled 2017-11-18 (×8): qty 1

## 2017-11-18 MED ORDER — METOPROLOL TARTRATE 50 MG PO TABS
25.0000 mg | ORAL_TABLET | Freq: Two times a day (BID) | ORAL | Status: DC
  Administered 2017-11-19 – 2017-12-02 (×28): 25 mg via ORAL
  Filled 2017-11-18: qty 7
  Filled 2017-11-18 (×29): qty 1
  Filled 2017-11-18: qty 7
  Filled 2017-11-18 (×4): qty 1

## 2017-11-18 MED ORDER — INSULIN GLARGINE 100 UNIT/ML ~~LOC~~ SOLN
60.0000 [IU] | Freq: Every day | SUBCUTANEOUS | Status: DC
  Administered 2017-11-19 – 2017-12-02 (×14): 60 [IU] via SUBCUTANEOUS
  Filled 2017-11-18: qty 0.6

## 2017-11-18 NOTE — Consult Note (Addendum)
[place] Psychiatry Consult   Reason for Consult:  Bizarre behavior Referring Physician:  EDP Patient Identification: [name] [name]. MRN:  [id number] Principal Diagnosis: Schizophrenia, disorganized type [facility]) Diagnosis:   Patient Active Problem List   Diagnosis Date Noted  . DKA (diabetic ketoacidoses) ([place]) [E13.10] 11/05/2017  . Acute renal failure superimposed on stage 3 chronic kidney disease ([place]) [N17.9, N18.3] 11/05/2017  . Type II diabetes mellitus with renal manifestations ([place]) [E11.29] 11/05/2017  . Depression [F32.9] 11/05/2017  . Leukocytosis [D72.829] 11/05/2017  . Cough [R05] 11/05/2017  . Tobacco abuse [Z72.0] 11/05/2017  . Schizoaffective disorder, bipolar type ([place]) [F25.0] 06/10/2016  . DM (diabetes mellitus) type II uncontrolled, periph vascular disorder ([place]) [E11.51, E11.65] 02/29/2016  . Schizophrenia, disorganized type ([place]) [F20.1] 06/02/2015  . Hyperlipidemia [E78.5] 09/23/2014  . Renal insufficiency [N28.9] 09/23/2014  . HTN (hypertension) [I10] 03/22/2013  . Seasonal allergies [J30.2] 03/22/2013    Total Time spent with patient: 45 minutes  Subjective:   [name] [name]. is a 52 y.o. male patient admitted with bizarre behavior.  HPI:  Pt was seen and chart reviewed with treatment team and Dr Darleene Cleaver.  Pt denies suicidal/homicidal ideation, denies auditory/visual hallucinations. Pt appears to be responding to internal stimuli. Pt stated he was at his Dr and then the police took him to the hospital. Pt stated he doesn't know why he is here and denies any problems at home or with family. According to chart review pt was at his psychiatry appointment yesterday and he appeared very bizarre and his mother, who he lives with, stated he has been threatening her and to burn down the house and has not been taking his medications. Pt was placed under IVC by outpatient psychiatry due to his bizarre presentation which is abnormal for him based on  how he has presented for his appointments in the past. Pt's UDS and BAL are negative. Pt appears as disorganized, disheveled, and is lying in the bed staring at the ceiling. He has a blunt affect and minimal answers to questions.  Pt would benefit from an inpatient psychiatric admission for crisis stabilization and medication management.   Past Psychiatric History: As above  Risk to Self: Suicidal Ideation: No Suicidal Intent: No Is patient at risk for suicide?: No Suicidal Plan?: No Access to Means: No What has been your use of drugs/alcohol within the last 12 months?: THC How many times?: 0 Other Self Harm Risks: none Triggers for Past Attempts: None known Intentional Self Injurious Behavior: None Risk to Others: Homicidal Ideation: No Thoughts of Harm to Others: No Current Homicidal Intent: No Current Homicidal Plan: No Access to Homicidal Means: No Identified Victim: n/a History of harm to others?: No Assessment of Violence: None Noted Violent Behavior Description: verbal threats Does patient have access to weapons?: No Criminal Charges Pending?: No Does patient have a court date: No Prior Inpatient Therapy: Prior Inpatient Therapy: No Prior Outpatient Therapy: Prior Outpatient Therapy: Yes Prior Therapy Dates: (active) Prior Therapy Facilty/Provider(s): Dr. Adele Schilder Reason for Treatment: (psychosis) Does patient have an ACCT team?: No Does patient have Intensive In-House Services?  : No Does patient have [facility] services? : No Does patient have P4CC services?: No  Past Medical History:  Past Medical History:  Diagnosis Date  . Diabetes mellitus type II, uncontrolled ([place])   . HTN (hypertension)   . Hyperlipidemia LDL goal <70   . Schizophrenia ([place])    History reviewed. No pertinent surgical history. Family History:  Family History  Problem Relation  Age of Onset  . Breast cancer Mother   . Diabetes Mother   . Prostate cancer Father        StepFather  .  Hypertension Father   . Breast cancer Maternal Aunt   . Diabetes Maternal Uncle    Family Psychiatric  History: Unknown Social History:  Social History   Substance and Sexual Activity  Alcohol Use Yes   Comment: occasional     Social History   Substance and Sexual Activity  Drug Use Yes  . Types: Marijuana    Social History   Socioeconomic History  . Marital status: Single    Spouse name: Not on file  . Number of children: Not on file  . Years of education: Not on file  . Highest education level: Not on file  Occupational History  . Not on file  Social Needs  . Financial resource strain: Not on file  . Food insecurity:    Worry: Not on file    Inability: Not on file  . Transportation needs:    Medical: Not on file    Non-medical: Not on file  Tobacco Use  . Smoking status: Current Every Day Smoker    Packs/day: 2.00    Years: 30.00    Pack years: 60.00    Types: Cigarettes, Cigars  . Smokeless tobacco: Never Used  Substance and Sexual Activity  . Alcohol use: Yes    Comment: occasional  . Drug use: Yes    Types: Marijuana  . Sexual activity: Never  Lifestyle  . Physical activity:    Days per week: Not on file    Minutes per session: Not on file  . Stress: Not on file  Relationships  . Social connections:    Talks on phone: Not on file    Gets together: Not on file    Attends religious service: Not on file    Active member of club or organization: Not on file    Attends meetings of clubs or organizations: Not on file    Relationship status: Not on file  Other Topics Concern  . Not on file  Social History Narrative   Lives with mom   unemployed   No children   Additional Social History:    Allergies:  No Known Allergies  Labs:  Results for orders placed or performed during the hospital encounter of 11/17/17 (from the past 48 hour(s))  Comprehensive metabolic panel     Status: Abnormal   Collection Time: 11/17/17  2:32 PM  Result Value Ref  Range   Sodium 139 135 - 145 mmol/L   Potassium 3.9 3.5 - 5.1 mmol/L   Chloride 108 98 - 111 mmol/L   CO2 22 22 - 32 mmol/L   Glucose, Bld 95 70 - 99 mg/dL   BUN 12 6 - 20 mg/dL   Creatinine, Ser 1.73 (H) 0.61 - 1.24 mg/dL   Calcium 9.4 8.9 - 10.3 mg/dL   Total Protein 7.3 6.5 - 8.1 g/dL   Albumin 3.7 3.5 - 5.0 g/dL   AST 152 (H) 15 - 41 U/L   ALT 145 (H) 0 - 44 U/L   Alkaline Phosphatase 88 38 - 126 U/L   Total Bilirubin 0.5 0.3 - 1.2 mg/dL   GFR calc non Af Amer 44 (L) >60 mL/min   GFR calc Af Amer 51 (L) >60 mL/min    Comment: (NOTE) The eGFR has been calculated using the CKD EPI equation. This calculation has not been validated  in all clinical situations. eGFR's persistently <60 mL/min signify possible Chronic Kidney Disease.    Anion gap 9 5 - 15    Comment: Performed at [facility], [place] [address] [place], [place] [address]  Ethanol     Status: None   Collection Time: 11/17/17  2:32 PM  Result Value Ref Range   Alcohol, Ethyl (B) <10 <10 mg/dL    Comment: (NOTE) Lowest detectable limit for serum alcohol is 10 mg/dL. For medical purposes only. Performed at [facility], [place] [address], [place], [place] [address]   CBC with Diff     Status: Abnormal   Collection Time: 11/17/17  2:32 PM  Result Value Ref Range   WBC 8.8 4.0 - 10.5 K/uL   RBC 4.75 4.22 - 5.81 MIL/uL   Hemoglobin 14.2 13.0 - 17.0 g/dL   HCT 41.4 39.0 - 52.0 %   MCV 87.2 78.0 - 100.0 fL   MCH 29.9 26.0 - 34.0 pg   MCHC 34.3 30.0 - 36.0 g/dL   RDW 13.7 11.5 - 15.5 %   Platelets 521 (H) 150 - 400 K/uL   Neutrophils Relative % 70 %   Neutro Abs 6.2 1.7 - 7.7 K/uL   Lymphocytes Relative 20 %   Lymphs Abs 1.8 0.7 - 4.0 K/uL   Monocytes Relative 8 %   Monocytes Absolute 0.7 0.1 - 1.0 K/uL   Eosinophils Relative 2 %   Eosinophils Absolute 0.2 0.0 - 0.7 K/uL   Basophils Relative 0 %   Basophils Absolute 0.0 0.0 - 0.1 K/uL    Comment: Performed at [facility], [place] [address], [place], [name] [address]  Acetaminophen level     Status: Abnormal   Collection Time: 11/17/17  2:32 PM  Result Value Ref Range   Acetaminophen (Tylenol), Serum <10 (L) 10 - 30 ug/mL    Comment: (NOTE) Therapeutic concentrations vary significantly. A range of 10-30 ug/mL  may be an effective concentration for many patients. However, some  are best treated at concentrations outside of this range. Acetaminophen concentrations >150 ug/mL at 4 hours after ingestion  and >50 ug/mL at 12 hours after ingestion are often associated with  toxic reactions. Performed at [facility], [place] [address], [place], [place] [address]   Salicylate level     Status: None   Collection Time: 11/17/17  2:32 PM  Result Value Ref Range   Salicylate Lvl <2.1 2.8 - 30.0 mg/dL    Comment: Performed at [facility], [place] [address] [place], [place] [address]  Urine rapid drug screen (hosp performed)     Status: None   Collection Time: 11/18/17  3:24 AM  Result Value Ref Range   Opiates NONE DETECTED NONE DETECTED   Cocaine NONE DETECTED NONE DETECTED   Benzodiazepines NONE DETECTED NONE DETECTED   Amphetamines NONE DETECTED NONE DETECTED   Tetrahydrocannabinol NONE DETECTED NONE DETECTED   Barbiturates NONE DETECTED NONE DETECTED    Comment: (NOTE) DRUG SCREEN FOR MEDICAL PURPOSES ONLY.  IF CONFIRMATION IS NEEDED FOR ANY PURPOSE, NOTIFY LAB WITHIN 5 DAYS. LOWEST DETECTABLE LIMITS FOR URINE DRUG SCREEN Drug Class                     Cutoff (ng/mL) Amphetamine and metabolites    1000 Barbiturate and metabolites    200 Benzodiazepine                 200  Tricyclics and metabolites     300 Opiates and metabolites        300 Cocaine and metabolites        300 THC                            50 Performed at [facility], [place] [address], [place], [place] [address]   CBG monitoring, ED     Status: Abnormal    Collection Time: 11/18/17  8:27 AM  Result Value Ref Range   Glucose-Capillary 143 (H) 70 - 99 mg/dL   Comment 1 Notify RN     Current Facility-Administered Medications  Medication Dose Route Frequency Provider Last Rate Last Dose  . albuterol (PROVENTIL HFA;VENTOLIN HFA) 108 (90 Base) MCG/ACT inhaler 1-2 puff  1-2 puff Inhalation Q6H Couture, Cortni S, PA-C   2 puff at 11/18/17 0829  . aspirin EC tablet 81 mg  81 mg Oral Daily Couture, Cortni S, PA-C   81 mg at 11/18/17 1017  . insulin aspart (novoLOG) injection 0-9 Units  0-9 Units Subcutaneous TID WC Varney Biles, MD   1 Units at 11/18/17 0849  . insulin glargine (LANTUS) injection 60 Units  60 Units Subcutaneous Daily Couture, Cortni S, PA-C   60 Units at 11/18/17 1017  . ipratropium-albuterol (DUONEB) 0.5-2.5 (3) MG/3ML nebulizer solution 3 mL  3 mL Nebulization Once Couture, Cortni S, PA-C      . loratadine (CLARITIN) tablet 10 mg  10 mg Oral Daily Couture, Cortni S, PA-C   10 mg at 11/18/17 1016  . metoprolol tartrate (LOPRESSOR) tablet 25 mg  25 mg Oral BID Couture, Cortni S, PA-C   25 mg at 11/18/17 1017  . mirtazapine (REMERON) tablet 30 mg  30 mg Oral QHS Ethelene Hal, NP   30 mg at 11/17/17 2132  . mometasone-formoterol (DULERA) 200-5 MCG/ACT inhaler 2 puff  2 puff Inhalation BID Couture, Cortni S, PA-C   2 puff at 11/18/17 0830  . nicotine (NICODERM CQ - dosed in mg/24 hours) patch 14 mg  14 mg Transdermal Once Dorie Rank, MD   14 mg at 11/17/17 1720  . repaglinide (PRANDIN) tablet 2 mg  2 mg Oral BID AC Couture, Cortni S, PA-C   2 mg at 11/18/17 0831  . risperiDONE (RISPERDAL) tablet 2 mg  2 mg Oral Q breakfast Ethelene Hal, NP   2 mg at 11/18/17 0830   And  . risperiDONE (RISPERDAL) tablet 4 mg  4 mg Oral QHS Ethelene Hal, NP   4 mg at 11/17/17 2133  . simvastatin (ZOCOR) tablet 40 mg  40 mg Oral QHS Couture, Cortni S, PA-C   40 mg at 11/17/17 2132  . traZODone (DESYREL) tablet 100 mg  100 mg Oral  QHS Ethelene Hal, NP   100 mg at 11/17/17 2131  . [START ON 11/21/2017] Vitamin D (Ergocalciferol) (DRISDOL) capsule 50,000 Units  50,000 Units Oral Weekly Couture, Cortni S, PA-C       Current Outpatient Medications  Medication Sig Dispense Refill  . ALLERGY RELIEF 10 MG tablet TAKE 1 TABLET BY MOUTH EVERY DAY (Patient taking differently: Take 10 mg by mouth daily as needed for allergies. ) 30 tablet 0  . aspirin EC 81 MG tablet Take 1 tablet (81 mg total) by mouth daily.    . Fluticasone-Salmeterol (ADVAIR DISKUS) 250-50 MCG/DOSE AEPB Inhale 1 puff into the lungs 2 (two) times daily. (Patient  taking differently: Inhale 1 puff into the lungs 2 (two) times daily as needed (wheezing). ) 1 each 3  . insulin glargine (LANTUS) 100 UNIT/ML injection Inject 0.6 mLs (60 Units total) into the skin daily. 10 mL 11  . metoprolol tartrate (LOPRESSOR) 25 MG tablet TAKE 1 TABLET BY MOUTH TWICE A DAY (Patient taking differently: Take 25 mg by mouth 2 (two) times daily. ) 180 tablet 1  . mirtazapine (REMERON) 30 MG tablet Take 1 tablet (30 mg total) by mouth at bedtime. 30 tablet 2  . Multiple Vitamin (MULTIVITAMIN WITH MINERALS) TABS tablet Take 1 tablet by mouth daily.    . repaglinide (PRANDIN) 2 MG tablet Take 1 tablet (2 mg total) by mouth 3 (three) times daily before meals. (Patient taking differently: Take 2 mg by mouth 2 (two) times daily before a meal. ) 90 tablet 1  . risperiDONE (RISPERDAL) 2 MG tablet TAKE 1 TABLET IN THE MORNING AND TAKE 2 TABLETS AT BEDTIME (Patient taking differently: Take 2-4 mg by mouth See admin instructions. TAKE 1 TABLET IN THE MORNING AND TAKE 2 TABLETS AT BEDTIME) 90 tablet 2  . simvastatin (ZOCOR) 40 MG tablet TAKE 1 TABLET BY MOUTH EVERY DAY (Patient taking differently: Take 40 mg by mouth daily. ) 90 tablet 1  . traZODone (DESYREL) 100 MG tablet Take 1 tablet (100 mg total) by mouth at bedtime. 30 tablet 2  . Vitamin D, Ergocalciferol, (DRISDOL) 50000 units CAPS  capsule Take 1 capsule by mouth once a week. On Monday  2  . blood glucose meter kit and supplies KIT Dispense based on patient and insurance preference. Use up to four times daily as directed. (FOR ICD-9 250.00, 250.01). 1 each 0  . Insulin Pen Needle 32G X 6 MM MISC 1 Units by Does not apply route every morning. 90 each 1    Musculoskeletal: Strength & Muscle Tone: within normal limits Gait & Station: normal Patient leans: N/A  Psychiatric Specialty Exam: Physical Exam  Constitutional: He is oriented to person, place, and time. He appears well-developed and well-nourished.  HENT:  Head: Normocephalic.  Respiratory: Effort normal.  Musculoskeletal: Normal range of motion.  Neurological: He is alert and oriented to person, place, and time.  Psychiatric: His speech is normal. He is slowed. Thought content is delusional. Thought content is not paranoid. Cognition and memory are normal. He expresses impulsivity. He exhibits a depressed mood.    Review of Systems  Psychiatric/Behavioral: Positive for depression. Negative for hallucinations, memory loss, substance abuse and suicidal ideas. The patient is not nervous/anxious and does not have insomnia.   All other systems reviewed and are negative.   Blood pressure 121/82, pulse (!) 102, temperature 98 F (36.7 C), temperature source Oral, resp. rate 18, height 5' 9"  (1.753 m), weight 97.5 kg, SpO2 97 %.Body mass index is 31.75 kg/m.  General Appearance: Disheveled  Eye Contact:  Fair  Speech:  Blocked  Volume:  Decreased  Mood:  Depressed  Affect:  Blunt and Depressed  Thought Process:  Disorganized  Orientation:  Full (Time, Place, and Person)  Thought Content:  Illogical and Delusions  Suicidal Thoughts:  No  Homicidal Thoughts:  No  Memory:  Immediate;   Fair Recent;   Fair Remote;   Fair  Judgement:  Impaired  Insight:  Lacking  Psychomotor Activity:  Decreased  Concentration:  Concentration: Fair and Attention Span: Fair   Recall:  Poor  Fund of Knowledge:  Fair  Language:  Good  Akathisia:  No  Handed:  Right  AIMS (if indicated):     Assets:  Agricultural consultant Housing  ADL's:  Intact  Cognition:  WNL  Sleep:        Treatment Plan Summary: Daily contact with patient to assess and evaluate symptoms and progress in treatment and Medication management (see MAR )  Disposition: Recommend psychiatric Inpatient admission when medically cleared. TTS to seek placement  Ethelene Hal, NP 11/18/2017 11:47 AM  Patient seen face-to-face for psychiatric evaluation, chart reviewed and case discussed with the physician extender and developed treatment plan. Reviewed the information documented and agree with the treatment plan. Corena Pilgrim, MD

## 2017-11-18 NOTE — BH Assessment (Signed)
Admission Note: Patient is an 52 year old male who is admitted to the unit for bizarre behavior.  Presents with anxious affect and mood.  Patient is disorganized with tangential thoughts process.  Doesn't know what brought him to the hospital.  States he is here because the police brought him here.  Denies suicidal thoughts, auditory and visual hallucinations.  Admission plan of care reviewed and consent signed.skin assessment and personal belongings searched.  Skin is dry and intact.  No contraband found.  Patient oriented to the unit, staff and room.  Routine safety checks initiated.  Patient is safe on and off the unit.

## 2017-11-18 NOTE — BH Assessment (Addendum)
[place] Assessment Progress Note  Per Corena Pilgrim, MD, this pt requires psychiatric hospitalization.  [name], RN, [facility] has assigned pt to [facility] Rm 506-2.  Pt presents under IVC initiated by Charlcie Cradle, MD, and IVC documents have been faxed to [facility].  Pt's nurse, Langley Gauss, has been notified, and agrees to call report to [phone].  Pt is to be transported via Event organiser.  At 14:27 this writer called pt's mother/legal guardian, Barnell Shieh [phone]), notifying her of pt's disposition.  Guardianship documents have been faxed to [facility], along with IVC documents.   Jalene Mullet, [place] Coordinator [phone]

## 2017-11-18 NOTE — ED Notes (Signed)
Patient denies pain and is resting comfortably.  

## 2017-11-18 NOTE — Tx Team (Signed)
Initial Treatment Plan 11/18/2017 5:39 PM [name] [name]. MMN:[id number]    PATIENT STRESSORS: Financial difficulties Health problems Medication change or noncompliance   PATIENT STRENGTHS: Motivation for treatment/growth Supportive family/friends   PATIENT IDENTIFIED PROBLEMS: "coping skills"  Ineffective coping skills  Psychosis  Anxiety               DISCHARGE CRITERIA:  Adequate post-discharge living arrangements Motivation to continue treatment in a less acute level of care  PRELIMINARY DISCHARGE PLAN: Attend aftercare/continuing care group Outpatient therapy Return to previous living arrangement  PATIENT/FAMILY INVOLVEMENT: This treatment plan has been presented to and reviewed with the patient, [name] Markson., and/or family member.  The patient and family have been given the opportunity to ask questions and make suggestions.  Vela Prose, RN 11/18/2017, 5:39 PM

## 2017-11-19 LAB — GLUCOSE, CAPILLARY
Glucose-Capillary: 116 mg/dL — ABNORMAL HIGH (ref 70–99)
Glucose-Capillary: 147 mg/dL — ABNORMAL HIGH (ref 70–99)
Glucose-Capillary: 106 mg/dL — ABNORMAL HIGH (ref 70–99)
Glucose-Capillary: 126 mg/dL — ABNORMAL HIGH (ref 70–99)

## 2017-11-19 LAB — TSH: TSH: 1.855 u[IU]/mL (ref 0.350–4.500)

## 2017-11-19 MED ORDER — MIRTAZAPINE 7.5 MG PO TABS
7.5000 mg | ORAL_TABLET | Freq: Every day | ORAL | Status: DC
  Administered 2017-11-19 – 2017-12-01 (×13): 7.5 mg via ORAL
  Filled 2017-11-19 (×6): qty 1
  Filled 2017-11-19: qty 7
  Filled 2017-11-19 (×8): qty 1

## 2017-11-19 MED ORDER — RISPERIDONE 1 MG PO TABS
1.0000 mg | ORAL_TABLET | Freq: Every day | ORAL | Status: DC
  Administered 2017-11-20: 1 mg via ORAL
  Filled 2017-11-19 (×3): qty 1

## 2017-11-19 MED ORDER — NICOTINE POLACRILEX 2 MG MT GUM
2.0000 mg | CHEWING_GUM | OROMUCOSAL | Status: DC | PRN
  Administered 2017-11-19 – 2017-12-02 (×29): 2 mg via ORAL
  Filled 2017-11-19 (×3): qty 1

## 2017-11-19 MED ORDER — RISPERIDONE 2 MG PO TABS
2.0000 mg | ORAL_TABLET | Freq: Every day | ORAL | Status: DC
  Administered 2017-11-19: 2 mg via ORAL
  Filled 2017-11-19 (×3): qty 1

## 2017-11-19 NOTE — H&P (Addendum)
Psychiatric Admission Assessment Adult  Patient Identification: [name] [name]. MRN:  [id number] Date of Evaluation:  11/19/2017 Chief Complaint:  SCHIZOPHRENIA; DISORGANIZED Principal Diagnosis: Schizophrenia, paranoid type ([place]) Diagnosis:   Patient Active Problem List   Diagnosis Date Noted  . Schizophrenia, paranoid type ([place]) [F20.0] 11/18/2017  . DKA (diabetic ketoacidoses) ([place]) [E13.10] 11/05/2017  . Acute renal failure superimposed on stage 3 chronic kidney disease ([place]) [N17.9, N18.3] 11/05/2017  . Type II diabetes mellitus with renal manifestations ([place]) [E11.29] 11/05/2017  . Depression [F32.9] 11/05/2017  . Leukocytosis [D72.829] 11/05/2017  . Cough [R05] 11/05/2017  . Tobacco abuse [Z72.0] 11/05/2017  . Schizoaffective disorder, bipolar type ([place]) [F25.0] 06/10/2016  . DM (diabetes mellitus) type II uncontrolled, periph vascular disorder ([place]) [E11.51, E11.65] 02/29/2016  . Schizophrenia, disorganized type ([place]) [F20.1] 06/02/2015  . Hyperlipidemia [E78.5] 09/23/2014  . Renal insufficiency [N28.9] 09/23/2014  . HTN (hypertension) [I10] 03/22/2013  . Seasonal allergies [J30.2] 03/22/2013   History of Present Illness: Per Dr. Doyne Keel on 11/17/17:  Per chart review patient went to [facility] ED yesterday after getting into an argument with his mother and exhibiting some bizarre behavior. He later denied any psychosis or SI/HI. He was discharged home and scheduled a follow-up with me today. He usually sees Dr. Adele Schilder for psychiatric treatment.  Patient reports that he hears multiple voices inside of his head that talk to him all the time. He was unable to state what the content of the voices were. He was actively responding to the voices today. He is paranoid and stated that his mother is an impostor. He stated that someone who looked like his motherinvited him out to dinner for his birthday but instead took him to the emergency room yesterday. He made some  threatening statements about the individual who looks like his mother-stating that he was going to"do what I have to do"and that the police were with him. And when asked to clarify he stated "I said what I said."  Patient here today with his mother and his friend.Mom reports no hx of violencebut states that over the last 1 week he has been acting bizarre. He screamed at them in a grocery store last week which is very unusual for him. Yesterday he threatened to burn down the house. Last night he locked all the doors and she felt unsafe being around him. She states that he has never threatened any sort of violence in the past. She feels he is a danger to himself and others and is not comfortable with him in the house at this point in time. A friend who attended today's appointment also confirmed the statements and stated that pt is compliant with his meds.  Per TTS Assessment by D.Dubose 11/15/17: [name] Vaughnis an 52 y.o.malep[facility] accompanied by his mother with complaints of bizarre behavior and auditory hallucinations. Per report from patient's mother she feels patient is having a psychotic break. Patient is a poor historian and speaks in a tangential language and appears to be delusional. Patient also displayed thought blocking. Patient denies suicidal / homicidal ideations and denies experiencing auditory / visual hallucinations. Patient report he taking his medication as prescribed but patient's mother feels patient is not taking his medication. Patient was last seen in the ED 08/09/2017 for bizarre behavior and last seen for an assessment 3.28.2019. Patient psychiatrist is Dr. Adele Schilder.  On evaluation  to inpatient unit:  [name] [name] is awake alert and oriented x3.  Reports previous inpatient admission.  States he was at the hospital waiting to be transferred over here.  Unsure why he was admitted.  Reports taking medication as prescribed states he is followed by  Dr. Adele Schilder outpatient.  Denies substance abuse use or alcohol.  Patient can be difficult to understand due to missing teeth.  Slightly disorganized with previous diagnoses and medications.  Denies suicidal or homicidal ideations.  Reports auditory hallucinations that are intermittent. Denies paranoia ideations.  Support encouragement reassurance was provided   Associated Signs/Symptoms: Depression Symptoms:  difficulty concentrating, (Hypo) Manic Symptoms:  Distractibility, Irritable Mood, Labiality of Mood, Anxiety Symptoms:  Excessive Worry, Psychotic Symptoms:  Hallucinations: Auditory PTSD Symptoms: NA Total Time spent with patient: 20 minutes  Past Psychiatric History:  See chart   Is the patient at risk to self? No.  Has the patient been a risk to self in the past 6 months? No.  Has the patient been a risk to self within the distant past? No.  Is the patient a risk to others? No.  Has the patient been a risk to others in the past 6 months? No.  Has the patient been a risk to others within the distant past? No.   Prior Inpatient Therapy:   Prior Outpatient Therapy:    Alcohol Screening: Patient refused Alcohol Screening Tool: Yes 1. How often do you have a drink containing alcohol?: Never 3. How often do you have six or more drinks on one occasion?: Never 4. How often during the last year have you found that you were not able to stop drinking once you had started?: Never 5. How often during the last year have you failed to do what was normally expected from you becasue of drinking?: Never 6. How often during the last year have you needed a first drink in the morning to get yourself going after a heavy drinking session?: Never 7. How often during the last year have you had a feeling of guilt of remorse after drinking?: Never 8. How often during the last year have you been unable to remember what happened the night before because you had been drinking?: Never 9. Have you or  someone else been injured as a result of your drinking?: No 10. Has a relative or friend or a doctor or another health worker been concerned about your drinking or suggested you cut down?: No Intervention/Follow-up: Patient Refused Substance Abuse History in the last 12 months:  No. Consequences of Substance Abuse: NA Previous Psychotropic Medications: Yes  Psychological Evaluations: Yes  Past Medical History:  Past Medical History:  Diagnosis Date  . Diabetes mellitus type II, uncontrolled ([place])   . HTN (hypertension)   . Hyperlipidemia LDL goal <70   . Schizophrenia ([place])    History reviewed. No pertinent surgical history. Family History:  Family History  Problem Relation Age of Onset  . Breast cancer Mother   . Diabetes Mother   . Prostate cancer Father        StepFather  . Hypertension Father   . Breast cancer Maternal Aunt   . Diabetes Maternal Uncle    Family Psychiatric  History:  Tobacco Screening: Have you used any form of tobacco in the last 30 days? (Cigarettes, Smokeless Tobacco, Cigars, and/or Pipes): Yes Tobacco use, Select all that apply: 5 or more cigarettes per day Are you interested in Tobacco Cessation Medications?: Yes, will notify MD for an order Counseled patient on smoking cessation including recognizing danger situations, developing coping skills and basic information about quitting provided:  Yes Social History:  Social History   Substance and Sexual Activity  Alcohol Use Yes   Comment: occasional     Social History   Substance and Sexual Activity  Drug Use Yes  . Types: Marijuana    Additional Social History:                           Allergies:  No Known Allergies Lab Results:  Results for orders placed or performed during the hospital encounter of 11/18/17 (from the past 48 hour(s))  Glucose, capillary     Status: None   Collection Time: 11/18/17  5:04 PM  Result Value Ref Range   Glucose-Capillary 88 70 - 99 mg/dL  Glucose,  capillary     Status: Abnormal   Collection Time: 11/18/17  9:25 PM  Result Value Ref Range   Glucose-Capillary 143 (H) 70 - 99 mg/dL  Glucose, capillary     Status: Abnormal   Collection Time: 11/19/17  5:36 AM  Result Value Ref Range   Glucose-Capillary 116 (H) 70 - 99 mg/dL    Blood Alcohol level:  Lab Results  Component Value Date   ETH <10 11/17/2017   ETH <10 56/38/9373    Metabolic Disorder Labs:  Lab Results  Component Value Date   HGBA1C 11.3 (H) 11/06/2017   MPG 278 11/06/2017   MPG 151 09/30/2017   No results found for: PROLACTIN Lab Results  Component Value Date   CHOL 151 09/30/2017   TRIG 345 (H) 09/30/2017   HDL 43 09/30/2017   CHOLHDL 3.5 09/30/2017   VLDL 33.2 01/24/2017   LDLCALC 66 09/30/2017   LDLCALC 70 01/24/2017    Current Medications: Current Facility-Administered Medications  Medication Dose Route Frequency Provider Last Rate Last Dose  . acetaminophen (TYLENOL) tablet 650 mg  650 mg Oral Q6H PRN Ethelene Hal, NP      . alum & mag hydroxide-simeth (MAALOX/MYLANTA) 200-200-20 MG/5ML suspension 30 mL  30 mL Oral Q4H PRN Ethelene Hal, NP      . hydrOXYzine (ATARAX/VISTARIL) tablet 25 mg  25 mg Oral TID PRN Ethelene Hal, NP   25 mg at 11/19/17 0321  . insulin aspart (novoLOG) injection 0-9 Units  0-9 Units Subcutaneous TID WC Ethelene Hal, NP      . insulin glargine (LANTUS) injection 60 Units  60 Units Subcutaneous Daily Ethelene Hal, NP   60 Units at 11/19/17 0951  . loratadine (CLARITIN) tablet 10 mg  10 mg Oral Daily Ethelene Hal, NP   10 mg at 11/19/17 0951  . magnesium hydroxide (MILK OF MAGNESIA) suspension 30 mL  30 mL Oral Daily PRN Ethelene Hal, NP      . metoprolol tartrate (LOPRESSOR) tablet 25 mg  25 mg Oral BID Ethelene Hal, NP   25 mg at 11/19/17 0954  . mirtazapine (REMERON) tablet 30 mg  30 mg Oral QHS Ethelene Hal, NP   30 mg at 11/18/17 2100  .  mometasone-formoterol (DULERA) 200-5 MCG/ACT inhaler 2 puff  2 puff Inhalation BID Ethelene Hal, NP   2 puff at 11/19/17 0954  . nicotine polacrilex (NICORETTE) gum 2 mg  2 mg Oral PRN Derrill Center, NP   2 mg at 11/19/17 1008  . repaglinide (PRANDIN) tablet 2 mg  2 mg Oral BID AC Ethelene Hal, NP   2 mg at 11/19/17 0950  . risperiDONE (RISPERDAL) tablet 2  mg  2 mg Oral Q breakfast Ethelene Hal, NP   2 mg at 11/19/17 1962   And  . risperiDONE (RISPERDAL) tablet 4 mg  4 mg Oral QHS Ethelene Hal, NP   4 mg at 11/18/17 2100  . simvastatin (ZOCOR) tablet 40 mg  40 mg Oral QHS Ethelene Hal, NP   40 mg at 11/18/17 2100  . traZODone (DESYREL) tablet 100 mg  100 mg Oral QHS Ethelene Hal, NP   100 mg at 11/18/17 2100  . traZODone (DESYREL) tablet 50 mg  50 mg Oral QHS PRN Ethelene Hal, NP   50 mg at 11/18/17 2304  . [START ON 11/21/2017] Vitamin D (Ergocalciferol) (DRISDOL) capsule 50,000 Units  50,000 Units Oral Weekly Ethelene Hal, NP       PTA Medications: Medications Prior to Admission  Medication Sig Dispense Refill Last Dose  . ALLERGY RELIEF 10 MG tablet TAKE 1 TABLET BY MOUTH EVERY DAY (Patient taking differently: Take 10 mg by mouth daily as needed for allergies. ) 30 tablet 0 Past Month at Unknown time  . aspirin EC 81 MG tablet Take 1 tablet (81 mg total) by mouth daily.   11/17/2017 at Unknown time  . blood glucose meter kit and supplies KIT Dispense based on patient and insurance preference. Use up to four times daily as directed. (FOR ICD-9 250.00, 250.01). 1 each 0 Taking  . Fluticasone-Salmeterol (ADVAIR DISKUS) 250-50 MCG/DOSE AEPB Inhale 1 puff into the lungs 2 (two) times daily. (Patient taking differently: Inhale 1 puff into the lungs 2 (two) times daily as needed (wheezing). ) 1 each 3 Past Month at Unknown time  . insulin glargine (LANTUS) 100 UNIT/ML injection Inject 0.6 mLs (60 Units total) into the skin daily. 10  mL 11 11/17/2017 at Unknown time  . Insulin Pen Needle 32G X 6 MM MISC 1 Units by Does not apply route every morning. 90 each 1 Taking  . metoprolol tartrate (LOPRESSOR) 25 MG tablet TAKE 1 TABLET BY MOUTH TWICE A DAY (Patient taking differently: Take 25 mg by mouth 2 (two) times daily. ) 180 tablet 1 11/17/2017 at 1100  . mirtazapine (REMERON) 30 MG tablet Take 1 tablet (30 mg total) by mouth at bedtime. 30 tablet 2 11/16/2017 at Unknown time  . Multiple Vitamin (MULTIVITAMIN WITH MINERALS) TABS tablet Take 1 tablet by mouth daily.   11/17/2017 at Unknown time  . repaglinide (PRANDIN) 2 MG tablet Take 1 tablet (2 mg total) by mouth 3 (three) times daily before meals. (Patient taking differently: Take 2 mg by mouth 2 (two) times daily before a meal. ) 90 tablet 1 11/17/2017 at Unknown time  . risperiDONE (RISPERDAL) 2 MG tablet TAKE 1 TABLET IN THE MORNING AND TAKE 2 TABLETS AT BEDTIME (Patient taking differently: Take 2-4 mg by mouth See admin instructions. TAKE 1 TABLET IN THE MORNING AND TAKE 2 TABLETS AT BEDTIME) 90 tablet 2 11/17/2017 at Unknown time  . simvastatin (ZOCOR) 40 MG tablet TAKE 1 TABLET BY MOUTH EVERY DAY (Patient taking differently: Take 40 mg by mouth daily. ) 90 tablet 1 11/16/2017 at Unknown time  . traZODone (DESYREL) 100 MG tablet Take 1 tablet (100 mg total) by mouth at bedtime. 30 tablet 2 11/16/2017 at Unknown time  . Vitamin D, Ergocalciferol, (DRISDOL) 50000 units CAPS capsule Take 1 capsule by mouth once a week. On Monday  2 11/14/2017    Musculoskeletal: Strength & Muscle Tone: within normal limits Gait &  Station: normal Patient leans: N/A  Psychiatric Specialty Exam: Physical Exam  Vitals reviewed. Constitutional: He appears well-developed.  Psychiatric: He has a normal mood and affect. His behavior is normal.    Review of Systems  Constitutional: Negative for fever.  Psychiatric/Behavioral: Negative for depression, hallucinations (intermittent currently denying), substance  abuse and suicidal ideas. The patient is not nervous/anxious.   All other systems reviewed and are negative.   Blood pressure 105/74, pulse (!) 117, temperature 98.6 F (37 C), temperature source Oral, resp. rate 18, height 5' 10"  (1.778 m), weight 108 kg, SpO2 96 %.Body mass index is 34.15 kg/m.  General Appearance: Casual and Guarded  Eye Contact:  Fair  Speech:  Clear and Coherent  Volume:  Normal  Mood:  Anxious  Affect:  Congruent  Thought Process:  Coherent and Linear  Orientation:  Full (Time, Place, and Person)  Thought Content:  Hallucinations: Auditory intermittent currently denying  Suicidal Thoughts:  No  Homicidal Thoughts:  No  Memory:  Immediate;   Poor Recent;   Poor Remote;   Poor  Judgement:  Fair  Insight:  Fair  Psychomotor Activity:  Normal  Concentration:  Concentration: Fair  Recall:  [name] of Knowledge:  Fair  Language:  Fair  Akathisia:  No  Handed:  Right  AIMS (if indicated):     Assets:  Communication Skills Desire for Improvement Resilience Social Support Talents/Skills  ADL's:  Intact  Cognition:  WNL  Sleep:       Treatment Plan Summary: Daily contact with patient to assess and evaluate symptoms and progress in treatment and Medication management  Observation Level/Precautions:  15 minute checks  Laboratory:  CBC Chemistry Profile UDS UA  Psychotherapy:  Individual and group session  Medications:  See MAR   Consultations:  CSW and Psychiatry   Discharge Concerns:  Safety, stabilization, and risk of access to medication and medication stabilization   Estimated LOS: 3-5 days  Other:     Physician Treatment Plan for Primary Diagnosis: Schizophrenia, paranoid type ([place]) Long Term Goal(s): Improvement in symptoms so as ready for discharge  Short Term Goals: Ability to identify changes in lifestyle to reduce recurrence of condition will improve, Ability to disclose and discuss suicidal ideas, Ability to demonstrate self-control  will improve and Ability to identify and develop effective coping behaviors will improve  Physician Treatment Plan for Secondary Diagnosis: Principal Problem:   Schizophrenia, paranoid type ([place])  Long Term Goal(s): Improvement in symptoms so as ready for discharge  Short Term Goals: Ability to identify changes in lifestyle to reduce recurrence of condition will improve, Ability to verbalize feelings will improve, Ability to disclose and discuss suicidal ideas and Ability to demonstrate self-control will improve  I certify that inpatient services furnished can reasonably be expected to improve the patient's condition.    Derrill Center, NP 9/7/201910:25 AM   I have discussed case with NP and have met with patient  Agree with NP note and assessment  39 , unemployed, has two children ( adults ), lives with mother. Patient is fair historian and states " I don't know why I am in the hospital, somebody did something but I did not do it" As per chart, patient went to scheduled medical appointment and presented bizarre, internally preoccupied. His mother reported that he had not been taking his prescribed medications and had been making threats to burn down the house. As per chart patient has history of Schizophrenia, with history of prior psychiatric admissions.  He was following up with Dr. Adele Schilder for outpatient psychiatric management and was being treated with Risperidone ( 2 mgr QAM and 4 mgrs QHS) , Trazodone, Remeron.  He denies alcohol or drug abuse and admission BAL is negative, admission UDS negative. Medical History remarkable for HTN, DM ( Lipid panel and HgbA1C done in July 2019). Creatinine 1.73. ( has been chronically elevated and has trended down from prior ) 8/24 EKG NSR , QTc 416  Dx- Schizophrenia  Plan- Inpatient admission Has been restarted on medications ( Risperidone, Remeron) - will adjust Risperidone to 1 mgr QAM and 2 mgr QHS , Remeron to 7.5  mgr QHS. Titrate as  tolerated  Has been continued on  anti-hypertensive and diabetic management Diabetes Care coordinator consult requested

## 2017-11-19 NOTE — [facility] Suicide Risk Assessment (Signed)
[place] INPATIENT:  Family/Significant Other Suicide Prevention Education  Suicide Prevention Education:  Education Completed; [name] [name] [phone] has been identified by the patient as the family member/significant other with whom the patient will be residing, and identified as the person(s) who will aid the patient in the event of a mental health crisis (suicidal ideations/suicide attempt).    There was no suicidal ideation at the time of admission.  Mother was contacted for collateral information.  [name] [name] 11/19/2017, 5:06 PM

## 2017-11-19 NOTE — [facility] Counselor (Signed)
Clinical Social Work Note  Collateral information was sought from patient's mother Messiah Ahr [phone].  She was initially listed as his legal guardian, but that is not the case.  She started a petition years ago, but it was never adjudicated.  Mother is unsure of the exact nature of patient's checks and insurance.  She believes he was on disability until his father died 73 years ago, then he started getting his father's social security.  He has Medicaid and [employer].    She states that when he was discharged from the last hospitalization, she did not believe he had been there long enough so she got a family friend to help her.  She talks as though this was recent, but when asked states it must have been 4 years ago.  She states that patient has never gotten violent or aggressive with her, but he did recently with the family friend who was helping them at the grocery store.    Mother states that at this time patient cannot return home to live with her due to safety concerns, and placement is to be sought.  Selmer Dominion, LCSW 11/19/2017, 5:24 PM

## 2017-11-19 NOTE — Progress Notes (Signed)
Inpatient Diabetes Program Recommendations  AACE/ADA: New Consensus Statement on Inpatient Glycemic Control (2015)  Target Ranges:  Prepandial:   less than 140 mg/dL      Peak postprandial:   less than 180 mg/dL (1-2 hours)      Critically ill patients:  140 - 180 mg/dL   Results for [name] [name], [name] [name] (MRN [id number]) as of 11/19/2017 14:23  Ref. Range 11/18/2017 08:27 11/18/2017 12:24 11/18/2017 17:04 11/18/2017 21:25 11/19/2017 05:36 11/19/2017 12:15  Glucose-Capillary Latest Ref Range: 70 - 99 mg/dL 143 (H) 129 (H) 88 143 (H) 116 (H) 147 (H)   Review of Glycemic Control  Diabetes history:  DM 2 Outpatient Diabetes medications: Lantus 60 units, Prandin 2 mg BID with meals Current orders for Inpatient glycemic control: Lantus 60 units Daily, Novolog 0-9 units tid, Prandin 2 mg BID with meals  Inpatient Diabetes Program Recommendations:    Based on glucose trends, would watch patient on current DM management regimen. Noted patient had a glucose of 88 mg/dl at suppertime yesterday, If hypoglycemia is of concern or if glucose decreases, could watch patient on Lantus and Prandin, d/c correction scale and check CBGs ACHS still.  Thanks,  Tama Headings RN, MSN, BC-ADM Inpatient Diabetes Coordinator Team Pager [phone] (8a-5p)
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
## 2017-11-19 NOTE — [facility] Group Notes (Signed)
  [facility] LCSW Group Therapy Note  Date/Time:  11/19/2017 11:15AM-12:00PM  Type of Therapy and Topic:  Group Therapy:  Feelings About Hospitalization  Participation Level:  Minimal   Description of Group This process group involved patients discussing their feelings related to being hospitalized, as well as the benefits they see to being in the hospital.  These feelings and benefits were itemized.  The group then brainstormed specific ways in which they could seek those same benefits when they discharge and return home.  Therapeutic Goals 1. Patient will identify and describe positive and negative feelings related to hospitalization 2. Patient will verbalize benefits of hospitalization to themselves personally 3. Patients will brainstorm together ways they can obtain similar benefits in the outpatient setting, identify barriers to wellness and possible solutions  Summary of Patient Progress:  The patient did not arrive until the last 10 minutes of group and immediately started interacting.  Therapeutic Modalities Cognitive Behavioral Therapy Motivational Interviewing    Selmer Dominion, LCSW 11/19/2017, 8:35 AM

## 2017-11-19 NOTE — [facility] Group Notes (Signed)
[facility] Group Notes:  (Nursing/MHT/Case Management/Adjunct)  Date:  11/19/2017  Time:  3:02 PM  Type of Therapy:  Nurse Education  Participation Level:  Active  Participation Quality:  Appropriate and Attentive  Affect:  Appropriate  Cognitive:  Appropriate  Insight:  Improving  Engagement in Group:  Engaged  Modes of Intervention:  Discussion, Education and Support  Summary of Progress/Problems: Pt was attentive during discussion of sleep hygiene.   Marya Landry 11/19/2017, 3:02 PM

## 2017-11-19 NOTE — Progress Notes (Signed)
D: [name] [name] denied current SI, HI, and AVH this a.m. He's been pleasant and cooperative today, with no concerns, questions, or needs voiced when asked. He rated his depression, anxiety, and hopelessness all zero/10 (10 being worst. He also reported good sleep, good appetite, normal energy level, and good concentration. He's been visible in the milieu and has attended groups.   A: Meds given as ordered, including PRN nicotine gum for cravings. Spoke with diabetes coordinator, who signed off on his current DM regimen (see note). Q15 safety checks maintained. Support/encouragement offered.  R: Pt remains free from harm and continues with treatment. Will continue to monitor for needs/safety.

## 2017-11-20 DIAGNOSIS — G4700 Insomnia, unspecified: Secondary | ICD-10-CM

## 2017-11-20 DIAGNOSIS — F17210 Nicotine dependence, cigarettes, uncomplicated: Secondary | ICD-10-CM

## 2017-11-20 DIAGNOSIS — R450 Nervousness: Secondary | ICD-10-CM

## 2017-11-20 DIAGNOSIS — Z79899 Other long term (current) drug therapy: Secondary | ICD-10-CM

## 2017-11-20 LAB — HEMOGLOBIN A1C
Hgb A1c MFr Bld: 10.8 % — ABNORMAL HIGH (ref 4.8–5.6)
MEAN PLASMA GLUCOSE: 263.26 mg/dL

## 2017-11-20 LAB — GLUCOSE, CAPILLARY
GLUCOSE-CAPILLARY: 139 mg/dL — AB (ref 70–99)
Glucose-Capillary: 120 mg/dL — ABNORMAL HIGH (ref 70–99)
Glucose-Capillary: 142 mg/dL — ABNORMAL HIGH (ref 70–99)
Glucose-Capillary: 110 mg/dL — ABNORMAL HIGH (ref 70–99)

## 2017-11-20 LAB — LIPID PANEL
CHOL/HDL RATIO: 2.8 ratio
CHOLESTEROL: 116 mg/dL (ref 0–200)
HDL: 41 mg/dL (ref >40)
LDL Cholesterol: 53 mg/dL (ref 0–99)
Triglycerides: 108 mg/dL (ref <150)
VLDL: 22 mg/dL (ref 0–40)

## 2017-11-20 MED ORDER — RISPERIDONE 1 MG PO TABS
1.0000 mg | ORAL_TABLET | Freq: Every day | ORAL | Status: DC
  Administered 2017-11-21 – 2017-12-02 (×12): 1 mg via ORAL
  Filled 2017-11-20 (×3): qty 1
  Filled 2017-11-20: qty 7
  Filled 2017-11-20 (×10): qty 1

## 2017-11-20 MED ORDER — RISPERIDONE 3 MG PO TABS
3.0000 mg | ORAL_TABLET | Freq: Every day | ORAL | Status: DC
  Administered 2017-11-20 – 2017-12-01 (×12): 3 mg via ORAL
  Filled 2017-11-20: qty 7
  Filled 2017-11-20 (×13): qty 1

## 2017-11-20 NOTE — Progress Notes (Addendum)
[facility] MD Progress Note  11/20/2017 11:23 AM [name].  MRN:  [id number] Subjective: Chalmers seen resting in bed.  Was reported patient was for most of the night responding to internal stimuli.  During this assessment patient continues to deny suicidal or homicidal ideations.  Denies auditory or visual hallucinations.  Reports taking medications as prescribed and tolerating them well.  Patient was encouraged to stay awake throughout the day and attend daily group sessions to help with sleeping hygiene.  Support encouragement reassurance was provided.  History: Per assessment -Walden Statz Jr.is an 52 y.o.malepresent to [facility] as a walk[facility] accompanied by his mother with complaints of bizarre behavior and auditory hallucinations. Per report from patient's mother she feels patient is having a psychotic break. Patient is a poor historian and speaks in a tangential language and appears to be delusional. Patient also displayed thought blocking. Patient denies suicidal / homicidal ideations and denies experiencing auditory / visual hallucinations. Patient report he taking his medication as prescribed but patient's mother feels patient is not taking his medication. Patient was last seen in the ED 08/09/2017 for bizarre behavior and last seen for an assessment 3.28.2019. Patient psychiatrist is Dr. Adele Schilder.  Principal Problem: Schizophrenia, paranoid type ([place]) Diagnosis:   Patient Active Problem List   Diagnosis Date Noted  . Schizophrenia, paranoid type ([place]) [F20.0] 11/18/2017  . DKA (diabetic ketoacidoses) ([place]) [E13.10] 11/05/2017  . Acute renal failure superimposed on stage 3 chronic kidney disease ([place]) [N17.9, N18.3] 11/05/2017  . Type II diabetes mellitus with renal manifestations ([place]) [E11.29] 11/05/2017  . Depression [F32.9] 11/05/2017  . Leukocytosis [D72.829] 11/05/2017  . Cough [R05] 11/05/2017  . Tobacco abuse [Z72.0] 11/05/2017  . Schizoaffective disorder, bipolar type ([place])  [F25.0] 06/10/2016  . DM (diabetes mellitus) type II uncontrolled, periph vascular disorder ([place]) [E11.51, E11.65] 02/29/2016  . Schizophrenia, disorganized type ([place]) [F20.1] 06/02/2015  . Hyperlipidemia [E78.5] 09/23/2014  . Renal insufficiency [N28.9] 09/23/2014  . HTN (hypertension) [I10] 03/22/2013  . Seasonal allergies [J30.2] 03/22/2013   Total Time spent with patient: 20 minutes  Past Psychiatric History:  Past Medical History:  Past Medical History:  Diagnosis Date  . Diabetes mellitus type II, uncontrolled ([place])   . HTN (hypertension)   . Hyperlipidemia LDL goal <70   . Schizophrenia ([place])    History reviewed. No pertinent surgical history. Family History:  Family History  Problem Relation Age of Onset  . Breast cancer Mother   . Diabetes Mother   . Prostate cancer Father        StepFather  . Hypertension Father   . Breast cancer Maternal Aunt   . Diabetes Maternal Uncle    Family Psychiatric  History:  Social History:  Social History   Substance and Sexual Activity  Alcohol Use Yes   Comment: occasional     Social History   Substance and Sexual Activity  Drug Use Yes  . Types: Marijuana    Social History   Socioeconomic History  . Marital status: Single    Spouse name: Not on file  . Number of children: Not on file  . Years of education: Not on file  . Highest education level: Not on file  Occupational History  . Not on file  Social Needs  . Financial resource strain: Not on file  . Food insecurity:    Worry: Not on file    Inability: Not on file  . Transportation needs:    Medical: Not on file    Non-medical: Not  on file  Tobacco Use  . Smoking status: Current Every Day Smoker    Packs/day: 2.00    Years: 30.00    Pack years: 60.00    Types: Cigarettes, Cigars  . Smokeless tobacco: Never Used  Substance and Sexual Activity  . Alcohol use: Yes    Comment: occasional  . Drug use: Yes    Types: Marijuana  . Sexual activity: Never   Lifestyle  . Physical activity:    Days per week: Not on file    Minutes per session: Not on file  . Stress: Not on file  Relationships  . Social connections:    Talks on phone: Not on file    Gets together: Not on file    Attends religious service: Not on file    Active member of club or organization: Not on file    Attends meetings of clubs or organizations: Not on file    Relationship status: Not on file  Other Topics Concern  . Not on file  Social History Narrative   Lives with mom   unemployed   No children   Additional Social History:                         Sleep: Poor  Appetite:  Fair  Current Medications: Current Facility-Administered Medications  Medication Dose Route Frequency Provider Last Rate Last Dose  . acetaminophen (TYLENOL) tablet 650 mg  650 mg Oral Q6H PRN Ethelene Hal, NP   650 mg at 11/20/17 0134  . alum & mag hydroxide-simeth (MAALOX/MYLANTA) 200-200-20 MG/5ML suspension 30 mL  30 mL Oral Q4H PRN Ethelene Hal, NP      . hydrOXYzine (ATARAX/VISTARIL) tablet 25 mg  25 mg Oral TID PRN Ethelene Hal, NP   25 mg at 11/19/17 2132  . insulin aspart (novoLOG) injection 0-9 Units  0-9 Units Subcutaneous TID WC Ethelene Hal, NP   1 Units at 11/19/17 1217  . insulin glargine (LANTUS) injection 60 Units  60 Units Subcutaneous Daily Ethelene Hal, NP   60 Units at 11/20/17 0854  . loratadine (CLARITIN) tablet 10 mg  10 mg Oral Daily Ethelene Hal, NP   10 mg at 11/20/17 0853  . magnesium hydroxide (MILK OF MAGNESIA) suspension 30 mL  30 mL Oral Daily PRN Ethelene Hal, NP      . metoprolol tartrate (LOPRESSOR) tablet 25 mg  25 mg Oral BID Ethelene Hal, NP   25 mg at 11/20/17 0853  . mirtazapine (REMERON) tablet 7.5 mg  7.5 mg Oral QHS Mahogani Holohan, Myer Peer, MD   7.5 mg at 11/19/17 2132  . mometasone-formoterol (DULERA) 200-5 MCG/ACT inhaler 2 puff  2 puff Inhalation BID Ethelene Hal,  NP   2 puff at 11/20/17 [phone]  . nicotine polacrilex (NICORETTE) gum 2 mg  2 mg Oral PRN Derrill Center, NP   2 mg at 11/20/17 1021  . repaglinide (PRANDIN) tablet 2 mg  2 mg Oral BID AC Ethelene Hal, NP   2 mg at 11/20/17 0853  . risperiDONE (RISPERDAL) tablet 1 mg  1 mg Oral Q breakfast Jaselynn Tamas, Myer Peer, MD   1 mg at 11/20/17 0853   And  . risperiDONE (RISPERDAL) tablet 2 mg  2 mg Oral QHS Ramon Brant, Myer Peer, MD   2 mg at 11/19/17 2132  . simvastatin (ZOCOR) tablet 40 mg  40 mg Oral QHS Ethelene Hal, NP  40 mg at 11/19/17 2132  . traZODone (DESYREL) tablet 50 mg  50 mg Oral QHS PRN Ethelene Hal, NP   50 mg at 11/19/17 2132  . [START ON 11/21/2017] Vitamin D (Ergocalciferol) (DRISDOL) capsule 50,000 Units  50,000 Units Oral Weekly Ethelene Hal, NP        Lab Results:  Results for orders placed or performed during the hospital encounter of 11/18/17 (from the past 48 hour(s))  Glucose, capillary     Status: None   Collection Time: 11/18/17  5:04 PM  Result Value Ref Range   Glucose-Capillary 88 70 - 99 mg/dL  Glucose, capillary     Status: Abnormal   Collection Time: 11/18/17  9:25 PM  Result Value Ref Range   Glucose-Capillary 143 (H) 70 - 99 mg/dL  Glucose, capillary     Status: Abnormal   Collection Time: 11/19/17  5:36 AM  Result Value Ref Range   Glucose-Capillary 116 (H) 70 - 99 mg/dL  Glucose, capillary     Status: Abnormal   Collection Time: 11/19/17 12:15 PM  Result Value Ref Range   Glucose-Capillary 147 (H) 70 - 99 mg/dL  Glucose, capillary     Status: Abnormal   Collection Time: 11/19/17  4:50 PM  Result Value Ref Range   Glucose-Capillary 106 (H) 70 - 99 mg/dL  TSH     Status: None   Collection Time: 11/19/17  6:27 PM  Result Value Ref Range   TSH 1.855 0.350 - 4.500 uIU/mL    Comment: Performed by a 3rd Generation assay with a functional sensitivity of <=0.01 uIU/mL. Performed at [facility], [place] [address], [place], [place] [address]   Glucose, capillary     Status: Abnormal   Collection Time: 11/19/17  8:31 PM  Result Value Ref Range   Glucose-Capillary 126 (H) 70 - 99 mg/dL  Glucose, capillary     Status: Abnormal   Collection Time: 11/20/17  5:57 AM  Result Value Ref Range   Glucose-Capillary 120 (H) 70 - 99 mg/dL  Lipid panel     Status: None   Collection Time: 11/20/17  6:40 AM  Result Value Ref Range   Cholesterol 116 0 - 200 mg/dL   Triglycerides 108 <150 mg/dL   HDL 41 >40 mg/dL   Total CHOL/HDL Ratio 2.8 RATIO   VLDL 22 0 - 40 mg/dL   LDL Cholesterol 53 0 - 99 mg/dL    Comment:        Total Cholesterol/HDL:CHD Risk Coronary Heart Disease Risk Table                     Men   Women  1/2 Average Risk   3.4   3.3  Average Risk       5.0   4.4  2 X Average Risk   9.6   7.1  3 X Average Risk  23.4   11.0        Use the calculated Patient Ratio above and the CHD Risk Table to determine the patient's CHD Risk.        ATP III CLASSIFICATION (LDL):  <100     mg/dL   Optimal  100-129  mg/dL   Near or Above                    Optimal  130-159  mg/dL   Borderline  160-189  mg/dL   High  >190  mg/dL   Very High Performed at [facility] [address], [place], [place] [address]     Blood Alcohol level:  Lab Results  Component Value Date   ETH <10 11/17/2017   ETH <10 82/95/6213    Metabolic Disorder Labs: Lab Results  Component Value Date   HGBA1C 11.3 (H) 11/06/2017   MPG 278 11/06/2017   MPG 151 09/30/2017   No results found for: PROLACTIN Lab Results  Component Value Date   CHOL 116 11/20/2017   TRIG 108 11/20/2017   HDL 41 11/20/2017   CHOLHDL 2.8 11/20/2017   VLDL 22 11/20/2017   LDLCALC 53 11/20/2017   LDLCALC 66 09/30/2017    Physical Findings: AIMS: Facial and Oral Movements Muscles of Facial Expression: None, normal Lips and Perioral Area: None, normal Jaw: None, normal Tongue: None, normal,Extremity Movements Upper  (arms, wrists, hands, fingers): None, normal Lower (legs, knees, ankles, toes): None, normal, Trunk Movements Neck, shoulders, hips: None, normal, Overall Severity Severity of abnormal movements (highest score from questions above): None, normal Incapacitation due to abnormal movements: None, normal Patient's awareness of abnormal movements (rate only patient's report): No Awareness, Dental Status Current problems with teeth and/or dentures?: No Does patient usually wear dentures?: No  CIWA:    COWS:     Musculoskeletal: Strength & Muscle Tone: within normal limits Gait & Station: normal Patient leans: N/A  Psychiatric Specialty Exam: Physical Exam  Constitutional: He appears well-developed.  Psychiatric: He has a normal mood and affect. His behavior is normal.    Review of Systems  Psychiatric/Behavioral: Negative for depression, hallucinations and suicidal ideas. The patient is nervous/anxious and has insomnia.   All other systems reviewed and are negative.   Blood pressure (!) 149/94, pulse (!) 105, temperature 98.6 F (37 C), temperature source Oral, resp. rate 18, height 5\' 10"  (1.778 m), weight 108 kg, SpO2 98 %.Body mass index is 34.15 kg/m.  General Appearance: Disheveled  Eye Contact:  Intense staring  Speech:  Mumbled due to missing teeth  Volume:  Normal  Mood:  Anxious  Affect:  Blunt  Thought Process:  Linear  Orientation:  Full (Time, Place, and Person)  Thought Content:  Logical and Hallucinations: None  Suicidal Thoughts:  No  Homicidal Thoughts:  No  Memory:  Immediate;   Fair Recent;   Fair  Judgement:  Fair  Insight:  Fair  Psychomotor Activity:  Normal  Concentration:  Concentration: Fair  Recall:  [name] of Knowledge:  Fair  Language:  Fair  Akathisia:  No  Handed:  Right  AIMS (if indicated):     Assets:  Communication Skills Desire for Improvement Resilience Social Support  ADL's:  Intact  Cognition:  WNL  Sleep:  Number of Hours:  4.25    Treatment Plan Summary: Daily contact with patient to assess and evaluate symptoms and progress in treatment and Medication management   Continue with current treatment plan on 11/20/2017 as listed below except were noted  Mood stabilization    Continue Resporal 1 mg every morning and 3 mg nightly Insomnia   Continue with Trazodone 50 mg as needed   Continue Remeron 7.5 mg p.o. nightly     Will continue to monitor vitals ,medication compliance and treatment side effects while patient is here.   Reviewed labs Glucose elevated 120,BAL - , UDS -  CSW will continue working on disposition.  Patient to participate in therapeutic milieu  Derrill Center, NP 11/20/2017, 11:23 AM   ..  Agree with NP Progress Note

## 2017-11-20 NOTE — Progress Notes (Signed)
Patient ID: [name] [name]., male   DOB: Mar 10, 1966, 52 y.o.   MRN: [id number]    D: Pt has been very flat, depressed, and isolative today on the unit today. Pt remained in the bed most of the day, he was encouraged by staff to to get up this morning for meds and group. Pt has no insight and is not vested in treatment. Pt reported that his depression was a 0, his hopelessness was a 0, and his anxiety was a 0. Pt reported that he had no goal for today. Pt reported being negative SI/HI, no AH/VH noted. A: 15 min checks continued for patient safety. R: Pt safety maintained.

## 2017-11-20 NOTE — [facility] Group Notes (Signed)
[facility] LCSW Group Therapy Note  Date/Time:  11/20/2017  11:00AM-12:00PM  Type of Therapy and Topic:  Group Therapy:  Music and Mood  Participation Level:  Active   Description of Group: In this process group, members listened to a variety of genres of music and identified that different types of music evoke different responses.  Patients were encouraged to identify music that was soothing for them and music that was energizing for them.  Patients discussed how this knowledge can help with wellness and recovery in various ways including managing depression and anxiety as well as encouraging healthy sleep habits.    Therapeutic Goals: 1. Patients will explore the impact of different varieties of music on mood 2. Patients will verbalize the thoughts they have when listening to different types of music 3. Patients will identify music that is soothing to them as well as music that is energizing to them 4. Patients will discuss how to use this knowledge to assist in maintaining wellness and recovery 5. Patients will explore the use of music as a coping skill  Summary of Patient Progress:  At the beginning of group, patient expressed that he felt pretty good.  He was observed responding to internal stimuli throughout group.  He remained pleasant throughout and said he still felt good at the end of group.  Therapeutic Modalities: Solution Focused Brief Therapy Activity   Selmer Dominion, LCSW

## 2017-11-20 NOTE — BHH Group Notes (Signed)
Adult Psychoeducational Group Note  Date:  11/20/2017 Time:  9:50 PM  Group Topic/Focus:  Wrap-Up Group:   The focus of this group is to help patients review their daily goal of treatment and discuss progress on daily workbooks.  Participation Level:  Active  Participation Quality:  Appropriate and Attentive  Affect:  Appropriate  Cognitive:  Alert and Appropriate  Insight: Appropriate and Good  Engagement in Group:  Engaged  Modes of Intervention:  Discussion and Education  Additional Comments:  Pt attended and participated in wrap up group this evening. Pt rated their day a 6/10, due to them having a pretty good day and going to groups. Pt has a on going goal to get better, which consists of them taking their meds.    Cristi Loron 11/20/2017, 9:50 PM

## 2017-11-20 NOTE — Progress Notes (Signed)
Patient ID: [name] [name]., male   DOB: 11/04/1965, 52 y.o.   MRN: [id number] DAR Note: Pt continually pace the hallways all evening. Pt at assessment endorsed moderate anxiety, shakiness and AH; "They keep talking to me; keep saying different thinks."-Pt denied command auditory hallucinations. Pt denied depression or SI/HI. Pt was med compliant. All patient's questions and concerns addressed. Support, encouragement, and safe environment provided. 15-minute safety checks continue. Pt did attend wrap-up group.

## 2017-11-20 NOTE — Progress Notes (Signed)
D   Pt up and visible in the dayroom   He is talkative    He appears anxious and depressed   He told staff that he was brought to the hospital by the police because he forgot to take his ASA   Pt is limited and has minimal to no insight A   Verbal support given   Medications administered and effectiveness monitored   Q 15 min checks   Redirection as needed R  Pt is safe at this time

## 2017-11-21 LAB — GLUCOSE, CAPILLARY
GLUCOSE-CAPILLARY: 126 mg/dL — AB (ref 70–99)
Glucose-Capillary: 104 mg/dL — ABNORMAL HIGH (ref 70–99)
Glucose-Capillary: 108 mg/dL — ABNORMAL HIGH (ref 70–99)

## 2017-11-21 LAB — PROLACTIN: Prolactin: 63.7 ng/mL — ABNORMAL HIGH (ref 4.0–15.2)

## 2017-11-21 NOTE — Progress Notes (Signed)
Recreation Therapy Notes   Date: 9.9.19 Time: 1000 Location: 500 Hall Dayroom  Group Topic: Goal Setting  Goal Area(s) Addresses:  Patient will successfully set at least 3 goals for their future during group.  Patient will successfully identify benefit of setting goals.    Behavioral Response: Engaged  Intervention: Worksheet, pencils  Activity: Goal Setting.  Patients were given worksheets that were broken into 6 categories (family, friends, work/school, body, mental health and spirituality).  Patients were to identify what they are doing well, what they want to improve and set a goal of how to make that improvement.  Patients will then share their top 3 goals with the group.    Education Outcome:  Acknowledges education In group clarification offered Needs additional education  Clinical Observations/Feedback: Pt arrived near the end of group.  Pt expressed with his family he does well at coping with problems, needs to improve dealing with future events and set a goal of finding his own way.    [name] [name], LRT/CTRS         [name] [name] A 11/21/2017 12:58 PM

## 2017-11-21 NOTE — Progress Notes (Signed)
[facility] MD Progress Note  11/21/2017 2:24 PM [name] [name].  MRN:  [id number] Subjective:    History Per assessment: -Hakop Humbarger Jr.is an 52 y.o.malepresent to [name] as a walk-in accompanied by his mother with complaints of bizarre behavior and auditory hallucinations. Per report from patient's mother she feels patient is having a psychotic break. Patient is a poor historian and speaks in a tangential language and appears to be delusional. Patient also displayed thought blocking. Patient denies suicidal / homicidal ideations and denies experiencing auditory / visual hallucinations. Patient report he taking his medication as prescribed but patient's mother feels patient is not taking his medication. Patient was last seen in the ED 08/09/2017 for bizarre behavior and last seen for an assessment 3.28.2019. Patient psychiatrist is Dr. Adele Schilder.  Today upon evaluation: Pt was evaluated in the office. He shares, "I was noticing that my blood sugar was high, so that's why I was in the emergency room twice in a few days." Pt continues to assert that he was taken to the hospital by "a charlatan" that he describes as an impostor of his mother. He has no explanation why this person would take him to the hospital. Pt reports that he had been taking his medications prior to admission, despite history that he had been off of his medications. He reports that he is tolerating being resumed on remeron and risperdal without difficulty or side effects. He denies SI/HI/AH/VH. He is in agreement to increase dose of remeron tonight. He was in agreement with the above plan and he had no further questions, comments, or concerns.  Principal Problem: Schizophrenia, paranoid type ([place]) Diagnosis:   Patient Active Problem List   Diagnosis Date Noted  . Schizophrenia, paranoid type ([place]) [F20.0] 11/18/2017  . DKA (diabetic ketoacidoses) ([place]) [E13.10] 11/05/2017  . Acute renal failure superimposed on stage 3 chronic  kidney disease ([place]) [N17.9, N18.3] 11/05/2017  . Type II diabetes mellitus with renal manifestations ([place]) [E11.29] 11/05/2017  . Depression [F32.9] 11/05/2017  . Leukocytosis [D72.829] 11/05/2017  . Cough [R05] 11/05/2017  . Tobacco abuse [Z72.0] 11/05/2017  . Schizoaffective disorder, bipolar type ([place]) [F25.0] 06/10/2016  . DM (diabetes mellitus) type II uncontrolled, periph vascular disorder ([place]) [E11.51, E11.65] 02/29/2016  . Schizophrenia, disorganized type ([name]) [F20.1] 06/02/2015  . Hyperlipidemia [E78.5] 09/23/2014  . Renal insufficiency [N28.9] 09/23/2014  . HTN (hypertension) [I10] 03/22/2013  . Seasonal allergies [J30.2] 03/22/2013   Total Time spent with patient: 30 minutes  Past Psychiatric History: see H&P  Past Medical History:  Past Medical History:  Diagnosis Date  . Diabetes mellitus type II, uncontrolled ([place])   . HTN (hypertension)   . Hyperlipidemia LDL goal <70   . Schizophrenia ([place])    History reviewed. No pertinent surgical history. Family History:  Family History  Problem Relation Age of Onset  . Breast cancer Mother   . Diabetes Mother   . Prostate cancer Father        StepFather  . Hypertension Father   . Breast cancer Maternal Aunt   . Diabetes Maternal Uncle    Family Psychiatric  History: see H&P Social History:  Social History   Substance and Sexual Activity  Alcohol Use Yes   Comment: occasional     Social History   Substance and Sexual Activity  Drug Use Yes  . Types: Marijuana    Social History   Socioeconomic History  . Marital status: Single    Spouse name: Not on file  . Number of children:  Not on file  . Years of education: Not on file  . Highest education level: Not on file  Occupational History  . Not on file  Social Needs  . Financial resource strain: Not on file  . Food insecurity:    Worry: Not on file    Inability: Not on file  . Transportation needs:    Medical: Not on file    Non-medical: Not on file   Tobacco Use  . Smoking status: Current Every Day Smoker    Packs/day: 2.00    Years: 30.00    Pack years: 60.00    Types: Cigarettes, Cigars  . Smokeless tobacco: Never Used  Substance and Sexual Activity  . Alcohol use: Yes    Comment: occasional  . Drug use: Yes    Types: Marijuana  . Sexual activity: Never  Lifestyle  . Physical activity:    Days per week: Not on file    Minutes per session: Not on file  . Stress: Not on file  Relationships  . Social connections:    Talks on phone: Not on file    Gets together: Not on file    Attends religious service: Not on file    Active member of club or organization: Not on file    Attends meetings of clubs or organizations: Not on file    Relationship status: Not on file  Other Topics Concern  . Not on file  Social History Narrative   Lives with mom   unemployed   No children   Additional Social History:                         Sleep: Good  Appetite:  Good  Current Medications: Current Facility-Administered Medications  Medication Dose Route Frequency Provider Last Rate Last Dose  . acetaminophen (TYLENOL) tablet 650 mg  650 mg Oral Q6H PRN Ethelene Hal, NP   650 mg at 11/20/17 0134  . alum & mag hydroxide-simeth (MAALOX/MYLANTA) 200-200-20 MG/5ML suspension 30 mL  30 mL Oral Q4H PRN Ethelene Hal, NP      . hydrOXYzine (ATARAX/VISTARIL) tablet 25 mg  25 mg Oral TID PRN Ethelene Hal, NP   25 mg at 11/19/17 2132  . insulin aspart (novoLOG) injection 0-9 Units  0-9 Units Subcutaneous TID WC Ethelene Hal, NP   2 Units at 11/21/17 1208  . insulin glargine (LANTUS) injection 60 Units  60 Units Subcutaneous Daily Ethelene Hal, NP   60 Units at 11/21/17 0754  . loratadine (CLARITIN) tablet 10 mg  10 mg Oral Daily Ethelene Hal, NP   10 mg at 11/21/17 0758  . magnesium hydroxide (MILK OF MAGNESIA) suspension 30 mL  30 mL Oral Daily PRN Ethelene Hal, NP      .  metoprolol tartrate (LOPRESSOR) tablet 25 mg  25 mg Oral BID Ethelene Hal, NP   25 mg at 11/21/17 0757  . mirtazapine (REMERON) tablet 7.5 mg  7.5 mg Oral QHS Cobos, Myer Peer, MD   7.5 mg at 11/20/17 2141  . mometasone-formoterol (DULERA) 200-5 MCG/ACT inhaler 2 puff  2 puff Inhalation BID Ethelene Hal, NP   2 puff at 11/21/17 0758  . nicotine polacrilex (NICORETTE) gum 2 mg  2 mg Oral PRN Derrill Center, NP   2 mg at 11/21/17 1059  . repaglinide (PRANDIN) tablet 2 mg  2 mg Oral BID AC Ethelene Hal, NP  2 mg at 11/21/17 0641  . risperiDONE (RISPERDAL) tablet 3 mg  3 mg Oral QHS Derrill Center, NP   3 mg at 11/20/17 2141   And  . risperiDONE (RISPERDAL) tablet 1 mg  1 mg Oral Q breakfast Derrill Center, NP   1 mg at 11/21/17 0757  . simvastatin (ZOCOR) tablet 40 mg  40 mg Oral QHS Ethelene Hal, NP   40 mg at 11/20/17 2141  . traZODone (DESYREL) tablet 50 mg  50 mg Oral QHS PRN Ethelene Hal, NP   50 mg at 11/19/17 2132  . Vitamin D (Ergocalciferol) (DRISDOL) capsule 50,000 Units  50,000 Units Oral Weekly Ethelene Hal, NP   50,000 Units at 11/21/17 4098    Lab Results:  Results for orders placed or performed during the hospital encounter of 11/18/17 (from the past 48 hour(s))  Glucose, capillary     Status: Abnormal   Collection Time: 11/19/17  4:50 PM  Result Value Ref Range   Glucose-Capillary 106 (H) 70 - 99 mg/dL  TSH     Status: None   Collection Time: 11/19/17  6:27 PM  Result Value Ref Range   TSH 1.855 0.350 - 4.500 uIU/mL    Comment: Performed by a 3rd Generation assay with a functional sensitivity of <=0.01 uIU/mL. Performed at [facility], [place] [address], [place], [place] [address]   Glucose, capillary     Status: Abnormal   Collection Time: 11/19/17  8:31 PM  Result Value Ref Range   Glucose-Capillary 126 (H) 70 - 99 mg/dL  Glucose, capillary     Status: Abnormal   Collection Time: 11/20/17  5:57  AM  Result Value Ref Range   Glucose-Capillary 120 (H) 70 - 99 mg/dL  Prolactin     Status: Abnormal   Collection Time: 11/20/17  6:40 AM  Result Value Ref Range   Prolactin 63.7 (H) 4.0 - 15.2 ng/mL    Comment: (NOTE) Performed At: [facility] [place], [place] [id number] Rush Farmer MD [id number]   Hemoglobin A1c     Status: Abnormal   Collection Time: 11/20/17  6:40 AM  Result Value Ref Range   Hgb A1c MFr Bld 10.8 (H) 4.8 - 5.6 %    Comment: (NOTE) Pre diabetes:          5.7%-6.4% Diabetes:              >6.4% Glycemic control for   <7.0% adults with diabetes    Mean Plasma Glucose 263.26 mg/dL    Comment: Performed at [facility] [address] [place], [place] [address]  Lipid panel     Status: None   Collection Time: 11/20/17  6:40 AM  Result Value Ref Range   Cholesterol 116 0 - 200 mg/dL   Triglycerides 108 <150 mg/dL   HDL 41 >40 mg/dL   Total CHOL/HDL Ratio 2.8 RATIO   VLDL 22 0 - 40 mg/dL   LDL Cholesterol 53 0 - 99 mg/dL    Comment:        Total Cholesterol/HDL:CHD Risk Coronary Heart Disease Risk Table                     Men   Women  1/2 Average Risk   3.4   3.3  Average Risk       5.0   4.4  2 X Average Risk   9.6   7.1  3  X Average Risk  23.4   11.0        Use the calculated Patient Ratio above and the CHD Risk Table to determine the patient's CHD Risk.        ATP III CLASSIFICATION (LDL):  <100     mg/dL   Optimal  100-129  mg/dL   Near or Above                    Optimal  130-159  mg/dL   Borderline  160-189  mg/dL   High  >190     mg/dL   Very High Performed at [facility] [address], [place], [place] [address]   Glucose, capillary     Status: Abnormal   Collection Time: 11/20/17 12:09 PM  Result Value Ref Range   Glucose-Capillary 139 (H) 70 - 99 mg/dL  Glucose, capillary     Status: Abnormal   Collection Time: 11/20/17  5:08 PM  Result Value Ref Range   Glucose-Capillary  142 (H) 70 - 99 mg/dL  Glucose, capillary     Status: Abnormal   Collection Time: 11/20/17  8:40 PM  Result Value Ref Range   Glucose-Capillary 110 (H) 70 - 99 mg/dL  Glucose, capillary     Status: Abnormal   Collection Time: 11/21/17  6:12 AM  Result Value Ref Range   Glucose-Capillary 104 (H) 70 - 99 mg/dL    Blood Alcohol level:  Lab Results  Component Value Date   ETH <10 11/17/2017   ETH <10 90/24/0973    Metabolic Disorder Labs: Lab Results  Component Value Date   HGBA1C 10.8 (H) 11/20/2017   MPG 263.26 11/20/2017   MPG 278 11/06/2017   Lab Results  Component Value Date   PROLACTIN 63.7 (H) 11/20/2017   Lab Results  Component Value Date   CHOL 116 11/20/2017   TRIG 108 11/20/2017   HDL 41 11/20/2017   CHOLHDL 2.8 11/20/2017   VLDL 22 11/20/2017   LDLCALC 53 11/20/2017   LDLCALC 66 09/30/2017    Physical Findings: AIMS: Facial and Oral Movements Muscles of Facial Expression: None, normal Lips and Perioral Area: None, normal Jaw: None, normal Tongue: None, normal,Extremity Movements Upper (arms, wrists, hands, fingers): None, normal Lower (legs, knees, ankles, toes): None, normal, Trunk Movements Neck, shoulders, hips: None, normal, Overall Severity Severity of abnormal movements (highest score from questions above): None, normal Incapacitation due to abnormal movements: None, normal Patient's awareness of abnormal movements (rate only patient's report): No Awareness, Dental Status Current problems with teeth and/or dentures?: No Does patient usually wear dentures?: No  CIWA:    COWS:     Musculoskeletal: Strength & Muscle Tone: within normal limits Gait & Station: normal Patient leans: N/A  Psychiatric Specialty Exam: Physical Exam  Nursing note and vitals reviewed.   ROS  Blood pressure (!) 126/102, pulse 100, temperature 98.4 F (36.9 C), resp. rate 20, height 5\' 10"  (1.778 m), weight 108 kg, SpO2 98 %.Body mass index is 34.15 kg/m.   General Appearance: Casual and Disheveled  Eye Contact:  Good  Speech:  Clear and Coherent and Normal Rate  Volume:  Normal  Mood:  Euthymic  Affect:  Blunt and Flat  Thought Process:  Coherent and Goal Directed  Orientation:  Full (Time, Place, and Person)  Thought Content:  Delusions and Paranoid Ideation  Suicidal Thoughts:  No  Homicidal Thoughts:  No  Memory:  Immediate;   Fair Recent;   Fair  Remote;   Fair  Judgement:  Poor  Insight:  Lacking  Psychomotor Activity:  Normal  Concentration:  Concentration: Fair  Recall:  [name] of Knowledge:  Fair  Language:  Fair  Akathisia:  No  Handed:    AIMS (if indicated):     Assets:  Physical Health Resilience Social Support  ADL's:  Intact  Cognition:  WNL  Sleep:  Number of Hours: 6.25   Treatment Plan Summary: Daily contact with patient to assess and evaluate symptoms and progress in treatment and Medication management   Continue with current treatment plan on 11/21/2017 as listed below except were noted   -Schizophrenia   -Continue Risperdal 1mg  po qAM +3 mg po qhs  Insomnia                         -Continue with Trazodone 50 mg qhs prn insomnia                        - Change Remeron 7.5 mg po qhs to remeron 15mg  po qhs                          -HLD   -Continue simvastatin 40mg  po qDay  -COPD   -Continue dulera 200-5 mcg/act take 2 puffs BID  -HTN   -Continue lopressor 25mg  po BID  -seasonal allergy   -Continue claritin 10mg  po qDay  -DMII   -Continue current SSI orders with lantus and novolog   -Continue repalinide 2mg  po BID  -anxiety  -Continue vistaril 25mg  po TID prn anxiety  Will continue to monitor vitals ,medication compliance and treatment side effects while patient is here.   Reviewed labs Glucose elevated 120,BAL - , UDS -  CSW will continue working on disposition.  Patient to participate in therapeutic milieu   Pennelope Bracken, MD 11/21/2017, 2:24 PM

## 2017-11-21 NOTE — Progress Notes (Signed)
Adult Psychoeducational Group Note  Date:  11/21/2017 Time:  8:41 PM  Group Topic/Focus:  Wrap-Up Group:   The focus of this group is to help patients review their daily goal of treatment and discuss progress on daily workbooks.  Participation Level:  Active  Participation Quality:  Appropriate  Affect:  Appropriate  Cognitive:  Appropriate  Insight: Appropriate  Engagement in Group:  Engaged  Modes of Intervention:  Discussion  Additional Comments: The patient expressed that he rates today a 10.The patient also said that he attended group.   Nash Shearer 11/21/2017, 8:41 PM

## 2017-11-21 NOTE — Tx Team (Signed)
Interdisciplinary Treatment and Diagnostic Plan Update  11/21/2017 Time of Session: 10:45am [name] [name]. MRN: [id number]  Principal Diagnosis: Schizophrenia, paranoid type ([place])  Secondary Diagnoses: Principal Problem:   Schizophrenia, paranoid type ([place])   Current Medications:  Current Facility-Administered Medications  Medication Dose Route Frequency Provider Last Rate Last Dose  . acetaminophen (TYLENOL) tablet 650 mg  650 mg Oral Q6H PRN Ethelene Hal, NP   650 mg at 11/20/17 0134  . alum & mag hydroxide-simeth (MAALOX/MYLANTA) 200-200-20 MG/5ML suspension 30 mL  30 mL Oral Q4H PRN Ethelene Hal, NP      . hydrOXYzine (ATARAX/VISTARIL) tablet 25 mg  25 mg Oral TID PRN Ethelene Hal, NP   25 mg at 11/19/17 2132  . insulin aspart (novoLOG) injection 0-9 Units  0-9 Units Subcutaneous TID WC Ethelene Hal, NP   2 Units at 11/21/17 1208  . insulin glargine (LANTUS) injection 60 Units  60 Units Subcutaneous Daily Ethelene Hal, NP   60 Units at 11/21/17 0754  . loratadine (CLARITIN) tablet 10 mg  10 mg Oral Daily Ethelene Hal, NP   10 mg at 11/21/17 0758  . magnesium hydroxide (MILK OF MAGNESIA) suspension 30 mL  30 mL Oral Daily PRN Ethelene Hal, NP      . metoprolol tartrate (LOPRESSOR) tablet 25 mg  25 mg Oral BID Ethelene Hal, NP   25 mg at 11/21/17 0757  . mirtazapine (REMERON) tablet 7.5 mg  7.5 mg Oral QHS Cobos, Myer Peer, MD   7.5 mg at 11/20/17 2141  . mometasone-formoterol (DULERA) 200-5 MCG/ACT inhaler 2 puff  2 puff Inhalation BID Ethelene Hal, NP   2 puff at 11/21/17 0758  . nicotine polacrilex (NICORETTE) gum 2 mg  2 mg Oral PRN Derrill Center, NP   2 mg at 11/21/17 1547  . repaglinide (PRANDIN) tablet 2 mg  2 mg Oral BID AC Ethelene Hal, NP   2 mg at 11/21/17 0641  . risperiDONE (RISPERDAL) tablet 3 mg  3 mg Oral QHS Derrill Center, NP   3 mg at 11/20/17 2141   And  . risperiDONE  (RISPERDAL) tablet 1 mg  1 mg Oral Q breakfast Derrill Center, NP   1 mg at 11/21/17 0757  . simvastatin (ZOCOR) tablet 40 mg  40 mg Oral QHS Ethelene Hal, NP   40 mg at 11/20/17 2141  . traZODone (DESYREL) tablet 50 mg  50 mg Oral QHS PRN Ethelene Hal, NP   50 mg at 11/19/17 2132  . Vitamin D (Ergocalciferol) (DRISDOL) capsule 50,000 Units  50,000 Units Oral Weekly Ethelene Hal, NP   50,000 Units at 11/21/17 0759   PTA Medications: Medications Prior to Admission  Medication Sig Dispense Refill Last Dose  . ALLERGY RELIEF 10 MG tablet TAKE 1 TABLET BY MOUTH EVERY DAY (Patient taking differently: Take 10 mg by mouth daily as needed for allergies. ) 30 tablet 0 Past Month at Unknown time  . aspirin EC 81 MG tablet Take 1 tablet (81 mg total) by mouth daily.   11/17/2017 at Unknown time  . blood glucose meter kit and supplies KIT Dispense based on patient and insurance preference. Use up to four times daily as directed. (FOR ICD-9 250.00, 250.01). 1 each 0 Taking  . Fluticasone-Salmeterol (ADVAIR DISKUS) 250-50 MCG/DOSE AEPB Inhale 1 puff into the lungs 2 (two) times daily. (Patient taking differently: Inhale 1 puff into the lungs 2 (two)  times daily as needed (wheezing). ) 1 each 3 Past Month at Unknown time  . insulin glargine (LANTUS) 100 UNIT/ML injection Inject 0.6 mLs (60 Units total) into the skin daily. 10 mL 11 11/17/2017 at Unknown time  . Insulin Pen Needle 32G X 6 MM MISC 1 Units by Does not apply route every morning. 90 each 1 Taking  . metoprolol tartrate (LOPRESSOR) 25 MG tablet TAKE 1 TABLET BY MOUTH TWICE A DAY (Patient taking differently: Take 25 mg by mouth 2 (two) times daily. ) 180 tablet 1 11/17/2017 at 1100  . mirtazapine (REMERON) 30 MG tablet Take 1 tablet (30 mg total) by mouth at bedtime. 30 tablet 2 11/16/2017 at Unknown time  . Multiple Vitamin (MULTIVITAMIN WITH MINERALS) TABS tablet Take 1 tablet by mouth daily.   11/17/2017 at Unknown time  .  repaglinide (PRANDIN) 2 MG tablet Take 1 tablet (2 mg total) by mouth 3 (three) times daily before meals. (Patient taking differently: Take 2 mg by mouth 2 (two) times daily before a meal. ) 90 tablet 1 11/17/2017 at Unknown time  . risperiDONE (RISPERDAL) 2 MG tablet TAKE 1 TABLET IN THE MORNING AND TAKE 2 TABLETS AT BEDTIME (Patient taking differently: Take 2-4 mg by mouth See admin instructions. TAKE 1 TABLET IN THE MORNING AND TAKE 2 TABLETS AT BEDTIME) 90 tablet 2 11/17/2017 at Unknown time  . simvastatin (ZOCOR) 40 MG tablet TAKE 1 TABLET BY MOUTH EVERY DAY (Patient taking differently: Take 40 mg by mouth daily. ) 90 tablet 1 11/16/2017 at Unknown time  . traZODone (DESYREL) 100 MG tablet Take 1 tablet (100 mg total) by mouth at bedtime. 30 tablet 2 11/16/2017 at Unknown time  . Vitamin D, Ergocalciferol, (DRISDOL) 50000 units CAPS capsule Take 1 capsule by mouth once a week. On Monday  2 11/14/2017    Patient Stressors: Financial difficulties Health problems Medication change or noncompliance  Patient Strengths: Motivation for treatment/growth Supportive family/friends  Treatment Modalities: Medication Management, Group therapy, Case management,  1 to 1 session with clinician, Psychoeducation, Recreational therapy.   Physician Treatment Plan for Primary Diagnosis: Schizophrenia, paranoid type ([place]) Long Term Goal(s): Improvement in symptoms so as ready for discharge Improvement in symptoms so as ready for discharge   Short Term Goals: Ability to identify changes in lifestyle to reduce recurrence of condition will improve Ability to disclose and discuss suicidal ideas Ability to demonstrate self-control will improve Ability to identify and develop effective coping behaviors will improve Ability to identify changes in lifestyle to reduce recurrence of condition will improve Ability to verbalize feelings will improve Ability to disclose and discuss suicidal ideas Ability to demonstrate  self-control will improve  Medication Management: Evaluate patient's response, side effects, and tolerance of medication regimen.  Therapeutic Interventions: 1 to 1 sessions, Unit Group sessions and Medication administration.  Evaluation of Outcomes: Not Met  Physician Treatment Plan for Secondary Diagnosis: Principal Problem:   Schizophrenia, paranoid type ([place])  Long Term Goal(s): Improvement in symptoms so as ready for discharge Improvement in symptoms so as ready for discharge   Short Term Goals: Ability to identify changes in lifestyle to reduce recurrence of condition will improve Ability to disclose and discuss suicidal ideas Ability to demonstrate self-control will improve Ability to identify and develop effective coping behaviors will improve Ability to identify changes in lifestyle to reduce recurrence of condition will improve Ability to verbalize feelings will improve Ability to disclose and discuss suicidal ideas Ability to demonstrate self-control will improve  Medication Management: Evaluate patient's response, side effects, and tolerance of medication regimen.  Therapeutic Interventions: 1 to 1 sessions, Unit Group sessions and Medication administration.  Evaluation of Outcomes: Not Met   RN Treatment Plan for Primary Diagnosis: Schizophrenia, paranoid type ([place]) Long Term Goal(s): Knowledge of disease and therapeutic regimen to maintain health will improve  Short Term Goals: Ability to demonstrate self-control, Ability to participate in decision making will improve, Ability to verbalize feelings will improve, Ability to identify and develop effective coping behaviors will improve and Compliance with prescribed medications will improve  Medication Management: RN will administer medications as ordered by provider, will assess and evaluate patient's response and provide education to patient for prescribed medication. RN will report any adverse and/or side effects  to prescribing provider.  Therapeutic Interventions: 1 on 1 counseling sessions, Psychoeducation, Medication administration, Evaluate responses to treatment, Monitor vital signs and CBGs as ordered, Perform/monitor CIWA, COWS, AIMS and Fall Risk screenings as ordered, Perform wound care treatments as ordered.  Evaluation of Outcomes: Not Met   LCSW Treatment Plan for Primary Diagnosis: Schizophrenia, paranoid type ([place]) Long Term Goal(s): Safe transition to appropriate next level of care at discharge, Engage patient in therapeutic group addressing interpersonal concerns.  Short Term Goals: Engage patient in aftercare planning with referrals and resources and Increase skills for wellness and recovery  Therapeutic Interventions: Assess for all discharge needs, 1 to 1 time with Social worker, Explore available resources and support systems, Assess for adequacy in community support network, Educate family and significant other(s) on suicide prevention, Complete Psychosocial Assessment, Interpersonal group therapy.  Evaluation of Outcomes: Not Met   Progress in Treatment: Attending groups: Yes. Participating in groups: Yes. Taking medication as prescribed: Yes. Toleration medication: Yes. Family/Significant other contact made: Yes, individual(s) contacted:  patient's mother Patient understands diagnosis: Yes. Limited insight Discussing patient identified problems/goals with staff: Yes. Medical problems stabilized or resolved: Yes. Denies suicidal/homicidal ideation: Yes. Issues/concerns per patient self-inventory: No. Other:   New problem(s) identified: None  New Short Term/Long Term Goal(s):medication stabilization, elimination of SI thoughts, development of comprehensive mental wellness plan.   Patient Goals:  Coping skills   Discharge Plan or Barriers: CSW will assess for appropriate referrals and discharge planning.   Reason for Continuation of Hospitalization:  Hallucinations Medication stabilization Other; describe [place] behaviors  Estimated Length of Stay: 11/25/17  Attendees: Patient: 11/21/2017 4:13 PM  Physician: Dr. Maris Berger, MD 11/21/2017 4:13 PM  Nursing: Benjamine Mola.A, RN 11/21/2017 4:13 PM  RN Care Manager: Rhunette Croft 11/21/2017 4:13 PM  Social Worker: Radonna Ricker, [place] 11/21/2017 4:13 PM  Recreational Therapist: X 11/21/2017 4:13 PM  Other: X 11/21/2017 4:13 PM  Other: X 11/21/2017 4:13 PM  Other:X 11/21/2017 4:13 PM    Scribe for Treatment Team: Marylee Floras, [place] 11/21/2017 4:13 PM

## 2017-11-21 NOTE — Progress Notes (Signed)
Recreation Therapy Notes  INPATIENT RECREATION THERAPY ASSESSMENT  Patient Details Name: [name] [name]. MRN: [id number] DOB: 09-12-1965 Today's Date: 11/21/2017       Information Obtained From: Patient  Able to Participate in Assessment/Interview: Yes  Patient Presentation: Alert  Reason for Admission (Per Patient): Other (Comments)(Pt stated his blood sugar was high.)  Patient Stressors: (Pt stated he had no stressors.)  Coping Skills:   Music, Exercise, Deep Breathing, Meditate, Substance Abuse, Talk, Avoidance  Leisure Interests (2+):  Exercise - Walking, Individual - Reading  Frequency of Recreation/Participation: Other (Comment)(Pt stated whenever he could.)  Awareness of Community Resources:  Yes  Community Resources:  [employer], Other (Comment)(Stores)  Current Use: Yes  If no, Barriers?:    Expressed Interest in [place]: Yes  County of Residence:  [place]  Patient Main Form of Transportation: Other (Comment)(Pt stated he has a driver take him places)  Patient Strengths:  Intelligent; able to do things I can do  Patient Identified Areas of Improvement:  Get out more  Patient Goal for Hospitalization:  "Cope with things dealing with future developments"  Current SI (including self-harm):  No  Current HI:  No  Current AVH: No  Staff Intervention Plan: Group Attendance, Collaborate with Interdisciplinary Treatment Team  Consent to Intern Participation: N/A   Victorino Sparrow, LRT/CTRS  Ria Comment, Jenika Chiem A 11/21/2017, 2:14 PM

## 2017-11-21 NOTE — Progress Notes (Signed)
D: Pt observed pacing the hall. Writer introduced herself. Pt was animated, and wide-eyed. When asked if he had any questions or concerns pt stated, "everybody is so nice and wonderful it's like a miracle healing since ive been in the hosp." When asked if he was hearing or seeing things he knows aren't really there, pt stated, "No ma'am. Just normal ears and eyes stuff". Pt has no questions or concerns.    A:  Support and encouragement was offered. 15 min checks continued for safety.  R: Pt remains safe.

## 2017-11-21 NOTE — Plan of Care (Signed)
  Problem: Education: Goal: Emotional status will improve Outcome: Progressing   Problem: Health Behavior/Discharge Planning: Goal: Compliance with prescribed medication regimen will improve Outcome: Progressing   Problem: Safety: Goal: Ability to remain free from injury will improve Outcome: Progressing  DAR NOTE: Patient presents with flat affect and depressed mood.  Denies suicidal thoughts, pain, auditory and visual hallucinations.  Described energy level as normal and concentration as good.  Rates depression at 0, hopelessness at 0, and anxiety at 0.  Maintained on routine safety checks.  Medications given as prescribed.  Support and encouragement offered as needed.  Attended group and participated.  Patient visible in milieu and is observed pacing the hallway.   Patient is safe on and off the unit.

## 2017-11-22 DIAGNOSIS — F200 Paranoid schizophrenia: Principal | ICD-10-CM

## 2017-11-22 LAB — GLUCOSE, CAPILLARY
GLUCOSE-CAPILLARY: 111 mg/dL — AB (ref 70–99)
Glucose-Capillary: 149 mg/dL — ABNORMAL HIGH (ref 70–99)
Glucose-Capillary: 99 mg/dL (ref 70–99)
Glucose-Capillary: 159 mg/dL — ABNORMAL HIGH (ref 70–99)

## 2017-11-22 NOTE — Progress Notes (Signed)
Recreation Therapy Notes  Date: 9.10.19 Time: 1000 Location: 500 Hall Dayroom  Group Topic: Anger Management  Goal Area(s) Addresses:  Patient will identify triggers for anger.  Patient will identify physical reaction to anger.   Patient will identify benefit of using coping skills when angry.  Behavioral Response: Engaged  Intervention: Worksheet, pencils  Activity: Anger Thermometer.  Patients were to rank at least 8 things that get them angry on the thermometer from 1- 10 ( 1 being the lowest and 10 being the highest).  Patients were to then identify five coping skills they can use to help them deal with their anger.  Education: Anger Management, Discharge Planning   Education Outcome: Acknowledges education/In group clarification offered/Needs additional education.   Clinical Observations/Feedback: Patient expressed the things that get him angry were being forced to do things, having his time wasted, evilness, uprising of anger and disrespect, taking the blame for other people's mistakes, being abused, being the victim of mistakes and delinquent behavior/activity.  Pt shared his coping skills as calling the police and defending himself against threats.     Victorino Sparrow, LRT/CTRS       Ria Comment, Naol Ontiveros A 11/22/2017 11:18 AM

## 2017-11-22 NOTE — [facility] Group Notes (Signed)
LCSW Group Therapy Note   11/22/2017 1:15pm   Type of Therapy and Topic:  Group Therapy:  Overcoming Obstacles   Participation Level:  Minimal   Description of Group:    In this group patients will be encouraged to explore what they see as obstacles to their own wellness and recovery. They will be guided to discuss their thoughts, feelings, and behaviors related to these obstacles. The group will process together ways to cope with barriers, with attention given to specific choices patients can make. Each patient will be challenged to identify changes they are motivated to make in order to overcome their obstacles. This group will be process-oriented, with patients participating in exploration of their own experiences as well as giving and receiving support and challenge from other group members.   Therapeutic Goals: 1. Patient will identify personal and current obstacles as they relate to admission. 2. Patient will identify barriers that currently interfere with their wellness or overcoming obstacles.  3. Patient will identify feelings, thought process and behaviors related to these barriers. 4. Patient will identify two changes they are willing to make to overcome these obstacles:      Summary of Patient Progress   In and out a couple of times, engaged while present.  "My biggest obstacle is when I don't have the proper tool for the job." Went on to give the example of needing a screwdriver, and not having one, and so he is not able to do the task.  Unable to come up with any alternatives or substitutes for a screwdriver.  Therapeutic Modalities:   Cognitive Behavioral Therapy Solution Focused Therapy Motivational Interviewing Relapse Prevention Therapy  Trish Mage, LCSW 11/22/2017 3:37 PM

## 2017-11-22 NOTE — Progress Notes (Signed)
Adult Psychoeducational Group Note  Date:  11/22/2017 Time:  10:24 AM  Group Topic/Focus:  Goals Group:   The focus of this group is to help patients establish daily goals to achieve during treatment and discuss how the patient can incorporate goal setting into their daily lives to aide in recovery.  Participation Level:  Minimal  Participation Quality:  Drowsy  Affect:  Appropriate  Cognitive:  Appropriate  Insight: Appropriate  Engagement in Group:  Lacking  Modes of Intervention:  Discussion  Additional Comments:  Pt attended group but did not share a goal.   [name] [name] 11/22/2017, 10:24 AM

## 2017-11-22 NOTE — Progress Notes (Signed)
D: Pt was less animated today than previous day. Pt attempted to explain his living situation as well as inform the Probation officer where he plans to live after discharge. Pt has no questions or concerns.    A:  Support and encouragement was offered. 15 min checks continued for safety.  R: Pt remains safe.

## 2017-11-22 NOTE — Progress Notes (Signed)
Adult Psychoeducational Group Note  Date:  11/22/2017 Time:  8:48 PM  Group Topic/Focus:  Wrap-Up Group:   The focus of this group is to help patients review their daily goal of treatment and discuss progress on daily workbooks.  Participation Level:  Active  Participation Quality:  Appropriate  Affect:  Appropriate  Cognitive:  Appropriate  Insight: Appropriate  Engagement in Group:  Engaged  Modes of Intervention:  Discussion  Additional Comments: The patient expressed that  He rates today a8 and attend group.  Nash Shearer 11/22/2017, 8:48 PM

## 2017-11-22 NOTE — Progress Notes (Addendum)
[facility] MD Progress Note  11/22/2017 2:06 PM [name] [name].  MRN:  [id number]  Subjective: [name] [name] reports, "I was sent here from the hospital because my blood sugar was so high. I was seeing a psychiatrist to reinforce my feelings. Basically, anxiety & depression are not my problem. I was told to come & wait for the doctor because I need my medicines titrated because they cause me to have dehydration. But, I'm not tasty now".  History Per assessment: -[name] Vaughnis an 52 y.o.malepresent to [facility] as a walk-in accompanied by his mother with complaints of bizarre behavior and auditory hallucinations. Per report from patient's mother she feels patient is having a psychotic break. Patient is a poor historian and speaks in a tangential language and appears to be delusional. Patient also displayed thought blocking. Patient denies suicidal / homicidal ideations and denies experiencing auditory / visual hallucinations. Patient report he taking his medication as prescribed but patient's mother feels patient is not taking his medication. Patient was last seen in the ED 08/09/2017 for bizarre behavior and last seen for an assessment 3.28.2019. Patient psychiatrist is Dr. Adele Schilder.  Today upon evaluation: Patient presents with tangential as well circumstantial stories similar to the previous evaluation as noted below. Pt was evaluated in the office. He shares, "I was noticing that my blood sugar was high, so that's why I was in the emergency room twice in a few days." Pt continues to assert that he was taken to the hospital by "a charlatan" that he describes as an impostor of his mother. He has no explanation why this person would take him to the hospital. Pt reports that he had been taking his medications prior to admission, despite history that he had been off of his medications. He reports that he is tolerating being resumed on remeron and risperdal without difficulty or side effects. He denies  SI/HI/AH/VH. He is in agreement to increase the dose of remeron last night. He was in agreement with the above plan and he had no further questions, comments, or concerns.  Principal Problem: Schizophrenia, paranoid type ([place]) Diagnosis:   Patient Active Problem List   Diagnosis Date Noted  . Schizophrenia, paranoid type ([place]) [F20.0] 11/18/2017  . DKA (diabetic ketoacidoses) ([place]) [E13.10] 11/05/2017  . Acute renal failure superimposed on stage 3 chronic kidney disease ([place]) [N17.9, N18.3] 11/05/2017  . Type II diabetes mellitus with renal manifestations ([place]) [E11.29] 11/05/2017  . Depression [F32.9] 11/05/2017  . Leukocytosis [D72.829] 11/05/2017  . Cough [R05] 11/05/2017  . Tobacco abuse [Z72.0] 11/05/2017  . Schizoaffective disorder, bipolar type ([place]) [F25.0] 06/10/2016  . DM (diabetes mellitus) type II uncontrolled, periph vascular disorder ([place]) [E11.51, E11.65] 02/29/2016  . Schizophrenia, disorganized type ([place]) [F20.1] 06/02/2015  . Hyperlipidemia [E78.5] 09/23/2014  . Renal insufficiency [N28.9] 09/23/2014  . HTN (hypertension) [I10] 03/22/2013  . Seasonal allergies [J30.2] 03/22/2013   Total Time spent with patient: 15 minutes  Past Psychiatric History: See H&P  Past Medical History:  Past Medical History:  Diagnosis Date  . Diabetes mellitus type II, uncontrolled ([place])   . HTN (hypertension)   . Hyperlipidemia LDL goal <70   . Schizophrenia ([place])    History reviewed. No pertinent surgical history.  Family History:  Family History  Problem Relation Age of Onset  . Breast cancer Mother   . Diabetes Mother   . Prostate cancer Father        StepFather  . Hypertension Father   . Breast cancer Maternal Aunt   .  Diabetes Maternal Uncle    Family Psychiatric  History: See H&P  Social History:  Social History   Substance and Sexual Activity  Alcohol Use Yes   Comment: occasional     Social History   Substance and Sexual Activity  Drug Use Yes  . Types:  Marijuana    Social History   Socioeconomic History  . Marital status: Single    Spouse name: Not on file  . Number of children: Not on file  . Years of education: Not on file  . Highest education level: Not on file  Occupational History  . Not on file  Social Needs  . Financial resource strain: Not on file  . Food insecurity:    Worry: Not on file    Inability: Not on file  . Transportation needs:    Medical: Not on file    Non-medical: Not on file  Tobacco Use  . Smoking status: Current Every Day Smoker    Packs/day: 2.00    Years: 30.00    Pack years: 60.00    Types: Cigarettes, Cigars  . Smokeless tobacco: Never Used  Substance and Sexual Activity  . Alcohol use: Yes    Comment: occasional  . Drug use: Yes    Types: Marijuana  . Sexual activity: Never  Lifestyle  . Physical activity:    Days per week: Not on file    Minutes per session: Not on file  . Stress: Not on file  Relationships  . Social connections:    Talks on phone: Not on file    Gets together: Not on file    Attends religious service: Not on file    Active member of club or organization: Not on file    Attends meetings of clubs or organizations: Not on file    Relationship status: Not on file  Other Topics Concern  . Not on file  Social History Narrative   Lives with mom   unemployed   No children   Additional Social History:   Sleep: Good  Appetite:  Good  Current Medications: Current Facility-Administered Medications  Medication Dose Route Frequency Provider Last Rate Last Dose  . acetaminophen (TYLENOL) tablet 650 mg  650 mg Oral Q6H PRN Ethelene Hal, NP   650 mg at 11/20/17 0134  . alum & mag hydroxide-simeth (MAALOX/MYLANTA) 200-200-20 MG/5ML suspension 30 mL  30 mL Oral Q4H PRN Ethelene Hal, NP      . hydrOXYzine (ATARAX/VISTARIL) tablet 25 mg  25 mg Oral TID PRN Ethelene Hal, NP   25 mg at 11/19/17 2132  . insulin aspart (novoLOG) injection 0-9 Units   0-9 Units Subcutaneous TID WC Ethelene Hal, NP   1 Units at 11/22/17 [phone]  . insulin glargine (LANTUS) injection 60 Units  60 Units Subcutaneous Daily Ethelene Hal, NP   60 Units at 11/22/17 5631  . loratadine (CLARITIN) tablet 10 mg  10 mg Oral Daily Ethelene Hal, NP   10 mg at 11/22/17 4970  . magnesium hydroxide (MILK OF MAGNESIA) suspension 30 mL  30 mL Oral Daily PRN Ethelene Hal, NP      . metoprolol tartrate (LOPRESSOR) tablet 25 mg  25 mg Oral BID Ethelene Hal, NP   25 mg at 11/22/17 0807  . mirtazapine (REMERON) tablet 7.5 mg  7.5 mg Oral QHS Cobos, Myer Peer, MD   7.5 mg at 11/21/17 2115  . mometasone-formoterol (DULERA) 200-5 MCG/ACT inhaler 2 puff  2  puff Inhalation BID Ethelene Hal, NP   2 puff at 11/22/17 1275  . nicotine polacrilex (NICORETTE) gum 2 mg  2 mg Oral PRN Derrill Center, NP   2 mg at 11/22/17 1046  . repaglinide (PRANDIN) tablet 2 mg  2 mg Oral BID AC Ethelene Hal, NP   2 mg at 11/22/17 0640  . risperiDONE (RISPERDAL) tablet 3 mg  3 mg Oral QHS Derrill Center, NP   3 mg at 11/21/17 2115   And  . risperiDONE (RISPERDAL) tablet 1 mg  1 mg Oral Q breakfast Derrill Center, NP   1 mg at 11/22/17 0807  . simvastatin (ZOCOR) tablet 40 mg  40 mg Oral QHS Ethelene Hal, NP   40 mg at 11/21/17 2115  . traZODone (DESYREL) tablet 50 mg  50 mg Oral QHS PRN Ethelene Hal, NP   50 mg at 11/19/17 2132  . Vitamin D (Ergocalciferol) (DRISDOL) capsule 50,000 Units  50,000 Units Oral Weekly Ethelene Hal, NP   50,000 Units at 11/21/17 0759   Lab Results:  Results for orders placed or performed during the hospital encounter of 11/18/17 (from the past 48 hour(s))  Glucose, capillary     Status: Abnormal   Collection Time: 11/20/17  5:08 PM  Result Value Ref Range   Glucose-Capillary 142 (H) 70 - 99 mg/dL  Glucose, capillary     Status: Abnormal   Collection Time: 11/20/17  8:40 PM  Result Value Ref  Range   Glucose-Capillary 110 (H) 70 - 99 mg/dL  Glucose, capillary     Status: Abnormal   Collection Time: 11/21/17  6:12 AM  Result Value Ref Range   Glucose-Capillary 104 (H) 70 - 99 mg/dL  Glucose, capillary     Status: Abnormal   Collection Time: 11/21/17  5:01 PM  Result Value Ref Range   Glucose-Capillary 126 (H) 70 - 99 mg/dL  Glucose, capillary     Status: Abnormal   Collection Time: 11/21/17  8:24 PM  Result Value Ref Range   Glucose-Capillary 108 (H) 70 - 99 mg/dL  Glucose, capillary     Status: Abnormal   Collection Time: 11/22/17  6:06 AM  Result Value Ref Range   Glucose-Capillary 149 (H) 70 - 99 mg/dL  Glucose, capillary     Status: Abnormal   Collection Time: 11/22/17 12:02 PM  Result Value Ref Range   Glucose-Capillary 111 (H) 70 - 99 mg/dL   Blood Alcohol level:  Lab Results  Component Value Date   ETH <10 11/17/2017   ETH <10 17/00/1749   Metabolic Disorder Labs: Lab Results  Component Value Date   HGBA1C 10.8 (H) 11/20/2017   MPG 263.26 11/20/2017   MPG 278 11/06/2017   Lab Results  Component Value Date   PROLACTIN 63.7 (H) 11/20/2017   Lab Results  Component Value Date   CHOL 116 11/20/2017   TRIG 108 11/20/2017   HDL 41 11/20/2017   CHOLHDL 2.8 11/20/2017   VLDL 22 11/20/2017   LDLCALC 53 11/20/2017   LDLCALC 66 09/30/2017   Physical Findings: AIMS: Facial and Oral Movements Muscles of Facial Expression: None, normal Lips and Perioral Area: None, normal Jaw: None, normal Tongue: None, normal,Extremity Movements Upper (arms, wrists, hands, fingers): None, normal Lower (legs, knees, ankles, toes): None, normal, Trunk Movements Neck, shoulders, hips: None, normal, Overall Severity Severity of abnormal movements (highest score from questions above): None, normal Incapacitation due to abnormal movements: None, normal Patient's awareness  of abnormal movements (rate only patient's report): No Awareness, Dental Status Current problems with  teeth and/or dentures?: No Does patient usually wear dentures?: No  CIWA:    COWS:     Musculoskeletal: Strength & Muscle Tone: within normal limits Gait & Station: normal Patient leans: N/A  Psychiatric Specialty Exam: Physical Exam  Nursing note and vitals reviewed.   Review of Systems  Psychiatric/Behavioral: Positive for hallucinations (Hx. psychosis). Negative for depression, memory loss, substance abuse and suicidal ideas. The patient is not nervous/anxious and does not have insomnia.     Blood pressure 127/89, pulse 89, temperature 98.4 F (36.9 C), resp. rate 20, height 5\' 10"  (1.778 m), weight 108 kg, SpO2 98 %.Body mass index is 34.15 kg/m.  General Appearance: Casual and Disheveled  Eye Contact:  Good  Speech:  Clear and Coherent and Normal Rate  Volume:  Normal  Mood:  Euthymic  Affect:  Blunt and Flat  Thought Process:  Coherent and Goal Directed  Orientation:  Full (Time, Place, and Person)  Thought Content:  Delusions and Paranoid Ideation  Suicidal Thoughts:  No  Homicidal Thoughts:  No  Memory:  Immediate;   Fair Recent;   Fair Remote;   Fair  Judgement:  Poor  Insight:  Lacking  Psychomotor Activity:  Normal  Concentration:  Concentration: Fair  Recall:  [name] of Knowledge:  Fair  Language:  Fair  Akathisia:  No  Handed:    AIMS (if indicated):     Assets:  Physical Health Resilience Social Support  ADL's:  Intact  Cognition:  WNL  Sleep:  Number of Hours: 3   Treatment Plan Summary: Daily contact with patient to assess and evaluate symptoms and progress in treatment and Medication management   - Continue inpatient hospitalization.  -Will continue today 11/22/2017 plan as below except where it is noted.  -Schizophrenia   -Continue Risperdal 1mg  po qAM +3 mg po qhs  Insomnia                         -Continue with Trazodone 50 mg qhs prn insomnia                         - Continue remeron 15mg  po qhs                          -HLD    -Continue simvastatin 40mg  po qDay  -COPD   -Continue dulera 200-5 mcg/act take 2 puffs BID  -HTN   -Continue lopressor 25mg  po BID  -seasonal allergy   -Continue claritin 10mg  po qDay  -DMII   -Continue current SSI orders with lantus and novolog   -Continue repalinide 2mg  po BID  -anxiety  -Continue vistaril 25mg  po TID prn anxiety  Will continue to monitor vitals ,medication compliance and treatment side effects while patient is here.   Reviewed labs Glucose elevated 120,BAL -  UDS -  CSW will continue working on disposition.  Patient to participate in therapeutic milieu  Lindell Spar, NP, PMHNP, FNP-BC. 11/22/2017, 2:06 PMPatient ID: [name] [name]., male   DOB: June 06, 1965, 52 y.o.   MRN: [id number]

## 2017-11-22 NOTE — Plan of Care (Signed)
  Problem: Education: Goal: Emotional status will improve Outcome: Progressing   Problem: Health Behavior/Discharge Planning: Goal: Compliance with prescribed medication regimen will improve Outcome: Progressing  DAR NOTE: Patient presents with calm affect and pleasant mood.  Denies suicidal thoughts, pain, auditory and visual hallucinations.  Described energy level as normal and concentration as good.  Rates depression at 0, hopelessness at 0, and anxiety at 0.  Maintained on routine safety checks.  Medications given as prescribed.  Support and encouragement offered as needed.  Attended group and participated.  Patient observed socializing with peers in the dayroom.  Patient is safe on and off the unit.  Offered no complaint.

## 2017-11-23 ENCOUNTER — Other Ambulatory Visit (HOSPITAL_COMMUNITY): Payer: Self-pay | Admitting: Psychiatry

## 2017-11-23 DIAGNOSIS — F209 Schizophrenia, unspecified: Secondary | ICD-10-CM

## 2017-11-23 LAB — GLUCOSE, CAPILLARY
GLUCOSE-CAPILLARY: 176 mg/dL — AB (ref 70–99)
GLUCOSE-CAPILLARY: 127 mg/dL — AB (ref 70–99)
Glucose-Capillary: 105 mg/dL — ABNORMAL HIGH (ref 70–99)
Glucose-Capillary: 250 mg/dL — ABNORMAL HIGH (ref 70–99)
Glucose-Capillary: 217 mg/dL — ABNORMAL HIGH (ref 70–99)

## 2017-11-23 NOTE — Progress Notes (Signed)
Recreation Therapy Notes  Date: 9.11.19 Time: 1000 Location: [address] Dayroom  Group Topic: Music Therapy  Goal Area(s) Addresses:  Patient will identify the benefits of listening to relaxing music. Patient will identify music that can help you relax.  Behavioral Response: Engaged  Intervention:  Music  Activity:  Music Therapy.  LRT played some soft music the patients.  Patients had the opportunity to request a soothing song that they wanted played for the group.  Education: Communication, Discharge Planning  Education Outcome: Acknowledges understanding/In group clarification offered/Needs additional education.   Clinical Observations/Feedback: Pt arrived a little late but got involved.  Pt didn't make any request but stated that music can be used as a coping skill and help you relax.  Pt was also observed swaying along to the music.   Victorino Sparrow, LRT/CTRS         Victorino Sparrow A 11/23/2017 12:23 PM

## 2017-11-23 NOTE — Progress Notes (Signed)
[facility] MD Progress Note  11/23/2017 11:52 AM [name] [name].  MRN:  [id number]  Subjective: [name] [name] reports, "It is going well. I'm just feeling more tired today than usual. I slept well last night. My mood is pretty good. I'm attending group sessions. The medicines are fine, no side effects".   History Per assessment: -[name] Vaughnis an 52 y.o.malepresent to [facility] as a walk-in accompanied by his mother with complaints of bizarre behavior and auditory hallucinations. Per report from patient's mother she feels patient is having a psychotic break. Patient is a poor historian and speaks in a tangential language and appears to be delusional. Patient also displayed thought blocking. Patient denies suicidal / homicidal ideations and denies experiencing auditory / visual hallucinations. Patient report he taking his medication as prescribed but patient's mother feels patient is not taking his medication. Patient was last seen in the ED 08/09/2017 for bizarre behavior and last seen for an assessment 3.28.2019. Patient psychiatrist is Dr. Adele Schilder.  Today upon evaluation: Pt was evaluated in his room. He presents more clearer than yesterday. He says he is doing well, only feeling more tired than usual. He is visible on the unit, attending group sessions. He reports that he is tolerating his medications without difficulty or side effects. He denies SI/HI/AH/VH. He was in agreement to continue his current plan of care as already in progress. He had no further questions, comments, or concerns.  Principal Problem: Schizophrenia, paranoid type ([place]) Diagnosis:   Patient Active Problem List   Diagnosis Date Noted  . Schizophrenia, paranoid type ([place]) [F20.0] 11/18/2017  . DKA (diabetic ketoacidoses) ([place]) [E13.10] 11/05/2017  . Acute renal failure superimposed on stage 3 chronic kidney disease ([place]) [N17.9, N18.3] 11/05/2017  . Type II diabetes mellitus with renal manifestations ([place]) [E11.29]  11/05/2017  . Depression [F32.9] 11/05/2017  . Leukocytosis [D72.829] 11/05/2017  . Cough [R05] 11/05/2017  . Tobacco abuse [Z72.0] 11/05/2017  . Schizoaffective disorder, bipolar type ([place]) [F25.0] 06/10/2016  . DM (diabetes mellitus) type II uncontrolled, periph vascular disorder ([place]) [E11.51, E11.65] 02/29/2016  . Schizophrenia, disorganized type ([place]) [F20.1] 06/02/2015  . Hyperlipidemia [E78.5] 09/23/2014  . Renal insufficiency [N28.9] 09/23/2014  . HTN (hypertension) [I10] 03/22/2013  . Seasonal allergies [J30.2] 03/22/2013   Total Time spent with patient: 15 minutes  Past Psychiatric History: See H&P  Past Medical History:  Past Medical History:  Diagnosis Date  . Diabetes mellitus type II, uncontrolled ([place])   . HTN (hypertension)   . Hyperlipidemia LDL goal <70   . Schizophrenia ([place])    History reviewed. No pertinent surgical history.  Family History:  Family History  Problem Relation Age of Onset  . Breast cancer Mother   . Diabetes Mother   . Prostate cancer Father        StepFather  . Hypertension Father   . Breast cancer Maternal Aunt   . Diabetes Maternal Uncle    Family Psychiatric  History: See H&P  Social History:  Social History   Substance and Sexual Activity  Alcohol Use Yes   Comment: occasional     Social History   Substance and Sexual Activity  Drug Use Yes  . Types: Marijuana    Social History   Socioeconomic History  . Marital status: Single    Spouse name: Not on file  . Number of children: Not on file  . Years of education: Not on file  . Highest education level: Not on file  Occupational History  . Not on file  Social Needs  . Financial resource strain: Not on file  . Food insecurity:    Worry: Not on file    Inability: Not on file  . Transportation needs:    Medical: Not on file    Non-medical: Not on file  Tobacco Use  . Smoking status: Current Every Day Smoker    Packs/day: 2.00    Years: 30.00    Pack years:  60.00    Types: Cigarettes, Cigars  . Smokeless tobacco: Never Used  Substance and Sexual Activity  . Alcohol use: Yes    Comment: occasional  . Drug use: Yes    Types: Marijuana  . Sexual activity: Never  Lifestyle  . Physical activity:    Days per week: Not on file    Minutes per session: Not on file  . Stress: Not on file  Relationships  . Social connections:    Talks on phone: Not on file    Gets together: Not on file    Attends religious service: Not on file    Active member of club or organization: Not on file    Attends meetings of clubs or organizations: Not on file    Relationship status: Not on file  Other Topics Concern  . Not on file  Social History Narrative   Lives with mom   unemployed   No children   Additional Social History:   Sleep: Good  Appetite:  Good  Current Medications: Current Facility-Administered Medications  Medication Dose Route Frequency Provider Last Rate Last Dose  . acetaminophen (TYLENOL) tablet 650 mg  650 mg Oral Q6H PRN Ethelene Hal, NP   650 mg at 11/20/17 0134  . alum & mag hydroxide-simeth (MAALOX/MYLANTA) 200-200-20 MG/5ML suspension 30 mL  30 mL Oral Q4H PRN Ethelene Hal, NP      . hydrOXYzine (ATARAX/VISTARIL) tablet 25 mg  25 mg Oral TID PRN Ethelene Hal, NP   25 mg at 11/19/17 2132  . insulin aspart (novoLOG) injection 0-9 Units  0-9 Units Subcutaneous TID WC Ethelene Hal, NP   1 Units at 11/22/17 [phone]  . insulin glargine (LANTUS) injection 60 Units  60 Units Subcutaneous Daily Ethelene Hal, NP   60 Units at 11/23/17 0800  . loratadine (CLARITIN) tablet 10 mg  10 mg Oral Daily Ethelene Hal, NP   10 mg at 11/23/17 0800  . magnesium hydroxide (MILK OF MAGNESIA) suspension 30 mL  30 mL Oral Daily PRN Ethelene Hal, NP      . metoprolol tartrate (LOPRESSOR) tablet 25 mg  25 mg Oral BID Ethelene Hal, NP   25 mg at 11/23/17 0800  . mirtazapine (REMERON) tablet  7.5 mg  7.5 mg Oral QHS Cobos, Myer Peer, MD   7.5 mg at 11/22/17 2138  . mometasone-formoterol (DULERA) 200-5 MCG/ACT inhaler 2 puff  2 puff Inhalation BID Ethelene Hal, NP   2 puff at 11/23/17 0759  . nicotine polacrilex (NICORETTE) gum 2 mg  2 mg Oral PRN Derrill Center, NP   2 mg at 11/23/17 0759  . repaglinide (PRANDIN) tablet 2 mg  2 mg Oral BID AC Ethelene Hal, NP   2 mg at 11/23/17 1751  . risperiDONE (RISPERDAL) tablet 3 mg  3 mg Oral QHS Derrill Center, NP   3 mg at 11/22/17 2138   And  . risperiDONE (RISPERDAL) tablet 1 mg  1 mg Oral Q breakfast Derrill Center, NP  1 mg at 11/23/17 0800  . simvastatin (ZOCOR) tablet 40 mg  40 mg Oral QHS Ethelene Hal, NP   40 mg at 11/22/17 2138  . traZODone (DESYREL) tablet 50 mg  50 mg Oral QHS PRN Ethelene Hal, NP   50 mg at 11/19/17 2132  . Vitamin D (Ergocalciferol) (DRISDOL) capsule 50,000 Units  50,000 Units Oral Weekly Ethelene Hal, NP   50,000 Units at 11/21/17 0759   Lab Results:  Results for orders placed or performed during the hospital encounter of 11/18/17 (from the past 48 hour(s))  Glucose, capillary     Status: Abnormal   Collection Time: 11/21/17 12:05 PM  Result Value Ref Range   Glucose-Capillary 176 (H) 70 - 99 mg/dL  Glucose, capillary     Status: Abnormal   Collection Time: 11/21/17  5:01 PM  Result Value Ref Range   Glucose-Capillary 126 (H) 70 - 99 mg/dL  Glucose, capillary     Status: Abnormal   Collection Time: 11/21/17  8:24 PM  Result Value Ref Range   Glucose-Capillary 108 (H) 70 - 99 mg/dL  Glucose, capillary     Status: Abnormal   Collection Time: 11/22/17  6:06 AM  Result Value Ref Range   Glucose-Capillary 149 (H) 70 - 99 mg/dL  Glucose, capillary     Status: Abnormal   Collection Time: 11/22/17 12:02 PM  Result Value Ref Range   Glucose-Capillary 111 (H) 70 - 99 mg/dL  Glucose, capillary     Status: None   Collection Time: 11/22/17  5:16 PM  Result Value  Ref Range   Glucose-Capillary 99 70 - 99 mg/dL  Glucose, capillary     Status: Abnormal   Collection Time: 11/22/17  9:01 PM  Result Value Ref Range   Glucose-Capillary 159 (H) 70 - 99 mg/dL  Glucose, capillary     Status: Abnormal   Collection Time: 11/23/17  5:47 AM  Result Value Ref Range   Glucose-Capillary 105 (H) 70 - 99 mg/dL   Blood Alcohol level:  Lab Results  Component Value Date   ETH <10 11/17/2017   ETH <10 54/62/7035   Metabolic Disorder Labs: Lab Results  Component Value Date   HGBA1C 10.8 (H) 11/20/2017   MPG 263.26 11/20/2017   MPG 278 11/06/2017   Lab Results  Component Value Date   PROLACTIN 63.7 (H) 11/20/2017   Lab Results  Component Value Date   CHOL 116 11/20/2017   TRIG 108 11/20/2017   HDL 41 11/20/2017   CHOLHDL 2.8 11/20/2017   VLDL 22 11/20/2017   LDLCALC 53 11/20/2017   LDLCALC 66 09/30/2017   Physical Findings: AIMS: Facial and Oral Movements Muscles of Facial Expression: None, normal Lips and Perioral Area: None, normal Jaw: None, normal Tongue: None, normal,Extremity Movements Upper (arms, wrists, hands, fingers): None, normal Lower (legs, knees, ankles, toes): None, normal, Trunk Movements Neck, shoulders, hips: None, normal, Overall Severity Severity of abnormal movements (highest score from questions above): None, normal Incapacitation due to abnormal movements: None, normal Patient's awareness of abnormal movements (rate only patient's report): No Awareness, Dental Status Current problems with teeth and/or dentures?: No Does patient usually wear dentures?: No  CIWA:    COWS:     Musculoskeletal: Strength & Muscle Tone: within normal limits Gait & Station: normal Patient leans: N/A  Psychiatric Specialty Exam: Physical Exam  Nursing note and vitals reviewed.   Review of Systems  Psychiatric/Behavioral: Positive for hallucinations (Hx. psychosis). Negative for depression, memory loss,  substance abuse and suicidal  ideas. The patient is not nervous/anxious and does not have insomnia.     Blood pressure 117/81, pulse 96, temperature 98.1 F (36.7 C), resp. rate 16, height 5\' 10"  (1.778 m), weight 108 kg, SpO2 98 %.Body mass index is 34.15 kg/m.  General Appearance: Casual and Disheveled  Eye Contact:  Good  Speech:  Clear and Coherent and Normal Rate  Volume:  Normal  Mood:  Euthymic  Affect:  Blunt and Flat  Thought Process:  Coherent and Goal Directed  Orientation:  Full (Time, Place, and Person)  Thought Content:  Delusions and Paranoid Ideation  Suicidal Thoughts:  No  Homicidal Thoughts:  No  Memory:  Immediate;   Fair Recent;   Fair Remote;   Fair  Judgement:  Poor  Insight:  Lacking  Psychomotor Activity:  Normal  Concentration:  Concentration: Fair  Recall:  [name] of Knowledge:  Fair  Language:  Fair  Akathisia:  No  Handed:    AIMS (if indicated):     Assets:  Physical Health Resilience Social Support  ADL's:  Intact  Cognition:  WNL  Sleep:  Number of Hours: 6.25   Treatment Plan Summary: Daily contact with patient to assess and evaluate symptoms and progress in treatment and Medication management   - Continue inpatient hospitalization.  -Will continue today 11/23/2017 plan as below except where it is noted.  -Schizophrenia   -Continue Risperdal 1mg  po qAM +3 mg po qhs  Insomnia                         -Continue with Trazodone 50 mg qhs prn insomnia                         - Continue remeron 15mg  po qhs                          -HLD   -Continue simvastatin 40mg  po qDay  -COPD   -Continue dulera 200-5 mcg/act take 2 puffs BID  -HTN   -Continue lopressor 25mg  po BID  -seasonal allergy   -Continue claritin 10mg  po qDay  -DMII   -Continue current SSI orders with lantus and novolog   -Continue repalinide 2mg  po BID  -anxiety  -Continue vistaril 25mg  po TID prn anxiety  Will continue to monitor vitals ,medication compliance and treatment side effects  while patient is here.   Reviewed labs Glucose elevated 120,BAL -  UDS -  CSW will continue working on disposition.  Patient to participate in therapeutic milieu  Lindell Spar, NP, PMHNP, FNP-BC. 11/23/2017, 11:52 AMPatient ID: [name] [name]., male   DOB: 29-Apr-1965, 52 y.o.   MRN: [id number]

## 2017-11-23 NOTE — [facility] Group Notes (Signed)
LCSW Group Therapy Note  11/23/2017 1:15pm  Type of Therapy/Topic:  Group Therapy:  Emotion Regulation  Participation Level:  Active   Description of Group:   The purpose of this group is to assist patients in learning to regulate negative emotions and experience positive emotions. Patients will be guided to discuss ways in which they have been vulnerable to their negative emotions. These vulnerabilities will be juxtaposed with experiences of positive emotions or situations, and patients will be challenged to use positive emotions to combat negative ones. Special emphasis will be placed on coping with negative emotions in conflict situations, and patients will process healthy conflict resolution skills.  Therapeutic Goals: 1. Patient will identify two positive emotions or experiences to reflect on in order to balance out negative emotions 2. Patient will label two or more emotions that they find the most difficult to experience 3. Patient will demonstrate positive conflict resolution skills through discussion and/or role plays  Summary of Patient Progress:  [name] [name] attended entire session. He was appropriate and participated in the discussion. Engaged with the activity.     Therapeutic Modalities:   Cognitive Behavioral Therapy Feelings Identification Dialectical Behavioral Therapy   Trish Mage, LCSW 11/23/2017 1:51 PM

## 2017-11-23 NOTE — Progress Notes (Signed)
Nursing Progress Note: 7p-7a D: Pt currently presents with a intense/glaring/staring/guarded/tangential/minimal affect and behavior. Interacting minimally with the milieu. Pt reports good sleep during the previous night with current medication regimen. Pt did attend wrap-up group.  A: Pt provided with medications per providers orders. Pt's labs and vitals were monitored throughout the night. Pt supported emotionally and encouraged to express concerns and questions. Pt educated on medications.  R: Pt's safety ensured with 15 minute and environmental checks. Pt currently denies SI, HI, and AVH. Pt verbally contracts to seek staff if SI,HI, or AVH occurs and to consult with staff before acting on any harmful thoughts. Will continue to monitor.

## 2017-11-23 NOTE — Progress Notes (Signed)
Patient has had decreased disorganization and intrusive behaviors.  Patient denies SI, HI and AVH.  Patient has attended groups and participated in unit activities.   Assess patient for safety offer medications as prescribed engage patient in 1:1 staff talks.   Continue to monitor as planned. Patient able to contract for safety.

## 2017-11-24 LAB — GLUCOSE, CAPILLARY
GLUCOSE-CAPILLARY: 146 mg/dL — AB (ref 70–99)
GLUCOSE-CAPILLARY: 111 mg/dL — AB (ref 70–99)
GLUCOSE-CAPILLARY: 185 mg/dL — AB (ref 70–99)
Glucose-Capillary: 252 mg/dL — ABNORMAL HIGH (ref 70–99)

## 2017-11-24 NOTE — Progress Notes (Signed)
Nursing Progress Note: 7p-7a D: Pt currently presents with a anxious/disorganized/glaring/intense stare/tangential affect and behavior. Pt states "I am doing very well." Interacting appropriately with the milieu. Pt reports good sleep during the previous night with current medication regimen. Pt did attend wrap-up group.  A: Pt provided with medications per providers orders. Pt's labs and vitals were monitored throughout the night. Pt supported emotionally and encouraged to express concerns and questions. Pt educated on medications.  R: Pt's safety ensured with 15 minute and environmental checks. Pt currently denies SI, HI, and AVH. Pt verbally contracts to seek staff if SI,HI, or AVH occurs and to consult with staff before acting on any harmful thoughts. Will continue to monitor.

## 2017-11-24 NOTE — Progress Notes (Signed)
Patient has had decreased disorganization and intrusive behaviors.  Patient denies SI, HI and AVH.  Patient has attended groups and participated in unit activities.   Assess patient for safety offer medications as prescribed engage patient in 1:1 staff talks.   Continue to monitor as planned. Patient able to contract for safety.

## 2017-11-24 NOTE — [facility] Group Notes (Signed)
LCSW Group Therapy Note  11/24/2017 1:15pm  Type of Therapy/Topic:  Group Therapy:  Balance in Life  Participation Level:  Active  Description of Group:    This group will address the concept of balance and how it feels and looks when one is unbalanced. Patients will be encouraged to process areas in their lives that are out of balance and identify reasons for remaining unbalanced. Facilitators will guide patients in utilizing problem-solving interventions to address and correct the stressor making their life unbalanced. Understanding and applying boundaries will be explored and addressed for obtaining and maintaining a balanced life. Patients will be encouraged to explore ways to assertively make their unbalanced needs known to significant others in their lives, using other group members and facilitator for support and feedback.  Therapeutic Goals: 1. Patient will identify two or more emotions or situations they have that consume much of in their lives. 2. Patient will identify signs/triggers that life has become out of balance:  3. Patient will identify two ways to set boundaries in order to achieve balance in their lives:  4. Patient will demonstrate ability to communicate their needs through discussion and/or role plays  Summary of Patient Progress:  He described feeling in harmony, calm and a sense of wellness. Contributed to the discussion when asked to participate. Described being grateful for balance.   Therapeutic Modalities:   Cognitive Behavioral Therapy Solution-Focused Therapy Assertiveness Training  Trish Mage, [place] 11/24/2017 1:40 PM

## 2017-11-24 NOTE — Progress Notes (Signed)
Recreation Therapy Notes  Date: 9.12.19 Time: 0955 Location: [address] Dayroom  Group Topic: Self-Esteem  Goal Area(s) Addresses:  Patient will successfully identify positive attributes about themselves.  Patient will successfully identify benefit of improved self-esteem.   Behavioral Response: Engaged  Intervention: Worksheet, colored pencils, markers  Activity: Customized Plates.  Patients were given a blank outline of a license plate.  Patients were to design the plate with things that describe them such as birth dates, favorite foods, important dates, accomplishments, things that make them unique or things they are proud of.  Education:  Self-Esteem, Dentist.   Education Outcome: Acknowledges education/In group clarification offered/Needs additional education  Clinical Observations/Feedback: Pt arrived late.  Pt placed his name and birth date on his plate.  Pt also stated he was in "[place]".  Pt was bright and engaged throughout group.    Victorino Sparrow, LRT/CTRS    Victorino Sparrow A 11/24/2017 11:38 AM

## 2017-11-24 NOTE — Progress Notes (Signed)
[facility] MD Progress Note  11/24/2017 5:09 PM [name] [name].  MRN:  [id number] Subjective:    HistoryPer assessment:-[name] Vaughnis an 52 y.o.malepresent to [facility] as a walk-in accompanied by his mother with complaints of bizarre behavior and auditory hallucinations. Per report from patient's mother she feels patient is having a psychotic break. Patient is a poor historian and speaks in a tangential language and appears to be delusional. Patient also displayed thought blocking. Patient denies suicidal / homicidal ideations and denies experiencing auditory / visual hallucinations. Patient report he taking his medication as prescribed but patient's mother feels patient is not taking his medication. Patient was last seen in the ED 08/09/2017 for bizarre behavior and last seen for an assessment 3.28.2019. Patient psychiatrist is Dr. Adele Schilder.  Today upon evaluation: Pt shares, "I'm doing pretty good." He denies any specific concerns today. He is sleeping well. His appetite is good. He denies other physical complaints. He denies SI/HI/AH/VH. He is tolerating his medications well and he is in agreement to continue his current regimen without changes. Discussed with patient that his mother has concerns about him returning to live at home, and pt shares about previous assisted living experiences. He is open to discussing with SW team about options for referral to group home or a similar setting. He was in agreement with the above plan and he had no further questions, comments, or concerns.  Principal Problem: Schizophrenia, paranoid type ([place]) Diagnosis:   Patient Active Problem List   Diagnosis Date Noted  . Schizophrenia, paranoid type ([place]) [F20.0] 11/18/2017  . DKA (diabetic ketoacidoses) ([place]) [E13.10] 11/05/2017  . Acute renal failure superimposed on stage 3 chronic kidney disease ([place]) [N17.9, N18.3] 11/05/2017  . Type II diabetes mellitus with renal manifestations ([place]) [E11.29]  11/05/2017  . Depression [F32.9] 11/05/2017  . Leukocytosis [D72.829] 11/05/2017  . Cough [R05] 11/05/2017  . Tobacco abuse [Z72.0] 11/05/2017  . Schizoaffective disorder, bipolar type ([place]) [F25.0] 06/10/2016  . DM (diabetes mellitus) type II uncontrolled, periph vascular disorder ([place]) [E11.51, E11.65] 02/29/2016  . Schizophrenia, disorganized type ([place]) [F20.1] 06/02/2015  . Hyperlipidemia [E78.5] 09/23/2014  . Renal insufficiency [N28.9] 09/23/2014  . HTN (hypertension) [I10] 03/22/2013  . Seasonal allergies [J30.2] 03/22/2013   Total Time spent with patient: 30 minutes  Past Psychiatric History: see H&P  Past Medical History:  Past Medical History:  Diagnosis Date  . Diabetes mellitus type II, uncontrolled ([place])   . HTN (hypertension)   . Hyperlipidemia LDL goal <70   . Schizophrenia ([place])    History reviewed. No pertinent surgical history. Family History:  Family History  Problem Relation Age of Onset  . Breast cancer Mother   . Diabetes Mother   . Prostate cancer Father        StepFather  . Hypertension Father   . Breast cancer Maternal Aunt   . Diabetes Maternal Uncle    Family Psychiatric  History: see H&P Social History:  Social History   Substance and Sexual Activity  Alcohol Use Yes   Comment: occasional     Social History   Substance and Sexual Activity  Drug Use Yes  . Types: Marijuana    Social History   Socioeconomic History  . Marital status: Single    Spouse name: Not on file  . Number of children: Not on file  . Years of education: Not on file  . Highest education level: Not on file  Occupational History  . Not on file  Social Needs  . Financial  resource strain: Not on file  . Food insecurity:    Worry: Not on file    Inability: Not on file  . Transportation needs:    Medical: Not on file    Non-medical: Not on file  Tobacco Use  . Smoking status: Current Every Day Smoker    Packs/day: 2.00    Years: 30.00    Pack years: 60.00     Types: Cigarettes, Cigars  . Smokeless tobacco: Never Used  Substance and Sexual Activity  . Alcohol use: Yes    Comment: occasional  . Drug use: Yes    Types: Marijuana  . Sexual activity: Never  Lifestyle  . Physical activity:    Days per week: Not on file    Minutes per session: Not on file  . Stress: Not on file  Relationships  . Social connections:    Talks on phone: Not on file    Gets together: Not on file    Attends religious service: Not on file    Active member of club or organization: Not on file    Attends meetings of clubs or organizations: Not on file    Relationship status: Not on file  Other Topics Concern  . Not on file  Social History Narrative   Lives with mom   unemployed   No children   Additional Social History:                         Sleep: Good  Appetite:  Good  Current Medications: Current Facility-Administered Medications  Medication Dose Route Frequency Provider Last Rate Last Dose  . acetaminophen (TYLENOL) tablet 650 mg  650 mg Oral Q6H PRN Ethelene Hal, NP   650 mg at 11/20/17 0134  . alum & mag hydroxide-simeth (MAALOX/MYLANTA) 200-200-20 MG/5ML suspension 30 mL  30 mL Oral Q4H PRN Ethelene Hal, NP      . hydrOXYzine (ATARAX/VISTARIL) tablet 25 mg  25 mg Oral TID PRN Ethelene Hal, NP   25 mg at 11/19/17 2132  . insulin aspart (novoLOG) injection 0-9 Units  0-9 Units Subcutaneous TID WC Ethelene Hal, NP   3 Units at 11/24/17 1657  . insulin glargine (LANTUS) injection 60 Units  60 Units Subcutaneous Daily Ethelene Hal, NP   60 Units at 11/24/17 [phone]  . loratadine (CLARITIN) tablet 10 mg  10 mg Oral Daily Ethelene Hal, NP   10 mg at 11/24/17 4970  . magnesium hydroxide (MILK OF MAGNESIA) suspension 30 mL  30 mL Oral Daily PRN Ethelene Hal, NP      . metoprolol tartrate (LOPRESSOR) tablet 25 mg  25 mg Oral BID Ethelene Hal, NP   25 mg at 11/24/17 1657  .  mirtazapine (REMERON) tablet 7.5 mg  7.5 mg Oral QHS Cobos, Myer Peer, MD   7.5 mg at 11/23/17 2158  . mometasone-formoterol (DULERA) 200-5 MCG/ACT inhaler 2 puff  2 puff Inhalation BID Ethelene Hal, NP   2 puff at 11/24/17 [phone]  . nicotine polacrilex (NICORETTE) gum 2 mg  2 mg Oral PRN Derrill Center, NP   2 mg at 11/24/17 8588  . repaglinide (PRANDIN) tablet 2 mg  2 mg Oral BID AC Ethelene Hal, NP   2 mg at 11/24/17 1657  . risperiDONE (RISPERDAL) tablet 3 mg  3 mg Oral QHS Derrill Center, NP   3 mg at 11/23/17 2158   And  .  risperiDONE (RISPERDAL) tablet 1 mg  1 mg Oral Q breakfast Derrill Center, NP   1 mg at 11/24/17 1607  . simvastatin (ZOCOR) tablet 40 mg  40 mg Oral QHS Ethelene Hal, NP   40 mg at 11/23/17 2158  . traZODone (DESYREL) tablet 50 mg  50 mg Oral QHS PRN Ethelene Hal, NP   50 mg at 11/19/17 2132  . Vitamin D (Ergocalciferol) (DRISDOL) capsule 50,000 Units  50,000 Units Oral Weekly Ethelene Hal, NP   50,000 Units at 11/21/17 0759    Lab Results:  Results for orders placed or performed during the hospital encounter of 11/18/17 (from the past 48 hour(s))  Glucose, capillary     Status: None   Collection Time: 11/22/17  5:16 PM  Result Value Ref Range   Glucose-Capillary 99 70 - 99 mg/dL  Glucose, capillary     Status: Abnormal   Collection Time: 11/22/17  9:01 PM  Result Value Ref Range   Glucose-Capillary 159 (H) 70 - 99 mg/dL  Glucose, capillary     Status: Abnormal   Collection Time: 11/23/17  5:47 AM  Result Value Ref Range   Glucose-Capillary 105 (H) 70 - 99 mg/dL  Glucose, capillary     Status: Abnormal   Collection Time: 11/23/17 12:03 PM  Result Value Ref Range   Glucose-Capillary 127 (H) 70 - 99 mg/dL  Glucose, capillary     Status: Abnormal   Collection Time: 11/23/17  4:52 PM  Result Value Ref Range   Glucose-Capillary 250 (H) 70 - 99 mg/dL  Glucose, capillary     Status: Abnormal   Collection Time:  11/23/17  9:06 PM  Result Value Ref Range   Glucose-Capillary 217 (H) 70 - 99 mg/dL  Glucose, capillary     Status: Abnormal   Collection Time: 11/24/17  6:26 AM  Result Value Ref Range   Glucose-Capillary 146 (H) 70 - 99 mg/dL  Glucose, capillary     Status: Abnormal   Collection Time: 11/24/17 11:56 AM  Result Value Ref Range   Glucose-Capillary 111 (H) 70 - 99 mg/dL  Glucose, capillary     Status: Abnormal   Collection Time: 11/24/17  4:49 PM  Result Value Ref Range   Glucose-Capillary 252 (H) 70 - 99 mg/dL    Blood Alcohol level:  Lab Results  Component Value Date   ETH <10 11/17/2017   ETH <10 37/12/6267    Metabolic Disorder Labs: Lab Results  Component Value Date   HGBA1C 10.8 (H) 11/20/2017   MPG 263.26 11/20/2017   MPG 278 11/06/2017   Lab Results  Component Value Date   PROLACTIN 63.7 (H) 11/20/2017   Lab Results  Component Value Date   CHOL 116 11/20/2017   TRIG 108 11/20/2017   HDL 41 11/20/2017   CHOLHDL 2.8 11/20/2017   VLDL 22 11/20/2017   LDLCALC 53 11/20/2017   LDLCALC 66 09/30/2017    Physical Findings: AIMS: Facial and Oral Movements Muscles of Facial Expression: None, normal Lips and Perioral Area: None, normal Jaw: None, normal Tongue: None, normal,Extremity Movements Upper (arms, wrists, hands, fingers): None, normal Lower (legs, knees, ankles, toes): None, normal, Trunk Movements Neck, shoulders, hips: None, normal, Overall Severity Severity of abnormal movements (highest score from questions above): None, normal Incapacitation due to abnormal movements: None, normal Patient's awareness of abnormal movements (rate only patient's report): No Awareness, Dental Status Current problems with teeth and/or dentures?: No Does patient usually wear dentures?: No  CIWA:  COWS:     Musculoskeletal: Strength & Muscle Tone: within normal limits Gait & Station: normal Patient leans: N/A  Psychiatric Specialty Exam: Physical Exam  Nursing  note and vitals reviewed.   Review of Systems  Constitutional: Negative for chills and fever.  Respiratory: Negative for cough and shortness of breath.   Cardiovascular: Negative for chest pain.  Gastrointestinal: Negative for abdominal pain, heartburn, nausea and vomiting.  Psychiatric/Behavioral: Negative for depression, hallucinations and suicidal ideas. The patient is not nervous/anxious and does not have insomnia.     Blood pressure 112/81, pulse (!) 115, temperature 98.1 F (36.7 C), resp. rate 16, height 5\' 10"  (1.778 m), weight 108 kg, SpO2 98 %.Body mass index is 34.15 kg/m.  General Appearance: Casual and Disheveled  Eye Contact:  Good  Speech:  Clear and Coherent and Normal Rate  Volume:  Normal  Mood:  Euthymic  Affect:  Congruent and Flat  Thought Process:  Coherent and Goal Directed  Orientation:  Full (Time, Place, and Person)  Thought Content:  Logical  Suicidal Thoughts:  No  Homicidal Thoughts:  No  Memory:  Immediate;   Fair Recent;   Fair Remote;   Fair  Judgement:  Fair  Insight:  Lacking  Psychomotor Activity:  Normal  Concentration:  Concentration: Fair  Recall:  [name] of Knowledge:  Fair  Language:  Fair  Akathisia:  No  Handed:    AIMS (if indicated):     Assets:  Resilience Social Support  ADL's:  Intact  Cognition:  WNL  Sleep:  Number of Hours: 6.75   Treatment Plan Summary: Daily contact with patient to assess and evaluate symptoms and progress in treatment and Medication management   - Continue inpatient hospitalization.  -Will continue today 11/24/2017 plan as below except where it is noted.  -Schizophrenia             -Continue Risperdal 1mg  po qAM +3 mg po qhs  Insomnia -Continue with Trazodone 50mg  qhs prn insomnia  -Continue remeron 15mg  po qhs  -HLD             -Continue simvastatin 40mg  po qDay  -COPD              -Continue dulera 200-5 mcg/act take 2 puffs  BID  -HTN             -Continue lopressor 25mg  po BID  -seasonal allergy              -Continue claritin 10mg  po qDay  -DMII             -Continue current SSI orders with lantus and novolog             -Continue repalinide 2mg  po BID  -anxiety             -Continue vistaril 25mg  po TID prn anxiety  Will continue to monitor vitals ,medication compliance and treatment side effects while patient is here.   CSW willcontinueworking on disposition.  Patient to participate in therapeutic milieu  Pennelope Bracken, MD 11/24/2017, 5:09 PM

## 2017-11-24 NOTE — Progress Notes (Signed)
Patient verbalized that he had a good talk with his mother and is waiting for his uncle to call him. He feels that he might be able to go and live with his uncle following discharge. He feels that he may be discharged next Monday or Tuesday. His goal for tomorrow is to work on his coping skills and to address getting aggravated and not allowing people to aggravate him.

## 2017-11-25 LAB — GLUCOSE, CAPILLARY
GLUCOSE-CAPILLARY: 95 mg/dL (ref 70–99)
Glucose-Capillary: 110 mg/dL — ABNORMAL HIGH (ref 70–99)
Glucose-Capillary: 174 mg/dL — ABNORMAL HIGH (ref 70–99)
Glucose-Capillary: 223 mg/dL — ABNORMAL HIGH (ref 70–99)

## 2017-11-25 NOTE — Progress Notes (Signed)
Nursing Progress Note: 7p-7a D: Pt currently presents with a Pleasant/anxious affect and behavior. Pt states "I had a good day." Interacting appropriately with the milieu. Pt reports good sleep during the previous night with current medication regimen. Pt did attend wrap-up group.  A: Pt provided with medications per providers orders. Pt's labs and vitals were monitored throughout the night. Pt supported emotionally and encouraged to express concerns and questions. Pt educated on medications.  R: Pt's safety ensured with 15 minute and environmental checks. Pt currently denies SI, HI, and AVH. Pt verbally contracts to seek staff if SI,HI, or AVH occurs and to consult with staff before acting on any harmful thoughts. Will continue to monitor.

## 2017-11-25 NOTE — Progress Notes (Signed)
[facility] MD Progress Note  11/25/2017 11:22 AM [name] [name].  MRN:  [id number] Subjective:   HistoryPer assessment:-[name] Vaughnis an 52 y.o.malepresent to [facility] as a walk-in accompanied by his mother with complaints of bizarre behavior and auditory hallucinations. Per report from patient's mother she feels patient is having a psychotic break. Patient is a poor historian and speaks in a tangential language and appears to be delusional. Patient also displayed thought blocking. Patient denies suicidal / homicidal ideations and denies experiencing auditory / visual hallucinations. Patient report he taking his medication as prescribed but patient's mother feels patient is not taking his medication. Patient was last seen in the ED 08/09/2017 for bizarre behavior and last seen for an assessment 3.28.2019. Patient psychiatrist is Dr. Adele Schilder.  Today upon evaluation: Pt shares, "I'm pretty good." He denies any specific concerns today. He is sleeping well. His appetite is good. He denies other physical complaints. He denies SI/HI/AH/VH. He is tolerating his medications well and he is in agreement to continue his current regimen without changes. He feels that he would be safe to discharge, but his understanding is that he will not be able to return to stay at home with his mother. He has been in contact with his family and shares that he may possibly be able to stay with his uncle. Pt was encouraged to look into this option and share his findings with the treatment team. Spoke with pt's mother via telephone regarding his plan, and she describes feeling overwhelmed and unsure of the best course of action for pt's discharge plan. We discussed looking into options for pt to stay with family if there are not available options, we will look into alternatives after the weekend. Pt's mother was in agreement with that plan. Pt was in agreement with the above plan as well and he had no further questions,  comments, or concerns.  Principal Problem: Schizophrenia, paranoid type ([place]) Diagnosis:   Patient Active Problem List   Diagnosis Date Noted  . Schizophrenia, paranoid type ([name]) [F20.0] 11/18/2017  . DKA (diabetic ketoacidoses) ([place]) [E13.10] 11/05/2017  . Acute renal failure superimposed on stage 3 chronic kidney disease ([place]) [N17.9, N18.3] 11/05/2017  . Type II diabetes mellitus with renal manifestations ([place]) [E11.29] 11/05/2017  . Depression [F32.9] 11/05/2017  . Leukocytosis [D72.829] 11/05/2017  . Cough [R05] 11/05/2017  . Tobacco abuse [Z72.0] 11/05/2017  . Schizoaffective disorder, bipolar type ([place]) [F25.0] 06/10/2016  . DM (diabetes mellitus) type II uncontrolled, periph vascular disorder ([place]) [E11.51, E11.65] 02/29/2016  . Schizophrenia, disorganized type ([place]) [F20.1] 06/02/2015  . Hyperlipidemia [E78.5] 09/23/2014  . Renal insufficiency [N28.9] 09/23/2014  . HTN (hypertension) [I10] 03/22/2013  . Seasonal allergies [J30.2] 03/22/2013   Total Time spent with patient: 30 minutes  Past Psychiatric History: see H&P  Past Medical History:  Past Medical History:  Diagnosis Date  . Diabetes mellitus type II, uncontrolled ([place])   . HTN (hypertension)   . Hyperlipidemia LDL goal <70   . Schizophrenia ([place])    History reviewed. No pertinent surgical history. Family History:  Family History  Problem Relation Age of Onset  . Breast cancer Mother   . Diabetes Mother   . Prostate cancer Father        StepFather  . Hypertension Father   . Breast cancer Maternal Aunt   . Diabetes Maternal Uncle    Family Psychiatric  History: see H&P Social History:  Social History   Substance and Sexual Activity  Alcohol Use Yes  Comment: occasional     Social History   Substance and Sexual Activity  Drug Use Yes  . Types: Marijuana    Social History   Socioeconomic History  . Marital status: Single    Spouse name: Not on file  . Number of children: Not on file  .  Years of education: Not on file  . Highest education level: Not on file  Occupational History  . Not on file  Social Needs  . Financial resource strain: Not on file  . Food insecurity:    Worry: Not on file    Inability: Not on file  . Transportation needs:    Medical: Not on file    Non-medical: Not on file  Tobacco Use  . Smoking status: Current Every Day Smoker    Packs/day: 2.00    Years: 30.00    Pack years: 60.00    Types: Cigarettes, Cigars  . Smokeless tobacco: Never Used  Substance and Sexual Activity  . Alcohol use: Yes    Comment: occasional  . Drug use: Yes    Types: Marijuana  . Sexual activity: Never  Lifestyle  . Physical activity:    Days per week: Not on file    Minutes per session: Not on file  . Stress: Not on file  Relationships  . Social connections:    Talks on phone: Not on file    Gets together: Not on file    Attends religious service: Not on file    Active member of club or organization: Not on file    Attends meetings of clubs or organizations: Not on file    Relationship status: Not on file  Other Topics Concern  . Not on file  Social History Narrative   Lives with mom   unemployed   No children   Additional Social History:                         Sleep: Good  Appetite:  Good  Current Medications: Current Facility-Administered Medications  Medication Dose Route Frequency Provider Last Rate Last Dose  . acetaminophen (TYLENOL) tablet 650 mg  650 mg Oral Q6H PRN Ethelene Hal, NP   650 mg at 11/20/17 0134  . alum & mag hydroxide-simeth (MAALOX/MYLANTA) 200-200-20 MG/5ML suspension 30 mL  30 mL Oral Q4H PRN Ethelene Hal, NP      . hydrOXYzine (ATARAX/VISTARIL) tablet 25 mg  25 mg Oral TID PRN Ethelene Hal, NP   25 mg at 11/19/17 2132  . insulin aspart (novoLOG) injection 0-9 Units  0-9 Units Subcutaneous TID WC Ethelene Hal, NP   3 Units at 11/24/17 1657  . insulin glargine (LANTUS)  injection 60 Units  60 Units Subcutaneous Daily Ethelene Hal, NP   60 Units at 11/25/17 0818  . loratadine (CLARITIN) tablet 10 mg  10 mg Oral Daily Ethelene Hal, NP   10 mg at 11/25/17 0813  . magnesium hydroxide (MILK OF MAGNESIA) suspension 30 mL  30 mL Oral Daily PRN Ethelene Hal, NP      . metoprolol tartrate (LOPRESSOR) tablet 25 mg  25 mg Oral BID Ethelene Hal, NP   25 mg at 11/25/17 1610  . mirtazapine (REMERON) tablet 7.5 mg  7.5 mg Oral QHS Cobos, Myer Peer, MD   7.5 mg at 11/24/17 2124  . mometasone-formoterol (DULERA) 200-5 MCG/ACT inhaler 2 puff  2 puff Inhalation BID Ethelene Hal, NP  2 puff at 11/25/17 0812  . nicotine polacrilex (NICORETTE) gum 2 mg  2 mg Oral PRN Derrill Center, NP   2 mg at 11/25/17 0819  . repaglinide (PRANDIN) tablet 2 mg  2 mg Oral BID AC Ethelene Hal, NP   2 mg at 11/25/17 0813  . risperiDONE (RISPERDAL) tablet 3 mg  3 mg Oral QHS Derrill Center, NP   3 mg at 11/24/17 2125   And  . risperiDONE (RISPERDAL) tablet 1 mg  1 mg Oral Q breakfast Derrill Center, NP   1 mg at 11/25/17 0813  . simvastatin (ZOCOR) tablet 40 mg  40 mg Oral QHS Ethelene Hal, NP   40 mg at 11/24/17 2125  . traZODone (DESYREL) tablet 50 mg  50 mg Oral QHS PRN Ethelene Hal, NP   50 mg at 11/19/17 2132  . Vitamin D (Ergocalciferol) (DRISDOL) capsule 50,000 Units  50,000 Units Oral Weekly Ethelene Hal, NP   50,000 Units at 11/21/17 0759    Lab Results:  Results for orders placed or performed during the hospital encounter of 11/18/17 (from the past 48 hour(s))  Glucose, capillary     Status: Abnormal   Collection Time: 11/23/17 12:03 PM  Result Value Ref Range   Glucose-Capillary 127 (H) 70 - 99 mg/dL  Glucose, capillary     Status: Abnormal   Collection Time: 11/23/17  4:52 PM  Result Value Ref Range   Glucose-Capillary 250 (H) 70 - 99 mg/dL  Glucose, capillary     Status: Abnormal   Collection Time:  11/23/17  9:06 PM  Result Value Ref Range   Glucose-Capillary 217 (H) 70 - 99 mg/dL  Glucose, capillary     Status: Abnormal   Collection Time: 11/24/17  6:26 AM  Result Value Ref Range   Glucose-Capillary 146 (H) 70 - 99 mg/dL  Glucose, capillary     Status: Abnormal   Collection Time: 11/24/17 11:56 AM  Result Value Ref Range   Glucose-Capillary 111 (H) 70 - 99 mg/dL  Glucose, capillary     Status: Abnormal   Collection Time: 11/24/17  4:49 PM  Result Value Ref Range   Glucose-Capillary 252 (H) 70 - 99 mg/dL  Glucose, capillary     Status: Abnormal   Collection Time: 11/24/17  8:34 PM  Result Value Ref Range   Glucose-Capillary 185 (H) 70 - 99 mg/dL   Comment 1 Notify RN   Glucose, capillary     Status: Abnormal   Collection Time: 11/25/17  6:02 AM  Result Value Ref Range   Glucose-Capillary 110 (H) 70 - 99 mg/dL    Blood Alcohol level:  Lab Results  Component Value Date   ETH <10 11/17/2017   ETH <10 61/44/3154    Metabolic Disorder Labs: Lab Results  Component Value Date   HGBA1C 10.8 (H) 11/20/2017   MPG 263.26 11/20/2017   MPG 278 11/06/2017   Lab Results  Component Value Date   PROLACTIN 63.7 (H) 11/20/2017   Lab Results  Component Value Date   CHOL 116 11/20/2017   TRIG 108 11/20/2017   HDL 41 11/20/2017   CHOLHDL 2.8 11/20/2017   VLDL 22 11/20/2017   LDLCALC 53 11/20/2017   LDLCALC 66 09/30/2017    Physical Findings: AIMS: Facial and Oral Movements Muscles of Facial Expression: None, normal Lips and Perioral Area: None, normal Jaw: None, normal Tongue: None, normal,Extremity Movements Upper (arms, wrists, hands, fingers): None, normal Lower (legs, knees, ankles,  toes): None, normal, Trunk Movements Neck, shoulders, hips: None, normal, Overall Severity Severity of abnormal movements (highest score from questions above): None, normal Incapacitation due to abnormal movements: None, normal Patient's awareness of abnormal movements (rate only  patient's report): No Awareness, Dental Status Current problems with teeth and/or dentures?: No Does patient usually wear dentures?: No  CIWA:    COWS:     Musculoskeletal: Strength & Muscle Tone: within normal limits Gait & Station: normal Patient leans: N/A  Psychiatric Specialty Exam: Physical Exam  Nursing note and vitals reviewed.   Review of Systems  Constitutional: Negative for chills and fever.  Respiratory: Negative for cough and shortness of breath.   Cardiovascular: Negative for chest pain.  Gastrointestinal: Negative for abdominal pain, heartburn, nausea and vomiting.  Psychiatric/Behavioral: Negative for depression, hallucinations and suicidal ideas. The patient is not nervous/anxious and does not have insomnia.     Blood pressure 96/66, pulse (!) 118, temperature 98.2 F (36.8 C), temperature source Oral, resp. rate 16, height 5\' 10"  (1.778 m), weight 108 kg, SpO2 98 %.Body mass index is 34.15 kg/m.  General Appearance: Casual and Disheveled  Eye Contact:  Good  Speech:  Clear and Coherent and Normal Rate  Volume:  Normal  Mood:  Euthymic  Affect:  Appropriate, Congruent and Flat  Thought Process:  Coherent and Goal Directed  Orientation:  Full (Time, Place, and Person)  Thought Content:  Logical  Suicidal Thoughts:  No  Homicidal Thoughts:  No  Memory:  Immediate;   Fair Recent;   Fair Remote;   Fair  Judgement:  Poor  Insight:  Lacking  Psychomotor Activity:  Normal  Concentration:  Concentration: Fair  Recall:  [name] of Knowledge:  Fair  Language:  Fair  Akathisia:  No  Handed:    AIMS (if indicated):     Assets:  Resilience Social Support  ADL's:  Intact  Cognition:  WNL  Sleep:  Number of Hours: 5.75   Treatment Plan Summary: Daily contact with patient to assess and evaluate symptoms and progress in treatment and Medication management   - Continue inpatient hospitalization.  -Will continue today9/13/2019plan as below except where  it is noted.  -Schizophrenia -Continue Risperdal 1mg  po qAM +3 mg po qhs  Insomnia -Continue with Trazodone 50mg  qhs prn insomnia -Continue remeron 15mg  po qhs  -HLD -Continue simvastatin 40mg  po qDay  -COPD -Continue dulera 200-5 mcg/act take 2 puffs BID  -HTN -Continue lopressor 25mg  po BID  -seasonal allergy -Continue claritin 10mg  po qDay  -DMII -Continue current SSI orders with lantus and novolog -Continue repalinide 2mg  po BID  -anxiety -Continue vistaril 25mg  po TID prn anxiety  Will continue to monitor vitals ,medication compliance and treatment side effects while patient is here.   CSW willcontinueworking on disposition.  Patient to participate in therapeutic milieu  Pennelope Bracken, MD 11/25/2017, 11:22 AM

## 2017-11-25 NOTE — Tx Team (Signed)
Interdisciplinary Treatment and Diagnostic Plan Update  11/25/2017 Time of Session: 10:45am Marcus Schwandt. MRN: [id number]  Principal Diagnosis: Schizophrenia, paranoid type ([place])  Secondary Diagnoses: Principal Problem:   Schizophrenia, paranoid type ([place])   Current Medications:  Current Facility-Administered Medications  Medication Dose Route Frequency Provider Last Rate Last Dose  . acetaminophen (TYLENOL) tablet 650 mg  650 mg Oral Q6H PRN Ethelene Hal, NP   650 mg at 11/20/17 0134  . alum & mag hydroxide-simeth (MAALOX/MYLANTA) 200-200-20 MG/5ML suspension 30 mL  30 mL Oral Q4H PRN Ethelene Hal, NP      . hydrOXYzine (ATARAX/VISTARIL) tablet 25 mg  25 mg Oral TID PRN Ethelene Hal, NP   25 mg at 11/19/17 2132  . insulin aspart (novoLOG) injection 0-9 Units  0-9 Units Subcutaneous TID WC Ethelene Hal, NP   3 Units at 11/24/17 1657  . insulin glargine (LANTUS) injection 60 Units  60 Units Subcutaneous Daily Ethelene Hal, NP   60 Units at 11/25/17 0818  . loratadine (CLARITIN) tablet 10 mg  10 mg Oral Daily Ethelene Hal, NP   10 mg at 11/25/17 0813  . magnesium hydroxide (MILK OF MAGNESIA) suspension 30 mL  30 mL Oral Daily PRN Ethelene Hal, NP      . metoprolol tartrate (LOPRESSOR) tablet 25 mg  25 mg Oral BID Ethelene Hal, NP   25 mg at 11/25/17 3536  . mirtazapine (REMERON) tablet 7.5 mg  7.5 mg Oral QHS Cobos, Myer Peer, MD   7.5 mg at 11/24/17 2124  . mometasone-formoterol (DULERA) 200-5 MCG/ACT inhaler 2 puff  2 puff Inhalation BID Ethelene Hal, NP   2 puff at 11/25/17 1443  . nicotine polacrilex (NICORETTE) gum 2 mg  2 mg Oral PRN Derrill Center, NP   2 mg at 11/25/17 0819  . repaglinide (PRANDIN) tablet 2 mg  2 mg Oral BID AC Ethelene Hal, NP   2 mg at 11/25/17 0813  . risperiDONE (RISPERDAL) tablet 3 mg  3 mg Oral QHS Derrill Center, NP   3 mg at 11/24/17 2125   And  .  risperiDONE (RISPERDAL) tablet 1 mg  1 mg Oral Q breakfast Derrill Center, NP   1 mg at 11/25/17 0813  . simvastatin (ZOCOR) tablet 40 mg  40 mg Oral QHS Ethelene Hal, NP   40 mg at 11/24/17 2125  . traZODone (DESYREL) tablet 50 mg  50 mg Oral QHS PRN Ethelene Hal, NP   50 mg at 11/19/17 2132  . Vitamin D (Ergocalciferol) (DRISDOL) capsule 50,000 Units  50,000 Units Oral Weekly Ethelene Hal, NP   50,000 Units at 11/21/17 0759   PTA Medications: Medications Prior to Admission  Medication Sig Dispense Refill Last Dose  . ALLERGY RELIEF 10 MG tablet TAKE 1 TABLET BY MOUTH EVERY DAY (Patient taking differently: Take 10 mg by mouth daily as needed for allergies. ) 30 tablet 0 Past Month at Unknown time  . aspirin EC 81 MG tablet Take 1 tablet (81 mg total) by mouth daily.   11/17/2017 at Unknown time  . blood glucose meter kit and supplies KIT Dispense based on patient and insurance preference. Use up to four times daily as directed. (FOR ICD-9 250.00, 250.01). 1 each 0 Taking  . Fluticasone-Salmeterol (ADVAIR DISKUS) 250-50 MCG/DOSE AEPB Inhale 1 puff into the lungs 2 (two) times daily. (Patient taking differently: Inhale 1 puff into the lungs 2 (two)  times daily as needed (wheezing). ) 1 each 3 Past Month at Unknown time  . insulin glargine (LANTUS) 100 UNIT/ML injection Inject 0.6 mLs (60 Units total) into the skin daily. 10 mL 11 11/17/2017 at Unknown time  . Insulin Pen Needle 32G X 6 MM MISC 1 Units by Does not apply route every morning. 90 each 1 Taking  . metoprolol tartrate (LOPRESSOR) 25 MG tablet TAKE 1 TABLET BY MOUTH TWICE A DAY (Patient taking differently: Take 25 mg by mouth 2 (two) times daily. ) 180 tablet 1 11/17/2017 at 1100  . mirtazapine (REMERON) 30 MG tablet Take 1 tablet (30 mg total) by mouth at bedtime. 30 tablet 2 11/16/2017 at Unknown time  . Multiple Vitamin (MULTIVITAMIN WITH MINERALS) TABS tablet Take 1 tablet by mouth daily.   11/17/2017 at Unknown time   . repaglinide (PRANDIN) 2 MG tablet Take 1 tablet (2 mg total) by mouth 3 (three) times daily before meals. (Patient taking differently: Take 2 mg by mouth 2 (two) times daily before a meal. ) 90 tablet 1 11/17/2017 at Unknown time  . risperiDONE (RISPERDAL) 2 MG tablet TAKE 1 TABLET IN THE MORNING AND TAKE 2 TABLETS AT BEDTIME (Patient taking differently: Take 2-4 mg by mouth See admin instructions. TAKE 1 TABLET IN THE MORNING AND TAKE 2 TABLETS AT BEDTIME) 90 tablet 2 11/17/2017 at Unknown time  . simvastatin (ZOCOR) 40 MG tablet TAKE 1 TABLET BY MOUTH EVERY DAY (Patient taking differently: Take 40 mg by mouth daily. ) 90 tablet 1 11/16/2017 at Unknown time  . traZODone (DESYREL) 100 MG tablet Take 1 tablet (100 mg total) by mouth at bedtime. 30 tablet 2 11/16/2017 at Unknown time  . Vitamin D, Ergocalciferol, (DRISDOL) 50000 units CAPS capsule Take 1 capsule by mouth once a week. On Monday  2 11/14/2017    Patient Stressors: Financial difficulties Health problems Medication change or noncompliance  Patient Strengths: Motivation for treatment/growth Supportive family/friends  Treatment Modalities: Medication Management, Group therapy, Case management,  1 to 1 session with clinician, Psychoeducation, Recreational therapy.   Physician Treatment Plan for Primary Diagnosis: Schizophrenia, paranoid type ([place]) Long Term Goal(s): Improvement in symptoms so as ready for discharge Improvement in symptoms so as ready for discharge   Short Term Goals: Ability to identify changes in lifestyle to reduce recurrence of condition will improve Ability to disclose and discuss suicidal ideas Ability to demonstrate self-control will improve Ability to identify and develop effective coping behaviors will improve Ability to identify changes in lifestyle to reduce recurrence of condition will improve Ability to verbalize feelings will improve Ability to disclose and discuss suicidal ideas Ability to demonstrate  self-control will improve  Medication Management: Evaluate patient's response, side effects, and tolerance of medication regimen.  Therapeutic Interventions: 1 to 1 sessions, Unit Group sessions and Medication administration.  Evaluation of Outcomes: Adequate for Discharge  Physician Treatment Plan for Secondary Diagnosis: Principal Problem:   Schizophrenia, paranoid type ([place])  Long Term Goal(s): Improvement in symptoms so as ready for discharge Improvement in symptoms so as ready for discharge   Short Term Goals: Ability to identify changes in lifestyle to reduce recurrence of condition will improve Ability to disclose and discuss suicidal ideas Ability to demonstrate self-control will improve Ability to identify and develop effective coping behaviors will improve Ability to identify changes in lifestyle to reduce recurrence of condition will improve Ability to verbalize feelings will improve Ability to disclose and discuss suicidal ideas Ability to demonstrate self-control will improve  Medication Management: Evaluate patient's response, side effects, and tolerance of medication regimen.  Therapeutic Interventions: 1 to 1 sessions, Unit Group sessions and Medication administration.  Evaluation of Outcomes: Adequate for Discharge   RN Treatment Plan for Primary Diagnosis: Schizophrenia, paranoid type ([place]) Long Term Goal(s): Knowledge of disease and therapeutic regimen to maintain health will improve  Short Term Goals: Ability to demonstrate self-control, Ability to participate in decision making will improve, Ability to verbalize feelings will improve, Ability to identify and develop effective coping behaviors will improve and Compliance with prescribed medications will improve  Medication Management: RN will administer medications as ordered by provider, will assess and evaluate patient's response and provide education to patient for prescribed medication. RN will report any  adverse and/or side effects to prescribing provider.  Therapeutic Interventions: 1 on 1 counseling sessions, Psychoeducation, Medication administration, Evaluate responses to treatment, Monitor vital signs and CBGs as ordered, Perform/monitor CIWA, COWS, AIMS and Fall Risk screenings as ordered, Perform wound care treatments as ordered.  Evaluation of Outcomes: Adequate for Discharge   LCSW Treatment Plan for Primary Diagnosis: Schizophrenia, paranoid type ([place]) Long Term Goal(s): Safe transition to appropriate next level of care at discharge, Engage patient in therapeutic group addressing interpersonal concerns.  Short Term Goals: Engage patient in aftercare planning with referrals and resources and Increase skills for wellness and recovery  Therapeutic Interventions: Assess for all discharge needs, 1 to 1 time with Social worker, Explore available resources and support systems, Assess for adequacy in community support network, Educate family and significant other(s) on suicide prevention, Complete Psychosocial Assessment, Interpersonal group therapy.  Evaluation of Outcomes: Not Met  Kendra Opitz coming to see pt this weekend about possibility of staying at her transitional living facility   Progress in Treatment: Attending groups: Yes. Participating in groups: Yes. Taking medication as prescribed: Yes. Toleration medication: Yes. Family/Significant other contact made: Yes, individual(s) contacted:  patient's mother Patient understands diagnosis: Yes. Limited insight Discussing patient identified problems/goals with staff: Yes. Medical problems stabilized or resolved: Yes. Denies suicidal/homicidal ideation: Yes. Issues/concerns per patient self-inventory: No. Other:   New problem(s) identified: None  New Short Term/Long Term Goal(s):medication stabilization, elimination of SI thoughts, development of comprehensive mental wellness plan.   Patient Goals:  Coping skills   Discharge  Plan or Barriers: CSW will assess for appropriate referrals and discharge planning.   Reason for Continuation of Hospitalization: Hallucinations Medication stabilization Other; describe [place] behaviors  Estimated Length of Stay: 11/25/17  Attendees: Patient:  11/25/2017 2:24 PM  Physician: Dr. Maris Berger, MD 11/25/2017 2:24 PM  Nursing: Benjamine Mola.A, RN 11/25/2017 2:24 PM  RN Care Manager: Rhunette Croft 11/25/2017 2:24 PM  Social Worker: Radonna Ricker, Jaci Carrel [place], [place] 11/25/2017 2:24 PM  Recreational Therapist: X 11/25/2017 2:24 PM  Other: X 11/25/2017 2:24 PM  Other: X 11/25/2017 2:24 PM  Other:X 11/25/2017 2:24 PM    Scribe for Treatment Team: Trish Mage, LCSW 11/25/2017 2:24 PM

## 2017-11-25 NOTE — [facility] Group Notes (Signed)
[place] LCSW Group Therapy  11/25/2017  1:05 PM  Type of Therapy:  Group therapy  Participation Level:  Did not attend  Participation Quality:  Attentive  Affect:  Flat  Cognitive:  Oriented  Insight:  Limited  Engagement in Therapy:  Limited  Modes of Intervention:  Discussion, Socialization  Summary of Progress/Problems:  Chaplain was here to lead a group on themes of hope and courage.  Trish Mage 11/25/2017 2:34 PM

## 2017-11-25 NOTE — Progress Notes (Signed)
Adult Psychoeducational Group Note  Date:  11/25/2017 Time:  8:51 PM  Group Topic/Focus:  Wrap-Up Group:   The focus of this group is to help patients review their daily goal of treatment and discuss progress on daily workbooks.  Participation Level:  Active  Participation Quality:  Appropriate  Affect:  Appropriate  Cognitive:  Appropriate  Insight: Appropriate  Engagement in Group:  Engaged  Modes of Intervention:  Discussion  Additional Comments:  Patient attended group and said that that his day was a 7.  Patient said his goal is to learn how to  handle his angel issues.   Iliyana Convey W Hina Gupta 3/41/4436, 8:51 PM

## 2017-11-26 LAB — GLUCOSE, CAPILLARY
GLUCOSE-CAPILLARY: 212 mg/dL — AB (ref 70–99)
Glucose-Capillary: 104 mg/dL — ABNORMAL HIGH (ref 70–99)
Glucose-Capillary: 100 mg/dL — ABNORMAL HIGH (ref 70–99)
Glucose-Capillary: 176 mg/dL — ABNORMAL HIGH (ref 70–99)

## 2017-11-26 NOTE — [facility] Group Notes (Signed)
LCSW Group Therapy Note  11/26/2017    11:00am - 12:00pm  Type of Therapy and Topic:  Group Therapy: Anger and Coping Skills  Participation Level:  Active   Description of Group:   In this group, patients learned how to recognize the physical, cognitive, emotional, and behavioral responses they have to anger-provoking situations.  They identified how they usually or often react when angered, and learned how healthy and unhealthy coping skills work initially, but the unhealthy ones stop working.   They analyzed how their frequently-chosen coping skill is possibly beneficial and how it is possibly unhelpful.  The group discussed a variety of healthier coping skills that could help in resolving the actual issues, as well as how to go about planning for the the possibility of future similar situations.  Therapeutic Goals: 1. Patients will identify one thing that makes them angry and how they feel emotionally and physically, what their thoughts are or tend to be in those situations, and what healthy or unhealthy coping mechanism they typically use 2. Patients will identify how their coping technique works for them, as well as how it works against them. 3. Patients will explore possible new behaviors to use in future anger situations. 4. Patients will learn that anger itself is normal and cannot be eliminated, and that healthier coping skills can assist with resolving conflict rather than worsening situations.  Summary of Patient Progress:  The patient shared that he was "shouting angry" 2 weeks ago at a local store.  He also said this was because of a Dance movement psychotherapist being [place], but his reasoning could not be followed.  Therapeutic Modalities:   Cognitive Behavioral Therapy Motivation Interviewing  Maretta Los

## 2017-11-26 NOTE — [facility] Group Notes (Signed)
[facility] Group Notes:  (Nursing/MHT/Case Management/Adjunct)  Date:  11/26/2017  Time:  3:42 PM    Type of Therapy:  Psychoeducational Skills  Participation Level:  Active  Participation Quality:  Appropriate  Affect:  Appropriate  Cognitive:  Appropriate  Insight:  Appropriate  Engagement in Group:  Engaged  Modes of Intervention:  Problem-solving  Summary of Progress/Problems:  Topic was on anger management. [name] [name] 11/26/2017, 3:42 PM

## 2017-11-26 NOTE — Progress Notes (Signed)
DAR NOTE: Patient presents with calm affect and bright mood. Pt has been interacting with peers well. Reports good sleep, good appetite, normal energy, and good concentration. Denies pain, auditory and visual hallucinations.  Rates depression at 0, hopelessness at 0, and anxiety at 0.  Maintained on routine safety checks.  Medications given as prescribed.  Support and encouragement offered as needed.  Attended group and participated.  States goal for today is "getting well."  Patient observed socializing with peers in the dayroom.  Offered no complaint.

## 2017-11-26 NOTE — Progress Notes (Signed)
Adult Psychoeducational Group Note  Date:  11/26/2017 Time:  9:00 PM  Group Topic/Focus:  Wrap-Up Group:   The focus of this group is to help patients review their daily goal of treatment and discuss progress on daily workbooks.  Participation Level:  Active  Participation Quality:  Appropriate  Affect:  Appropriate  Cognitive:  Appropriate  Insight: Appropriate  Engagement in Group:  Engaged  Modes of Intervention:  Discussion  Additional Comments: The patient expressed that he rates today a 10.the patient also said that he attend groups.  Nash Shearer 11/26/2017, 9:00 PM

## 2017-11-26 NOTE — Progress Notes (Signed)
Nursing Progress Note: 7p-7a D: Pt currently presents with a anxious/pleasant affect and behavior. Interacting appropriately with the milieu. Pt reports good sleep during the previous night with current medication regimen. Pt did attend wrap-up group.  A: Pt provided with medications per providers orders. Pt's labs and vitals were monitored throughout the night. Pt supported emotionally and encouraged to express concerns and questions. Pt educated on medications.  R: Pt's safety ensured with 15 minute and environmental checks. Pt currently denies SI, HI, and AVH. Pt verbally contracts to seek staff if SI,HI, or AVH occurs and to consult with staff before acting on any harmful thoughts. Will continue to monitor.  

## 2017-11-26 NOTE — Progress Notes (Signed)
[facility] MD Progress Note  11/26/2017 2:59 PM  [name] [name].  MRN:  [id number]  Subjective: [name] [name] reports, "I'm doing pretty good. I have no complaints at all. I'm sleeping way too good. I bet that is the reason that I feel tired. I have no depression".  HistoryPer assessment:-Lyan Maisano Jr.is an 52 y.[facility] accompanied by his mother with complaints of bizarre behavior and auditory hallucinations. Per report from patient's mother she feels patient is having a psychotic break. Patient is a poor historian and speaks in a tangential language and appears to be delusional. Patient also displayed thought blocking. Patient denies suicidal / homicidal ideations and denies experiencing auditory / visual hallucinations. Patient report he taking his medication as prescribed but patient's mother feels patient is not taking his medication. Patient was last seen in the ED 08/09/2017 for bizarre behavior and last seen for an assessment 3.28.2019. Patient psychiatrist is Dr. Adele Schilder.  Today upon evaluation: Pt shares, "I'm pretty good." He denies any specific concerns today. He is sleeping well. His appetite is good. He denies other physical complaints. He denies SI/HI/AH/VH. He is tolerating his medications well and he is in agreement to continue his current regimen without changes. He feels that he would be safe to discharge, but his understanding is that he will not be able to return to stay at home with his mother. He has been in contact with his family and shares that he may possibly be able to stay with his uncle. Pt was encouraged to look into this option and share his findings with the treatment team. Spoke with pt's mother via telephone regarding his plan, and she describes feeling overwhelmed and unsure of the best course of action for pt's discharge plan. We discussed looking into options for pt to stay with family if there are not available options, we will look into  alternatives after the weekend. Pt's mother was in agreement with that plan. Pt was in agreement with the above plan as well and he had no further questions, comments, or concerns.  Principal Problem: Schizophrenia, paranoid type ([place]) Diagnosis:   Patient Active Problem List   Diagnosis Date Noted  . Schizophrenia, paranoid type ([place]) [F20.0] 11/18/2017  . DKA (diabetic ketoacidoses) ([place]) [E13.10] 11/05/2017  . Acute renal failure superimposed on stage 3 chronic kidney disease ([place]) [N17.9, N18.3] 11/05/2017  . Type II diabetes mellitus with renal manifestations ([place]) [E11.29] 11/05/2017  . Depression [F32.9] 11/05/2017  . Leukocytosis [D72.829] 11/05/2017  . Cough [R05] 11/05/2017  . Tobacco abuse [Z72.0] 11/05/2017  . Schizoaffective disorder, bipolar type ([place]) [F25.0] 06/10/2016  . DM (diabetes mellitus) type II uncontrolled, periph vascular disorder ([place]) [E11.51, E11.65] 02/29/2016  . Schizophrenia, disorganized type ([place]) [F20.1] 06/02/2015  . Hyperlipidemia [E78.5] 09/23/2014  . Renal insufficiency [N28.9] 09/23/2014  . HTN (hypertension) [I10] 03/22/2013  . Seasonal allergies [J30.2] 03/22/2013   Total Time spent with patient: 15 minutes  Past Psychiatric History: See H&P  Past Medical History:  Past Medical History:  Diagnosis Date  . Diabetes mellitus type II, uncontrolled ([place])   . HTN (hypertension)   . Hyperlipidemia LDL goal <70   . Schizophrenia ([place])    History reviewed. No pertinent surgical history. Family History:  Family History  Problem Relation Age of Onset  . Breast cancer Mother   . Diabetes Mother   . Prostate cancer Father        StepFather  . Hypertension Father   . Breast cancer Maternal Aunt   .  Diabetes Maternal Uncle    Family Psychiatric  History: See H&P  Social History:  Social History   Substance and Sexual Activity  Alcohol Use Yes   Comment: occasional     Social History   Substance and Sexual Activity  Drug Use Yes  .  Types: Marijuana    Social History   Socioeconomic History  . Marital status: Single    Spouse name: Not on file  . Number of children: Not on file  . Years of education: Not on file  . Highest education level: Not on file  Occupational History  . Not on file  Social Needs  . Financial resource strain: Not on file  . Food insecurity:    Worry: Not on file    Inability: Not on file  . Transportation needs:    Medical: Not on file    Non-medical: Not on file  Tobacco Use  . Smoking status: Current Every Day Smoker    Packs/day: 2.00    Years: 30.00    Pack years: 60.00    Types: Cigarettes, Cigars  . Smokeless tobacco: Never Used  Substance and Sexual Activity  . Alcohol use: Yes    Comment: occasional  . Drug use: Yes    Types: Marijuana  . Sexual activity: Never  Lifestyle  . Physical activity:    Days per week: Not on file    Minutes per session: Not on file  . Stress: Not on file  Relationships  . Social connections:    Talks on phone: Not on file    Gets together: Not on file    Attends religious service: Not on file    Active member of club or organization: Not on file    Attends meetings of clubs or organizations: Not on file    Relationship status: Not on file  Other Topics Concern  . Not on file  Social History Narrative   Lives with mom   unemployed   No children   Additional Social History:   Sleep: Good  Appetite:  Good  Current Medications: Current Facility-Administered Medications  Medication Dose Route Frequency Provider Last Rate Last Dose  . acetaminophen (TYLENOL) tablet 650 mg  650 mg Oral Q6H PRN Ethelene Hal, NP   650 mg at 11/20/17 0134  . alum & mag hydroxide-simeth (MAALOX/MYLANTA) 200-200-20 MG/5ML suspension 30 mL  30 mL Oral Q4H PRN Ethelene Hal, NP      . hydrOXYzine (ATARAX/VISTARIL) tablet 25 mg  25 mg Oral TID PRN Ethelene Hal, NP   25 mg at 11/19/17 2132  . insulin aspart (novoLOG) injection 0-9  Units  0-9 Units Subcutaneous TID WC Ethelene Hal, NP   2 Units at 11/25/17 1711  . insulin glargine (LANTUS) injection 60 Units  60 Units Subcutaneous Daily Ethelene Hal, NP   60 Units at 11/26/17 0825  . loratadine (CLARITIN) tablet 10 mg  10 mg Oral Daily Ethelene Hal, NP   10 mg at 11/26/17 4782  . magnesium hydroxide (MILK OF MAGNESIA) suspension 30 mL  30 mL Oral Daily PRN Ethelene Hal, NP      . metoprolol tartrate (LOPRESSOR) tablet 25 mg  25 mg Oral BID Ethelene Hal, NP   25 mg at 11/26/17 0820  . mirtazapine (REMERON) tablet 7.5 mg  7.5 mg Oral QHS Cobos, Myer Peer, MD   7.5 mg at 11/25/17 2130  . mometasone-formoterol (DULERA) 200-5 MCG/ACT inhaler 2 puff  2  puff Inhalation BID Ethelene Hal, NP   2 puff at 11/26/17 [phone]  . nicotine polacrilex (NICORETTE) gum 2 mg  2 mg Oral PRN Derrill Center, NP   2 mg at 11/26/17 0827  . repaglinide (PRANDIN) tablet 2 mg  2 mg Oral BID AC Ethelene Hal, NP   2 mg at 11/26/17 0855  . risperiDONE (RISPERDAL) tablet 3 mg  3 mg Oral QHS Derrill Center, NP   3 mg at 11/25/17 2130   And  . risperiDONE (RISPERDAL) tablet 1 mg  1 mg Oral Q breakfast Derrill Center, NP   1 mg at 11/26/17 0820  . simvastatin (ZOCOR) tablet 40 mg  40 mg Oral QHS Ethelene Hal, NP   40 mg at 11/25/17 2130  . traZODone (DESYREL) tablet 50 mg  50 mg Oral QHS PRN Ethelene Hal, NP   50 mg at 11/19/17 2132  . Vitamin D (Ergocalciferol) (DRISDOL) capsule 50,000 Units  50,000 Units Oral Weekly Ethelene Hal, NP   50,000 Units at 11/21/17 0759   Lab Results:  Results for orders placed or performed during the hospital encounter of 11/18/17 (from the past 48 hour(s))  Glucose, capillary     Status: Abnormal   Collection Time: 11/24/17  4:49 PM  Result Value Ref Range   Glucose-Capillary 252 (H) 70 - 99 mg/dL  Glucose, capillary     Status: Abnormal   Collection Time: 11/24/17  8:34 PM  Result  Value Ref Range   Glucose-Capillary 185 (H) 70 - 99 mg/dL   Comment 1 Notify RN   Glucose, capillary     Status: Abnormal   Collection Time: 11/25/17  6:02 AM  Result Value Ref Range   Glucose-Capillary 110 (H) 70 - 99 mg/dL  Glucose, capillary     Status: None   Collection Time: 11/25/17 12:01 PM  Result Value Ref Range   Glucose-Capillary 95 70 - 99 mg/dL   Comment 1 Notify RN    Comment 2 Document in Chart   Glucose, capillary     Status: Abnormal   Collection Time: 11/25/17  4:55 PM  Result Value Ref Range   Glucose-Capillary 174 (H) 70 - 99 mg/dL   Comment 1 Notify RN    Comment 2 Document in Chart   Glucose, capillary     Status: Abnormal   Collection Time: 11/25/17  8:24 PM  Result Value Ref Range   Glucose-Capillary 223 (H) 70 - 99 mg/dL   Comment 1 Notify RN   Glucose, capillary     Status: Abnormal   Collection Time: 11/26/17  6:01 AM  Result Value Ref Range   Glucose-Capillary 104 (H) 70 - 99 mg/dL  Glucose, capillary     Status: Abnormal   Collection Time: 11/26/17 12:09 PM  Result Value Ref Range   Glucose-Capillary 100 (H) 70 - 99 mg/dL   Blood Alcohol level:  Lab Results  Component Value Date   ETH <10 11/17/2017   ETH <10 09/98/3382   Metabolic Disorder Labs: Lab Results  Component Value Date   HGBA1C 10.8 (H) 11/20/2017   MPG 263.26 11/20/2017   MPG 278 11/06/2017   Lab Results  Component Value Date   PROLACTIN 63.7 (H) 11/20/2017   Lab Results  Component Value Date   CHOL 116 11/20/2017   TRIG 108 11/20/2017   HDL 41 11/20/2017   CHOLHDL 2.8 11/20/2017   VLDL 22 11/20/2017   LDLCALC 53 11/20/2017   LDLCALC  66 09/30/2017   Physical Findings: AIMS: Facial and Oral Movements Muscles of Facial Expression: None, normal Lips and Perioral Area: None, normal Jaw: None, normal Tongue: None, normal,Extremity Movements Upper (arms, wrists, hands, fingers): None, normal Lower (legs, knees, ankles, toes): None, normal, Trunk Movements Neck,  shoulders, hips: None, normal, Overall Severity Severity of abnormal movements (highest score from questions above): None, normal Incapacitation due to abnormal movements: None, normal Patient's awareness of abnormal movements (rate only patient's report): No Awareness, Dental Status Current problems with teeth and/or dentures?: No Does patient usually wear dentures?: No  CIWA:    COWS:     Musculoskeletal: Strength & Muscle Tone: within normal limits Gait & Station: normal Patient leans: N/A  Psychiatric Specialty Exam: Physical Exam  Nursing note and vitals reviewed.   Review of Systems  Constitutional: Negative for chills and fever.  Respiratory: Negative for cough and shortness of breath.   Cardiovascular: Negative for chest pain.  Gastrointestinal: Negative for abdominal pain, heartburn, nausea and vomiting.  Psychiatric/Behavioral: Negative for depression, hallucinations and suicidal ideas. The patient is not nervous/anxious and does not have insomnia.     Blood pressure 97/81, pulse (!) 126, temperature 98.2 F (36.8 C), temperature source Oral, resp. rate 16, height 5\' 10"  (1.778 m), weight 108 kg, SpO2 98 %.Body mass index is 34.15 kg/m.  General Appearance: Casual and Disheveled  Eye Contact:  Good  Speech:  Clear and Coherent and Normal Rate  Volume:  Normal  Mood:  Euthymic  Affect:  Appropriate, Congruent and Flat  Thought Process:  Coherent and Goal Directed  Orientation:  Full (Time, Place, and Person)  Thought Content:  Logical  Suicidal Thoughts:  No  Homicidal Thoughts:  No  Memory:  Immediate;   Fair Recent;   Fair Remote;   Fair  Judgement:  Poor  Insight:  Lacking  Psychomotor Activity:  Normal  Concentration:  Concentration: Fair  Recall:  [name] of Knowledge:  Fair  Language:  Fair  Akathisia:  No  Handed:    AIMS (if indicated):     Assets:  Resilience Social Support  ADL's:  Intact  Cognition:  WNL  Sleep:  Number of Hours: 5.75    Treatment Plan Summary: Daily contact with patient to assess and evaluate symptoms and progress in treatment and Medication management   - Continue inpatient hospitalization.  Will continue today 11/26/2017 plan as below except where it is noted.  -Schizophrenia -Continue Risperdal 1mg  po qAM +3 mg po qhs  Insomnia -Continue with Trazodone 50mg  qhs prn insomnia -Continue remeron 15mg  po qhs  -HLD -Continue simvastatin 40mg  po qDay  -COPD -Continue dulera 200-5 mcg/act take 2 puffs BID  -HTN -Continue lopressor 25mg  po BID  -seasonal allergy -Continue claritin 10mg  po qDay  -DMII -Continue current SSI orders with lantus and novolog -Continue repalinide 2mg  po BID  -anxiety -Continue vistaril 25mg  po TID prn anxiety  Will continue to monitor vitals ,medication compliance and treatment side effects while patient is here.   CSW willcontinueworking on disposition.  Patient to participate in therapeutic milieu  Lindell Spar, NP, PMHNP, FNP-BC. 11/26/2017, 2:59 PMPatient ID: [name] Hollan [name] [name]., male   DOB: May 15, 1965, 52 y.o.   MRN: [id number]

## 2017-11-27 LAB — GLUCOSE, CAPILLARY
GLUCOSE-CAPILLARY: 119 mg/dL — AB (ref 70–99)
Glucose-Capillary: 103 mg/dL — ABNORMAL HIGH (ref 70–99)
Glucose-Capillary: 142 mg/dL — ABNORMAL HIGH (ref 70–99)
Glucose-Capillary: 219 mg/dL — ABNORMAL HIGH (ref 70–99)

## 2017-11-27 NOTE — Progress Notes (Signed)
A lady from [employer] to the pt, but refused to her.

## 2017-11-27 NOTE — [facility] Group Notes (Signed)
[place] Group Notes:  (Nursing/MHT/Case Management/Adjunct)  Date:  11/27/2017  Time:  9:20 AM  Type of Therapy:  Goals/Orientation Group.  Participation Level:  Active  Participation Quality:  Appropriate  Affect:  Appropriate  Cognitive:  Appropriate  Insight:  Appropriate  Engagement in Group:  Engaged  Modes of Intervention:  Discussion  Summary of Progress/Problems: Pt attended goals/orientation group, pt was receptive.  [name] [name] 11/27/2017, 9:20 AM

## 2017-11-27 NOTE — Progress Notes (Signed)
Adult Psychoeducational Group Note  Date:  11/27/2017 Time:  9:05 PM  Group Topic/Focus:  Wrap-Up Group:   The focus of this group is to help patients review their daily goal of treatment and discuss progress on daily workbooks.  Participation Level:  Active  Participation Quality:  Appropriate  Affect:  Appropriate  Cognitive:  Appropriate  Insight: Appropriate  Engagement in Group:  Engaged  Modes of Intervention:  Discussion  Additional Comments:  The patient expressed that he rates today a 8.The patient also said that he attended group.  Nash Shearer 11/27/2017, 9:05 PM

## 2017-11-27 NOTE — [facility] Group Notes (Signed)
[facility] LCSW Group Therapy Note  Date/Time:  11/27/2017  11:00AM-12:00PM  Type of Therapy and Topic:  Group Therapy:  Music and Mood  Participation Level:  Active   Description of Group: In this process group, members listened to a variety of genres of music and identified that different types of music evoke different responses.  Patients were encouraged to identify music that was soothing for them and music that was energizing for them.  Patients discussed how this knowledge can help with wellness and recovery in various ways including managing depression and anxiety as well as encouraging healthy sleep habits.    Therapeutic Goals: 1. Patients will explore the impact of different varieties of music on mood 2. Patients will verbalize the thoughts they have when listening to different types of music 3. Patients will identify music that is soothing to them as well as music that is energizing to them 4. Patients will discuss how to use this knowledge to assist in maintaining wellness and recovery 5. Patients will explore the use of music as a coping skill  Summary of Patient Progress:  At the beginning of group, patient expressed he felt good and at the end of group felt even more relaxed.  Therapeutic Modalities: Solution Focused Brief Therapy Activity   Selmer Dominion, LCSW

## 2017-11-27 NOTE — Progress Notes (Signed)
DAR NOTE: Pt present with flat affect and depressed mood in the unit. Pt has been visible in the milieu interacting with peers. Pt denies physical pain, took all his meds as scheduled. As per self inventory, pt had a good night sleep, good appetite, normal energy, and good concentration. Pt rate depression at 0, hopeless ness at 0. Pt's safety ensured with 15 minute and environmental checks. Pt currently denies SI/HI and A/V hallucinations. Pt verbally agrees to seek staff if SI/HI or A/VH occurs and to consult with staff before acting on these thoughts. Will continue POC.

## 2017-11-27 NOTE — Progress Notes (Signed)
[facility] MD Progress Note  11/27/2017 5:05 PM  [name].  MRN:  [id number]  Subjective: Kairyn reports, "I'm doing good; just tired today"  HistoryPer assessment:-Glennis Sgro Jr.is an 52 y.o.malepresent to [facility] as a walk-in accompanied by his mother with complaints of bizarre behavior and auditory hallucinations. Per report from patient's mother she feels patient is having a psychotic break. Patient is a poor historian and speaks in a tangential language and appears to be delusional. Patient also displayed thought blocking. Patient denies suicidal / homicidal ideations and denies experiencing auditory / visual hallucinations. Patient report he taking his medication as prescribed but patient's mother feels patient is not taking his medication. Patient was last seen in the ED 08/09/2017 for bizarre behavior and last seen for an assessment 3.28.2019. Patient psychiatrist is Dr. Adele Schilder.   Pt shares, "I'm doing good, just tired." He denies any specific concerns today. He is sleeping well. His appetite is good. He denies other physical complaints. He denies SI/HI/AH/VH. He is tolerating his medications well and he is in agreement to continue his current regimen without changes. The Md had spoken with pt's mother via telephone regarding his plan, and she described feeling overwhelmed and unsure of the best course of action for pt's discharge plan. The Md & patient;s mother discussed looking into options for pt to stay with family if there are not available options, we will look into alternatives after the weekend. Pt's mother was in agreement with that plan. Pt was in agreement with the above plan as well and he had no further questions, comments, or concerns.  Principal Problem: Schizophrenia, paranoid type ([place]) Diagnosis:   Patient Active Problem List   Diagnosis Date Noted  . Schizophrenia, paranoid type ([place]) [F20.0] 11/18/2017  . DKA (diabetic ketoacidoses) ([place]) [E13.10] 11/05/2017   . Acute renal failure superimposed on stage 3 chronic kidney disease ([place]) [N17.9, N18.3] 11/05/2017  . Type II diabetes mellitus with renal manifestations ([place]) [E11.29] 11/05/2017  . Depression [F32.9] 11/05/2017  . Leukocytosis [D72.829] 11/05/2017  . Cough [R05] 11/05/2017  . Tobacco abuse [Z72.0] 11/05/2017  . Schizoaffective disorder, bipolar type ([place]) [F25.0] 06/10/2016  . DM (diabetes mellitus) type II uncontrolled, periph vascular disorder ([place]) [E11.51, E11.65] 02/29/2016  . Schizophrenia, disorganized type ([place]) [F20.1] 06/02/2015  . Hyperlipidemia [E78.5] 09/23/2014  . Renal insufficiency [N28.9] 09/23/2014  . HTN (hypertension) [I10] 03/22/2013  . Seasonal allergies [J30.2] 03/22/2013   Total Time spent with patient: 15 minutes  Past Psychiatric History: See H&P  Past Medical History:  Past Medical History:  Diagnosis Date  . Diabetes mellitus type II, uncontrolled ([place])   . HTN (hypertension)   . Hyperlipidemia LDL goal <70   . Schizophrenia ([place])    History reviewed. No pertinent surgical history. Family History:  Family History  Problem Relation Age of Onset  . Breast cancer Mother   . Diabetes Mother   . Prostate cancer Father        StepFather  . Hypertension Father   . Breast cancer Maternal Aunt   . Diabetes Maternal Uncle    Family Psychiatric  History: See H&P  Social History:  Social History   Substance and Sexual Activity  Alcohol Use Yes   Comment: occasional     Social History   Substance and Sexual Activity  Drug Use Yes  . Types: Marijuana    Social History   Socioeconomic History  . Marital status: Single    Spouse name: Not on file  . Number of children:  Not on file  . Years of education: Not on file  . Highest education level: Not on file  Occupational History  . Not on file  Social Needs  . Financial resource strain: Not on file  . Food insecurity:    Worry: Not on file    Inability: Not on file  . Transportation  needs:    Medical: Not on file    Non-medical: Not on file  Tobacco Use  . Smoking status: Current Every Day Smoker    Packs/day: 2.00    Years: 30.00    Pack years: 60.00    Types: Cigarettes, Cigars  . Smokeless tobacco: Never Used  Substance and Sexual Activity  . Alcohol use: Yes    Comment: occasional  . Drug use: Yes    Types: Marijuana  . Sexual activity: Never  Lifestyle  . Physical activity:    Days per week: Not on file    Minutes per session: Not on file  . Stress: Not on file  Relationships  . Social connections:    Talks on phone: Not on file    Gets together: Not on file    Attends religious service: Not on file    Active member of club or organization: Not on file    Attends meetings of clubs or organizations: Not on file    Relationship status: Not on file  Other Topics Concern  . Not on file  Social History Narrative   Lives with mom   unemployed   No children   Additional Social History:   Sleep: Good  Appetite:  Good  Current Medications: Current Facility-Administered Medications  Medication Dose Route Frequency Provider Last Rate Last Dose  . acetaminophen (TYLENOL) tablet 650 mg  650 mg Oral Q6H PRN Ethelene Hal, NP   650 mg at 11/20/17 0134  . alum & mag hydroxide-simeth (MAALOX/MYLANTA) 200-200-20 MG/5ML suspension 30 mL  30 mL Oral Q4H PRN Ethelene Hal, NP      . hydrOXYzine (ATARAX/VISTARIL) tablet 25 mg  25 mg Oral TID PRN Ethelene Hal, NP   25 mg at 11/19/17 2132  . insulin aspart (novoLOG) injection 0-9 Units  0-9 Units Subcutaneous TID WC Ethelene Hal, NP   2 Units at 11/26/17 1721  . insulin glargine (LANTUS) injection 60 Units  60 Units Subcutaneous Daily Ethelene Hal, NP   60 Units at 11/27/17 0752  . loratadine (CLARITIN) tablet 10 mg  10 mg Oral Daily Ethelene Hal, NP   10 mg at 11/27/17 0750  . magnesium hydroxide (MILK OF MAGNESIA) suspension 30 mL  30 mL Oral Daily PRN Ethelene Hal, NP      . metoprolol tartrate (LOPRESSOR) tablet 25 mg  25 mg Oral BID Ethelene Hal, NP   25 mg at 11/27/17 0749  . mirtazapine (REMERON) tablet 7.5 mg  7.5 mg Oral QHS Cobos, Myer Peer, MD   7.5 mg at 11/26/17 2128  . mometasone-formoterol (DULERA) 200-5 MCG/ACT inhaler 2 puff  2 puff Inhalation BID Ethelene Hal, NP   2 puff at 11/27/17 0750  . nicotine polacrilex (NICORETTE) gum 2 mg  2 mg Oral PRN Derrill Center, NP   2 mg at 11/27/17 0752  . repaglinide (PRANDIN) tablet 2 mg  2 mg Oral BID AC Ethelene Hal, NP   2 mg at 11/27/17 0748  . risperiDONE (RISPERDAL) tablet 3 mg  3 mg Oral QHS Derrill Center, NP  3 mg at 11/26/17 2128   And  . risperiDONE (RISPERDAL) tablet 1 mg  1 mg Oral Q breakfast Derrill Center, NP   1 mg at 11/27/17 0748  . simvastatin (ZOCOR) tablet 40 mg  40 mg Oral QHS Ethelene Hal, NP   40 mg at 11/26/17 2128  . traZODone (DESYREL) tablet 50 mg  50 mg Oral QHS PRN Ethelene Hal, NP   50 mg at 11/19/17 2132  . Vitamin D (Ergocalciferol) (DRISDOL) capsule 50,000 Units  50,000 Units Oral Weekly Ethelene Hal, NP   50,000 Units at 11/21/17 0759   Lab Results:  Results for orders placed or performed during the hospital encounter of 11/18/17 (from the past 48 hour(s))  Glucose, capillary     Status: Abnormal   Collection Time: 11/25/17  8:24 PM  Result Value Ref Range   Glucose-Capillary 223 (H) 70 - 99 mg/dL   Comment 1 Notify RN   Glucose, capillary     Status: Abnormal   Collection Time: 11/26/17  6:01 AM  Result Value Ref Range   Glucose-Capillary 104 (H) 70 - 99 mg/dL  Glucose, capillary     Status: Abnormal   Collection Time: 11/26/17 12:09 PM  Result Value Ref Range   Glucose-Capillary 100 (H) 70 - 99 mg/dL  Glucose, capillary     Status: Abnormal   Collection Time: 11/26/17  4:55 PM  Result Value Ref Range   Glucose-Capillary 176 (H) 70 - 99 mg/dL  Glucose, capillary     Status: Abnormal    Collection Time: 11/26/17  8:55 PM  Result Value Ref Range   Glucose-Capillary 212 (H) 70 - 99 mg/dL  Glucose, capillary     Status: Abnormal   Collection Time: 11/27/17  6:16 AM  Result Value Ref Range   Glucose-Capillary 119 (H) 70 - 99 mg/dL  Glucose, capillary     Status: Abnormal   Collection Time: 11/27/17 12:06 PM  Result Value Ref Range   Glucose-Capillary 103 (H) 70 - 99 mg/dL  Glucose, capillary     Status: Abnormal   Collection Time: 11/27/17  5:02 PM  Result Value Ref Range   Glucose-Capillary 142 (H) 70 - 99 mg/dL   Blood Alcohol level:  Lab Results  Component Value Date   ETH <10 11/17/2017   ETH <10 54/65/0354   Metabolic Disorder Labs: Lab Results  Component Value Date   HGBA1C 10.8 (H) 11/20/2017   MPG 263.26 11/20/2017   MPG 278 11/06/2017   Lab Results  Component Value Date   PROLACTIN 63.7 (H) 11/20/2017   Lab Results  Component Value Date   CHOL 116 11/20/2017   TRIG 108 11/20/2017   HDL 41 11/20/2017   CHOLHDL 2.8 11/20/2017   VLDL 22 11/20/2017   LDLCALC 53 11/20/2017   LDLCALC 66 09/30/2017   Physical Findings: AIMS: Facial and Oral Movements Muscles of Facial Expression: None, normal Lips and Perioral Area: None, normal Jaw: None, normal Tongue: None, normal,Extremity Movements Upper (arms, wrists, hands, fingers): None, normal Lower (legs, knees, ankles, toes): None, normal, Trunk Movements Neck, shoulders, hips: None, normal, Overall Severity Severity of abnormal movements (highest score from questions above): None, normal Incapacitation due to abnormal movements: None, normal Patient's awareness of abnormal movements (rate only patient's report): No Awareness, Dental Status Current problems with teeth and/or dentures?: No Does patient usually wear dentures?: No  CIWA:    COWS:     Musculoskeletal: Strength & Muscle Tone: within normal limits Gait &  Station: normal Patient leans: N/A  Psychiatric Specialty Exam: Physical  Exam  Nursing note and vitals reviewed.   Review of Systems  Constitutional: Negative for chills and fever.  Respiratory: Negative for cough and shortness of breath.   Cardiovascular: Negative for chest pain.  Gastrointestinal: Negative for abdominal pain, heartburn, nausea and vomiting.  Psychiatric/Behavioral: Negative for depression, hallucinations and suicidal ideas. The patient is not nervous/anxious and does not have insomnia.     Blood pressure 135/90, pulse 97, temperature 98.7 F (37.1 C), temperature source Oral, resp. rate 20, height 5\' 10"  (1.778 m), weight 108 kg, SpO2 98 %.Body mass index is 34.15 kg/m.  General Appearance: Casual and Disheveled  Eye Contact:  Good  Speech:  Clear and Coherent and Normal Rate  Volume:  Normal  Mood:  Euthymic  Affect:  Appropriate, Congruent and Flat  Thought Process:  Coherent and Goal Directed  Orientation:  Full (Time, Place, and Person)  Thought Content:  Logical  Suicidal Thoughts:  No  Homicidal Thoughts:  No  Memory:  Immediate;   Fair Recent;   Fair Remote;   Fair  Judgement:  Poor  Insight:  Lacking  Psychomotor Activity:  Normal  Concentration:  Concentration: Fair  Recall:  [name] of Knowledge:  Fair  Language:  Fair  Akathisia:  No  Handed:    AIMS (if indicated):     Assets:  Resilience Social Support  ADL's:  Intact  Cognition:  WNL  Sleep:  Number of Hours: 4.25   Treatment Plan Summary: Daily contact with patient to assess and evaluate symptoms and progress in treatment and Medication management   - Continue inpatient hospitalization.  Will continue today 11/27/2017 plan as below except where it is noted.  -Schizophrenia -Continue Risperdal 1mg  po qAM +3 mg po qhs  Insomnia -Continue with Trazodone 50mg  qhs prn insomnia -Continue remeron 15mg  po qhs  -HLD -Continue simvastatin 40mg  po  qDay  -COPD -Continue dulera 200-5 mcg/act take 2 puffs BID  -HTN -Continue lopressor 25mg  po BID  -seasonal allergy -Continue claritin 10mg  po qDay  -DMII -Continue current SSI orders with lantus and novolog -Continue repalinide 2mg  po BID  -anxiety -Continue vistaril 25mg  po TID prn anxiety  Will continue to monitor vitals ,medication compliance and treatment side effects while patient is here.   CSW willcontinueworking on disposition.  Patient to participate in therapeutic milieu  Lindell Spar, NP, PMHNP, FNP-BC. 11/27/2017, 5:05 PMPatient ID: [name] [name]., male   DOB: September 17, 1965, 52 y.o.   MRN: [id number] Patient ID: Sara Selvidge., male   DOB: 07-May-1965, 52 y.o.   MRN: [id number]

## 2017-11-27 NOTE — Progress Notes (Addendum)
A lady by the name Kendra Opitz came to see the pt stating the meeting was arrange by Rod the social worker. Pt declined the meeting stating he does not  know anybody by that name.

## 2017-11-28 LAB — GLUCOSE, CAPILLARY
GLUCOSE-CAPILLARY: 198 mg/dL — AB (ref 70–99)
GLUCOSE-CAPILLARY: 92 mg/dL (ref 70–99)
GLUCOSE-CAPILLARY: 206 mg/dL — AB (ref 70–99)
GLUCOSE-CAPILLARY: 256 mg/dL — AB (ref 70–99)

## 2017-11-28 NOTE — Progress Notes (Signed)
Recreation Therapy Notes  Date: 9.16.19 Time: 1000 Location: 500 Hall Dayroom  Group Topic: Wellness  Goal Area(s) Addresses:  Patient will define components of whole wellness. Patient will verbalize benefit of whole wellness.  Intervention:  Exercise, Music  Activity: Exercise.  LRT lead the group through a series of stretches.  Each group member would then lead the group in an exercise of their choice.  Patients and LRT went through a number of exercises and dance moves to get the heart pumping and blood flowing.  Education: Wellness, Dentist.   Education Outcome: Acknowledges education/In group clarification offered/Needs additional education.   Clinical Observations/Feedback:  Pt did not attend group.    Victorino Sparrow, LRT/CTRS      Victorino Sparrow A 11/28/2017 12:48 PM

## 2017-11-28 NOTE — Progress Notes (Signed)
Patient has had decreased disorganization and intrusive behaviors. Patient denies SI, HI and AVH. Patient has attended groups and participated in unit activities.   Assess patient for safety offer medications as prescribed engage patient in 1:1 staff talks.   Continue to monitor as planned. Patient able to contract for safety

## 2017-11-28 NOTE — Progress Notes (Signed)
Patient ID: [name] Sizelove., male   DOB: 12-15-1965, 52 y.o.   MRN: [id number] D: Patient observed watching TV and interacting well with peers on approach. Pt calm and cooperative. Pt reports he is doing well and tolerating medication. Pt attended evening wrap up group and engaged in discussion.  Pt denies SI/HI/AVH and pain.No behavioral issues noted.  A: Support and encouragement offered as needed to express needs. Medications administered as prescribed.  R: Patient is safe and cooperative on unit. Will continue to monitor  for safety and stability.

## 2017-11-28 NOTE — [facility] Group Notes (Signed)

## 2017-11-28 NOTE — [facility] Counselor (Signed)
Called mother-message stated my call could not currently be completed.  Unable to leave a message.  Called number for sister and left VM message to call back.

## 2017-11-28 NOTE — Progress Notes (Signed)
Patient ID: [name] [name]., male   DOB: [birth year], 52 y.o.   MRN: [id number]  D: Patient pleasant on approach tonight. Hyperverbal and tangential in conversation. Talking about his diabetes tonight. "My blood sugar problems come from too low of a blood count". Patient interacting well with other peers tonight.  A: Staff will monitor on q 15 minute checks, follow treatment plan, and give medications as ordered. R: Took medications without issue.

## 2017-11-28 NOTE — Progress Notes (Signed)
[facility] MD Progress Note  11/28/2017 4:46 PM [name] [name].  MRN:  [id number] Subjective:    HistoryPer assessment:-[name] Vaughnis an 52 y.o.[facility] accompanied by his mother with complaints of bizarre behavior and auditory hallucinations. Per report from patient's mother she feels patient is having a psychotic break. Patient is a poor historian and speaks in a tangential language and appears to be delusional. Patient also displayed thought blocking. Patient denies suicidal / homicidal ideations and denies experiencing auditory / visual hallucinations. Patient report he taking his medication as prescribed but patient's mother feels patient is not taking his medication. Patient was last seen in the ED 08/09/2017 for bizarre behavior and last seen for an assessment 3.28.2019. Patient psychiatrist is Dr. Adele Schilder.  Today upon evaluation: Ptshares, "It's basically kind of boring out there." He denies any specific concerns today. He is sleeping well. His appetite is good. He denies other physical complaints. He denies SI/HI/AH/VH. He is tolerating his medications well and he is in agreement to continue his current regimen without changes. He continues to state that he plans to stay with his uncle after discharge and he turned away staff from a group home over the weekend as he feels he will not need to stay there. Discussed with patient that understanding of the treatment team is that he will not be able to stay with his mother or his uncle after discharge, so he will need to look at alternative options. Pt states he will check with his mother about this. He agrees to continue his current regimen without changes, and we discussed that if pt cannot stay with family that we will have to look into group homes. He verbalized good understanding.  Principal Problem: Schizophrenia, paranoid type ([place]) Diagnosis:   Patient Active Problem List   Diagnosis Date Noted  .  Schizophrenia, paranoid type ([place]) [F20.0] 11/18/2017  . DKA (diabetic ketoacidoses) ([place]) [E13.10] 11/05/2017  . Acute renal failure superimposed on stage 3 chronic kidney disease ([place]) [N17.9, N18.3] 11/05/2017  . Type II diabetes mellitus with renal manifestations ([place]) [E11.29] 11/05/2017  . Depression [F32.9] 11/05/2017  . Leukocytosis [D72.829] 11/05/2017  . Cough [R05] 11/05/2017  . Tobacco abuse [Z72.0] 11/05/2017  . Schizoaffective disorder, bipolar type ([place]) [F25.0] 06/10/2016  . DM (diabetes mellitus) type II uncontrolled, periph vascular disorder ([place]) [E11.51, E11.65] 02/29/2016  . Schizophrenia, disorganized type ([place]) [F20.1] 06/02/2015  . Hyperlipidemia [E78.5] 09/23/2014  . Renal insufficiency [N28.9] 09/23/2014  . HTN (hypertension) [I10] 03/22/2013  . Seasonal allergies [J30.2] 03/22/2013   Total Time spent with patient: 30 minutes  Past Psychiatric History: see H&P  Past Medical History:  Past Medical History:  Diagnosis Date  . Diabetes mellitus type II, uncontrolled ([place])   . HTN (hypertension)   . Hyperlipidemia LDL goal <70   . Schizophrenia ([place])    History reviewed. No pertinent surgical history. Family History:  Family History  Problem Relation Age of Onset  . Breast cancer Mother   . Diabetes Mother   . Prostate cancer Father        StepFather  . Hypertension Father   . Breast cancer Maternal Aunt   . Diabetes Maternal Uncle    Family Psychiatric  History: see H&P Social History:  Social History   Substance and Sexual Activity  Alcohol Use Yes   Comment: occasional     Social History   Substance and Sexual Activity  Drug Use Yes  . Types: Marijuana    Social  History   Socioeconomic History  . Marital status: Single    Spouse name: Not on file  . Number of children: Not on file  . Years of education: Not on file  . Highest education level: Not on file  Occupational History  . Not on file  Social Needs  . Financial resource  strain: Not on file  . Food insecurity:    Worry: Not on file    Inability: Not on file  . Transportation needs:    Medical: Not on file    Non-medical: Not on file  Tobacco Use  . Smoking status: Current Every Day Smoker    Packs/day: 2.00    Years: 30.00    Pack years: 60.00    Types: Cigarettes, Cigars  . Smokeless tobacco: Never Used  Substance and Sexual Activity  . Alcohol use: Yes    Comment: occasional  . Drug use: Yes    Types: Marijuana  . Sexual activity: Never  Lifestyle  . Physical activity:    Days per week: Not on file    Minutes per session: Not on file  . Stress: Not on file  Relationships  . Social connections:    Talks on phone: Not on file    Gets together: Not on file    Attends religious service: Not on file    Active member of club or organization: Not on file    Attends meetings of clubs or organizations: Not on file    Relationship status: Not on file  Other Topics Concern  . Not on file  Social History Narrative   Lives with mom   unemployed   No children   Additional Social History:                         Sleep: Good  Appetite:  Good  Current Medications: Current Facility-Administered Medications  Medication Dose Route Frequency Provider Last Rate Last Dose  . acetaminophen (TYLENOL) tablet 650 mg  650 mg Oral Q6H PRN Ethelene Hal, NP   650 mg at 11/20/17 0134  . alum & mag hydroxide-simeth (MAALOX/MYLANTA) 200-200-20 MG/5ML suspension 30 mL  30 mL Oral Q4H PRN Ethelene Hal, NP      . hydrOXYzine (ATARAX/VISTARIL) tablet 25 mg  25 mg Oral TID PRN Ethelene Hal, NP   25 mg at 11/19/17 2132  . insulin aspart (novoLOG) injection 0-9 Units  0-9 Units Subcutaneous TID WC Ethelene Hal, NP   2 Units at 11/28/17 [phone]  . insulin glargine (LANTUS) injection 60 Units  60 Units Subcutaneous Daily Ethelene Hal, NP   60 Units at 11/28/17 0831  . loratadine (CLARITIN) tablet 10 mg  10 mg Oral  Daily Ethelene Hal, NP   10 mg at 11/28/17 0831  . magnesium hydroxide (MILK OF MAGNESIA) suspension 30 mL  30 mL Oral Daily PRN Ethelene Hal, NP      . metoprolol tartrate (LOPRESSOR) tablet 25 mg  25 mg Oral BID Ethelene Hal, NP   25 mg at 11/28/17 0831  . mirtazapine (REMERON) tablet 7.5 mg  7.5 mg Oral QHS Cobos, Myer Peer, MD   7.5 mg at 11/27/17 2243  . mometasone-formoterol (DULERA) 200-5 MCG/ACT inhaler 2 puff  2 puff Inhalation BID Ethelene Hal, NP   2 puff at 11/28/17 [phone]  . nicotine polacrilex (NICORETTE) gum 2 mg  2 mg Oral PRN Derrill Center, NP   2  mg at 11/27/17 0752  . repaglinide (PRANDIN) tablet 2 mg  2 mg Oral BID AC Ethelene Hal, NP   2 mg at 11/28/17 1610  . risperiDONE (RISPERDAL) tablet 3 mg  3 mg Oral QHS Derrill Center, NP   3 mg at 11/27/17 2243   And  . risperiDONE (RISPERDAL) tablet 1 mg  1 mg Oral Q breakfast Derrill Center, NP   1 mg at 11/28/17 0831  . simvastatin (ZOCOR) tablet 40 mg  40 mg Oral QHS Ethelene Hal, NP   40 mg at 11/27/17 2243  . traZODone (DESYREL) tablet 50 mg  50 mg Oral QHS PRN Ethelene Hal, NP   50 mg at 11/19/17 2132  . Vitamin D (Ergocalciferol) (DRISDOL) capsule 50,000 Units  50,000 Units Oral Weekly Ethelene Hal, NP   50,000 Units at 11/28/17 0831    Lab Results:  Results for orders placed or performed during the hospital encounter of 11/18/17 (from the past 48 hour(s))  Glucose, capillary     Status: Abnormal   Collection Time: 11/26/17  4:55 PM  Result Value Ref Range   Glucose-Capillary 176 (H) 70 - 99 mg/dL  Glucose, capillary     Status: Abnormal   Collection Time: 11/26/17  8:55 PM  Result Value Ref Range   Glucose-Capillary 212 (H) 70 - 99 mg/dL  Glucose, capillary     Status: Abnormal   Collection Time: 11/27/17  6:16 AM  Result Value Ref Range   Glucose-Capillary 119 (H) 70 - 99 mg/dL  Glucose, capillary     Status: Abnormal   Collection Time:  11/27/17 12:06 PM  Result Value Ref Range   Glucose-Capillary 103 (H) 70 - 99 mg/dL  Glucose, capillary     Status: Abnormal   Collection Time: 11/27/17  5:02 PM  Result Value Ref Range   Glucose-Capillary 142 (H) 70 - 99 mg/dL  Glucose, capillary     Status: Abnormal   Collection Time: 11/27/17  8:34 PM  Result Value Ref Range   Glucose-Capillary 219 (H) 70 - 99 mg/dL  Glucose, capillary     Status: Abnormal   Collection Time: 11/28/17  7:06 AM  Result Value Ref Range   Glucose-Capillary 198 (H) 70 - 99 mg/dL  Glucose, capillary     Status: None   Collection Time: 11/28/17 11:56 AM  Result Value Ref Range   Glucose-Capillary 92 70 - 99 mg/dL    Blood Alcohol level:  Lab Results  Component Value Date   ETH <10 11/17/2017   ETH <10 96/06/5407    Metabolic Disorder Labs: Lab Results  Component Value Date   HGBA1C 10.8 (H) 11/20/2017   MPG 263.26 11/20/2017   MPG 278 11/06/2017   Lab Results  Component Value Date   PROLACTIN 63.7 (H) 11/20/2017   Lab Results  Component Value Date   CHOL 116 11/20/2017   TRIG 108 11/20/2017   HDL 41 11/20/2017   CHOLHDL 2.8 11/20/2017   VLDL 22 11/20/2017   LDLCALC 53 11/20/2017   LDLCALC 66 09/30/2017    Physical Findings: AIMS: Facial and Oral Movements Muscles of Facial Expression: None, normal Lips and Perioral Area: None, normal Jaw: None, normal Tongue: None, normal,Extremity Movements Upper (arms, wrists, hands, fingers): None, normal Lower (legs, knees, ankles, toes): None, normal, Trunk Movements Neck, shoulders, hips: None, normal, Overall Severity Severity of abnormal movements (highest score from questions above): None, normal Incapacitation due to abnormal movements: None, normal Patient's awareness of  abnormal movements (rate only patient's report): No Awareness, Dental Status Current problems with teeth and/or dentures?: No Does patient usually wear dentures?: No  CIWA:    COWS:      Musculoskeletal: Strength & Muscle Tone: within normal limits Gait & Station: normal Patient leans: N/A  Psychiatric Specialty Exam: Physical Exam  Nursing note and vitals reviewed.   Review of Systems  Constitutional: Negative for chills and fever.  Respiratory: Negative for cough and shortness of breath.   Cardiovascular: Negative for chest pain.  Gastrointestinal: Negative for abdominal pain, heartburn, nausea and vomiting.  Psychiatric/Behavioral: Negative for depression, hallucinations and suicidal ideas. The patient is not nervous/anxious and does not have insomnia.     Blood pressure 118/87, pulse 92, temperature 98.6 F (37 C), temperature source Oral, resp. rate 16, height 5\' 10"  (1.778 m), weight 108 kg, SpO2 98 %.Body mass index is 34.15 kg/m.  General Appearance: Casual and Disheveled  Eye Contact:  Good  Speech:  Clear and Coherent and Normal Rate  Volume:  Normal  Mood:  Euthymic  Affect:  Appropriate and Congruent  Thought Process:  Coherent and Goal Directed  Orientation:  Full (Time, Place, and Person)  Thought Content:  Logical  Suicidal Thoughts:  No  Homicidal Thoughts:  No  Memory:  Immediate;   Fair Recent;   Fair Remote;   Fair  Judgement:  Poor  Insight:  Lacking  Psychomotor Activity:  Normal  Concentration:  Concentration: Fair  Recall:  [name] of Knowledge:  Fair  Language:  Fair  Akathisia:  No  Handed:    AIMS (if indicated):     Assets:  Resilience Social Support  ADL's:  Intact  Cognition:  WNL  Sleep:  Number of Hours: 5.75     Treatment Plan Summary: Daily contact with patient to assess and evaluate symptoms and progress in treatment and Medication management   - Continue inpatient hospitalization.  -Will continue today9/16/2019plan as below except where it is noted.  -Schizophrenia -Continue Risperdal 1mg  po qAM +3 mg po qhs  Insomnia -Continue with Trazodone 50mg  qhs prn  insomnia -Continue remeron 15mg  po qhs  -HLD -Continue simvastatin 40mg  po qDay  -COPD -Continue dulera 200-5 mcg/act take 2 puffs BID  -HTN -Continue lopressor 25mg  po BID  -seasonal allergy -Continue claritin 10mg  po qDay  -DMII -Continue current SSI orders with lantus and novolog -Continue repalinide 2mg  po BID  -anxiety -Continue vistaril 25mg  po TID prn anxiety  Will continue to monitor vitals ,medication compliance and treatment side effects while patient is here.   CSW willcontinueworking on disposition.  Patient to participate in therapeutic milieu   Pennelope Bracken, MD 11/28/2017, 4:46 PM

## 2017-11-29 ENCOUNTER — Other Ambulatory Visit (HOSPITAL_COMMUNITY): Payer: Self-pay | Admitting: Psychiatry

## 2017-11-29 DIAGNOSIS — F209 Schizophrenia, unspecified: Secondary | ICD-10-CM

## 2017-11-29 LAB — GLUCOSE, CAPILLARY
GLUCOSE-CAPILLARY: 310 mg/dL — AB (ref 70–99)
Glucose-Capillary: 178 mg/dL — ABNORMAL HIGH (ref 70–99)
Glucose-Capillary: 122 mg/dL — ABNORMAL HIGH (ref 70–99)
Glucose-Capillary: 237 mg/dL — ABNORMAL HIGH (ref 70–99)

## 2017-11-29 NOTE — Progress Notes (Signed)
Patient has had decreased disorganization and intrusive behaviors. Patient denies SI, HI and AVH. Patient has attended groups and participated in unit activities.   Assess patient for safety offer medications as prescribed engage patient in 1:1 staff talks.   Continue to monitor as planned. Patient able to contract for safety

## 2017-11-29 NOTE — Progress Notes (Signed)
Nursing Progress Note: 7p-7a D: Pt currently presents with a pleasant/cooperative/blocking affect and behavior. Interacting appropriately with the milieu. Pt reports good sleep during the previous night with current medication regimen. Pt did attend wrap-up group.  A: Pt provided with medications per providers orders. Pt's labs and vitals were monitored throughout the night. Pt supported emotionally and encouraged to express concerns and questions. Pt educated on medications.  R: Pt's safety ensured with 15 minute and environmental checks. Pt currently denies SI, HI, and AVH. Pt verbally contracts to seek staff if SI,HI, or AVH occurs and to consult with staff before acting on any harmful thoughts. Will continue to monitor.

## 2017-11-29 NOTE — [facility] Group Notes (Signed)
LCSW Group Therapy Note   11/29/2017 1:15pm   Type of Therapy and Topic:  Group Therapy:  Positive Affirmations   Participation Level:  Did Not Attend  Description of Group: This group addressed positive affirmation toward self and others. Patients went around the room and identified two positive things about themselves and two positive things about a peer in the room. Patients reflected on how it felt to share something positive with others, to identify positive things about themselves, and to hear positive things from others. Patients were encouraged to have a daily reflection of positive characteristics or circumstances.  Therapeutic Goals 1. Patient will verbalize two of their positive qualities 2. Patient will demonstrate empathy for others by stating two positive qualities about a peer in the group 3. Patient will verbalize their feelings when voicing positive self affirmations and when voicing positive affirmations of others 4. Patients will discuss the potential positive impact on their wellness/recovery of focusing on positive traits of self and others. Summary of Patient Progress:    Therapeutic Modalities Cognitive Behavioral Therapy Motivational Interviewing  Trish Mage, [place] 11/29/2017 3:16 PM

## 2017-11-29 NOTE — Progress Notes (Signed)
[facility] MD Progress Note  11/29/2017 12:42 PM [name] [name].  MRN:  [id number] Subjective:    HistoryPer initial assessment:-[name] Vaughnis an 52 y.o.[facility] accompanied by his mother with complaints of bizarre behavior and auditory hallucinations. Per report from patient's mother she feels patient is having a psychotic break. Patient is a poor historian and speaks in a tangential language and appears to be delusional. Patient also displayed thought blocking. Patient denies suicidal / homicidal ideations and denies experiencing auditory / visual hallucinations. Patient report he taking his medication as prescribed but patient's mother feels patient is not taking his medication. Patient was last seen in the ED 08/09/2017 for bizarre behavior and last seen for an assessment 3.28.2019. Patient psychiatrist is Dr. Adele Schilder.  Today upon evaluation: Ptshares, "I'm doing good. I'm going to stay with my uncle." He denies any specific concerns today. He is sleeping well. His appetite is good. He denies other physical complaints. He denies SI/HI/AH/VH. He is tolerating his medications well and he is in agreement to continue his current regimen without changes.He continues to state that he plans to stay with his uncle after discharge, and he sister came yesterday evening to discuss with him that will not be an immediate option. Discussed with patient that we could seek group home placement if he would be willing to commit to staying at one location for at least 12 months, and with encouragement from his sister via telephone pt was in agreement for referral to group home.  He had no further questions, comments, or concerns.  Principal Problem: Schizophrenia, paranoid type ([place]) Diagnosis:   Patient Active Problem List   Diagnosis Date Noted  . Schizophrenia, paranoid type ([place]) [F20.0] 11/18/2017  . DKA (diabetic ketoacidoses) ([place]) [E13.10] 11/05/2017  . Acute renal  failure superimposed on stage 3 chronic kidney disease ([place]) [N17.9, N18.3] 11/05/2017  . Type II diabetes mellitus with renal manifestations ([place]) [E11.29] 11/05/2017  . Depression [F32.9] 11/05/2017  . Leukocytosis [D72.829] 11/05/2017  . Cough [R05] 11/05/2017  . Tobacco abuse [Z72.0] 11/05/2017  . Schizoaffective disorder, bipolar type ([place]) [F25.0] 06/10/2016  . DM (diabetes mellitus) type II uncontrolled, periph vascular disorder ([place]) [E11.51, E11.65] 02/29/2016  . Schizophrenia, disorganized type ([place]) [F20.1] 06/02/2015  . Hyperlipidemia [E78.5] 09/23/2014  . Renal insufficiency [N28.9] 09/23/2014  . HTN (hypertension) [I10] 03/22/2013  . Seasonal allergies [J30.2] 03/22/2013   Total Time spent with patient: 30 minutes  Past Psychiatric History: see H&P  Past Medical History:  Past Medical History:  Diagnosis Date  . Diabetes mellitus type II, uncontrolled ([place])   . HTN (hypertension)   . Hyperlipidemia LDL goal <70   . Schizophrenia ([place])    History reviewed. No pertinent surgical history. Family History:  Family History  Problem Relation Age of Onset  . Breast cancer Mother   . Diabetes Mother   . Prostate cancer Father        StepFather  . Hypertension Father   . Breast cancer Maternal Aunt   . Diabetes Maternal Uncle    Family Psychiatric  History: see H&P Social History:  Social History   Substance and Sexual Activity  Alcohol Use Yes   Comment: occasional     Social History   Substance and Sexual Activity  Drug Use Yes  . Types: Marijuana    Social History   Socioeconomic History  . Marital status: Single    Spouse name: Not on file  . Number of children: Not on file  .  Years of education: Not on file  . Highest education level: Not on file  Occupational History  . Not on file  Social Needs  . Financial resource strain: Not on file  . Food insecurity:    Worry: Not on file    Inability: Not on file  . Transportation needs:    Medical:  Not on file    Non-medical: Not on file  Tobacco Use  . Smoking status: Current Every Day Smoker    Packs/day: 2.00    Years: 30.00    Pack years: 60.00    Types: Cigarettes, Cigars  . Smokeless tobacco: Never Used  Substance and Sexual Activity  . Alcohol use: Yes    Comment: occasional  . Drug use: Yes    Types: Marijuana  . Sexual activity: Never  Lifestyle  . Physical activity:    Days per week: Not on file    Minutes per session: Not on file  . Stress: Not on file  Relationships  . Social connections:    Talks on phone: Not on file    Gets together: Not on file    Attends religious service: Not on file    Active member of club or organization: Not on file    Attends meetings of clubs or organizations: Not on file    Relationship status: Not on file  Other Topics Concern  . Not on file  Social History Narrative   Lives with mom   unemployed   No children   Additional Social History:                         Sleep: Good  Appetite:  Good  Current Medications: Current Facility-Administered Medications  Medication Dose Route Frequency Provider Last Rate Last Dose  . acetaminophen (TYLENOL) tablet 650 mg  650 mg Oral Q6H PRN Ethelene Hal, NP   650 mg at 11/20/17 0134  . alum & mag hydroxide-simeth (MAALOX/MYLANTA) 200-200-20 MG/5ML suspension 30 mL  30 mL Oral Q4H PRN Ethelene Hal, NP      . hydrOXYzine (ATARAX/VISTARIL) tablet 25 mg  25 mg Oral TID PRN Ethelene Hal, NP   25 mg at 11/19/17 2132  . insulin aspart (novoLOG) injection 0-9 Units  0-9 Units Subcutaneous TID WC Ethelene Hal, NP   2 Units at 11/29/17 [id number]  . insulin glargine (LANTUS) injection 60 Units  60 Units Subcutaneous Daily Ethelene Hal, NP   60 Units at 11/29/17 0820  . loratadine (CLARITIN) tablet 10 mg  10 mg Oral Daily Ethelene Hal, NP   10 mg at 11/29/17 0819  . magnesium hydroxide (MILK OF MAGNESIA) suspension 30 mL  30 mL Oral  Daily PRN Ethelene Hal, NP      . metoprolol tartrate (LOPRESSOR) tablet 25 mg  25 mg Oral BID Ethelene Hal, NP   25 mg at 11/29/17 0818  . mirtazapine (REMERON) tablet 7.5 mg  7.5 mg Oral QHS Cobos, Myer Peer, MD   7.5 mg at 11/28/17 2201  . mometasone-formoterol (DULERA) 200-5 MCG/ACT inhaler 2 puff  2 puff Inhalation BID Ethelene Hal, NP   2 puff at 11/29/17 0819  . nicotine polacrilex (NICORETTE) gum 2 mg  2 mg Oral PRN Derrill Center, NP   2 mg at 11/28/17 2202  . repaglinide (PRANDIN) tablet 2 mg  2 mg Oral BID AC Ethelene Hal, NP   2 mg at 11/29/17 8588  .  risperiDONE (RISPERDAL) tablet 3 mg  3 mg Oral QHS Derrill Center, NP   3 mg at 11/28/17 2202   And  . risperiDONE (RISPERDAL) tablet 1 mg  1 mg Oral Q breakfast Derrill Center, NP   1 mg at 11/29/17 0819  . simvastatin (ZOCOR) tablet 40 mg  40 mg Oral QHS Ethelene Hal, NP   40 mg at 11/28/17 2201  . traZODone (DESYREL) tablet 50 mg  50 mg Oral QHS PRN Ethelene Hal, NP   50 mg at 11/19/17 2132  . Vitamin D (Ergocalciferol) (DRISDOL) capsule 50,000 Units  50,000 Units Oral Weekly Ethelene Hal, NP   50,000 Units at 11/28/17 0831    Lab Results:  Results for orders placed or performed during the hospital encounter of 11/18/17 (from the past 48 hour(s))  Glucose, capillary     Status: Abnormal   Collection Time: 11/27/17  5:02 PM  Result Value Ref Range   Glucose-Capillary 142 (H) 70 - 99 mg/dL  Glucose, capillary     Status: Abnormal   Collection Time: 11/27/17  8:34 PM  Result Value Ref Range   Glucose-Capillary 219 (H) 70 - 99 mg/dL  Glucose, capillary     Status: Abnormal   Collection Time: 11/28/17  7:06 AM  Result Value Ref Range   Glucose-Capillary 198 (H) 70 - 99 mg/dL  Glucose, capillary     Status: None   Collection Time: 11/28/17 11:56 AM  Result Value Ref Range   Glucose-Capillary 92 70 - 99 mg/dL  Glucose, capillary     Status: Abnormal   Collection  Time: 11/28/17  5:09 PM  Result Value Ref Range   Glucose-Capillary 206 (H) 70 - 99 mg/dL  Glucose, capillary     Status: Abnormal   Collection Time: 11/28/17  8:55 PM  Result Value Ref Range   Glucose-Capillary 256 (H) 70 - 99 mg/dL  Glucose, capillary     Status: Abnormal   Collection Time: 11/29/17  6:02 AM  Result Value Ref Range   Glucose-Capillary 178 (H) 70 - 99 mg/dL  Glucose, capillary     Status: Abnormal   Collection Time: 11/29/17 12:10 PM  Result Value Ref Range   Glucose-Capillary 122 (H) 70 - 99 mg/dL    Blood Alcohol level:  Lab Results  Component Value Date   ETH <10 11/17/2017   ETH <10 47/82/9562    Metabolic Disorder Labs: Lab Results  Component Value Date   HGBA1C 10.8 (H) 11/20/2017   MPG 263.26 11/20/2017   MPG 278 11/06/2017   Lab Results  Component Value Date   PROLACTIN 63.7 (H) 11/20/2017   Lab Results  Component Value Date   CHOL 116 11/20/2017   TRIG 108 11/20/2017   HDL 41 11/20/2017   CHOLHDL 2.8 11/20/2017   VLDL 22 11/20/2017   LDLCALC 53 11/20/2017   LDLCALC 66 09/30/2017    Physical Findings: AIMS: Facial and Oral Movements Muscles of Facial Expression: None, normal Lips and Perioral Area: None, normal Jaw: None, normal Tongue: None, normal,Extremity Movements Upper (arms, wrists, hands, fingers): None, normal Lower (legs, knees, ankles, toes): None, normal, Trunk Movements Neck, shoulders, hips: None, normal, Overall Severity Severity of abnormal movements (highest score from questions above): None, normal Incapacitation due to abnormal movements: None, normal Patient's awareness of abnormal movements (rate only patient's report): No Awareness, Dental Status Current problems with teeth and/or dentures?: No Does patient usually wear dentures?: No  CIWA:    COWS:  Musculoskeletal: Strength & Muscle Tone: within normal limits Gait & Station: normal Patient leans: N/A  Psychiatric Specialty Exam: Physical Exam   Nursing note and vitals reviewed.   Review of Systems  Constitutional: Negative for chills and fever.  Respiratory: Negative for cough and shortness of breath.   Cardiovascular: Negative for chest pain.  Gastrointestinal: Negative for abdominal pain, heartburn, nausea and vomiting.  Psychiatric/Behavioral: Negative for depression, hallucinations and suicidal ideas. The patient is not nervous/anxious and does not have insomnia.     Blood pressure 120/81, pulse (!) 131, temperature 98.6 F (37 C), temperature source Oral, resp. rate 20, height 5\' 10"  (1.778 m), weight 108 kg, SpO2 98 %.Body mass index is 34.15 kg/m.  General Appearance: Casual and Disheveled  Eye Contact:  Good  Speech:  Clear and Coherent and Normal Rate  Volume:  Normal  Mood:  Euthymic  Affect:  Blunt and Flat  Thought Process:  Coherent and Goal Directed  Orientation:  Full (Time, Place, and Person)  Thought Content:  Logical  Suicidal Thoughts:  No  Homicidal Thoughts:  No  Memory:  Immediate;   Fair Recent;   Fair Remote;   Fair  Judgement:  Poor  Insight:  Lacking  Psychomotor Activity:  Normal  Concentration:  Concentration: Fair  Recall:  [name] of Knowledge:  Fair  Language:  Fair  Akathisia:  No  Handed:    AIMS (if indicated):     Assets:  Resilience Social Support  ADL's:  Intact  Cognition:  WNL  Sleep:  Number of Hours: 6   Treatment Plan Summary: Daily contact with patient to assess and evaluate symptoms and progress in treatment and Medication management   - Continue inpatient hospitalization.  -Will continue today9/17/2019plan as below except where it is noted.  -Schizophrenia -Continue Risperdal 1mg  po qAM +3 mg po qhs  Insomnia -Continue with Trazodone 50mg  qhs prn insomnia -Continue remeron 15mg  po qhs  -HLD -Continue simvastatin 40mg  po qDay  -COPD -Continue dulera 200-5  mcg/act take 2 puffs BID  -HTN -Continue lopressor 25mg  po BID  -seasonal allergy -Continue claritin 10mg  po qDay  -DMII -Continue current SSI orders with lantus and novolog -Continue repalinide 2mg  po BID  -anxiety -Continue vistaril 25mg  po TID prn anxiety  Will continue to monitor vitals ,medication compliance and treatment side effects while patient is here.   CSW willcontinueworking on disposition.  Patient to participate in therapeutic milieu  Pennelope Bracken, MD 11/29/2017, 12:42 PM

## 2017-11-29 NOTE — Progress Notes (Signed)
Psychoeducational Group Note  Date:  11/29/2017 Time:  2128  Group Topic/Focus:  Wrap-Up Group:   The focus of this group is to help patients review their daily goal of treatment and discuss progress on daily workbooks.  Participation Level: Did Not Attend  Participation Quality:  Not Applicable  Affect:  Not Applicable  Cognitive:  Not Applicable  Insight:  Not Applicable  Engagement in Group: Not Applicable  Additional Comments:  The patient did not attend group this evening.   Archie Balboa S 11/29/2017, 9:28 PM

## 2017-11-29 NOTE — Progress Notes (Signed)
Recreation Therapy Notes  Date: 9.17.19 Time: 1000 Location: [address] Dayroom  Group Topic: Leisure Education  Goal Area(s) Addresses:  Patient will identify positive leisure activities.  Patient will identify one positive benefit of participation in leisure activities.   Intervention: [employer], dry erase markers, various words  Activity: Leisure Freight forwarder.  One patient would come to the board and get a slip of paper from the container.  The patient would draw the activity named on the paper onto the white board.  The patient that makes the correct guess, gets the next turn.  SN:  If the group is large enough, the group can be broken up into teams.  Education:  Leisure Education, Dentist  Education Outcome: Acknowledges education/In group clarification offered/Needs additional education  Clinical Observations/Feedback: Pt did not attend group.    Victorino Sparrow, LRT/CTRS         Victorino Sparrow A 11/29/2017 12:01 PM

## 2017-11-30 LAB — GLUCOSE, CAPILLARY
Glucose-Capillary: 145 mg/dL — ABNORMAL HIGH (ref 70–99)
Glucose-Capillary: 192 mg/dL — ABNORMAL HIGH (ref 70–99)
Glucose-Capillary: 311 mg/dL — ABNORMAL HIGH (ref 70–99)
Glucose-Capillary: 216 mg/dL — ABNORMAL HIGH (ref 70–99)

## 2017-11-30 NOTE — Progress Notes (Signed)
Patient has had decreased disorganization and intrusive behaviors. Patient denies SI, HI and AVH. Patient has attended groups and participated in unit activities.   Assess patient for safety offer medications as prescribed engage patient in 1:1 staff talks.   Continue to monitor as planned. Patient able to contract for safety

## 2017-11-30 NOTE — Tx Team (Signed)
Interdisciplinary Treatment and Diagnostic Plan Update  11/30/2017 Time of Session: 10:45am Damarie [place]. MRN: [id number]  Principal Diagnosis: Schizophrenia, paranoid type ([place])  Secondary Diagnoses: Principal Problem:   Schizophrenia, paranoid type ([place])   Current Medications:  Current Facility-Administered Medications  Medication Dose Route Frequency Provider Last Rate Last Dose  . acetaminophen (TYLENOL) tablet 650 mg  650 mg Oral Q6H PRN Ethelene Hal, NP   650 mg at 11/20/17 0134  . alum & mag hydroxide-simeth (MAALOX/MYLANTA) 200-200-20 MG/5ML suspension 30 mL  30 mL Oral Q4H PRN Ethelene Hal, NP      . hydrOXYzine (ATARAX/VISTARIL) tablet 25 mg  25 mg Oral TID PRN Ethelene Hal, NP   25 mg at 11/19/17 2132  . insulin aspart (novoLOG) injection 0-9 Units  0-9 Units Subcutaneous TID WC Ethelene Hal, NP   3 Units at 11/29/17 1651  . insulin glargine (LANTUS) injection 60 Units  60 Units Subcutaneous Daily Ethelene Hal, NP   60 Units at 11/30/17 [phone]  . loratadine (CLARITIN) tablet 10 mg  10 mg Oral Daily Ethelene Hal, NP   10 mg at 11/30/17 0755  . magnesium hydroxide (MILK OF MAGNESIA) suspension 30 mL  30 mL Oral Daily PRN Ethelene Hal, NP      . metoprolol tartrate (LOPRESSOR) tablet 25 mg  25 mg Oral BID Ethelene Hal, NP   25 mg at 11/30/17 0755  . mirtazapine (REMERON) tablet 7.5 mg  7.5 mg Oral QHS Cobos, Myer Peer, MD   7.5 mg at 11/29/17 2159  . mometasone-formoterol (DULERA) 200-5 MCG/ACT inhaler 2 puff  2 puff Inhalation BID Ethelene Hal, NP   2 puff at 11/30/17 0755  . nicotine polacrilex (NICORETTE) gum 2 mg  2 mg Oral PRN Derrill Center, NP   2 mg at 11/29/17 1817  . repaglinide (PRANDIN) tablet 2 mg  2 mg Oral BID AC Ethelene Hal, NP   2 mg at 11/30/17 0177  . risperiDONE (RISPERDAL) tablet 3 mg  3 mg Oral QHS Derrill Center, NP   3 mg at 11/29/17 2159   And  .  risperiDONE (RISPERDAL) tablet 1 mg  1 mg Oral Q breakfast Derrill Center, NP   1 mg at 11/30/17 0755  . simvastatin (ZOCOR) tablet 40 mg  40 mg Oral QHS Ethelene Hal, NP   40 mg at 11/29/17 2159  . traZODone (DESYREL) tablet 50 mg  50 mg Oral QHS PRN Ethelene Hal, NP   50 mg at 11/19/17 2132  . Vitamin D (Ergocalciferol) (DRISDOL) capsule 50,000 Units  50,000 Units Oral Weekly Ethelene Hal, NP   50,000 Units at 11/28/17 0831   PTA Medications: Medications Prior to Admission  Medication Sig Dispense Refill Last Dose  . ALLERGY RELIEF 10 MG tablet TAKE 1 TABLET BY MOUTH EVERY DAY (Patient taking differently: Take 10 mg by mouth daily as needed for allergies. ) 30 tablet 0 Past Month at Unknown time  . aspirin EC 81 MG tablet Take 1 tablet (81 mg total) by mouth daily.   11/17/2017 at Unknown time  . blood glucose meter kit and supplies KIT Dispense based on patient and insurance preference. Use up to four times daily as directed. (FOR ICD-9 250.00, 250.01). 1 each 0 Taking  . Fluticasone-Salmeterol (ADVAIR DISKUS) 250-50 MCG/DOSE AEPB Inhale 1 puff into the lungs 2 (two) times daily. (Patient taking differently: Inhale 1 puff into the lungs 2 (two)  times daily as needed (wheezing). ) 1 each 3 Past Month at Unknown time  . insulin glargine (LANTUS) 100 UNIT/ML injection Inject 0.6 mLs (60 Units total) into the skin daily. 10 mL 11 11/17/2017 at Unknown time  . Insulin Pen Needle 32G X 6 MM MISC 1 Units by Does not apply route every morning. 90 each 1 Taking  . metoprolol tartrate (LOPRESSOR) 25 MG tablet TAKE 1 TABLET BY MOUTH TWICE A DAY (Patient taking differently: Take 25 mg by mouth 2 (two) times daily. ) 180 tablet 1 11/17/2017 at 1100  . mirtazapine (REMERON) 30 MG tablet Take 1 tablet (30 mg total) by mouth at bedtime. 30 tablet 2 11/16/2017 at Unknown time  . Multiple Vitamin (MULTIVITAMIN WITH MINERALS) TABS tablet Take 1 tablet by mouth daily.   11/17/2017 at Unknown time   . repaglinide (PRANDIN) 2 MG tablet Take 1 tablet (2 mg total) by mouth 3 (three) times daily before meals. (Patient taking differently: Take 2 mg by mouth 2 (two) times daily before a meal. ) 90 tablet 1 11/17/2017 at Unknown time  . risperiDONE (RISPERDAL) 2 MG tablet TAKE 1 TABLET IN THE MORNING AND TAKE 2 TABLETS AT BEDTIME (Patient taking differently: Take 2-4 mg by mouth See admin instructions. TAKE 1 TABLET IN THE MORNING AND TAKE 2 TABLETS AT BEDTIME) 90 tablet 2 11/17/2017 at Unknown time  . simvastatin (ZOCOR) 40 MG tablet TAKE 1 TABLET BY MOUTH EVERY DAY (Patient taking differently: Take 40 mg by mouth daily. ) 90 tablet 1 11/16/2017 at Unknown time  . traZODone (DESYREL) 100 MG tablet Take 1 tablet (100 mg total) by mouth at bedtime. 30 tablet 2 11/16/2017 at Unknown time  . Vitamin D, Ergocalciferol, (DRISDOL) 50000 units CAPS capsule Take 1 capsule by mouth once a week. On Monday  2 11/14/2017    Patient Stressors: Financial difficulties Health problems Medication change or noncompliance  Patient Strengths: Motivation for treatment/growth Supportive family/friends  Treatment Modalities: Medication Management, Group therapy, Case management,  1 to 1 session with clinician, Psychoeducation, Recreational therapy.   Physician Treatment Plan for Primary Diagnosis: Schizophrenia, paranoid type ([place]) Long Term Goal(s): Improvement in symptoms so as ready for discharge Improvement in symptoms so as ready for discharge   Short Term Goals: Ability to identify changes in lifestyle to reduce recurrence of condition will improve Ability to disclose and discuss suicidal ideas Ability to demonstrate self-control will improve Ability to identify and develop effective coping behaviors will improve Ability to identify changes in lifestyle to reduce recurrence of condition will improve Ability to verbalize feelings will improve Ability to disclose and discuss suicidal ideas Ability to demonstrate  self-control will improve  Medication Management: Evaluate patient's response, side effects, and tolerance of medication regimen.  Therapeutic Interventions: 1 to 1 sessions, Unit Group sessions and Medication administration.  Evaluation of Outcomes: Adequate for Discharge  Physician Treatment Plan for Secondary Diagnosis: Principal Problem:   Schizophrenia, paranoid type ([place])  Long Term Goal(s): Improvement in symptoms so as ready for discharge Improvement in symptoms so as ready for discharge   Short Term Goals: Ability to identify changes in lifestyle to reduce recurrence of condition will improve Ability to disclose and discuss suicidal ideas Ability to demonstrate self-control will improve Ability to identify and develop effective coping behaviors will improve Ability to identify changes in lifestyle to reduce recurrence of condition will improve Ability to verbalize feelings will improve Ability to disclose and discuss suicidal ideas Ability to demonstrate self-control will improve  Medication Management: Evaluate patient's response, side effects, and tolerance of medication regimen.  Therapeutic Interventions: 1 to 1 sessions, Unit Group sessions and Medication administration.  Evaluation of Outcomes: Adequate for Discharge   RN Treatment Plan for Primary Diagnosis: Schizophrenia, paranoid type ([place]) Long Term Goal(s): Knowledge of disease and therapeutic regimen to maintain health will improve  Short Term Goals: Ability to demonstrate self-control, Ability to participate in decision making will improve, Ability to verbalize feelings will improve, Ability to identify and develop effective coping behaviors will improve and Compliance with prescribed medications will improve  Medication Management: RN will administer medications as ordered by provider, will assess and evaluate patient's response and provide education to patient for prescribed medication. RN will report any  adverse and/or side effects to prescribing provider.  Therapeutic Interventions: 1 on 1 counseling sessions, Psychoeducation, Medication administration, Evaluate responses to treatment, Monitor vital signs and CBGs as ordered, Perform/monitor CIWA, COWS, AIMS and Fall Risk screenings as ordered, Perform wound care treatments as ordered.  Evaluation of Outcomes: Adequate for Discharge   LCSW Treatment Plan for Primary Diagnosis: Schizophrenia, paranoid type ([place]) Long Term Goal(s): Safe transition to appropriate next level of care at discharge, Engage patient in therapeutic group addressing interpersonal concerns.  Short Term Goals: Engage patient in aftercare planning with referrals and resources and Increase skills for wellness and recovery  Therapeutic Interventions: Assess for all discharge needs, 1 to 1 time with Social worker, Explore available resources and support systems, Assess for adequacy in community support network, Educate family and significant other(s) on suicide prevention, Complete Psychosocial Assessment, Interpersonal group therapy.  Evaluation of Outcomes: Not Met  Kendra Opitz coming to see pt this weekend about possibility of staying at her transitional living facility  9/18: Pt refused to see Ms Rosana Hoes on Sunday, saying he would return home or stay with uncle.  Family meeting with sister on Tuesday clarified that family is not able to take him in right now as mother is in hospital and uncle is not prepared to take him in.  Explained to pt that he needs to commit to a year, and he agreed.  Ms Rosana Hoes to come see today.   Progress in Treatment: Attending groups: Yes. Participating in groups: Yes. Taking medication as prescribed: Yes. Toleration medication: Yes. Family/Significant other contact made: Yes, individual(s) contacted:  patient's mother Patient understands diagnosis: Yes. Limited insight Discussing patient identified problems/goals with staff: Yes. Medical  problems stabilized or resolved: Yes. Denies suicidal/homicidal ideation: Yes. Issues/concerns per patient self-inventory: No. Other:   New problem(s) identified: None  New Short Term/Long Term Goal(s):medication stabilization, elimination of SI thoughts, development of comprehensive mental wellness plan.   Patient Goals:  Coping skills   Discharge Plan or Barriers: CSW will assess for appropriate referrals and discharge planning.   Reason for Continuation of Hospitalization: Hallucinations Medication stabilization Other; describe [place] behaviors  Estimated Length of Stay: 9/23  Attendees: Patient:  11/30/2017 9:28 AM  Physician: Dr. Maris Berger, MD 11/30/2017 9:28 AM  Nursing: Benjamine Mola.A, RN 11/30/2017 9:28 AM  RN Care Manager: Rhunette Croft 11/30/2017 9:28 AM  Social Worker: Radonna Ricker, Jaci Carrel [place], [place] 11/30/2017 9:28 AM  Recreational Therapist: X 11/30/2017 9:28 AM  Other: X 11/30/2017 9:28 AM  Other: X 11/30/2017 9:28 AM  Other:X 11/30/2017 9:28 AM    Scribe for Treatment Team: Trish Mage, LCSW 11/30/2017 9:28 AM

## 2017-11-30 NOTE — Progress Notes (Signed)
[facility] MD Progress Note  11/30/2017 1:36 PM [name] [name].  MRN:  [id number]  Subjective:  [name] [name] reports, "I'm doing pretty good. My mood is pretty good. I just have a cold, that is all".  HistoryPer initial assessment:-[name] Vaughnis an 52 y.[facility] accompanied by his mother with complaints of bizarre behavior and auditory hallucinations. Per report from patient's mother she feels patient is having a psychotic break. Patient is a poor historian and speaks in a tangential language and appears to be delusional. Patient also displayed thought blocking. Patient denies suicidal / homicidal ideations and denies experiencing auditory / visual hallucinations. Patient report he taking his medication as prescribed but patient's mother feels patient is not taking his medication. Patient was last seen in the ED 08/09/2017 for bizarre behavior and last seen for an assessment 3.28.2019. Patient psychiatrist is Dr. Adele Schilder.  Today upon evaluation: Ptshares, "I'm doing good".  He denies any specific concerns today. He is sleeping well. His appetite is good. He denies other physical complaints. He denies SI/HI/AH/VH. He is tolerating his medications well and he is in agreement to continue his current regimen without changes.He continues to state that he plans to stay with his uncle after discharge, and he sister came by 2 evenings ago to discuss with him that will not be an immediate option. The attending psychiatrist had discussed with patient that we could seek group home placement if he would be willing to commit to staying at one location for at least 12 months, and with encouragement from his sister via telephone pt was in agreement for referral to group home.  He had no further questions, comments, or concerns.  Principal Problem: Schizophrenia, paranoid type ([place]) Diagnosis:   Patient Active Problem List   Diagnosis Date Noted  . Schizophrenia, paranoid type  ([place]) [F20.0] 11/18/2017  . DKA (diabetic ketoacidoses) ([place]) [E13.10] 11/05/2017  . Acute renal failure superimposed on stage 3 chronic kidney disease ([place]) [N17.9, N18.3] 11/05/2017  . Type II diabetes mellitus with renal manifestations ([place]) [E11.29] 11/05/2017  . Depression [F32.9] 11/05/2017  . Leukocytosis [D72.829] 11/05/2017  . Cough [R05] 11/05/2017  . Tobacco abuse [Z72.0] 11/05/2017  . Schizoaffective disorder, bipolar type ([place]) [F25.0] 06/10/2016  . DM (diabetes mellitus) type II uncontrolled, periph vascular disorder ([place]) [E11.51, E11.65] 02/29/2016  . Schizophrenia, disorganized type ([place]) [F20.1] 06/02/2015  . Hyperlipidemia [E78.5] 09/23/2014  . Renal insufficiency [N28.9] 09/23/2014  . HTN (hypertension) [I10] 03/22/2013  . Seasonal allergies [J30.2] 03/22/2013   Total Time spent with patient: 15 minutes  Past Psychiatric History: See H&P  Past Medical History:  Past Medical History:  Diagnosis Date  . Diabetes mellitus type II, uncontrolled ([place])   . HTN (hypertension)   . Hyperlipidemia LDL goal <70   . Schizophrenia ([place])    History reviewed. No pertinent surgical history. Family History:  Family History  Problem Relation Age of Onset  . Breast cancer Mother   . Diabetes Mother   . Prostate cancer Father        StepFather  . Hypertension Father   . Breast cancer Maternal Aunt   . Diabetes Maternal Uncle    Family Psychiatric  History: See H&P  Social History:  Social History   Substance and Sexual Activity  Alcohol Use Yes   Comment: occasional     Social History   Substance and Sexual Activity  Drug Use Yes  . Types: Marijuana    Social History   Socioeconomic History  .  Marital status: Single    Spouse name: Not on file  . Number of children: Not on file  . Years of education: Not on file  . Highest education level: Not on file  Occupational History  . Not on file  Social Needs  . Financial resource strain: Not on file  . Food  insecurity:    Worry: Not on file    Inability: Not on file  . Transportation needs:    Medical: Not on file    Non-medical: Not on file  Tobacco Use  . Smoking status: Current Every Day Smoker    Packs/day: 2.00    Years: 30.00    Pack years: 60.00    Types: Cigarettes, Cigars  . Smokeless tobacco: Never Used  Substance and Sexual Activity  . Alcohol use: Yes    Comment: occasional  . Drug use: Yes    Types: Marijuana  . Sexual activity: Never  Lifestyle  . Physical activity:    Days per week: Not on file    Minutes per session: Not on file  . Stress: Not on file  Relationships  . Social connections:    Talks on phone: Not on file    Gets together: Not on file    Attends religious service: Not on file    Active member of club or organization: Not on file    Attends meetings of clubs or organizations: Not on file    Relationship status: Not on file  Other Topics Concern  . Not on file  Social History Narrative   Lives with mom   unemployed   No children   Additional Social History:   Sleep: Good  Appetite:  Good  Current Medications: Current Facility-Administered Medications  Medication Dose Route Frequency Provider Last Rate Last Dose  . acetaminophen (TYLENOL) tablet 650 mg  650 mg Oral Q6H PRN Ethelene Hal, NP   650 mg at 11/30/17 0816  . alum & mag hydroxide-simeth (MAALOX/MYLANTA) 200-200-20 MG/5ML suspension 30 mL  30 mL Oral Q4H PRN Ethelene Hal, NP      . hydrOXYzine (ATARAX/VISTARIL) tablet 25 mg  25 mg Oral TID PRN Ethelene Hal, NP   25 mg at 11/19/17 2132  . insulin aspart (novoLOG) injection 0-9 Units  0-9 Units Subcutaneous TID WC Ethelene Hal, NP   2 Units at 11/30/17 1210  . insulin glargine (LANTUS) injection 60 Units  60 Units Subcutaneous Daily Ethelene Hal, NP   60 Units at 11/30/17 [phone]  . loratadine (CLARITIN) tablet 10 mg  10 mg Oral Daily Ethelene Hal, NP   10 mg at 11/30/17 0755  .  magnesium hydroxide (MILK OF MAGNESIA) suspension 30 mL  30 mL Oral Daily PRN Ethelene Hal, NP      . metoprolol tartrate (LOPRESSOR) tablet 25 mg  25 mg Oral BID Ethelene Hal, NP   25 mg at 11/30/17 0755  . mirtazapine (REMERON) tablet 7.5 mg  7.5 mg Oral QHS Cobos, Myer Peer, MD   7.5 mg at 11/29/17 2159  . mometasone-formoterol (DULERA) 200-5 MCG/ACT inhaler 2 puff  2 puff Inhalation BID Ethelene Hal, NP   2 puff at 11/30/17 0755  . nicotine polacrilex (NICORETTE) gum 2 mg  2 mg Oral PRN Derrill Center, NP   2 mg at 11/29/17 1817  . repaglinide (PRANDIN) tablet 2 mg  2 mg Oral BID AC Ethelene Hal, NP   2 mg at 11/30/17 8676  .  risperiDONE (RISPERDAL) tablet 3 mg  3 mg Oral QHS Derrill Center, NP   3 mg at 11/29/17 2159   And  . risperiDONE (RISPERDAL) tablet 1 mg  1 mg Oral Q breakfast Derrill Center, NP   1 mg at 11/30/17 0755  . simvastatin (ZOCOR) tablet 40 mg  40 mg Oral QHS Ethelene Hal, NP   40 mg at 11/29/17 2159  . traZODone (DESYREL) tablet 50 mg  50 mg Oral QHS PRN Ethelene Hal, NP   50 mg at 11/19/17 2132  . Vitamin D (Ergocalciferol) (DRISDOL) capsule 50,000 Units  50,000 Units Oral Weekly Ethelene Hal, NP   50,000 Units at 11/28/17 0831   Lab Results:  Results for orders placed or performed during the hospital encounter of 11/18/17 (from the past 48 hour(s))  Glucose, capillary     Status: Abnormal   Collection Time: 11/28/17  5:09 PM  Result Value Ref Range   Glucose-Capillary 206 (H) 70 - 99 mg/dL  Glucose, capillary     Status: Abnormal   Collection Time: 11/28/17  8:55 PM  Result Value Ref Range   Glucose-Capillary 256 (H) 70 - 99 mg/dL  Glucose, capillary     Status: Abnormal   Collection Time: 11/29/17  6:02 AM  Result Value Ref Range   Glucose-Capillary 178 (H) 70 - 99 mg/dL  Glucose, capillary     Status: Abnormal   Collection Time: 11/29/17 12:10 PM  Result Value Ref Range   Glucose-Capillary 122  (H) 70 - 99 mg/dL  Glucose, capillary     Status: Abnormal   Collection Time: 11/29/17  4:40 PM  Result Value Ref Range   Glucose-Capillary 237 (H) 70 - 99 mg/dL   Comment 1 Notify RN    Comment 2 Document in Chart   Glucose, capillary     Status: Abnormal   Collection Time: 11/29/17  8:30 PM  Result Value Ref Range   Glucose-Capillary 310 (H) 70 - 99 mg/dL   Comment 1 Notify RN   Glucose, capillary     Status: Abnormal   Collection Time: 11/30/17  6:02 AM  Result Value Ref Range   Glucose-Capillary 145 (H) 70 - 99 mg/dL  Glucose, capillary     Status: Abnormal   Collection Time: 11/30/17 12:04 PM  Result Value Ref Range   Glucose-Capillary 192 (H) 70 - 99 mg/dL   Blood Alcohol level:  Lab Results  Component Value Date   ETH <10 11/17/2017   ETH <10 89/21/1941   Metabolic Disorder Labs: Lab Results  Component Value Date   HGBA1C 10.8 (H) 11/20/2017   MPG 263.26 11/20/2017   MPG 278 11/06/2017   Lab Results  Component Value Date   PROLACTIN 63.7 (H) 11/20/2017   Lab Results  Component Value Date   CHOL 116 11/20/2017   TRIG 108 11/20/2017   HDL 41 11/20/2017   CHOLHDL 2.8 11/20/2017   VLDL 22 11/20/2017   LDLCALC 53 11/20/2017   LDLCALC 66 09/30/2017   Physical Findings: AIMS: Facial and Oral Movements Muscles of Facial Expression: None, normal Lips and Perioral Area: None, normal Jaw: None, normal Tongue: None, normal,Extremity Movements Upper (arms, wrists, hands, fingers): None, normal Lower (legs, knees, ankles, toes): None, normal, Trunk Movements Neck, shoulders, hips: None, normal, Overall Severity Severity of abnormal movements (highest score from questions above): None, normal Incapacitation due to abnormal movements: None, normal Patient's awareness of abnormal movements (rate only patient's report): No Awareness, Dental Status Current  problems with teeth and/or dentures?: No Does patient usually wear dentures?: No  CIWA:    COWS:      Musculoskeletal: Strength & Muscle Tone: within normal limits Gait & Station: normal Patient leans: N/A  Psychiatric Specialty Exam: Physical Exam  Nursing note and vitals reviewed.   Review of Systems  Constitutional: Negative for chills and fever.  Respiratory: Negative for cough and shortness of breath.   Cardiovascular: Negative for chest pain.  Gastrointestinal: Negative for abdominal pain, heartburn, nausea and vomiting.  Psychiatric/Behavioral: Negative for depression, hallucinations and suicidal ideas. The patient is not nervous/anxious and does not have insomnia.     Blood pressure 131/74, pulse (!) 121, temperature 99.5 F (37.5 C), temperature source Oral, resp. rate 20, height 5\' 10"  (1.778 m), weight 108 kg, SpO2 98 %.Body mass index is 34.15 kg/m.  General Appearance: Casual and Disheveled  Eye Contact:  Good  Speech:  Clear and Coherent and Normal Rate  Volume:  Normal  Mood:  Euthymic  Affect:  Blunt and Flat  Thought Process:  Coherent and Goal Directed  Orientation:  Full (Time, Place, and Person)  Thought Content:  Logical  Suicidal Thoughts:  No  Homicidal Thoughts:  No  Memory:  Immediate;   Fair Recent;   Fair Remote;   Fair  Judgement:  Poor  Insight:  Lacking  Psychomotor Activity:  Normal  Concentration:  Concentration: Fair  Recall:  [name] of Knowledge:  Fair  Language:  Fair  Akathisia:  No  Handed:    AIMS (if indicated):     Assets:  Resilience Social Support  ADL's:  Intact  Cognition:  WNL  Sleep:  Number of Hours: 6.25   Treatment Plan Summary: Daily contact with patient to assess and evaluate symptoms and progress in treatment and Medication management   - Continue inpatient hospitalization.  -Will continue today 11/30/2017 plan as below except where it is noted. -Schizophrenia -Continue Risperdal 1mg  po qAM +3 mg po qhs  Insomnia -Continue with Trazodone 50mg  qhs prn  insomnia -Continue remeron 15mg  po qhs  -HLD -Continue simvastatin 40mg  po qDay  -COPD -Continue dulera 200-5 mcg/act take 2 puffs BID  -HTN -Continue lopressor 25mg  po BID  -seasonal allergy -Continue claritin 10mg  po qDay  -DMII -Continue current SSI orders with lantus and novolog -Continue repalinide 2mg  po BID  -anxiety -Continue vistaril 25mg  po TID prn anxiety  Will continue to monitor vitals ,medication compliance and treatment side effects while patient is here.   CSW willcontinueworking on disposition.  Patient to participate in therapeutic milieu  Lindell Spar, NP 11/30/2017, 1:36 PMPatient ID: [name] [name]., male   DOB: May 14, 1965, 52 y.o.   MRN: [id number]

## 2017-11-30 NOTE — [facility] Group Notes (Signed)
LCSW Group Therapy Note  11/30/2017 1:15pm  Type of Therapy and Topic:  Group Therapy:  Feelings around Relapse and Recovery  Participation Level:  Did Not Attend   Description of Group:    Patients in this group will discuss emotions they experience before and after a relapse. They will process how experiencing these feelings, or avoidance of experiencing them, relates to having a relapse. Facilitator will guide patients to explore emotions they have related to recovery. Patients will be encouraged to process which emotions are more powerful. They will be guided to discuss the emotional reaction significant others in their lives may have to their relapse or recovery. Patients will be assisted in exploring ways to respond to the emotions of others without this contributing to a relapse.  Therapeutic Goals: 1. Patient will identify two or more emotions that lead to a relapse for them 2. Patient will identify two emotions that result when they relapse 3. Patient will identify two emotions related to recovery 4. Patient will demonstrate ability to communicate their needs through discussion and/or role plays   Summary of Patient Progress:  [name] [name] did not attend group.   Therapeutic Modalities:   Cognitive Behavioral Therapy Solution-Focused Therapy Assertiveness Training Relapse Prevention Therapy   Trish Mage, LCSW 11/30/2017 2:04 PM

## 2017-11-30 NOTE — Progress Notes (Signed)
Adult Psychoeducational Group Note  Date:  11/30/2017 Time:  8:43 PM  Group Topic/Focus:  Wrap-Up Group:   The focus of this group is to help patients review their daily goal of treatment and discuss progress on daily workbooks.  Participation Level:  Active  Participation Quality:  Appropriate  Affect:  Appropriate  Cognitive:  Appropriate  Insight: Appropriate  Engagement in Group:  Engaged  Modes of Intervention:  Discussion  Additional Comments:  Patient attended group and said that his day was a 6.  Patient said he was sick most of the day, but is felling a little better this evening. He basically slept most of the day.   Bryauna Byrum W Glora Hulgan 09/20/6281, 8:43 PM

## 2017-11-30 NOTE — Progress Notes (Signed)
Recreation Therapy Notes  Date: 9.16.19 Time: 1000 Location: 500 Hall Dayroom  Group Topic: Coping Skills  Goal Area(s) Addresses:  Patient will be able to identify positive coping skills. Patient will be able to identify benefits of using coping skills post d/c.  Behavioral Response:  Minimal  Intervention: Worksheet, pencils  Activity: Building surveyor.  Patients were to identify the things, people, situations they feel are holding them back and write them inside the web.  Patients were to then identify their coping skills and place them on the outside of the web.  Education: Radiographer, therapeutic, Dentist.   Education Outcome: Acknowledges understanding/In group clarification offered/Needs additional education.   Clinical Observations/Feedback: Pt stated his main struggle was unmet needs.  Pt stated his coping skills for his unmet needs were attaining necessities and seeking insight.    Victorino Sparrow, LRT/CTRS     Victorino Sparrow A 11/30/2017 12:01 PM

## 2017-11-30 NOTE — Progress Notes (Signed)
Nursing Progress Note: 7p-7a D: Pt currently presents with a pleasant/blocking affect and behavior. Pt states "I was feeling bad. Bad cold from last night." Interacting appropriately with the milieu. Pt reports good sleep during the previous night with current medication regimen. Pt did attend wrap-up group.  A: Pt provided with medications per providers orders. Pt's labs and vitals were monitored throughout the night. Pt supported emotionally and encouraged to express concerns and questions. Pt educated on medications.  R: Pt's safety ensured with 15 minute and environmental checks. Pt currently denies SI, HI, and AVH. Pt verbally contracts to seek staff if SI,HI, or AVH occurs and to consult with staff before acting on any harmful thoughts. Will continue to monitor.

## 2017-11-30 NOTE — Progress Notes (Signed)
Patient attended group and participated

## 2017-12-01 LAB — GLUCOSE, CAPILLARY
GLUCOSE-CAPILLARY: 237 mg/dL — AB (ref 70–99)
GLUCOSE-CAPILLARY: 281 mg/dL — AB (ref 70–99)
Glucose-Capillary: 114 mg/dL — ABNORMAL HIGH (ref 70–99)
Glucose-Capillary: 125 mg/dL — ABNORMAL HIGH (ref 70–99)

## 2017-12-01 NOTE — Progress Notes (Signed)
Patient has had decreased disorganization and intrusive behaviors. Patient denies SI, HI and AVH. Patient has attended groups and participated in unit activities.   Assess patient for safety offer medications as prescribed engage patient in 1:1 staff talks.   Continue to monitor as planned. Patient able to contract for safety

## 2017-12-01 NOTE — Progress Notes (Signed)
Patient stated in group that he anticipates being discharged tomorrow. He is unclear about his discharge plans.His goal for tomorrow is to go out and smoke once he is discharged.

## 2017-12-01 NOTE — [facility] Group Notes (Signed)
[facility] Group Notes:  (Nursing/MHT/Case Management/Adjunct)  Date:  12/01/2017  Time:  4:46 PM  Type of Therapy:  Psychoeducational Skills  Participation Level:  Did Not Attend   Coralyn Mark Yarlin Breisch 12/01/2017, 4:46 PM

## 2017-12-01 NOTE — Progress Notes (Signed)
Nursing Progress Note: 7p-7a D: Pt currently presents with a pleasant/cooperative/blocking affect and behavior. Interacting appropriately with the milieu. Pt reports fair sleep during the previous night with current medication regimen. Pt did attend wrap-up group.  A: Pt provided with medications per providers orders. Pt's labs and vitals were monitored throughout the night. Pt supported emotionally and encouraged to express concerns and questions. Pt educated on medications.  R: Pt's safety ensured with 15 minute and environmental checks. Pt currently denies SI, HI, and AVH. Pt verbally contracts to seek staff if SI,HI, or AVH occurs and to consult with staff before acting on any harmful thoughts. Will continue to monitor.

## 2017-12-01 NOTE — [facility] Group Notes (Signed)
LCSW Group Therapy Note  12/01/2017 1:15pm  Type of Therapy/Topic:  Group Therapy:  Balance in Life  Participation Level:  Did Not Attend  Description of Group:    This group will address the concept of balance and how it feels and looks when one is unbalanced. Patients will be encouraged to process areas in their lives that are out of balance and identify reasons for remaining unbalanced. Facilitators will guide patients in utilizing problem-solving interventions to address and correct the stressor making their life unbalanced. Understanding and applying boundaries will be explored and addressed for obtaining and maintaining a balanced life. Patients will be encouraged to explore ways to assertively make their unbalanced needs known to significant others in their lives, using other group members and facilitator for support and feedback.  Therapeutic Goals: 1. Patient will identify two or more emotions or situations they have that consume much of in their lives. 2. Patient will identify signs/triggers that life has become out of balance:  3. Patient will identify two ways to set boundaries in order to achieve balance in their lives:  4. Patient will demonstrate ability to communicate their needs through discussion and/or role plays  Summary of Patient Progress:      Therapeutic Modalities:   Cognitive [place] Training  Trish Mage, LCSW 12/01/2017 2:25 PM

## 2017-12-01 NOTE — Progress Notes (Signed)
[facility] MD Progress Note  12/01/2017 1:13 PM [name] [name].  MRN:  [id number] Subjective:    HistoryPer initial assessment:-[name] Vaughnis an 52 y.o[facility] accompanied by his mother with complaints of bizarre behavior and auditory hallucinations. Per report from patient's mother she feels patient is having a psychotic break. Patient is a poor historian and speaks in a tangential language and appears to be delusional. Patient also displayed thought blocking. Patient denies suicidal / homicidal ideations and denies experiencing auditory / visual hallucinations. Patient report he taking his medication as prescribed but patient's mother feels patient is not taking his medication. Patient was last seen in the ED 08/09/2017 for bizarre behavior and last seen for an assessment 3.28.2019. Patient psychiatrist is Dr. Adele Schilder.  Today upon evaluation: Ptshares, "I'm good. I heard that I got accepted." Pt shares that he interviewed for placement at group living home and he was accepted to arrive as soon as the room can be made available in the coming days. He is excited about this opportunity and he thinks it will be a good fit. He denies any specific concerns today aside from some mild sinus congestion. He is sleeping well. His appetite is good. He denies other physical complaints. He denies SI/HI/AH/VH. He is tolerating his medications well and he is in agreement to continue his current regimen without changes. He had no further questions, comments, or concerns.  Principal Problem: Schizophrenia, paranoid type ([place]) Diagnosis:   Patient Active Problem List   Diagnosis Date Noted  . Schizophrenia, paranoid type ([place]) [F20.0] 11/18/2017  . DKA (diabetic ketoacidoses) ([place]) [E13.10] 11/05/2017  . Acute renal failure superimposed on stage 3 chronic kidney disease ([place]) [N17.9, N18.3] 11/05/2017  . Type II diabetes mellitus with renal manifestations ([place]) [E11.29]  11/05/2017  . Depression [F32.9] 11/05/2017  . Leukocytosis [D72.829] 11/05/2017  . Cough [R05] 11/05/2017  . Tobacco abuse [Z72.0] 11/05/2017  . Schizoaffective disorder, bipolar type ([place]) [F25.0] 06/10/2016  . DM (diabetes mellitus) type II uncontrolled, periph vascular disorder ([place]) [E11.51, E11.65] 02/29/2016  . Schizophrenia, disorganized type ([place]) [F20.1] 06/02/2015  . Hyperlipidemia [E78.5] 09/23/2014  . Renal insufficiency [N28.9] 09/23/2014  . HTN (hypertension) [I10] 03/22/2013  . Seasonal allergies [J30.2] 03/22/2013   Total Time spent with patient: 30 minutes  Past Psychiatric History: see H&P  Past Medical History:  Past Medical History:  Diagnosis Date  . Diabetes mellitus type II, uncontrolled ([place])   . HTN (hypertension)   . Hyperlipidemia LDL goal <70   . Schizophrenia ([place])    History reviewed. No pertinent surgical history. Family History:  Family History  Problem Relation Age of Onset  . Breast cancer Mother   . Diabetes Mother   . Prostate cancer Father        StepFather  . Hypertension Father   . Breast cancer Maternal Aunt   . Diabetes Maternal Uncle    Family Psychiatric  History: see H&P Social History:  Social History   Substance and Sexual Activity  Alcohol Use Yes   Comment: occasional     Social History   Substance and Sexual Activity  Drug Use Yes  . Types: Marijuana    Social History   Socioeconomic History  . Marital status: Single    Spouse name: Not on file  . Number of children: Not on file  . Years of education: Not on file  . Highest education level: Not on file  Occupational History  . Not on file  Social  Needs  . Financial resource strain: Not on file  . Food insecurity:    Worry: Not on file    Inability: Not on file  . Transportation needs:    Medical: Not on file    Non-medical: Not on file  Tobacco Use  . Smoking status: Current Every Day Smoker    Packs/day: 2.00    Years: 30.00    Pack years: 60.00     Types: Cigarettes, Cigars  . Smokeless tobacco: Never Used  Substance and Sexual Activity  . Alcohol use: Yes    Comment: occasional  . Drug use: Yes    Types: Marijuana  . Sexual activity: Never  Lifestyle  . Physical activity:    Days per week: Not on file    Minutes per session: Not on file  . Stress: Not on file  Relationships  . Social connections:    Talks on phone: Not on file    Gets together: Not on file    Attends religious service: Not on file    Active member of club or organization: Not on file    Attends meetings of clubs or organizations: Not on file    Relationship status: Not on file  Other Topics Concern  . Not on file  Social History Narrative   Lives with mom   unemployed   No children   Additional Social History:                         Sleep: Good  Appetite:  Good  Current Medications: Current Facility-Administered Medications  Medication Dose Route Frequency Provider Last Rate Last Dose  . acetaminophen (TYLENOL) tablet 650 mg  650 mg Oral Q6H PRN Ethelene Hal, NP   650 mg at 11/30/17 0816  . alum & mag hydroxide-simeth (MAALOX/MYLANTA) 200-200-20 MG/5ML suspension 30 mL  30 mL Oral Q4H PRN Ethelene Hal, NP      . hydrOXYzine (ATARAX/VISTARIL) tablet 25 mg  25 mg Oral TID PRN Ethelene Hal, NP   25 mg at 11/19/17 2132  . insulin aspart (novoLOG) injection 0-9 Units  0-9 Units Subcutaneous TID WC Ethelene Hal, NP   7 Units at 11/30/17 1730  . insulin glargine (LANTUS) injection 60 Units  60 Units Subcutaneous Daily Ethelene Hal, NP   60 Units at 12/01/17 0727  . loratadine (CLARITIN) tablet 10 mg  10 mg Oral Daily Ethelene Hal, NP   10 mg at 12/01/17 0727  . magnesium hydroxide (MILK OF MAGNESIA) suspension 30 mL  30 mL Oral Daily PRN Ethelene Hal, NP      . metoprolol tartrate (LOPRESSOR) tablet 25 mg  25 mg Oral BID Ethelene Hal, NP   25 mg at 12/01/17 9798  .  mirtazapine (REMERON) tablet 7.5 mg  7.5 mg Oral QHS Cobos, Myer Peer, MD   7.5 mg at 11/30/17 2205  . mometasone-formoterol (DULERA) 200-5 MCG/ACT inhaler 2 puff  2 puff Inhalation BID Ethelene Hal, NP   2 puff at 12/01/17 0726  . nicotine polacrilex (NICORETTE) gum 2 mg  2 mg Oral PRN Derrill Center, NP   2 mg at 11/30/17 1607  . repaglinide (PRANDIN) tablet 2 mg  2 mg Oral BID AC Ethelene Hal, NP   2 mg at 12/01/17 0727  . risperiDONE (RISPERDAL) tablet 3 mg  3 mg Oral QHS Derrill Center, NP   3 mg at 11/30/17 2205  And  . risperiDONE (RISPERDAL) tablet 1 mg  1 mg Oral Q breakfast Derrill Center, NP   1 mg at 12/01/17 0727  . simvastatin (ZOCOR) tablet 40 mg  40 mg Oral QHS Ethelene Hal, NP   40 mg at 11/30/17 2205  . traZODone (DESYREL) tablet 50 mg  50 mg Oral QHS PRN Ethelene Hal, NP   50 mg at 11/19/17 2132  . Vitamin D (Ergocalciferol) (DRISDOL) capsule 50,000 Units  50,000 Units Oral Weekly Ethelene Hal, NP   50,000 Units at 11/28/17 0831    Lab Results:  Results for orders placed or performed during the hospital encounter of 11/18/17 (from the past 48 hour(s))  Glucose, capillary     Status: Abnormal   Collection Time: 11/29/17  4:40 PM  Result Value Ref Range   Glucose-Capillary 237 (H) 70 - 99 mg/dL   Comment 1 Notify RN    Comment 2 Document in Chart   Glucose, capillary     Status: Abnormal   Collection Time: 11/29/17  8:30 PM  Result Value Ref Range   Glucose-Capillary 310 (H) 70 - 99 mg/dL   Comment 1 Notify RN   Glucose, capillary     Status: Abnormal   Collection Time: 11/30/17  6:02 AM  Result Value Ref Range   Glucose-Capillary 145 (H) 70 - 99 mg/dL  Glucose, capillary     Status: Abnormal   Collection Time: 11/30/17 12:04 PM  Result Value Ref Range   Glucose-Capillary 192 (H) 70 - 99 mg/dL  Glucose, capillary     Status: Abnormal   Collection Time: 11/30/17  4:57 PM  Result Value Ref Range   Glucose-Capillary  311 (H) 70 - 99 mg/dL  Glucose, capillary     Status: Abnormal   Collection Time: 11/30/17  8:51 PM  Result Value Ref Range   Glucose-Capillary 216 (H) 70 - 99 mg/dL  Glucose, capillary     Status: Abnormal   Collection Time: 12/01/17  6:23 AM  Result Value Ref Range   Glucose-Capillary 114 (H) 70 - 99 mg/dL  Glucose, capillary     Status: Abnormal   Collection Time: 12/01/17 12:00 PM  Result Value Ref Range   Glucose-Capillary 125 (H) 70 - 99 mg/dL    Blood Alcohol level:  Lab Results  Component Value Date   ETH <10 11/17/2017   ETH <10 05/39/7673    Metabolic Disorder Labs: Lab Results  Component Value Date   HGBA1C 10.8 (H) 11/20/2017   MPG 263.26 11/20/2017   MPG 278 11/06/2017   Lab Results  Component Value Date   PROLACTIN 63.7 (H) 11/20/2017   Lab Results  Component Value Date   CHOL 116 11/20/2017   TRIG 108 11/20/2017   HDL 41 11/20/2017   CHOLHDL 2.8 11/20/2017   VLDL 22 11/20/2017   LDLCALC 53 11/20/2017   LDLCALC 66 09/30/2017    Physical Findings: AIMS: Facial and Oral Movements Muscles of Facial Expression: None, normal Lips and Perioral Area: None, normal Jaw: None, normal Tongue: None, normal,Extremity Movements Upper (arms, wrists, hands, fingers): None, normal Lower (legs, knees, ankles, toes): None, normal, Trunk Movements Neck, shoulders, hips: None, normal, Overall Severity Severity of abnormal movements (highest score from questions above): None, normal Incapacitation due to abnormal movements: None, normal Patient's awareness of abnormal movements (rate only patient's report): No Awareness, Dental Status Current problems with teeth and/or dentures?: No Does patient usually wear dentures?: No  CIWA:    COWS:  Musculoskeletal: Strength & Muscle Tone: within normal limits Gait & Station: normal Patient leans: N/A  Psychiatric Specialty Exam: Physical Exam  Nursing note and vitals reviewed.   Review of Systems   Constitutional: Negative for chills and fever.  Respiratory: Negative for cough and shortness of breath.   Cardiovascular: Negative for chest pain.  Gastrointestinal: Negative for abdominal pain, heartburn, nausea and vomiting.  Psychiatric/Behavioral: Negative for depression, hallucinations and suicidal ideas. The patient is not nervous/anxious and does not have insomnia.     Blood pressure 117/82, pulse (!) 104, temperature 99.2 F (37.3 C), temperature source Oral, resp. rate 20, height 5\' 10"  (1.778 m), weight 108 kg, SpO2 98 %.Body mass index is 34.15 kg/m.  General Appearance: Casual, Disheveled and Fairly Groomed  Eye Contact:  Good  Speech:  Clear and Coherent and Normal Rate  Volume:  Normal  Mood:  Euthymic  Affect:  Appropriate, Congruent and Flat  Thought Process:  Coherent and Goal Directed  Orientation:  Full (Time, Place, and Person)  Thought Content:  Logical  Suicidal Thoughts:  No  Homicidal Thoughts:  No  Memory:  Immediate;   Fair Recent;   Fair Remote;   Fair  Judgement:  Fair  Insight:  Lacking  Psychomotor Activity:  Normal  Concentration:  Concentration: Fair  Recall:  [name] of Knowledge:  Fair  Language:  Fair  Akathisia:  No  Handed:    AIMS (if indicated):     Assets:  Resilience Social Support  ADL's:  Intact  Cognition:  WNL  Sleep:  Number of Hours: 5.75     Treatment Plan Summary: Daily contact with patient to assess and evaluate symptoms and progress in treatment and Medication management   - Continue inpatient hospitalization.  -Will continue today9/19/2019plan as below except where it is noted.  -Schizophrenia -Continue Risperdal 1mg  po qAM +3 mg po qhs  Insomnia -Continue with Trazodone 50mg  qhs prn insomnia -Continue remeron 15mg  po qhs  -HLD -Continue simvastatin 40mg  po qDay  -COPD -Continue dulera 200-5 mcg/act take 2  puffs BID  -HTN -Continue lopressor 25mg  po BID  -seasonal allergy -Continue claritin 10mg  po qDay  -DMII -Continue current SSI orders with lantus and novolog -Continue repalinide 2mg  po BID  -anxiety -Continue vistaril 25mg  po TID prn anxiety  Will continue to monitor vitals ,medication compliance and treatment side effects while patient is here.   CSW willcontinueworking on disposition.  Patient to participate in therapeutic milieu  Pennelope Bracken, MD 12/01/2017, 1:13 PM

## 2017-12-01 NOTE — Progress Notes (Signed)
Recreation Therapy Notes  Date: 9.19.19 Time: 1000 Location: 500 Hall Dayroom   Group Topic: Communication, Team Building, Problem Solving  Goal Area(s) Addresses:  Patient will effectively work with peer towards shared goal.  Patient will identify skill used to make activity successful.  Patient will identify how skills used during activity can be used to reach post d/c goals.   Behavioral Response: None  Intervention: STEM Activity   Activity: [employer]. Patients were provided the following materials: 5 drinking straws, 5 rubber bands, 5 paper clips, 2 index cards, 2 drinking cups, and 2 toilet paper rolls. Using the provided materials patients were asked to build a launching mechanisms to launch a ping pong ball approximately 12 feet. Patients were divided into teams of 3-5.   Education:Social Skills, Discharge Planning.   Education Outcome: Acknowledges education/In group clarification offered/Needs additional education.   Clinical Observations/Feedback: Pt arrived near the end of the group.  Pt sat and watched his peers complete the activity.    Victorino Sparrow, LRT/CTRS    Victorino Sparrow A 12/01/2017 11:42 AM

## 2017-12-02 ENCOUNTER — Telehealth: Payer: Self-pay | Admitting: *Deleted

## 2017-12-02 LAB — GLUCOSE, CAPILLARY: GLUCOSE-CAPILLARY: 131 mg/dL — AB (ref 70–99)

## 2017-12-02 MED ORDER — RISPERIDONE 3 MG PO TABS: 3.0000 mg | ORAL_TABLET | Freq: Every day | ORAL | 0 refills | Status: DC

## 2017-12-02 MED ORDER — MIRTAZAPINE 7.5 MG PO TABS: 7.5000 mg | ORAL_TABLET | Freq: Every day | ORAL | 0 refills | Status: DC

## 2017-12-02 MED ORDER — RISPERIDONE 1 MG PO TABS: 1.0000 mg | ORAL_TABLET | Freq: Every day | ORAL | 0 refills | Status: DC

## 2017-12-02 MED ORDER — REPAGLINIDE 2 MG PO TABS: 2.0000 mg | ORAL_TABLET | Freq: Two times a day (BID) | ORAL | 0 refills | Status: DC

## 2017-12-02 MED ORDER — METOPROLOL TARTRATE 25 MG PO TABS: 25.0000 mg | ORAL_TABLET | Freq: Two times a day (BID) | ORAL | 0 refills | Status: DC

## 2017-12-02 MED ORDER — INSULIN GLARGINE 100 UNIT/ML ~~LOC~~ SOLN: 60.0000 [IU] | Freq: Every day | SUBCUTANEOUS | 11 refills | Status: DC

## 2017-12-02 MED ORDER — HYDROXYZINE HCL 25 MG PO TABS: 25.0000 mg | ORAL_TABLET | Freq: Three times a day (TID) | ORAL | 0 refills | Status: DC | PRN

## 2017-12-02 MED ORDER — MOMETASONE FURO-FORMOTEROL FUM 200-5 MCG/ACT IN AERO: 2.0000 | INHALATION_SPRAY | Freq: Two times a day (BID) | RESPIRATORY_TRACT | Status: DC

## 2017-12-02 MED ORDER — NICOTINE POLACRILEX 2 MG MT GUM: 2.0000 mg | CHEWING_GUM | OROMUCOSAL | 0 refills | Status: DC | PRN

## 2017-12-02 MED ORDER — ASPIRIN EC 81 MG PO TBEC: 81.0000 mg | DELAYED_RELEASE_TABLET | Freq: Every day | ORAL | 0 refills | Status: AC

## 2017-12-02 MED ORDER — VITAMIN D (ERGOCALCIFEROL) 1.25 MG (50000 UNIT) PO CAPS: 50000.0000 [IU] | ORAL_CAPSULE | ORAL | 0 refills | Status: AC

## 2017-12-02 MED ORDER — TRAZODONE HCL 50 MG PO TABS: 50.0000 mg | ORAL_TABLET | Freq: Every evening | ORAL | 0 refills | Status: DC | PRN

## 2017-12-02 MED ORDER — SIMVASTATIN 40 MG PO TABS: 40.0000 mg | ORAL_TABLET | Freq: Every day | ORAL | 0 refills | Status: DC

## 2017-12-02 NOTE — Progress Notes (Signed)
  [facility] Adult Case Management Discharge Plan :  Will you be returning to the same living situation after discharge:  No. At discharge, do you have transportation home?: Yes,  Kendra Opitz Do you have the ability to pay for your medications: Yes,  insurance  Release of information consent forms completed and in the chart;  Patient's signature needed at discharge.  Patient to Follow up at: Follow-up Information    [facility] [place] Follow up on 12/15/2017.   Specialty:  Behavioral Health Why:  [name] at 2:00 with [name]. Contact information: [place] [id number] [place] [phone]          Next level of care provider has access to [place] and Suicide Prevention discussed: Yes,  yes  Have you used any form of tobacco in the last 30 days? (Cigarettes, Smokeless Tobacco, Cigars, and/or Pipes): Yes  Has patient been referred to the Quitline?: Patient refused referral  Patient has been referred for addiction treatment: [place], LCSW 12/02/2017, 11:36 AM

## 2017-12-02 NOTE — Progress Notes (Signed)
Recreation Therapy Notes  Date: 9.20.19 Time: 1000 Location: 500 Hall  Group Topic: Communication  Goal Area(s) Addresses:  Patient will effectively communicate with peers in group.  Patient will verbalize benefit of healthy communication.   Behavioral Response: Engaged  Intervention: Hydrologist  Activity: Hot Lava.  Each patient was given a rubber disc and the group was given one extra disc.  Patients were to use the discs to get each person from one end of the hall to the other and back to the starting point.    Education: Communication, Discharge Planning  Education Outcome: Acknowledges understanding/In group clarification offered/Needs additional education.   Clinical Observations/Feedback:  Pt arrived a little late but was able to catch on to what the group was doing.  Pt was pleasant and seemed to enjoy the activity.    Victorino Sparrow, LRT/CTRS         Victorino Sparrow A 12/02/2017 12:58 PM

## 2017-12-02 NOTE — Discharge Summary (Addendum)
Physician Discharge Summary Note  Patient:  [name] [name]. is an 52 y.o., male  MRN:  [id number]  DOB:  1966-03-11  Patient phone:  [phone] (home)   Patient address:   [place] [address],   Total Time spent with patient: Greater than 30 minutes  Date of Admission:  11/18/2017  Date of Discharge: 12-02-17  Reason for Admission: Bizarre Behavior & auditory hallucinations.  Principal Problem: Schizophrenia, paranoid type [facility])  Discharge Diagnoses: Patient Active Problem List   Diagnosis Date Noted  . Schizophrenia, paranoid type ([place]) [F20.0] 11/18/2017  . DKA (diabetic ketoacidoses) ([place]) [E13.10] 11/05/2017  . Acute renal failure superimposed on stage 3 chronic kidney disease ([place]) [N17.9, N18.3] 11/05/2017  . Type II diabetes mellitus with renal manifestations ([place]) [E11.29] 11/05/2017  . Depression [F32.9] 11/05/2017  . Leukocytosis [D72.829] 11/05/2017  . Cough [R05] 11/05/2017  . Tobacco abuse [Z72.0] 11/05/2017  . Schizoaffective disorder, bipolar type ([place]) [F25.0] 06/10/2016  . DM (diabetes mellitus) type II uncontrolled, periph vascular disorder ([place]) [E11.51, E11.65] 02/29/2016  . Schizophrenia, disorganized type ([place]) [F20.1] 06/02/2015  . Hyperlipidemia [E78.5] 09/23/2014  . Renal insufficiency [N28.9] 09/23/2014  . HTN (hypertension) [I10] 03/22/2013  . Seasonal allergies [J30.2] 03/22/2013   Past Psychiatric History: Paranoid Schizophrenia.  Past Medical History:  Past Medical History:  Diagnosis Date  . Diabetes mellitus type II, uncontrolled ([place])   . HTN (hypertension)   . Hyperlipidemia LDL goal <70   . Schizophrenia ([place])    History reviewed. No pertinent surgical history.  Family History:  Family History  Problem Relation Age of Onset  . Breast cancer Mother   . Diabetes Mother   . Prostate cancer Father        StepFather  . Hypertension Father   . Breast cancer Maternal Aunt   . Diabetes Maternal Uncle     Family Psychiatric  History: See H&P  Social History:  Social History   Substance and Sexual Activity  Alcohol Use Yes   Comment: occasional     Social History   Substance and Sexual Activity  Drug Use Yes  . Types: Marijuana    Social History   Socioeconomic History  . Marital status: Single    Spouse name: Not on file  . Number of children: Not on file  . Years of education: Not on file  . Highest education level: Not on file  Occupational History  . Not on file  Social Needs  . Financial resource strain: Not on file  . Food insecurity:    Worry: Not on file    Inability: Not on file  . Transportation needs:    Medical: Not on file    Non-medical: Not on file  Tobacco Use  . Smoking status: Current Every Day Smoker    Packs/day: 2.00    Years: 30.00    Pack years: 60.00    Types: Cigarettes, Cigars  . Smokeless tobacco: Never Used  Substance and Sexual Activity  . Alcohol use: Yes    Comment: occasional  . Drug use: Yes    Types: Marijuana  . Sexual activity: Never  Lifestyle  . Physical activity:    Days per week: Not on file    Minutes per session: Not on file  . Stress: Not on file  Relationships  . Social connections:    Talks on phone: Not on file    Gets together: Not on file    Attends religious service: Not on file  Active member of club or organization: Not on file    Attends meetings of clubs or organizations: Not on file    Relationship status: Not on file  Other Topics Concern  . Not on file  Social History Narrative   Lives with mom   unemployed   No children   Hospital Course: HistoryPerinitialassessment:-Doye Timson Jr.is an 52 y.o.malepresent to [facility] as a walk-in accompanied by his mother with complaints of bizarre behavior and auditory hallucinations. Per report from patient's mother she feels patient is having a psychotic break. Patient is a poor historian and speaks in a tangential language and appears to be  delusional. Patient also displayed thought blocking. Patient denies suicidal / homicidal ideations and denies experiencing auditory / visual hallucinations. Patient report he taking his medication as prescribed but patient's mother feels patient is not taking his medication. Patient was last seen in the ED 08/09/2017 for bizarre behavior and last seen for an assessment 3.28.2019. Patient psychiatrist is Dr. Adele Schilder.  Today upon evaluation: Ptshares, "I'm great. I'm ready to go today."Pt shares that he is excited about being discharged to a stable living environment at the group home. He denies any specific concerns today. His appetite is good. He denies other physical complaints. He denies SI/HI/AH/VH. He is tolerating his medications well and he is in agreement to continue his current regimen without changes.He was able to engage in safety planning including plan to return to [facility] or contact emergency services if he feels unable to maintain his own safety or the safety of others. Pt had no further questions, comments, or concerns.  Plan Of Care/Follow-up recommendations:   -Discharge to outpatient level of care  -Schizophrenia -Continue Risperdal 35m po qAM +3 mg po qhs  Insomnia -Continue with Trazodone 575mqhs prn insomnia -Continue remeron 1551mo qhs  -HLD -Continue simvastatin 23m63m qDay  -COPD -Continue dulera 200-5 mcg/act take 2 puffs BID  -HTN -Continue lopressor 25mg60mBID  -seasonal allergy -Continue claritin 10mg 23mDay  -DMII -Continue current SSI orders with lantus and novolog -Continue repalinide 2mg po76mD  -anxiety -Continue vistaril 25mg po1m prn anxiety  Activity:  as tolerated Diet:  normal Tests:  NA Other:  see above for DC plan  Physical Findings: AIMS: Facial and Oral  Movements Muscles of Facial Expression: None, normal Lips and Perioral Area: None, normal Jaw: None, normal Tongue: None, normal,Extremity Movements Upper (arms, wrists, hands, fingers): None, normal Lower (legs, knees, ankles, toes): None, normal, Trunk Movements Neck, shoulders, hips: None, normal, Overall Severity Severity of abnormal movements (highest score from questions above): None, normal Incapacitation due to abnormal movements: None, normal Patient's awareness of abnormal movements (rate only patient's report): No Awareness, Dental Status Current problems with teeth and/or dentures?: No Does patient usually wear dentures?: No  CIWA:    COWS:     Musculoskeletal: Strength & Muscle Tone: within normal limits Gait & Station: normal Patient leans: N/A  Psychiatric Specialty Exam: Physical Exam  Constitutional: He appears well-developed.  HENT:  Head: Normocephalic.  Eyes: Pupils are equal, round, and reactive to light.  Neck: Normal range of motion.  Cardiovascular: Normal rate.  Respiratory: Effort normal.  GI: Soft.  Genitourinary:  Genitourinary Comments: Deferred  Musculoskeletal: Normal range of motion.  Neurological: He is alert.  Skin: Skin is warm.    Review of Systems  Constitutional: Negative.   HENT: Negative.   Eyes: Negative.   Respiratory: Negative.  Negative for cough and shortness of breath.  Cardiovascular: Negative.  Negative for chest pain and palpitations.  Gastrointestinal: Negative.   Genitourinary: Negative.   Musculoskeletal: Negative.   Skin: Negative.   Neurological: Negative.   Endo/Heme/Allergies: Negative.   Psychiatric/Behavioral: Positive for depression (Stable) and hallucinations (Hx. psychosis). Negative for memory loss, substance abuse and suicidal ideas. The patient has insomnia (Stable). The patient is not nervous/anxious.     Blood pressure 110/71, pulse (!) 126, temperature 98 F (36.7 C), temperature source Oral, resp.  rate 18, height 5' 10"  (1.778 m), weight 108 kg, SpO2 98 %.Body mass index is 34.15 kg/m.  See H&P   Have you used any form of tobacco in the last 30 days? (Cigarettes, Smokeless Tobacco, Cigars, and/or Pipes): Yes  Has this patient used any form of tobacco in the last 30 days? (Cigarettes, Smokeless Tobacco, Cigars, and/or Pipes: Yes, an FDA-approved tobacco cessation medication was offered at discharge.  Blood Alcohol level:  Lab Results  Component Value Date   ETH <10 11/17/2017   ETH <10 63/84/6659   Metabolic Disorder Labs:  Lab Results  Component Value Date   HGBA1C 10.8 (H) 11/20/2017   MPG 263.26 11/20/2017   MPG 278 11/06/2017   Lab Results  Component Value Date   PROLACTIN 63.7 (H) 11/20/2017   Lab Results  Component Value Date   CHOL 116 11/20/2017   TRIG 108 11/20/2017   HDL 41 11/20/2017   CHOLHDL 2.8 11/20/2017   VLDL 22 11/20/2017   LDLCALC 53 11/20/2017   LDLCALC 66 09/30/2017   See Psychiatric Specialty Exam and Suicide Risk Assessment completed by Attending Physician prior to discharge.  Discharge destination:  Home  Is patient on multiple antipsychotic therapies at discharge:  No   Has Patient had three or more failed trials of antipsychotic monotherapy by history:  No  Recommended Plan for Multiple Antipsychotic Therapies: NA  Allergies as of 12/02/2017   No Known Allergies     Medication List    STOP taking these medications   ALLERGY RELIEF 10 MG tablet Generic drug:  loratadine   blood glucose meter kit and supplies Kit   Fluticasone-Salmeterol 250-50 MCG/DOSE Aepb Commonly known as:  ADVAIR Replaced by:  mometasone-formoterol 200-5 MCG/ACT Aero   Insulin Pen Needle 32G X 6 MM Misc   multivitamin with minerals Tabs tablet     TAKE these medications     Indication  aspirin EC 81 MG tablet Take 1 tablet (81 mg total) by mouth daily. For heart health What changed:  additional instructions  Indication:  Heart health    hydrOXYzine 25 MG tablet Commonly known as:  ATARAX/VISTARIL Take 1 tablet (25 mg total) by mouth 3 (three) times daily as needed for anxiety.  Indication:  Feeling Anxious   insulin glargine 100 UNIT/ML injection Commonly known as:  LANTUS Inject 0.6 mLs (60 Units total) into the skin daily. For diabetes management Start taking on:  12/03/2017 What changed:  additional instructions  Indication:  Type 2 Diabetes   metoprolol tartrate 25 MG tablet Commonly known as:  LOPRESSOR Take 1 tablet (25 mg total) by mouth 2 (two) times daily. For high blood pressure What changed:  additional instructions  Indication:  High Blood Pressure Disorder   mirtazapine 7.5 MG tablet Commonly known as:  REMERON Take 1 tablet (7.5 mg total) by mouth at bedtime. Sleep What changed:    medication strength  how much to take  additional instructions  Indication:  Major Depressive Disorder   mometasone-formoterol 200-5 MCG/ACT Aero Commonly  known as:  DULERA Inhale 2 puffs into the lungs 2 (two) times daily. For Asthma Replaces:  Fluticasone-Salmeterol 250-50 MCG/DOSE Aepb  Indication:  Asthma   nicotine polacrilex 2 MG gum Commonly known as:  NICORETTE Take 1 each (2 mg total) by mouth as needed for smoking cessation. (May buy from over the counter): For smoking cessation  Indication:  Nicotine Addiction   repaglinide 2 MG tablet Commonly known as:  PRANDIN Take 1 tablet (2 mg total) by mouth 2 (two) times daily before a meal. For diabetes management What changed:    when to take this  additional instructions  Indication:  Type 2 Diabetes   risperiDONE 3 MG tablet Commonly known as:  RISPERDAL Take 1 tablet (3 mg total) by mouth at bedtime. For mood control What changed:  You were already taking a medication with the same name, and this prescription was added. Make sure you understand how and when to take each.  Indication:  Mood control   risperiDONE 1 MG tablet Commonly known as:   RISPERDAL Take 1 tablet (1 mg total) by mouth daily with breakfast. For mood control Start taking on:  12/03/2017 What changed:    medication strength  how much to take  how to take this  when to take this  additional instructions  Indication:  Schizophrenia, Mood control   simvastatin 40 MG tablet Commonly known as:  ZOCOR Take 1 tablet (40 mg total) by mouth at bedtime. For high cholesterol What changed:    when to take this  additional instructions  Indication:  High Amount of Fats in the Blood, Type II B Hyperlipidemia   traZODone 50 MG tablet Commonly known as:  DESYREL Take 1 tablet (50 mg total) by mouth at bedtime as needed for sleep. What changed:    medication strength  how much to take  when to take this  reasons to take this  Indication:  Trouble Sleeping   Vitamin D (Ergocalciferol) 50000 units Caps capsule Commonly known as:  DRISDOL Take 1 capsule (50,000 Units total) by mouth once a week. For bone health Start taking on:  12/05/2017 What changed:  additional instructions  Indication:  Bone health      Follow-up Information    [place] Follow up on 12/15/2017.   Specialty:  Behavioral Health Why:  [name] at 2:00 with [name]. Contact information: [place] [phone] [place] [phone]         Follow-up recommendations: Activity:  As tolerated Diet: As recommended by your primary care doctor. Keep all scheduled follow-up appointments as recommended.   Comments: Patient is instructed prior to discharge to: Take all medications as prescribed by his/her mental healthcare provider. Report any adverse effects and or reactions from the medicines to his/her outpatient provider promptly. Patient has been instructed & cautioned: To not engage in alcohol and or illegal drug use while on prescription medicines. In the event of worsening symptoms, patient is  instructed to call the crisis hotline, 911 and or go to the nearest ED for appropriate evaluation and treatment of symptoms. To follow-up with his/her primary care provider for your other medical issues, concerns and or health care needs.  Signed: Lindell Spar, NP, PMHNP, FNP-BC 12/02/2017, 10:32 AM   Patient seen, Suicide Assessment Completed.  Disposition Plan Reviewed

## 2017-12-02 NOTE — Progress Notes (Signed)
D: Pt A & O X 3. Denies SI, HI, AVH and pain at this time. D/C home as ordered. Picked up in lobby by family member "I used to be his his care giver". Rates his anxiety, hopelessness and depression all 0/10. Reports he sleeps well with good appetite, normal energy and good concentration level". A: D/C instructions reviewed with pt including prescriptions, medication samples and follow up appointment; compliance encouraged. All belongings from locker #37 given to pt at time of departure. Scheduled medications given with verbal education and effects monitored. Safety checks maintained without incident till time of d/c.  R: Pt receptive to care. Compliant with medications when offered. Denies adverse drug reactions when assessed. Verbalized understanding related to d/c instructions. Signed belonging sheet in agreement with items received from locker. Ambulatory with a steady gait. Appears to be in no physical distress at time of departure.

## 2017-12-02 NOTE — Plan of Care (Signed)
Pt was able to identify positive coping skills to use post d/c at completion of recreation therapy group sessions.   [name] [name], LRT/CTRS

## 2017-12-02 NOTE — BHH Suicide Risk Assessment (Signed)
[name]   Principal Problem: Schizophrenia, paranoid type [name]) Discharge Diagnoses:  Patient Active Problem List   Diagnosis Date Noted  . Schizophrenia, paranoid type ([name]) [F20.0] 11/18/2017  . DKA (diabetic ketoacidoses) ([name]) [E13.10] 11/05/2017  . Acute renal failure superimposed on stage 3 chronic kidney disease ([name]) [N17.9, N18.3] 11/05/2017  . Type II diabetes mellitus with renal manifestations ([name]) [E11.29] 11/05/2017  . Depression [F32.9] 11/05/2017  . Leukocytosis [D72.829] 11/05/2017  . Cough [R05] 11/05/2017  . Tobacco abuse [Z72.0] 11/05/2017  . Schizoaffective disorder, bipolar type ([name]) [F25.0] 06/10/2016  . DM (diabetes mellitus) type II uncontrolled, periph vascular disorder ([name]) [E11.51, E11.65] 02/29/2016  . Schizophrenia, disorganized type ([name]) [F20.1] 06/02/2015  . Hyperlipidemia [E78.5] 09/23/2014  . Renal insufficiency [N28.9] 09/23/2014  . HTN (hypertension) [I10] 03/22/2013  . Seasonal allergies [J30.2] 03/22/2013    Total Time spent with patient: 30 minutes  Musculoskeletal: Strength & Muscle Tone: within normal limits Gait & Station: normal Patient leans: N/A  Psychiatric Specialty Exam: Review of Systems  Constitutional: Negative for chills and fever.  Respiratory: Negative for cough and shortness of breath.   Cardiovascular: Negative for chest pain.  Gastrointestinal: Negative for abdominal pain, heartburn, nausea and vomiting.  Psychiatric/Behavioral: Negative for depression, hallucinations and suicidal ideas. The patient is not nervous/anxious and does not have insomnia.     Blood pressure 110/71, pulse (!) 126, temperature 98 F (36.7 C), temperature source Oral, resp. rate 18, height 5\' 10"  (1.778 m), weight 108 kg, SpO2 98 %.Body mass index is 34.15 kg/m.  General Appearance: Casual and Fairly Groomed  Engineer, water::  Good  Speech:  Clear and Coherent and Normal Rate  Volume:  Normal  Mood:  Euthymic   Affect:  Flat  Thought Process:  Coherent and Goal Directed  Orientation:  Full (Time, Place, and Person)  Thought Content:  Logical  Suicidal Thoughts:  No  Homicidal Thoughts:  No  Memory:  Immediate;   Fair Recent;   Fair Remote;   Fair  Judgement:  Poor  Insight:  Lacking  Psychomotor Activity:  Normal  Concentration:  Fair  Recall:  [name] of Knowledge:Fair  Language: Fair  Akathisia:  No  Handed:    AIMS (if indicated):     Assets:  Resilience Social Support  Sleep:  Number of Hours: 6.25  Cognition: WNL  ADL's:  Intact   Mental Status Per Nursing Assessment::   On Admission:  NA  Demographic Factors:  Male, Caucasian and Unemployed  Loss Factors: Financial problems/change in socioeconomic status  Historical Factors: Impulsivity  Risk Reduction Factors:   Positive social support, Positive therapeutic relationship and Positive coping skills or problem solving skills  Continued Clinical Symptoms:  Severe Anxiety and/or Agitation Schizophrenia:   Paranoid or undifferentiated type  Cognitive Features That Contribute To Risk:  None    Suicide Risk:  Minimal: No identifiable suicidal ideation.  Patients presenting with no risk factors but with morbid ruminations; may be classified as minimal risk based on the severity of the depressive symptoms  Follow-up [place] Follow up on 12/15/2017.   Specialty:  Behavioral Health Why:  [name] at 2:00 with [name]. Contact information: [place] [phone] [place] [phone]        Subjective Data:  HistoryPerinitialassessment:-[name] Vaughnis an 52 y.o.malepresent to [facility] as a walk-in accompanied by his mother with complaints of bizarre behavior and auditory hallucinations.  Per report from patient's mother she feels patient is having a psychotic break. Patient is a poor historian and speaks  in a tangential language and appears to be delusional. Patient also displayed thought blocking. Patient denies suicidal / homicidal ideations and denies experiencing auditory / visual hallucinations. Patient report he taking his medication as prescribed but patient's mother feels patient is not taking his medication. Patient was last seen in the ED 08/09/2017 for bizarre behavior and last seen for an assessment 3.28.2019. Patient psychiatrist is Dr. Adele Schilder.  Today upon evaluation: Ptshares, "I'm great. I'm ready to go today." Pt shares that he is excited about being discharged to a stable living environment at the group home. He denies any specific concerns today. His appetite is good. He denies other physical complaints. He denies SI/HI/AH/VH. He is tolerating his medications well and he is in agreement to continue his current regimen without changes.He was able to engage in safety planning including plan to return to [facility] or contact emergency services if he feels unable to maintain his own safety or the safety of others. Pt had no further questions, comments, or concerns.     Plan Of Care/Follow-up recommendations:   -Discharge to outpatient level of care  -Schizophrenia -Continue Risperdal 1mg  po qAM +3 mg po qhs  Insomnia -Continue with Trazodone 50mg  qhs prn insomnia -Continue remeron 15mg  po qhs  -HLD -Continue simvastatin 40mg  po qDay  -COPD -Continue dulera 200-5 mcg/act take 2 puffs BID  -HTN -Continue lopressor 25mg  po BID  -seasonal allergy -Continue claritin 10mg  po qDay  -DMII -Continue current SSI orders with lantus and novolog -Continue repalinide 2mg  po BID  -anxiety -Continue vistaril 25mg  po TID prn anxiety  Activity:  as tolerated Diet:  normal Tests:  NA Other:  see above for DC plan  Pennelope Bracken, MD 12/02/2017, 10:11 AM

## 2017-12-02 NOTE — Telephone Encounter (Signed)
Received Home Health Certification and Plan of Care; forwarded to provider/SLS 09/20

## 2017-12-02 NOTE — Progress Notes (Signed)
Recreation Therapy Notes  INPATIENT RECREATION TR PLAN  Patient Details Name: [name] [name]. MRN: [id number] DOB: 01/05/1966 Today's Date: 12/02/2017  Rec Therapy Plan Is patient appropriate for Therapeutic Recreation?: Yes Treatment times per week: about 3 days Estimated Length of Stay: 5-7 days TR Treatment/Interventions: Group participation (Comment)  Discharge Criteria Pt will be discharged from therapy if:: Discharged Treatment plan/goals/alternatives discussed and agreed upon by:: Patient/family  Discharge Summary Short term goals set: See patient care plan Short term goals met: Complete Progress toward goals comments: Groups attended Which groups?: Self-esteem, Coping skills, Goal setting, Anger management, Other (Comment), Communication(Music) Reason goals not met: None Therapeutic equipment acquired: N/A Reason patient discharged from therapy: Discharge from hospital Pt/family agrees with progress & goals achieved: Yes Date patient discharged from therapy: 12/02/17   Victorino Sparrow, LRT/CTRS  Ria Comment, [place] Kading A 12/02/2017, 11:06 AM

## 2017-12-04 LAB — GLUCOSE, CAPILLARY
GLUCOSE-CAPILLARY: 140 mg/dL — AB (ref 70–99)
GLUCOSE-CAPILLARY: 225 mg/dL — AB (ref 70–99)

## 2017-12-15 ENCOUNTER — Ambulatory Visit (HOSPITAL_COMMUNITY): Payer: Medicare HMO | Admitting: Psychiatry

## 2017-12-24 ENCOUNTER — Other Ambulatory Visit: Payer: Self-pay | Admitting: Family Medicine

## 2017-12-24 DIAGNOSIS — J42 Unspecified chronic bronchitis: Secondary | ICD-10-CM

## 2017-12-28 ENCOUNTER — Other Ambulatory Visit (HOSPITAL_COMMUNITY): Payer: Self-pay | Admitting: Psychiatry

## 2017-12-28 DIAGNOSIS — F209 Schizophrenia, unspecified: Secondary | ICD-10-CM

## 2018-01-03 IMAGING — US US RENAL
1 series · 14 of 25 positions shown · non-contrast
Comparison: None.

CLINICAL DATA: Stage III chronic renal disease

EXAM:
RENAL / URINARY TRACT ULTRASOUND COMPLETE

[Series 1: us renal · 0.28mm/px · 14 of 37 slices shown]
[im 1/37]
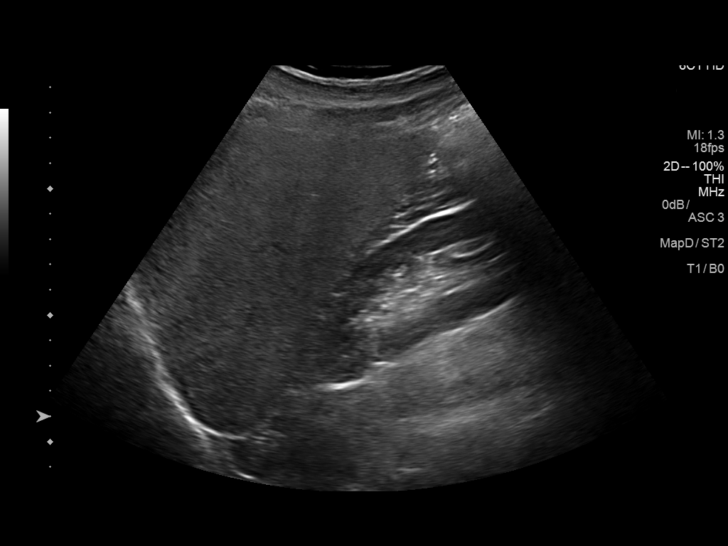
[im 4/37]
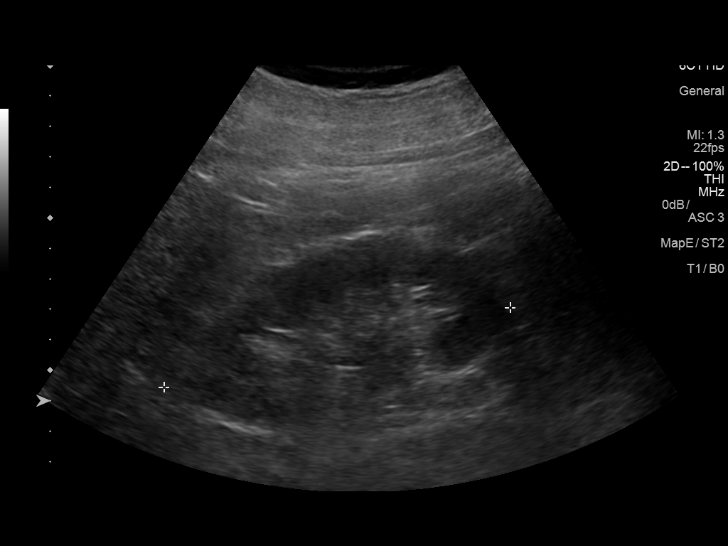
[im 7/37]
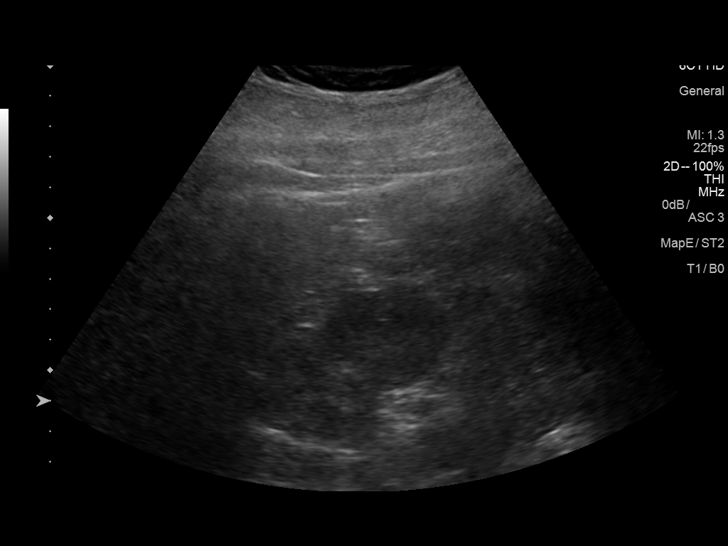
[im 10/37]
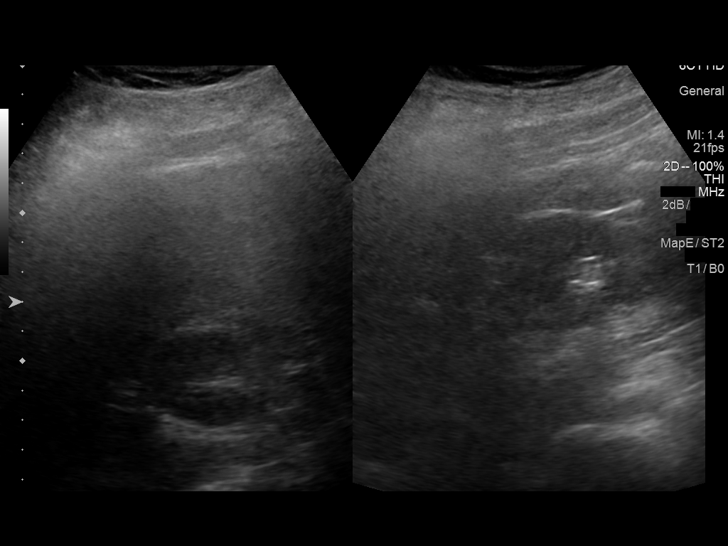
[im 13/37]
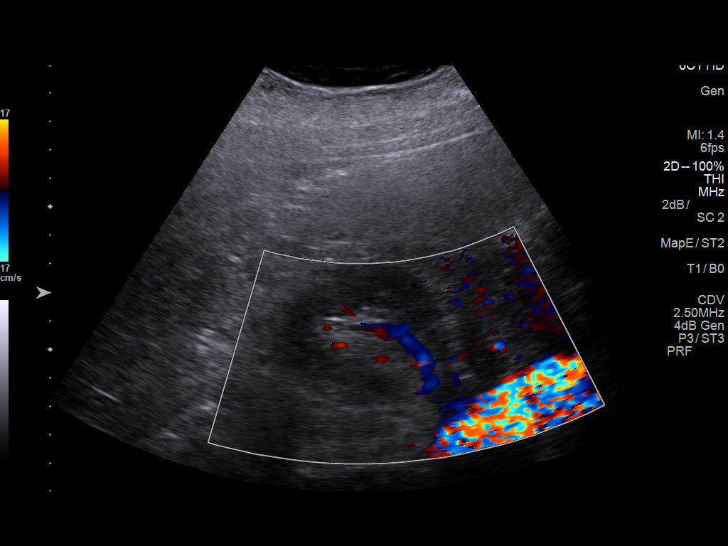
[im 14/37]
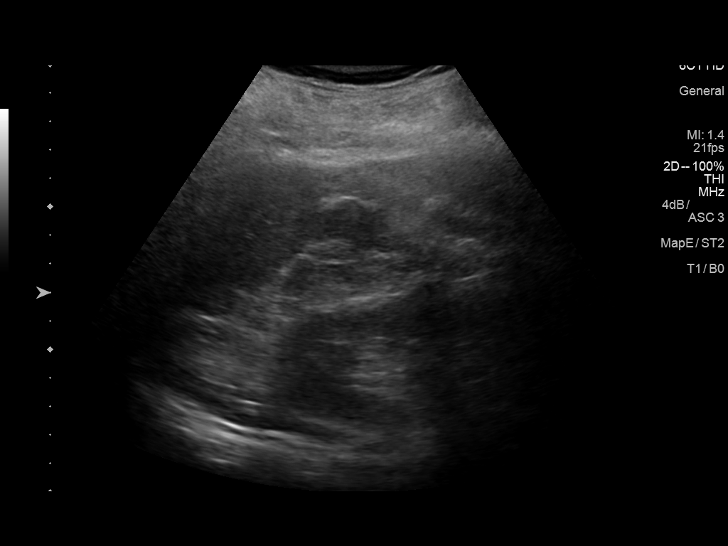
[im 17/37]
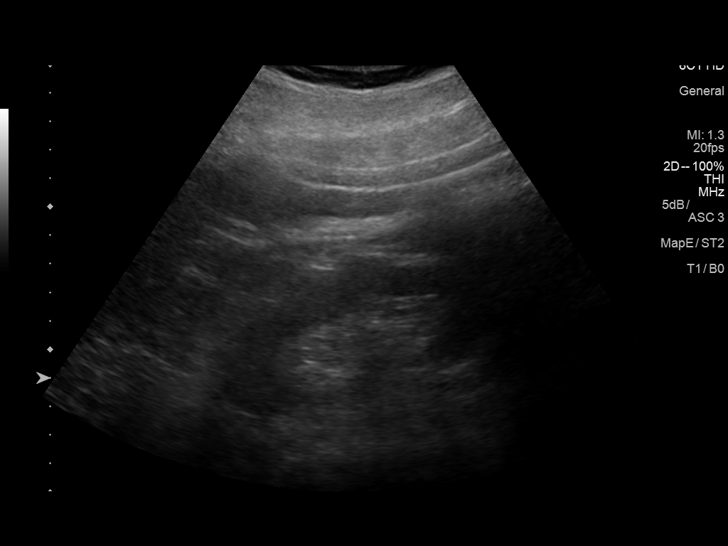
[im 20/37]
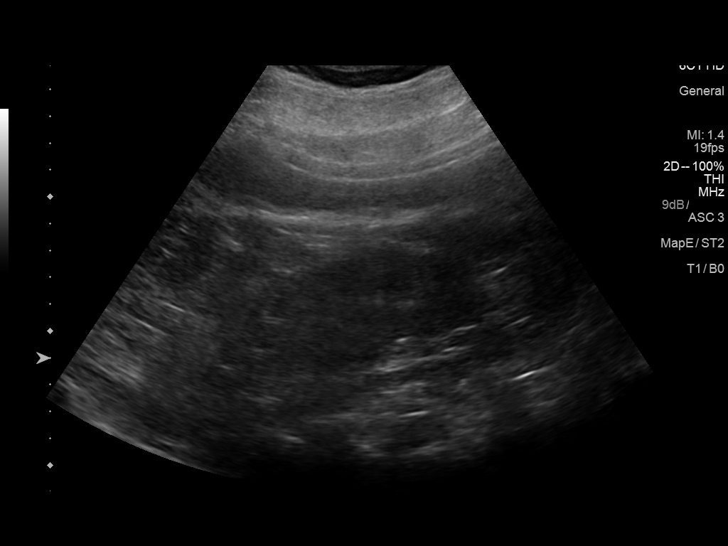
[im 23/37]
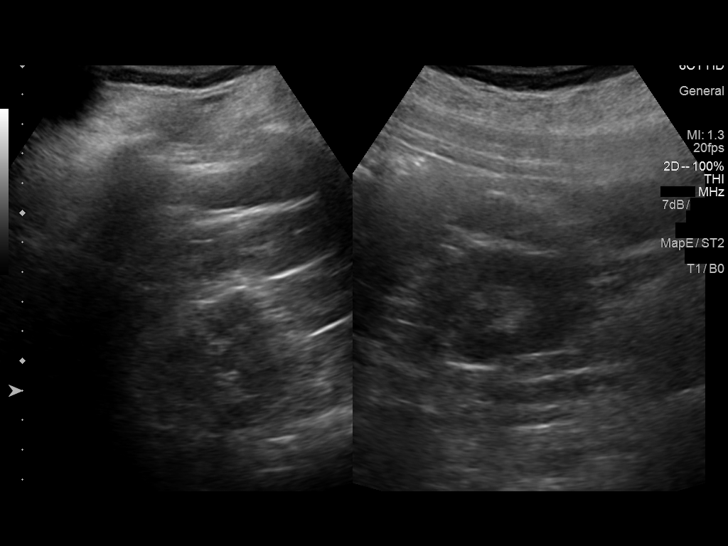
[im 25/37]
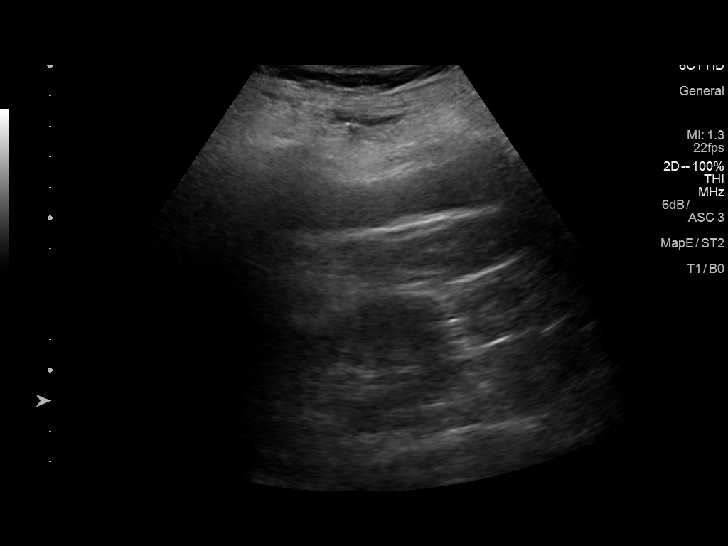
[im 28/37]
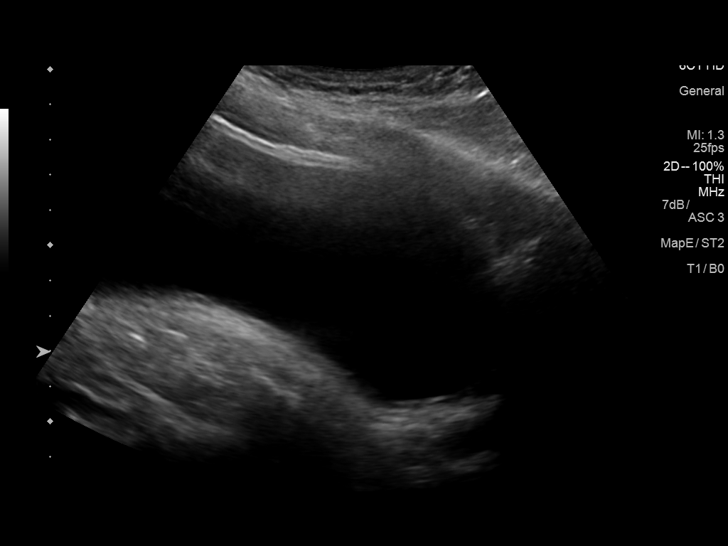
[im 31/37]
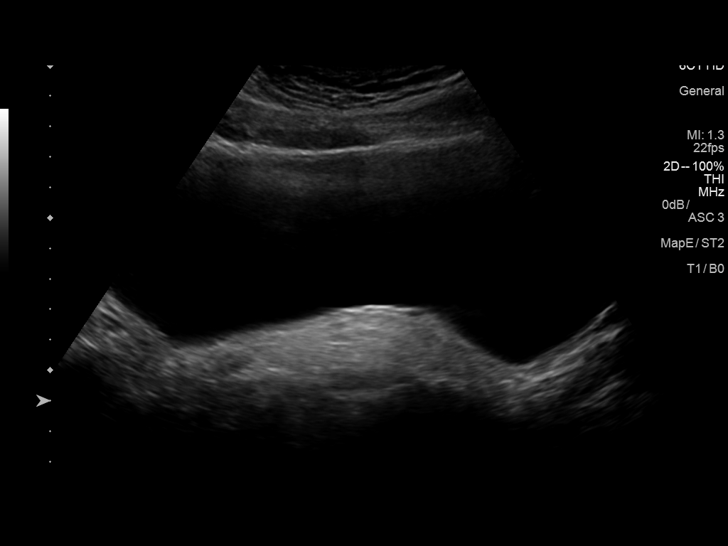
[im 34/37]
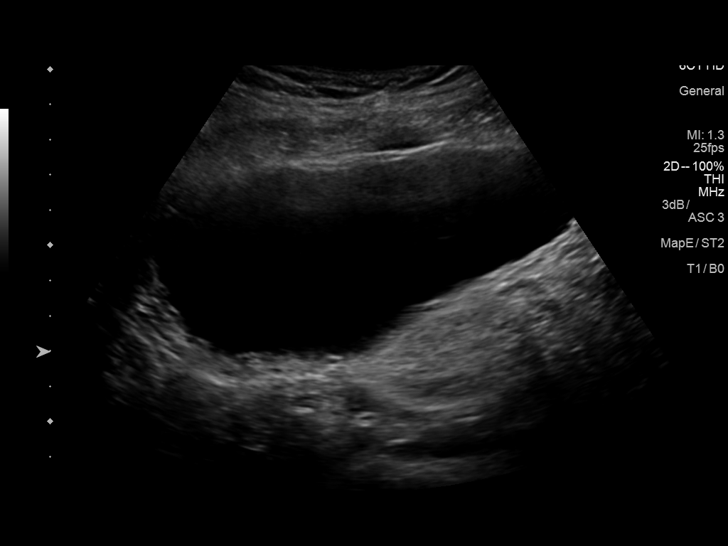
[im 37/37]
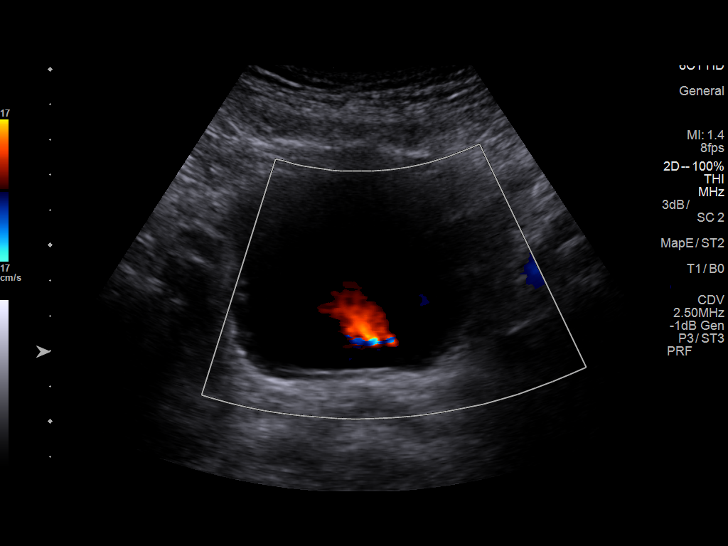

[14 of 25 positions shown; findings below may reference images not displayed]

FINDINGS: Right Kidney:

Length: 11.7 cm. Echogenicity and renal cortical thickness are
within normal limits. No mass, perinephric fluid, or hydronephrosis
visualized. No sonographically demonstrable calculus or
ureterectasis.

Left Kidney:

Length: 11.2 cm. Echogenicity and renal cortical thickness are
within normal limits. No mass, perinephric fluid, or hydronephrosis
visualized. No sonographically demonstrable calculus or
ureterectasis.

Bladder:

Appears normal for degree of bladder distention.
IMPRESSION: Study within normal limits.

## 2018-01-04 ENCOUNTER — Other Ambulatory Visit: Payer: Self-pay | Admitting: *Deleted

## 2018-01-04 MED ORDER — FLUTICASONE-SALMETEROL 250-50 MCG/DOSE IN AEPB: 1.0000 | INHALATION_SPRAY | Freq: Two times a day (BID) | RESPIRATORY_TRACT | 5 refills | Status: DC

## 2018-01-13 ENCOUNTER — Other Ambulatory Visit: Payer: Self-pay

## 2018-01-13 ENCOUNTER — Encounter (HOSPITAL_COMMUNITY): Payer: Self-pay | Admitting: Obstetrics and Gynecology

## 2018-01-13 ENCOUNTER — Emergency Department (HOSPITAL_COMMUNITY): Payer: Medicare HMO

## 2018-01-13 ENCOUNTER — Emergency Department (HOSPITAL_COMMUNITY)
Admission: EM | Admit: 2018-01-13 | Discharge: 2018-01-14 | Disposition: A | Discharge status: Other Acute Inpatient Hospital | Payer: Medicare HMO | Attending: Emergency Medicine | Admitting: Emergency Medicine

## 2018-01-13 ENCOUNTER — Other Ambulatory Visit (HOSPITAL_COMMUNITY): Payer: Self-pay

## 2018-01-13 DIAGNOSIS — F201 Disorganized schizophrenia: Secondary | ICD-10-CM | POA: Diagnosis not present

## 2018-01-13 DIAGNOSIS — Z7982 Long term (current) use of aspirin: Secondary | ICD-10-CM | POA: Diagnosis not present

## 2018-01-13 DIAGNOSIS — F22 Delusional disorders: Secondary | ICD-10-CM | POA: Insufficient documentation

## 2018-01-13 DIAGNOSIS — Z794 Long term (current) use of insulin: Secondary | ICD-10-CM | POA: Insufficient documentation

## 2018-01-13 DIAGNOSIS — F1290 Cannabis use, unspecified, uncomplicated: Secondary | ICD-10-CM | POA: Insufficient documentation

## 2018-01-13 DIAGNOSIS — I129 Hypertensive chronic kidney disease with stage 1 through stage 4 chronic kidney disease, or unspecified chronic kidney disease: Secondary | ICD-10-CM | POA: Insufficient documentation

## 2018-01-13 DIAGNOSIS — N183 Chronic kidney disease, stage 3 (moderate): Secondary | ICD-10-CM | POA: Diagnosis not present

## 2018-01-13 DIAGNOSIS — F17210 Nicotine dependence, cigarettes, uncomplicated: Secondary | ICD-10-CM | POA: Insufficient documentation

## 2018-01-13 DIAGNOSIS — F309 Manic episode, unspecified: Secondary | ICD-10-CM | POA: Diagnosis present

## 2018-01-13 DIAGNOSIS — E1122 Type 2 diabetes mellitus with diabetic chronic kidney disease: Secondary | ICD-10-CM | POA: Diagnosis not present

## 2018-01-13 DIAGNOSIS — Z79899 Other long term (current) drug therapy: Secondary | ICD-10-CM | POA: Insufficient documentation

## 2018-01-13 LAB — COMPREHENSIVE METABOLIC PANEL
ALK PHOS: 64 U/L (ref 38–126)
ALT: 37 U/L (ref 0–44)
AST: 33 U/L (ref 15–41)
Albumin: 4 g/dL (ref 3.5–5.0)
Anion gap: 11 (ref 5–15)
BUN: 17 mg/dL (ref 6–20)
CALCIUM: 9.2 mg/dL (ref 8.9–10.3)
CHLORIDE: 105 mmol/L (ref 98–111)
CO2: 20 mmol/L — AB (ref 22–32)
Creatinine, Ser: 1.97 mg/dL — ABNORMAL HIGH (ref 0.61–1.24)
GFR calc non Af Amer: 38 mL/min — ABNORMAL LOW (ref >60)
GFR, EST AFRICAN AMERICAN: 44 mL/min — AB (ref >60)
GLUCOSE: 162 mg/dL — AB (ref 70–99)
Potassium: 3.8 mmol/L (ref 3.5–5.1)
SODIUM: 136 mmol/L (ref 135–145)
Total Bilirubin: 0.4 mg/dL (ref 0.3–1.2)
Total Protein: 7.7 g/dL (ref 6.5–8.1)

## 2018-01-13 LAB — CBC
HCT: 46.8 % (ref 39.0–52.0)
Hemoglobin: 14.7 g/dL (ref 13.0–17.0)
MCH: 28.5 pg (ref 26.0–34.0)
MCHC: 31.4 g/dL (ref 30.0–36.0)
MCV: 90.9 fL (ref 80.0–100.0)
NRBC: 0 % (ref 0.0–0.2)
Platelets: 466 10*3/uL — ABNORMAL HIGH (ref 150–400)
RBC: 5.15 MIL/uL (ref 4.22–5.81)
RDW: 14 % (ref 11.5–15.5)
WBC: 14.9 10*3/uL — ABNORMAL HIGH (ref 4.0–10.5)

## 2018-01-13 LAB — ACETAMINOPHEN LEVEL: Acetaminophen (Tylenol), Serum: <10 ug/mL — ABNORMAL LOW (ref 10–30)

## 2018-01-13 LAB — ETHANOL: Alcohol, Ethyl (B): <10 mg/dL (ref <10)

## 2018-01-13 LAB — SALICYLATE LEVEL

## 2018-01-13 MED ORDER — RISPERIDONE 2 MG PO TABS
3.0000 mg | ORAL_TABLET | Freq: Every day | ORAL | Status: DC
  Administered 2018-01-13: 3 mg via ORAL
  Filled 2018-01-13: qty 1

## 2018-01-13 MED ORDER — HYDROXYZINE HCL 25 MG PO TABS
25.0000 mg | ORAL_TABLET | Freq: Three times a day (TID) | ORAL | Status: DC | PRN
  Administered 2018-01-13: 25 mg via ORAL
  Filled 2018-01-13: qty 1

## 2018-01-13 MED ORDER — HALOPERIDOL LACTATE 5 MG/ML IJ SOLN
5.0000 mg | Freq: Once | INTRAMUSCULAR | Status: AC
  Administered 2018-01-13: 5 mg via INTRAMUSCULAR
  Filled 2018-01-13: qty 1

## 2018-01-13 MED ORDER — RISPERIDONE 1 MG PO TABS
1.0000 mg | ORAL_TABLET | Freq: Every day | ORAL | Status: DC
  Administered 2018-01-14: 1 mg via ORAL
  Filled 2018-01-13: qty 1

## 2018-01-13 MED ORDER — LORAZEPAM 1 MG PO TABS
1.0000 mg | ORAL_TABLET | Freq: Once | ORAL | Status: AC
  Administered 2018-01-13: 1 mg via ORAL
  Filled 2018-01-13: qty 1

## 2018-01-13 MED ORDER — TRAZODONE HCL 50 MG PO TABS
50.0000 mg | ORAL_TABLET | Freq: Every day | ORAL | Status: DC
  Administered 2018-01-13: 50 mg via ORAL
  Filled 2018-01-13: qty 1

## 2018-01-13 MED ORDER — METOPROLOL TARTRATE 25 MG PO TABS
25.0000 mg | ORAL_TABLET | Freq: Once | ORAL | Status: AC
  Administered 2018-01-13: 25 mg via ORAL
  Filled 2018-01-13: qty 1

## 2018-01-13 NOTE — ED Notes (Signed)
Family at bedside. Guardian (uncle) at bedside and patients past caregiver. Patient rambling incomprehensible speech. Word salad.

## 2018-01-13 NOTE — ED Notes (Addendum)
Patient given meal tray. Fed patient a couple bites of sweet potatoes and sips of lemonade. Patient now refusing to eat. Patient still having garbled, pressured, word salad speech. Care giver at bedside. Assessed patients pants for changing. No changing needed at this time. Caregiver will call when patient can urinate. This RN has not put patient in scrubs yet due to patients manic behavior. Hydroxyzine given and will reassess.

## 2018-01-13 NOTE — ED Notes (Signed)
Family aware we need urine sample.

## 2018-01-13 NOTE — ED Triage Notes (Signed)
Per Pt's Friends: Pt has been acting manic since yesterday. Pt refuses to take his psychiatric medications and has a hx of paranoid schizophrenia.  Pt has been reportedly taking his insulin. Pt was shakey and refusing to cooperate initially with staff.  RN convinced pt to be seen.

## 2018-01-13 NOTE — ED Notes (Signed)
Bed: WLPT3 Expected date:  Expected time:  Means of arrival:  Comments: 

## 2018-01-13 NOTE — BH Assessment (Signed)
BHH Assessment Progress Note  Case was staffed with Parks FNP who recommended patient be observed and monitored for safety.    

## 2018-01-13 NOTE — ED Notes (Signed)
Entered pt's room to find him talking nonstop incomprehensible speech. Visitor at bedside as well as sitter present. Will speak with PA about giving pt some more medication to help with manic behavior.

## 2018-01-13 NOTE — ED Provider Notes (Signed)
[facility] DEPT Provider Note   CSN: [id number] Arrival date & time: 01/13/18  1308     History   Chief Complaint Chief Complaint  Patient presents with  . Manic Behavior  . Medical Clearance    HPI [name] [name]. is a 52 y.o. male past medical history of diabetes, hypertension, hyperlipidemia, paranoid schizophrenia brought in by uncle and friend who noticed that over the last 3 days, patient has been having paranoid thoughts and has been talking to himself.  Uncle states that yesterday, symptoms became worse and he started having paranoid delusions and not making sense.  Friend at bedside stated that patient does have a history of diabetes and takes insulin.  He did not take his insulin yesterday.  He states that he has not been taking his medications for his schizophrenia.  He has had similar episodes of symptoms before and required hospitalization.  They initially went to [facility] but was told to come to the emergency department for further evaluation.  EM LEVEL 5 CAVEAT DUE TO PATIENT'S PSYCHIATRIC DISORDER    The history is provided by a friend.    Past Medical History:  Diagnosis Date  . Diabetes mellitus type II, uncontrolled ([place])   . HTN (hypertension)   . Hyperlipidemia LDL goal <70   . Schizophrenia [facility])     Patient Active Problem List   Diagnosis Date Noted  . Schizophrenia, paranoid type ([place]) 11/18/2017  . DKA (diabetic ketoacidoses) ([place]) 11/05/2017  . Acute renal failure superimposed on stage 3 chronic kidney disease ([place]) 11/05/2017  . Type II diabetes mellitus with renal manifestations ([place]) 11/05/2017  . Depression 11/05/2017  . Leukocytosis 11/05/2017  . Cough 11/05/2017  . Tobacco abuse 11/05/2017  . Schizoaffective disorder, bipolar type ([place]) 06/10/2016  . DM (diabetes mellitus) type II uncontrolled, periph vascular disorder ([place]) 02/29/2016  . Schizophrenia, disorganized type ([place]) 06/02/2015  .  Hyperlipidemia 09/23/2014  . Renal insufficiency 09/23/2014  . HTN (hypertension) 03/22/2013  . Seasonal allergies 03/22/2013    History reviewed. No pertinent surgical history.      Home Medications    Prior to Admission medications   Medication Sig Start Date End Date Taking? Authorizing Provider  aspirin EC 81 MG tablet Take 1 tablet (81 mg total) by mouth daily. For heart health 12/02/17  Yes Lindell Spar I, NP  Fluticasone-Salmeterol (WIXELA INHUB) 250-50 MCG/DOSE AEPB Inhale 1 puff into the lungs 2 (two) times daily. Patient taking differently: Inhale 1 puff into the lungs 2 (two) times daily as needed (For shortness of breath.).  01/04/18  Yes Roma Schanz R, DO  hydrOXYzine (ATARAX/VISTARIL) 25 MG tablet Take 1 tablet (25 mg total) by mouth 3 (three) times daily as needed for anxiety. 12/02/17  Yes Lindell Spar I, NP  insulin glargine (LANTUS) 100 UNIT/ML injection Inject 0.6 mLs (60 Units total) into the skin daily. For diabetes management 12/03/17  Yes Lindell Spar I, NP  metoprolol tartrate (LOPRESSOR) 25 MG tablet Take 1 tablet (25 mg total) by mouth 2 (two) times daily. For high blood pressure 12/02/17  Yes Nwoko, Agnes I, NP  mirtazapine (REMERON) 7.5 MG tablet Take 1 tablet (7.5 mg total) by mouth at bedtime. Sleep 12/02/17  Yes Lindell Spar I, NP  repaglinide (PRANDIN) 2 MG tablet Take 1 tablet (2 mg total) by mouth 2 (two) times daily before a meal. For diabetes management 12/02/17  Yes Lindell Spar I, NP  risperiDONE (RISPERDAL) 1 MG tablet Take 1  tablet (1 mg total) by mouth daily with breakfast. For mood control 12/03/17  Yes Nwoko, Herbert Pun I, NP  risperiDONE (RISPERDAL) 3 MG tablet Take 1 tablet (3 mg total) by mouth at bedtime. For mood control 12/02/17  Yes Nwoko, Herbert Pun I, NP  simvastatin (ZOCOR) 40 MG tablet Take 1 tablet (40 mg total) by mouth at bedtime. For high cholesterol 12/02/17  Yes Lindell Spar I, NP  traZODone (DESYREL) 50 MG tablet Take 1 tablet (50 mg total)  by mouth at bedtime as needed for sleep. 12/02/17  Yes Lindell Spar I, NP  Vitamin D, Ergocalciferol, (DRISDOL) 50000 units CAPS capsule Take 1 capsule (50,000 Units total) by mouth once a week. For bone health Patient taking differently: Take 50,000 Units by mouth every Monday. For bone health 12/05/17  Yes Lindell Spar I, NP  LANTUS SOLOSTAR 100 UNIT/ML Solostar Pen Inject 60 Units into the skin daily. 12/23/17   [provider]  mirtazapine (REMERON) 30 MG tablet Take 30 mg by mouth at bedtime. 12/05/17   [provider]    Family History Family History  Problem Relation Age of Onset  . Breast cancer Mother   . Diabetes Mother   . Prostate cancer Father        StepFather  . Hypertension Father   . Breast cancer Maternal Aunt   . Diabetes Maternal Uncle     Social History Social History   Tobacco Use  . Smoking status: Current Every Day Smoker    Packs/day: 2.00    Years: 30.00    Pack years: 60.00    Types: Cigarettes, Cigars  . Smokeless tobacco: Never Used  Substance Use Topics  . Alcohol use: Yes    Comment: occasional  . Drug use: Yes    Types: Marijuana     Allergies   Patient has no known allergies.   Review of Systems Review of Systems  Unable to perform ROS: Psychiatric disorder     Physical Exam Updated Vital Signs BP (!) 161/111   Pulse (!) 110   Temp 98.3 F (36.8 C) (Oral)   Resp 19   SpO2 98%   Physical Exam  Constitutional: He is oriented to person, place, and time. He appears well-developed and well-nourished.  HENT:  Head: Normocephalic and atraumatic.  Mouth/Throat: Oropharynx is clear and moist and mucous membranes are normal.  Eyes: Pupils are equal, round, and reactive to light. Conjunctivae, EOM and lids are normal.  Neck: Full passive range of motion without pain.  Cardiovascular: Normal rate, regular rhythm, normal heart sounds and normal pulses. Exam reveals no gallop and no friction rub.  No murmur  heard. Pulmonary/Chest: Effort normal and breath sounds normal.  Abdominal: Soft. Normal appearance. There is no tenderness. There is no rigidity and no guarding.  Musculoskeletal: Normal range of motion.  Neurological: He is alert and oriented to person, place, and time.  Skin: Skin is warm and dry. Capillary refill takes less than 2 seconds.  Moving all extremities spontaneously.  Follows commands.   Psychiatric: His speech is rapid and/or pressured and slurred. He is hyperactive.  Patient with rapid and pressured speech.  Not making coherent sentences.  Not answering questions appropriately.  Nursing note and vitals reviewed.    ED Treatments / Results  Labs (all labs ordered are listed, but only abnormal results are displayed) Labs Reviewed  COMPREHENSIVE METABOLIC PANEL - Abnormal; Notable for the following components:      Result Value   CO2 20 (*)  Glucose, Bld 162 (*)    Creatinine, Ser 1.97 (*)    GFR calc non Af Amer 38 (*)    GFR calc Af Amer 44 (*)    All other components within normal limits  ACETAMINOPHEN LEVEL - Abnormal; Notable for the following components:   Acetaminophen (Tylenol), Serum <10 (*)    All other components within normal limits  CBC - Abnormal; Notable for the following components:   WBC 14.9 (*)    Platelets 466 (*)    All other components within normal limits  ETHANOL  SALICYLATE LEVEL  RAPID URINE DRUG SCREEN, HOSP PERFORMED  URINALYSIS, ROUTINE W REFLEX MICROSCOPIC    EKG None  Radiology No results found.  Procedures Procedures (including critical care time)  Medications Ordered in ED Medications  metoprolol tartrate (LOPRESSOR) tablet 25 mg (25 mg Oral Given 01/13/18 1428)     Initial Impression / Assessment and Plan / ED Course  I have reviewed the triage vital signs and the nursing notes.  Pertinent labs & imaging results that were available during my care of the patient were reviewed by me and considered in my medical  decision making (see chart for details).     52 y.o. M with PMH/o hyperlipidemia, diabetes, hypertension, paranoid schizophrenia brought in by uncle and friend for paranoid behavior, talking to himself.  They think he has not been taking his medications.  Also has a history of diabetes.  Reports he did not take his insulin yesterday.  Patient with pressured and rapid speech. On initial ED arrival, patient is tachycardic and hypertensive.  We will plan to check basic labs, TTS consultation.  Acetaminophen level, ethanol level, salicylate level unremarkable.  CBC shows leukocytosis of 14.9.  CMP shows bicarb of 20, glucose of 162.  Creatinine is slightly elevated 1.97.  Review of patient records show that his baseline is somewhere between 1.6 and 1.7.  He had previously been 1.98 on 11/07/17.  Do not suspect DKA given history/physical exam and work-up.  Patient blood pressure slightly improved after Lopressor and then elevated again.  I suspect this combination due to noncompliance of medications as well as agitation and paranoia.  Additionally, patient is tachycardic.  Again I suspect this is most likely due to paranoia and agitation.  His O2 sats were documented at 77% and 80 per 6% on room air.  I discussed with nursing this was because patient kept pulling off the O2 sat monitor.  On my evaluation, he is in no evidence of respiratory distress and has even, unlabored breathing.  Patient signed out to Alecia Lemming, PA-C with TTS consult pending. Anticipate patient will be admitted for paranoia.   Final Clinical Impressions(s) / ED Diagnoses   Final diagnoses:  Paranoid behavior [facility])    ED Discharge Orders    None       Volanda Napoleon, PA-C 01/13/18 Is[place], Ankit, MD 01/14/18 [phone]

## 2018-01-13 NOTE — ED Notes (Signed)
With sitter and NT, attempting to place pt in paper scrubs. Pt with increased agitation, stating "no". Was able to administer nighttime meds, however pt is now allowing staff to change him at this time.

## 2018-01-13 NOTE — ED Notes (Addendum)
Previous caregiver states the uncle is now taking care of patient after patients mother died. Patients uncle moved patient to [place]. Patients uncles states he noticed patients change yesterday. Previous caregiver states the change has been going on for 3 days now. Previous caregiver is not convinced uncle is providing good care to patient and states patient sister is trying to be POA.

## 2018-01-13 NOTE — ED Provider Notes (Signed)
5:55 PM handoff from late and PA-C at 4 PM.  Patient was awaiting TTS evaluation for decompensated schizophrenia.  Patient has been off his medications.  Patient has had worsening paranoid thoughts and hallucinations.  TTS eval complete.  Current plan is for observation overnight, restarting medications, and having psychiatrist seen in the morning.  BP (!) 172/122   Pulse (!) 104   Temp 98.3 F (36.8 C) (Oral)   Resp 19   SpO2 96% '  Patient currently voluntary.  If he decides sleep, will need IVC.   Carlisle Cater, PA-C 01/13/18 1756    Hayden Rasmussen, MD 01/14/18 1315

## 2018-01-13 NOTE — BH Assessment (Signed)
Assessment Note  [name] [name]. is an 52 y.o. male that presents this date actively psychotic with a history of Schizophrenia. Patient presents with caretaker [name] [phone] who provides collateral information. Tamala Julian states he was the caretaker for mother and patient until the mother passed away in early 2023-01-27 when patient decided to go and live with his Barbaraann Rondo in [place]. Tamala Julian reports he provided care for both mother and patient in his residence. Tamala Julian states patient did not have a guardian and made the decision on his own to reside in [place]. Smith states Arfeen MD had been prescribing patient's medications to assist with symptoms of Schizophrenia and was complaint with his medications at the time he left for [place]. Tamala Julian states patient resided in that location from 12/16/17 until today when the Uncle brought patient back to Smith's residence with patient being psychotic. Tamala Julian states the [place] provided limited information and "just wanted to leave patient" at Smith's residence although Tamala Julian informed Uncle to also present to ED with patient. Tamala Julian is unaware if patient has been using any substances or has been on his medications. Patient is observed to be actively psychotic and is speaking incoherently. Patient is not oriented to time/place and is unable to participate in the assessment. Tamala Julian reports patient's sister is in the process of obtaining guardianship as of this date. Per notes, "Patient has a past medical history of diabetes, hypertension, hyperlipidemia, paranoid schizophrenia brought in by uncle and friend who noticed that over the last 3 days, patient has been having paranoid thoughts and has been talking to himself. Uncle states that yesterday, symptoms became worse and he started having paranoid delusions and not making sense. Friend at bedside stated that patient does have a history of diabetes and takes insulin. He did not take his insulin yesterday. He  states that he has not been taking his medications for his schizophrenia. He has had similar episodes of symptoms before and required hospitalization. They initially went to [facility] but was told to come to the emergency department for further evaluation". Case was staffed with Romilda Garret FNP who recommended patient be observed and monitored for safety.   Diagnosis: F20.9 Schizophrenia   Past Medical History:  Past Medical History:  Diagnosis Date  . Diabetes mellitus type II, uncontrolled ([place])   . HTN (hypertension)   . Hyperlipidemia LDL goal <70   . Schizophrenia ([place])     History reviewed. No pertinent surgical history.  Family History:  Family History  Problem Relation Age of Onset  . Breast cancer Mother   . Diabetes Mother   . Prostate cancer Father        StepFather  . Hypertension Father   . Breast cancer Maternal Aunt   . Diabetes Maternal Uncle     Social History:  reports that he has been smoking cigarettes and cigars. He has a 60.00 pack-year smoking history. He has never used smokeless tobacco. He reports that he drinks alcohol. He reports that he has current or past drug history. Drug: Marijuana.  Additional Social History:  Alcohol / Drug Use Pain Medications: See MAR Prescriptions: See MAR Over the Counter: SEE MAR History of alcohol / drug use?: Yes Longest period of sobriety (when/how long): Unknown Negative Consequences of Use: (UTA) Withdrawal Symptoms: (UTA) Substance #1 Name of Substance 1: Alcohol use per hx 1 - Age of First Use: UTA 1 - Amount (size/oz): UTA 1 - Frequency: UTA 1 - Duration: UTA 1 - Last Use / Amount: UTA  CIWA: CIWA-Ar BP: (!) 182/160 Pulse Rate: (!) 113 COWS:    Allergies: No Known Allergies  Home Medications:  (Not in a hospital admission)  OB/GYN Status:  No LMP for male patient.  General Assessment Data Location of Assessment: [facility] ED TTS Assessment: In system Is this a Tele or Face-to-Face Assessment?:  Face-to-Face Is this an Initial Assessment or a Re-assessment for this encounter?: Initial Assessment Patient Accompanied by:: (NA) Language Other than English: No Living Arrangements: (With relatives) What gender do you identify as?: Male Marital status: Single Maiden name: NA Pregnancy Status: No Living Arrangements: Other relatives Can pt return to current living arrangement?: Yes Admission Status: Voluntary Is patient capable of signing voluntary admission?: No Referral Source: Self/Family/Friend Insurance type: Medicare  Medical Screening Exam ([place]) Medical Exam completed: Yes  Crisis Care Plan Living Arrangements: Other relatives Legal Guardian: (NA) Name of Psychiatrist: Arfeen MD Name of Therapist: None  Education Status Is patient currently in school?: No Is the patient employed, unemployed or receiving disability?: Receiving disability income  Risk to self with the past 6 months Suicidal Ideation: (UTA) Has patient been a risk to self within the past 6 months prior to admission? : (UTA) Suicidal Intent: (UTA) Has patient had any suicidal intent within the past 6 months prior to admission? : (UTA) Is patient at risk for suicide?: (UTA) Suicidal Plan?: (UTA) Has patient had any suicidal plan within the past 6 months prior to admission? : (UTA) Access to Means: (UTA) What has been your use of drugs/alcohol within the last 12 months?: Past hx of ETOH use Previous Attempts/Gestures: No(Per notes) How many times?: 0 Other Self Harm Risks: UTA Triggers for Past Attempts: Unknown Intentional Self Injurious Behavior: None Family Suicide History: No Recent stressful life event(s): Other (Comment)(Loss of mother) Persecutory voices/beliefs?: Pincus Badder) Depression: (UTA) Depression Symptoms: (UTA) Substance abuse history and/or treatment for substance abuse?: No Suicide prevention information given to non-admitted patients: Not applicable  Risk to Others  within the past 6 months Homicidal Ideation: No Does patient have any lifetime risk of violence toward others beyond the six months prior to admission? : No Thoughts of Harm to Others: No Current Homicidal Intent: No Current Homicidal Plan: No Access to Homicidal Means: No Identified Victim: NA History of harm to others?: No Assessment of Violence: None Noted Violent Behavior Description: NA Does patient have access to weapons?: No Criminal Charges Pending?: No Does patient have a court date: No Is patient on probation?: No  Psychosis Hallucinations: (UTA) Delusions: (UTA)  Mental Status Report Appearance/Hygiene: In hospital gown Eye Contact: Unable to Assess Motor Activity: Agitation Speech: Word salad Level of Consciousness: Restless, Combative Mood: Anxious Affect: Threatening Anxiety Level: (UTA) Thought Processes: Unable to Assess Judgement: Unable to Assess Orientation: Unable to assess Obsessive Compulsive Thoughts/Behaviors: Unable to Assess  Cognitive Functioning Concentration: Unable to Assess Memory: Unable to Assess Is patient IDD: No Insight: Unable to Assess Impulse Control: Unable to Assess Appetite: (UTA) Have you had any weight changes? : (UTA) Sleep: (UTA) Total Hours of Sleep: (UTA) Vegetative Symptoms: (UTA)  ADLScreening [facility] Assessment Services) Patient's cognitive ability adequate to safely complete daily activities?: Yes Patient able to express need for assistance with ADLs?: Yes Independently performs ADLs?: Yes (appropriate for developmental age)  Prior Inpatient Therapy Prior Inpatient Therapy: Yes Prior Therapy Dates: 2019 Prior Therapy Facilty/Provider(s): [facility] Reason for Treatment: MH issues  Prior Outpatient Therapy Prior Outpatient Therapy: Yes Prior Therapy Dates: Ongoing Prior Therapy Facilty/Provider(s): Arfeen MD Reason for  Treatment: Med mang Does patient have an ACCT team?: No Does patient have Intensive In-House  Services?  : No Does patient have [facility] services? : No Does patient have P4CC services?: No  ADL Screening (condition at time of admission) Patient's cognitive ability adequate to safely complete daily activities?: Yes Is the patient deaf or have difficulty hearing?: No Does the patient have difficulty seeing, even when wearing glasses/contacts?: No Does the patient have difficulty concentrating, remembering, or making decisions?: Yes Patient able to express need for assistance with ADLs?: Yes Does the patient have difficulty dressing or bathing?: No Independently performs ADLs?: Yes (appropriate for developmental age) Does the patient have difficulty walking or climbing stairs?: No Weakness of Legs: None Weakness of Arms/Hands: None  Home Assistive Devices/Equipment Home Assistive Devices/Equipment: None  Therapy Consults (therapy consults require a physician order) PT Evaluation Needed: No OT Evalulation Needed: No SLP Evaluation Needed: No Abuse/Neglect Assessment (Assessment to be complete while patient is alone) Physical Abuse: Denies Verbal Abuse: Denies Sexual Abuse: Denies Exploitation of patient/patient's resources: Denies Self-Neglect: Denies Values / Beliefs Cultural Requests During Hospitalization: None Spiritual Requests During Hospitalization: None Consults Spiritual Care Consult Needed: No Social Work Consult Needed: No Regulatory affairs officer (For Healthcare) Does Patient Have a Medical Advance Directive?: No Would patient like information on creating a medical advance directive?: No - Patient declined          Disposition: Case was staffed with Romilda Garret FNP who recommended patient be observed and monitored for safety.   Disposition Initial Assessment Completed for this Encounter: Yes Disposition of Patient: (Observe and monitor) Patient refused recommended treatment: No Mode of transportation if patient is discharged?: (Unk)  On Site Evaluation by:    Reviewed with Physician:    Mamie Nick 01/13/2018 4:47 PM

## 2018-01-13 NOTE — ED Notes (Signed)
Pt became very agitated when we attempted to change into paper scrubs. MD gave orders for medication.

## 2018-01-14 DIAGNOSIS — F22 Delusional disorders: Secondary | ICD-10-CM

## 2018-01-14 DIAGNOSIS — F201 Disorganized schizophrenia: Secondary | ICD-10-CM

## 2018-01-14 LAB — URINALYSIS, ROUTINE W REFLEX MICROSCOPIC
BACTERIA UA: NONE SEEN
Bilirubin Urine: NEGATIVE
GLUCOSE, UA: NEGATIVE mg/dL
HGB URINE DIPSTICK: NEGATIVE
Ketones, ur: NEGATIVE mg/dL
Leukocytes, UA: NEGATIVE
Nitrite: NEGATIVE
PROTEIN: 100 mg/dL — AB
Specific Gravity, Urine: 1.009 (ref 1.005–1.030)
pH: 6 (ref 5.0–8.0)

## 2018-01-14 LAB — RAPID URINE DRUG SCREEN, HOSP PERFORMED
Amphetamines: NOT DETECTED
BARBITURATES: NOT DETECTED
Benzodiazepines: NOT DETECTED
COCAINE: NOT DETECTED
Opiates: NOT DETECTED
TETRAHYDROCANNABINOL: POSITIVE — AB

## 2018-01-14 LAB — CBG MONITORING, ED
GLUCOSE-CAPILLARY: 111 mg/dL — AB (ref 70–99)
Glucose-Capillary: 120 mg/dL — ABNORMAL HIGH (ref 70–99)

## 2018-01-14 MED ORDER — REPAGLINIDE 2 MG PO TABS
2.0000 mg | ORAL_TABLET | Freq: Two times a day (BID) | ORAL | Status: DC
  Filled 2018-01-14: qty 1

## 2018-01-14 MED ORDER — ASPIRIN EC 81 MG PO TBEC
81.0000 mg | DELAYED_RELEASE_TABLET | Freq: Every day | ORAL | Status: DC
  Administered 2018-01-14: 81 mg via ORAL
  Filled 2018-01-14: qty 1

## 2018-01-14 MED ORDER — VITAMIN D (ERGOCALCIFEROL) 1.25 MG (50000 UNIT) PO CAPS
50000.0000 [IU] | ORAL_CAPSULE | ORAL | Status: DC
  Administered 2018-01-14: 50000 [IU] via ORAL
  Filled 2018-01-14: qty 1

## 2018-01-14 MED ORDER — SIMVASTATIN 40 MG PO TABS
40.0000 mg | ORAL_TABLET | Freq: Every day | ORAL | Status: DC
  Filled 2018-01-14: qty 1

## 2018-01-14 MED ORDER — INSULIN GLARGINE 100 UNIT/ML ~~LOC~~ SOLN
60.0000 [IU] | Freq: Every day | SUBCUTANEOUS | Status: DC
  Administered 2018-01-14: 60 [IU] via SUBCUTANEOUS
  Filled 2018-01-14: qty 0.6

## 2018-01-14 MED ORDER — METOPROLOL TARTRATE 25 MG PO TABS
25.0000 mg | ORAL_TABLET | Freq: Two times a day (BID) | ORAL | Status: DC
  Administered 2018-01-14: 25 mg via ORAL
  Filled 2018-01-14: qty 1

## 2018-01-14 MED ORDER — MOMETASONE FURO-FORMOTEROL FUM 200-5 MCG/ACT IN AERO
2.0000 | INHALATION_SPRAY | Freq: Two times a day (BID) | RESPIRATORY_TRACT | Status: DC
  Filled 2018-01-14: qty 8.8

## 2018-01-14 NOTE — Progress Notes (Addendum)
[name] [name] received IVC paperwork and is ready to accept patient.   CSW contacted non-emergency PD for sheriff transportation and left voicemail for sheriff IVC transportation [phone]).  CSW signing off.  Stephanie Acre, [place] Social Worker [phone]

## 2018-01-14 NOTE — ED Notes (Signed)
Bed: WA28 Expected date:  Expected time:  Means of arrival:  Comments: 

## 2018-01-14 NOTE — Progress Notes (Addendum)
This patient continues to meet inpatient criteria. ED psychiatrist recommending inpatient admission and patient to be IVC'd. CSW faxed information to the following facilities:   [facility] does not have 500 hall beds today.  [facility] - has beds today, pt under review [facility]  Stephanie Acre, [place] Social Worker [phone]

## 2018-01-14 NOTE — Progress Notes (Signed)
01/14/18  1100 Report given to [facility] at [facility].

## 2018-01-14 NOTE — Consult Note (Addendum)
[facility] Consult   Reason for Consult:  Psychosis  Referring Physician:  EDP Patient Identification: [name] [name]. MRN:  [id number] Principal Diagnosis: Schizophrenia, disorganized type [facility]) Diagnosis:   Patient Active Problem List   Diagnosis Date Noted  . Schizophrenia, disorganized type ([place]) [F20.1] 06/02/2015    Priority: High  . Schizophrenia, paranoid type ([place]) [F20.0] 11/18/2017  . DKA (diabetic ketoacidoses) ([place]) [E11.10] 11/05/2017  . Acute renal failure superimposed on stage 3 chronic kidney disease ([place]) [N17.9, N18.3] 11/05/2017  . Type II diabetes mellitus with renal manifestations ([place]) [E11.29] 11/05/2017  . Depression [F32.9] 11/05/2017  . Leukocytosis [D72.829] 11/05/2017  . Cough [R05] 11/05/2017  . Tobacco abuse [Z72.0] 11/05/2017  . Schizoaffective disorder, bipolar type ([place]) [F25.0] 06/10/2016  . DM (diabetes mellitus) type II uncontrolled, periph vascular disorder ([place]) [E11.51, E11.65] 02/29/2016  . Hyperlipidemia [E78.5] 09/23/2014  . Renal insufficiency [N28.9] 09/23/2014  . HTN (hypertension) [I10] 03/22/2013  . Seasonal allergies [J30.2] 03/22/2013    Total Time spent with patient: 45 minutes  Subjective:   [name] [name]. is a 52 y.o. male patient presented to ED with active psychosis and a history schizophrenia.  HPI:  On admission:  [name] [name] is a 52 year old male who arrived at the ED 01/13/2018 with his caretaker, who is also his uncle.  Collateral information received from caretaker, patient previously resided with his mother in the local area until she passed away early January 22, 2018.  His uncle was the caregiver for both patient and his mother and allowed the patient to relocate and live with him in [place].  At the time of admission, uncle stated patient was psychotic and began having paranoid delusion, talking nonsensical.  He provided limited insight to patient's behavior.     Patient has a hx for  schizophrenia and takes antipsychotics daily.   His uncle is unsure if patient has used alcohol or drugs exacerbating his current mental health concerns.     On encounter today, patient sitting up in bed.  He is alert to person and aware of psychiatrist as evidenced by extending a handshake.  He is not oriented to time or place and offers little contribution to the assessment.  He demonstrates tangential speech patterns and word salad responses to questions. At times his speech is incoherent.  He is manic and unable to properly communicate his needs.  Collateral information received from night shift nurse, patient required assistance with ADL's. Patient ate some sweet potatoes, fed by nurse.  Also required assistance for bowel/bladder incontinence.    He does  not render a  response when asked about suicidal or homicidal ideations.  He does appear to be responding to internal stimuli with delusions.  His symptoms started when he recently moved to [place] and stopped taking his medications.  His psychosis started shortly afterwards and increased to the point of incoherent with word salad on admission yesterday.  Medications make his psychosis better and not taking them make it worse.    Patient has medication history for IDDM.     Past Psychiatric History:  Schizophrenia, paranoid type  Risk to Self: Suicidal Ideation: (UTA) Suicidal Intent: (UTA) Is patient at risk for suicide?: (UTA) Suicidal Plan?: (UTA) Access to Means: (UTA) What has been your use of drugs/alcohol within the last 12 months?: Past hx of ETOH use How many times?: 0 Other Self Harm Risks: UTA Triggers for Past Attempts: Unknown Intentional Self Injurious Behavior: None Risk to Others: Homicidal Ideation: No Thoughts  of Harm to Others: No Current Homicidal Intent: No Current Homicidal Plan: No Access to Homicidal Means: No Identified Victim: NA History of harm to others?: No Assessment of Violence: None  Noted Violent Behavior Description: NA Does patient have access to weapons?: No Criminal Charges Pending?: No Does patient have a court date: No Prior Inpatient Therapy: Prior Inpatient Therapy: Yes Prior Therapy Dates: 2019 Prior Therapy Facilty/Provider(s): [facility] Reason for Treatment: MH issues Prior Outpatient Therapy: Prior Outpatient Therapy: Yes Prior Therapy Dates: Ongoing Prior Therapy Facilty/Provider(s): [facility] MD Reason for Treatment: Med mang Does patient have an ACCT team?: No Does patient have Intensive In-House Services?  : No Does patient have [facility] services? : No Does patient have P4CC services?: No  Past Medical History:  Past Medical History:  Diagnosis Date  . Diabetes mellitus type II, uncontrolled ([place])   . HTN (hypertension)   . Hyperlipidemia LDL goal <70   . Schizophrenia ([place])    History reviewed. No pertinent surgical history. Family History:  Family History  Problem Relation Age of Onset  . Breast cancer Mother   . Diabetes Mother   . Prostate cancer Father        StepFather  . Hypertension Father   . Breast cancer Maternal Aunt   . Diabetes Maternal Uncle    Family Psychiatric  History:   Social History:  Social History   Substance and Sexual Activity  Alcohol Use Yes   Comment: occasional     Social History   Substance and Sexual Activity  Drug Use Yes  . Types: Marijuana    Social History   Socioeconomic History  . Marital status: Single    Spouse name: Not on file  . Number of children: Not on file  . Years of education: Not on file  . Highest education level: Not on file  Occupational History  . Not on file  Social Needs  . Financial resource strain: Not on file  . Food insecurity:    Worry: Not on file    Inability: Not on file  . Transportation needs:    Medical: Not on file    Non-medical: Not on file  Tobacco Use  . Smoking status: Current Every Day Smoker    Packs/day: 2.00    Years: 30.00    Pack years:  60.00    Types: Cigarettes, Cigars  . Smokeless tobacco: Never Used  Substance and Sexual Activity  . Alcohol use: Yes    Comment: occasional  . Drug use: Yes    Types: Marijuana  . Sexual activity: Never  Lifestyle  . Physical activity:    Days per week: Not on file    Minutes per session: Not on file  . Stress: Not on file  Relationships  . Social connections:    Talks on phone: Not on file    Gets together: Not on file    Attends religious service: Not on file    Active member of club or organization: Not on file    Attends meetings of clubs or organizations: Not on file    Relationship status: Not on file  Other Topics Concern  . Not on file  Social History Narrative   Lives with mom   unemployed   No children   Additional Social History:    Allergies:  No Known Allergies  Labs:  Results for orders placed or performed during the hospital encounter of 01/13/18 (from the past 48 hour(s))  Comprehensive metabolic panel  Status: Abnormal   Collection Time: 01/13/18  1:39 PM  Result Value Ref Range   Sodium 136 135 - 145 mmol/L   Potassium 3.8 3.5 - 5.1 mmol/L   Chloride 105 98 - 111 mmol/L   CO2 20 (L) 22 - 32 mmol/L   Glucose, Bld 162 (H) 70 - 99 mg/dL   BUN 17 6 - 20 mg/dL   Creatinine, Ser 1.97 (H) 0.61 - 1.24 mg/dL   Calcium 9.2 8.9 - 10.3 mg/dL   Total Protein 7.7 6.5 - 8.1 g/dL   Albumin 4.0 3.5 - 5.0 g/dL   AST 33 15 - 41 U/L   ALT 37 0 - 44 U/L   Alkaline Phosphatase 64 38 - 126 U/L   Total Bilirubin 0.4 0.3 - 1.2 mg/dL   GFR calc non Af Amer 38 (L) >60 mL/min   GFR calc Af Amer 44 (L) >60 mL/min    Comment: (NOTE) The eGFR has been calculated using the CKD EPI equation. This calculation has not been validated in all clinical situations. eGFR's persistently <60 mL/min signify possible Chronic Kidney Disease.    Anion gap 11 5 - 15    Comment: Performed at [facility], [place] [address], [place], [place] [address]  Ethanol      Status: None   Collection Time: 01/13/18  1:39 PM  Result Value Ref Range   Alcohol, Ethyl (B) <10 <10 mg/dL    Comment: (NOTE) Lowest detectable limit for serum alcohol is 10 mg/dL. For medical purposes only. Performed at [name], [place] [address], [place], [place] [address]   Salicylate level     Status: None   Collection Time: 01/13/18  1:39 PM  Result Value Ref Range   Salicylate Lvl <1.5 2.8 - 30.0 mg/dL    Comment: Performed at [facility], [place] [address], [place], [place] [address]  Acetaminophen level     Status: Abnormal   Collection Time: 01/13/18  1:39 PM  Result Value Ref Range   Acetaminophen (Tylenol), Serum <10 (L) 10 - 30 ug/mL    Comment: (NOTE) Therapeutic concentrations vary significantly. A range of 10-30 ug/mL  may be an effective concentration for many patients. However, some  are best treated at concentrations outside of this range. Acetaminophen concentrations >150 ug/mL at 4 hours after ingestion  and >50 ug/mL at 12 hours after ingestion are often associated with  toxic reactions. Performed at [facility], [place] [address] [place], [place] [address]   cbc     Status: Abnormal   Collection Time: 01/13/18  1:39 PM  Result Value Ref Range   WBC 14.9 (H) 4.0 - 10.5 K/uL   RBC 5.15 4.22 - 5.81 MIL/uL   Hemoglobin 14.7 13.0 - 17.0 g/dL   HCT 46.8 39.0 - 52.0 %   MCV 90.9 80.0 - 100.0 fL   MCH 28.5 26.0 - 34.0 pg   MCHC 31.4 30.0 - 36.0 g/dL   RDW 14.0 11.5 - 15.5 %   Platelets 466 (H) 150 - 400 K/uL   nRBC 0.0 0.0 - 0.2 %    Comment: Performed at [facility], [place] [address], [place], [place] [address]  Rapid urine drug screen (hospital performed)     Status: Abnormal   Collection Time: 01/14/18  8:01 AM  Result Value Ref Range   Opiates NONE DETECTED NONE DETECTED   Cocaine NONE DETECTED NONE DETECTED   Benzodiazepines NONE DETECTED NONE DETECTED  Amphetamines NONE  DETECTED NONE DETECTED   Tetrahydrocannabinol POSITIVE (A) NONE DETECTED   Barbiturates NONE DETECTED NONE DETECTED    Comment: (NOTE) DRUG SCREEN FOR MEDICAL PURPOSES ONLY.  IF CONFIRMATION IS NEEDED FOR ANY PURPOSE, NOTIFY LAB WITHIN 5 DAYS. LOWEST DETECTABLE LIMITS FOR URINE DRUG SCREEN Drug Class                     Cutoff (ng/mL) Amphetamine and metabolites    1000 Barbiturate and metabolites    200 Benzodiazepine                 902 Tricyclics and metabolites     300 Opiates and metabolites        300 Cocaine and metabolites        300 THC                            50 Performed at [facility], [place] [address], [place], [place] [address]   Urinalysis, Routine w reflex microscopic     Status: Abnormal   Collection Time: 01/14/18  8:01 AM  Result Value Ref Range   Color, Urine YELLOW YELLOW   APPearance CLEAR CLEAR   Specific Gravity, Urine 1.009 1.005 - 1.030   pH 6.0 5.0 - 8.0   Glucose, UA NEGATIVE NEGATIVE mg/dL   Hgb urine dipstick NEGATIVE NEGATIVE   Bilirubin Urine NEGATIVE NEGATIVE   Ketones, ur NEGATIVE NEGATIVE mg/dL   Protein, ur 100 (A) NEGATIVE mg/dL   Nitrite NEGATIVE NEGATIVE   Leukocytes, UA NEGATIVE NEGATIVE   RBC / HPF 0-5 0 - 5 RBC/hpf   WBC, UA 0-5 0 - 5 WBC/hpf   Bacteria, UA NONE SEEN NONE SEEN    Comment: Performed at [facility], [place] [address], [place], [place] [address]    Current Facility-Administered Medications  Medication Dose Route Frequency Provider Last Rate Last Dose  . aspirin EC tablet 81 mg  81 mg Oral Daily Ambrose Finland, MD      . hydrOXYzine (ATARAX/VISTARIL) tablet 25 mg  25 mg Oral TID PRN Ethelene Hal, NP   25 mg at 01/13/18 1644  . insulin glargine (LANTUS) injection 60 Units  60 Units Subcutaneous Daily Ambrose Finland, MD      . metoprolol tartrate (LOPRESSOR) tablet 25 mg  25 mg Oral BID Ambrose Finland, MD      . mometasone-formoterol (DULERA)  200-5 MCG/ACT inhaler 2 puff  2 puff Inhalation BID Ambrose Finland, MD      . repaglinide (PRANDIN) tablet 2 mg  2 mg Oral BID AC Ambrose Finland, MD      . risperiDONE (RISPERDAL) tablet 1 mg  1 mg Oral Daily Ethelene Hal, NP   1 mg at 01/14/18 0935  . risperiDONE (RISPERDAL) tablet 3 mg  3 mg Oral QHS Ethelene Hal, NP   3 mg at 01/13/18 2143  . simvastatin (ZOCOR) tablet 40 mg  40 mg Oral QHS Ambrose Finland, MD      . traZODone (DESYREL) tablet 50 mg  50 mg Oral QHS Ethelene Hal, NP   50 mg at 01/13/18 2143  . Vitamin D (Ergocalciferol) (DRISDOL) capsule 50,000 Units  50,000 Units Oral Weekly Ambrose Finland, MD       Current Outpatient Medications  Medication Sig Dispense Refill  . aspirin EC 81 MG tablet Take 1 tablet (81 mg total) by mouth daily. For heart  health 30 tablet 0  . Fluticasone-Salmeterol (WIXELA INHUB) 250-50 MCG/DOSE AEPB Inhale 1 puff into the lungs 2 (two) times daily. (Patient taking differently: Inhale 1 puff into the lungs 2 (two) times daily as needed (For shortness of breath.). ) 60 each 5  . hydrOXYzine (ATARAX/VISTARIL) 25 MG tablet Take 1 tablet (25 mg total) by mouth 3 (three) times daily as needed for anxiety. 60 tablet 0  . insulin glargine (LANTUS) 100 UNIT/ML injection Inject 0.6 mLs (60 Units total) into the skin daily. For diabetes management 10 mL 11  . metoprolol tartrate (LOPRESSOR) 25 MG tablet Take 1 tablet (25 mg total) by mouth 2 (two) times daily. For high blood pressure 10 tablet 0  . mirtazapine (REMERON) 30 MG tablet Take 30 mg by mouth at bedtime.  2  . repaglinide (PRANDIN) 2 MG tablet Take 1 tablet (2 mg total) by mouth 2 (two) times daily before a meal. For diabetes management 10 tablet 0  . risperiDONE (RISPERDAL) 1 MG tablet Take 1 tablet (1 mg total) by mouth daily with breakfast. For mood control 30 tablet 0  . risperiDONE (RISPERDAL) 3 MG tablet Take 1 tablet (3 mg total) by  mouth at bedtime. For mood control 30 tablet 0  . simvastatin (ZOCOR) 40 MG tablet Take 1 tablet (40 mg total) by mouth at bedtime. For high cholesterol 30 tablet 0  . traZODone (DESYREL) 50 MG tablet Take 1 tablet (50 mg total) by mouth at bedtime as needed for sleep. 30 tablet 0  . Vitamin D, Ergocalciferol, (DRISDOL) 50000 units CAPS capsule Take 1 capsule (50,000 Units total) by mouth once a week. For bone health (Patient taking differently: Take 50,000 Units by mouth every Monday. For bone health) 1 capsule 0  . mirtazapine (REMERON) 7.5 MG tablet Take 1 tablet (7.5 mg total) by mouth at bedtime. Sleep (Patient not taking: Reported on 01/13/2018) 30 tablet 0    Musculoskeletal: Strength & Muscle Tone: within normal limits Gait & Station: normal Patient leans: N/A  Psychiatric Specialty Exam: Physical Exam  Nursing note and vitals reviewed. Constitutional: He is oriented to person, place, and time. He appears well-developed and well-nourished.  HENT:  Head: Normocephalic.  Neck: Normal range of motion.  Musculoskeletal: Normal range of motion.  Neurological: He is alert and oriented to person, place, and time.  Psychiatric: His mood appears anxious. His speech is rapid and/or pressured. He is hyperactive and actively hallucinating. Thought content is delusional. Cognition and memory are impaired. He expresses impulsivity and inappropriate judgment.    Review of Systems  Psychiatric/Behavioral: Positive for hallucinations. The patient is nervous/anxious.   All other systems reviewed and are negative.   Blood pressure (!) 142/88, pulse 90, temperature 98.3 F (36.8 C), temperature source Oral, resp. rate 16, SpO2 98 %.There is no height or weight on file to calculate BMI.  General Appearance: Disheveled  Eye Contact:  Fair  Speech:  Garbled  Volume:  Increased  Mood:  Anxious  Affect:  Blunt  Thought Process:  Disorganized, hallucinations, delusions  Orientation:  Other:  alert  and oriented to person  Thought Content:  Tangential  Suicidal Thoughts:  No  Homicidal Thoughts:  No  Memory:  Immediate;   Poor Recent;   Poor Remote;   Poor  Judgement:  Impaired  Insight:  Lacking  Psychomotor Activity:  Increased  Concentration:  Concentration: Poor and Attention Span: Poor  Recall:  Poor  Fund of Knowledge:  Poor  Language:  Fair  Akathisia:  NA  Handed:  Right  AIMS (if indicated):     Assets:  Desire for Improvement Resilience Social Support  ADL's:  Impaired  Cognition:  Impaired,  Moderate  Sleep:   impaired     Treatment Plan Summary: Daily contact with patient to assess and evaluate symptoms and progress in treatment, Medication management and Plan Admit to [facility] for treatment  -Schizophrenia, disorganized type -Started Risperdal 1 mg in the am and 3 mg in the pm -Started Trazodone 50 mg at bedtime PRN sleep  Transfer to [facility]  Disposition: Recommend psychiatric Inpatient admission when medically cleared. Supportive therapy provided about ongoing stressors.  Waylan Boga, NP 01/14/2018 11:04 AM   Patient seen face to face for this evaluation, case discussed with treatment team and physician extender and formulated treatment plan. Reviewed the information documented and agree with the treatment plan.  Ambrose Finland, MD 01/14/2018

## 2018-01-14 NOTE — ED Notes (Signed)
Pt discharged to [facility]. Left unit ambulatory accompanied by 2 offers from the [facility] department. Left in stable condition.

## 2018-01-14 NOTE — Progress Notes (Addendum)
[name] [name] will accept patient once IVC is received back from magistrate.   Patient bed/unit: [facility]  Accepting provider: Dr. Reece Levy  RN Call for report: [phone]  CSW to fax completed IVC to [facility] At [phone]  Of note, patient's legal guardian was his mother who passed away. CSW informed patient's caregiver [name] [phone]) and sister [name] [phone]) of disposition.   Stephanie Acre, [place] Social Worker [phone]

## 2018-01-14 NOTE — Progress Notes (Signed)
Call received from [facility] reporting that nurse did not receive report on pt, per intake department. According to [facility], she had already given report. This Probation officer then provided a brief report to nurse Estill Bamberg at [facility] to include all the meds that pt had taken today. All RN's questions answered to her satisfaction.  Hale Bogus.

## 2018-01-14 NOTE — Progress Notes (Signed)
Sheriff will pick up patient at 5pm to transport to [facility].  Stephanie Acre, [place] Social Worker [phone]

## 2018-01-14 NOTE — Progress Notes (Signed)
01/14/18  [place] [phone] [facility] patient is ready to be transported [facility]. Waiting for call back from sheriff office to arrange transportation.

## 2018-01-14 NOTE — Progress Notes (Signed)
[facility] called back CSW. They are able to transport patient, but likely not until late this evening. Will call back with a more specific time later today.  Stephanie Acre, [place] Social Worker [phone]

## 2018-02-02 ENCOUNTER — Ambulatory Visit (INDEPENDENT_AMBULATORY_CARE_PROVIDER_SITE_OTHER): Payer: Medicare HMO | Admitting: Family Medicine

## 2018-02-02 ENCOUNTER — Encounter: Payer: Self-pay | Admitting: Family Medicine

## 2018-02-02 VITALS — BP 116/62 | HR 107 | Temp 99.1°F | Resp 16 | Ht 70.0 in | Wt 250.0 lb

## 2018-02-02 DIAGNOSIS — F201 Disorganized schizophrenia: Secondary | ICD-10-CM

## 2018-02-02 DIAGNOSIS — E1165 Type 2 diabetes mellitus with hyperglycemia: Secondary | ICD-10-CM

## 2018-02-02 DIAGNOSIS — H6121 Impacted cerumen, right ear: Secondary | ICD-10-CM

## 2018-02-02 DIAGNOSIS — E1151 Type 2 diabetes mellitus with diabetic peripheral angiopathy without gangrene: Secondary | ICD-10-CM | POA: Diagnosis not present

## 2018-02-02 DIAGNOSIS — Z23 Encounter for immunization: Secondary | ICD-10-CM | POA: Diagnosis not present

## 2018-02-02 DIAGNOSIS — IMO0002 Reserved for concepts with insufficient information to code with codable children: Secondary | ICD-10-CM

## 2018-02-02 DIAGNOSIS — F209 Schizophrenia, unspecified: Secondary | ICD-10-CM

## 2018-02-02 MED ORDER — BLOOD GLUCOSE MONITOR KIT: PACK | 0 refills | Status: DC

## 2018-02-02 MED ORDER — CENTRUM PO CHEW: 1.0000 | CHEWABLE_TABLET | Freq: Every day | ORAL | Status: AC

## 2018-02-02 MED ORDER — METFORMIN HCL 500 MG PO TABS: 500.0000 mg | ORAL_TABLET | Freq: Two times a day (BID) | ORAL | 3 refills | Status: DC

## 2018-02-02 NOTE — Assessment & Plan Note (Signed)
F/u psych Refill med-- to last until psych app

## 2018-02-02 NOTE — Assessment & Plan Note (Signed)
Use debrox prn No q tips

## 2018-02-02 NOTE — Progress Notes (Signed)
Patient ID: [name] Castell., male    DOB: 1965-04-10  Age: 52 y.o. MRN: [id number]    Subjective:  Subjective  HPI [name] [name]. presents for f/u [facility] health.  He was in [place] nov[place]-- 65---  He is living with family friend.  His mom died last month.    bs ---  69-90s     DIABETES    Blood Sugar ranges-69-90s  Polyuria- no New Visual problems- no  Hypoglycemic symptoms- no  Other side effects-no Medication compliance - good Last eye exam- due    HYPERLIPIDEMIA  Medication compliance- good  RUQ pain- no  Muscle aches- no Other side effects-no        Review of Systems  Constitutional: Negative for chills and fever.  HENT: Negative for congestion and hearing loss.   Eyes: Negative for discharge.  Respiratory: Negative for cough and shortness of breath.   Cardiovascular: Negative for chest pain, palpitations and leg swelling.  Gastrointestinal: Negative for abdominal pain, blood in stool, constipation, diarrhea, nausea and vomiting.  Genitourinary: Negative for dysuria, frequency, hematuria and urgency.  Musculoskeletal: Negative for back pain and myalgias.  Skin: Negative for rash.  Allergic/Immunologic: Negative for environmental allergies.  Neurological: Negative for dizziness, weakness and headaches.  Hematological: Does not bruise/bleed easily.  Psychiatric/Behavioral: Negative for suicidal ideas. The patient is not nervous/anxious.     History Past Medical History:  Diagnosis Date  . Diabetes mellitus type II, uncontrolled ([place])   . HTN (hypertension)   . Hyperlipidemia LDL goal <70   . Schizophrenia [facility])     He has no past surgical history on file.   His family history includes Breast cancer in his maternal aunt and mother; Diabetes in his maternal uncle and mother; Hypertension in his father; Prostate cancer in his father.He reports that he has been smoking cigarettes and cigars. He has a 60.00 pack-year smoking history. He has never  used smokeless tobacco. He reports that he drinks alcohol. He reports that he has current or past drug history. Drug: Marijuana.  Current Outpatient Medications on File Prior to Visit  Medication Sig Dispense Refill  . aspirin EC 81 MG tablet Take 1 tablet (81 mg total) by mouth daily. For heart health 30 tablet 0  . Fluticasone-Salmeterol (WIXELA INHUB) 250-50 MCG/DOSE AEPB Inhale 1 puff into the lungs 2 (two) times daily. 60 each 5  . insulin glargine (LANTUS) 100 UNIT/ML injection Inject 0.6 mLs (60 Units total) into the skin daily. For diabetes management 10 mL 11  . metoprolol tartrate (LOPRESSOR) 25 MG tablet Take 1 tablet (25 mg total) by mouth 2 (two) times daily. For high blood pressure 10 tablet 0  . simvastatin (ZOCOR) 40 MG tablet Take 1 tablet (40 mg total) by mouth at bedtime. For high cholesterol 30 tablet 0  . divalproex (DEPAKOTE ER) 500 MG 24 hr tablet 1po bid 60 tablet 0  . traZODone (DESYREL) 50 MG tablet Take 1 tablet (50 mg total) by mouth at bedtime as needed for sleep. (Patient not taking: Reported on 02/02/2018) 30 tablet 0  . Vitamin D, Ergocalciferol, (DRISDOL) 50000 units CAPS capsule Take 1 capsule (50,000 Units total) by mouth once a week. For bone health (Patient not taking: Reported on 02/02/2018) 1 capsule 0   No current facility-administered medications on file prior to visit.      Objective:  Objective  Physical Exam  Constitutional: He is oriented to person, place, and time. Vital signs are normal. He appears well-developed and  well-nourished. He is sleeping.  HENT:  Head: Normocephalic and atraumatic.  Right Ear: Decreased hearing is noted.  Left Ear: Hearing, tympanic membrane, external ear and ear canal normal.  Mouth/Throat: Oropharynx is clear and moist.  r ear -- + cerumen impaction -- irrigated successfully =-- unable to use hoop TmI    Eyes: Pupils are equal, round, and reactive to light. EOM are normal.  Neck: Normal range of motion. Neck  supple. No thyromegaly present.  Cardiovascular: Normal rate and regular rhythm.  No murmur heard. Pulmonary/Chest: Effort normal and breath sounds normal. No respiratory distress. He has no wheezes. He has no rales. He exhibits no tenderness.  Musculoskeletal: He exhibits no edema or tenderness.  Neurological: He is alert and oriented to person, place, and time.  Skin: Skin is warm and dry.  Psychiatric: He has a normal mood and affect. His behavior is normal. Judgment and thought content normal.  Nursing note and vitals reviewed.  BP 116/62 (BP Location: Right Arm, Cuff Size: Large)   Pulse (!) 107   Temp 99.1 F (37.3 C) (Oral)   Resp 16   Ht 5' 10" (1.778 m)   Wt 250 lb (113.4 kg)   SpO2 97%   BMI 35.87 kg/m  Wt Readings from Last 3 Encounters:  02/02/18 250 lb (113.4 kg)  11/17/17 215 lb (97.5 kg)  11/15/17 215 lb (97.5 kg)     Lab Results  Component Value Date   WBC 14.9 (H) 01/13/2018   HGB 14.7 01/13/2018   HCT 46.8 01/13/2018   PLT 466 (H) 01/13/2018   GLUCOSE 162 (H) 01/13/2018   CHOL 116 11/20/2017   TRIG 108 11/20/2017   HDL 41 11/20/2017   LDLDIRECT 133.0 05/27/2016   LDLCALC 53 11/20/2017   ALT 37 01/13/2018   AST 33 01/13/2018   NA 136 01/13/2018   K 3.8 01/13/2018   CL 105 01/13/2018   CREATININE 1.97 (H) 01/13/2018   BUN 17 01/13/2018   CO2 20 (L) 01/13/2018   TSH 1.855 11/19/2017   PSA 0.5 09/30/2017   HGBA1C 10.8 (H) 11/20/2017   MICROALBUR 34.8 09/30/2017    Dg Chest 2 View  Result Date: 01/13/2018 CLINICAL DATA:  Acting manic since yesterday, history of paranoid schizophrenia, hypertension, diabetes mellitus EXAM: CHEST - 2 VIEW COMPARISON:  11/05/2017 FINDINGS: Normal heart size, mediastinal contours, and pulmonary vascularity. Lungs clear. No infiltrate, pleural effusion or pneumothorax. Multiple old RIGHT rib fractures posterolaterally. No acute osseous findings. IMPRESSION: No acute abnormalities. Electronically Signed   By: Lavonia Dana  M.D.   On: 01/13/2018 15:59     Assessment & Plan:  Plan  I have discontinued [name] Munda Jr.'s mirtazapine, repaglinide, risperiDONE, risperiDONE, hydrOXYzine, and mirtazapine. I am also having him start on multivitamin-iron-minerals-folic acid, metFORMIN, and blood glucose meter kit and supplies. Additionally, I am having him maintain his insulin glargine, metoprolol tartrate, simvastatin, traZODone, Vitamin D (Ergocalciferol), aspirin EC, Fluticasone-Salmeterol, and divalproex.  Meds ordered this encounter  Medications  . multivitamin-iron-minerals-folic acid (CENTRUM) chewable tablet    Sig: Chew 1 tablet by mouth daily.  . metFORMIN (GLUCOPHAGE) 500 MG tablet    Sig: Take 1 tablet (500 mg total) by mouth 2 (two) times daily with a meal.    Dispense:  180 tablet    Refill:  3  . blood glucose meter kit and supplies KIT    Sig: Dispense based on patient and insurance preference. Use up to four times daily as directed. (FOR ICD-9 250.00, 250.01).  Dispense:  1 each    Refill:  0    Order Specific Question:   Number of strips    Answer:   100    Order Specific Question:   Number of lancets    Answer:   100    Problem List Items Addressed This Visit      Unprioritized   DM (diabetes mellitus) type II uncontrolled, periph vascular disorder ([place])    Check labs in 2 months Take meds regularly       Relevant Medications   metFORMIN (GLUCOPHAGE) 500 MG tablet   Impacted cerumen of right ear    Use debrox prn No q tips      Schizophrenia, disorganized type ([place])    F/u psych Refill med-- to last until psych app      Relevant Medications   divalproex (DEPAKOTE ER) 500 MG 24 hr tablet    Other Visit Diagnoses    Influenza vaccine administered    -  Primary   Relevant Orders   Flu Vaccine QUAD 6+ mos PF IM (Fluarix Quad PF) (Completed)   Type 2 diabetes mellitus with hyperglycemia, unspecified whether long term insulin use ([facility])       Relevant Medications    metFORMIN (GLUCOPHAGE) 500 MG tablet   blood glucose meter kit and supplies KIT   Other Relevant Orders   Ambulatory referral to Ophthalmology   Schizophrenia, unspecified type ([place])       Relevant Medications   divalproex (DEPAKOTE ER) 500 MG 24 hr tablet   Need for pneumococcal vaccination       Relevant Orders   Pneumococcal polysaccharide vaccine 23-valent greater than or equal to 2yo subcutaneous/IM (Completed)      Follow-up: Return in about 2 months (around 04/04/2018) for hyperlipidemia, diabetes II.  Ann Held, DO

## 2018-02-02 NOTE — Assessment & Plan Note (Signed)
Check labs in 2 months Take meds regularly

## 2018-02-02 NOTE — Patient Instructions (Signed)

## 2018-02-17 ENCOUNTER — Other Ambulatory Visit (HOSPITAL_COMMUNITY): Payer: Self-pay

## 2018-02-17 ENCOUNTER — Telehealth (HOSPITAL_COMMUNITY): Payer: Self-pay

## 2018-02-17 MED ORDER — RISPERIDONE 2 MG PO TABS: ORAL_TABLET | ORAL | 0 refills | Status: DC

## 2018-02-17 MED ORDER — TRAZODONE HCL 100 MG PO TABS: 100.0000 mg | ORAL_TABLET | Freq: Every evening | ORAL | 0 refills | Status: DC | PRN

## 2018-02-17 MED ORDER — MIRTAZAPINE 30 MG PO TABS: 30.0000 mg | ORAL_TABLET | Freq: Every day | ORAL | 0 refills | Status: DC

## 2018-02-17 NOTE — Telephone Encounter (Signed)
Patients guardian contacted, I advised him that I sent in all the previous prescriptions to the pharmacy, he will bring the discharge papers from [facility]

## 2018-02-17 NOTE — Telephone Encounter (Signed)
Need to go back on his original medication which he was taking in June.  We need to get records from [facility] if they have started him on long acting antipsychotic injections.

## 2018-02-17 NOTE — Telephone Encounter (Signed)
Patients guardian called, he is concerned that the shot of Lorayne Bender has worn off. Patient is not going to get another as he can not afford the copay of 600 a month. The plan was for him to go back on his medication now that he is back with his guardian and his meds are monitered. Patient was previously on Risperdal 2 mg q am and 4 mg q noon, Remeron 30 mg qhs and Trazodone 100 mg. Please review and advise, thank you

## 2018-02-24 ENCOUNTER — Ambulatory Visit (HOSPITAL_COMMUNITY): Payer: Medicare HMO

## 2018-03-03 ENCOUNTER — Encounter (HOSPITAL_COMMUNITY): Payer: Self-pay | Admitting: Psychiatry

## 2018-03-03 ENCOUNTER — Encounter

## 2018-03-03 ENCOUNTER — Ambulatory Visit (INDEPENDENT_AMBULATORY_CARE_PROVIDER_SITE_OTHER): Payer: Medicare HMO | Admitting: Psychiatry

## 2018-03-03 VITALS — BP 123/78 | HR 105 | Ht 70.0 in | Wt 245.0 lb

## 2018-03-03 DIAGNOSIS — F419 Anxiety disorder, unspecified: Secondary | ICD-10-CM | POA: Diagnosis not present

## 2018-03-03 DIAGNOSIS — F200 Paranoid schizophrenia: Secondary | ICD-10-CM

## 2018-03-03 MED ORDER — TRAZODONE HCL 100 MG PO TABS: 100.0000 mg | ORAL_TABLET | Freq: Every evening | ORAL | 0 refills | Status: DC | PRN

## 2018-03-03 MED ORDER — RISPERIDONE 2 MG PO TABS: ORAL_TABLET | ORAL | 0 refills | Status: DC

## 2018-03-03 MED ORDER — MIRTAZAPINE 30 MG PO TABS: 30.0000 mg | ORAL_TABLET | Freq: Every day | ORAL | 0 refills | Status: DC

## 2018-03-03 NOTE — Progress Notes (Signed)
[name] Bay MD/PA/NP OP Progress Note  03/03/2018 8:25 AM [name] [name].  MRN:  [id number]  Chief Complaint: My mother died in 01-16-2023.  I was not doing good.  HPI: [name] [name] came for his caretaker who also is legal guardian.  Patient was last seen in May and since then he hospitalized twice.  His last admission was at old [facility] in November 2019.  Apparently patient was living with his mother since May but she was unable to supervise his medication.  He started to decompensate and in September he was admitted to behavioral health center because of psychosis and bizarre behavior.  Patient was discharge back to his mother's house however in 2023-01-16 his mother died due to dermal infection.  Patient again decompensated and seen in the emergency room and later transferred to old [facility].  In the hospital he was given Risperdal injection and Depakote as started.  However his insurance did not approve Risperdal constant when he returned to get injection in December earlier this month.  He was put back on oral medication.  Now his legal guardian is taking the full responsibility to supervise his medication.  Patient admitted that he is doing much better.  He is sleeping good.  He is not using any drugs.  He admitted using marijuana but he was living with his mother.  He had fever about losing his mother but he did not appears to be in distress.  He is quite.  He has mild tremors but no other major side effects.  He is still seen some time talking to himself with inappropriate laugh but denies any suicidal thoughts or any homicidal thought.  As per legal guardian he is back to his normal routine.  He is not agitated, aggressive or impulsive.  He is noncompliant with Depakote for past 2 weeks because it was not refilled as he has not seen physician after discharge from old [facility].  He lost 5 pounds.  Patient did not notice any change with addition of Depakote.  He prefers to go back to Risperdal  trazodone and Remeron which he had taken in the past without any problem.  Denies any anxiety or any nervousness.  He appears calm during the session and there were no restlessness noticed.  Visit Diagnosis:    ICD-10-CM   1. Paranoid schizophrenia ([place]) F20.0 mirtazapine (REMERON) 30 MG tablet    risperiDONE (RISPERDAL) 2 MG tablet    traZODone (DESYREL) 100 MG tablet  2. Anxiety F41.9 mirtazapine (REMERON) 30 MG tablet    Past Psychiatric History: Reviewed. History of psychiatric illness with multiple hospitalization.  History of irrational and bizarre behavior.  History of suicidal attempt.  Admitted to [facility], behavioral health center, [facility] and recently old [facility].    History of noncompliance of medication.  In the past took Abilify, Lexapro, Risperdal and Neurontin.  We recommended injectable but his insurance does not approve.    Past Medical History:  Past Medical History:  Diagnosis Date  . Diabetes mellitus type II, uncontrolled ([place])   . HTN (hypertension)   . Hyperlipidemia LDL goal <70   . Schizophrenia ([place])    No past surgical history on file.  Family Psychiatric History: Reviewed.  Family History:  Family History  Problem Relation Age of Onset  . Breast cancer Mother   . Diabetes Mother   . Prostate cancer Father   . Hypertension Father   . Breast cancer Maternal Aunt   . Diabetes Maternal Uncle  Social History:  Social History   Socioeconomic History  . Marital status: Single    Spouse name: Not on file  . Number of children: Not on file  . Years of education: Not on file  . Highest education level: Not on file  Occupational History  . Not on file  Social Needs  . Financial resource strain: Not on file  . Food insecurity:    Worry: Not on file    Inability: Not on file  . Transportation needs:    Medical: Not on file    Non-medical: Not on file  Tobacco Use  . Smoking status: Current Every Day Smoker     Packs/day: 2.00    Years: 30.00    Pack years: 60.00    Types: Cigarettes, Cigars  . Smokeless tobacco: Never Used  Substance and Sexual Activity  . Alcohol use: Yes    Comment: occasional  . Drug use: Yes    Types: Marijuana  . Sexual activity: Never  Lifestyle  . Physical activity:    Days per week: Not on file    Minutes per session: Not on file  . Stress: Not on file  Relationships  . Social connections:    Talks on phone: Not on file    Gets together: Not on file    Attends religious service: Not on file    Active member of club or organization: Not on file    Attends meetings of clubs or organizations: Not on file    Relationship status: Not on file  Other Topics Concern  . Not on file  Social History Narrative   Lives with mom   unemployed   No children    Allergies: No Known Allergies  Metabolic Disorder Labs: Recent Results (from the past 2160 hour(s))  Comprehensive metabolic panel     Status: Abnormal   Collection Time: 01/13/18  1:39 PM  Result Value Ref Range   Sodium 136 135 - 145 mmol/L   Potassium 3.8 3.5 - 5.1 mmol/L   Chloride 105 98 - 111 mmol/L   CO2 20 (L) 22 - 32 mmol/L   Glucose, Bld 162 (H) 70 - 99 mg/dL   BUN 17 6 - 20 mg/dL   Creatinine, Ser 1.97 (H) 0.61 - 1.24 mg/dL   Calcium 9.2 8.9 - 10.3 mg/dL   Total Protein 7.7 6.5 - 8.1 g/dL   Albumin 4.0 3.5 - 5.0 g/dL   AST 33 15 - 41 U/L   ALT 37 0 - 44 U/L   Alkaline Phosphatase 64 38 - 126 U/L   Total Bilirubin 0.4 0.3 - 1.2 mg/dL   GFR calc non Af Amer 38 (L) >60 mL/min   GFR calc Af Amer 44 (L) >60 mL/min    Comment: (NOTE) The eGFR has been calculated using the CKD EPI equation. This calculation has not been validated in all clinical situations. eGFR's persistently <60 mL/min signify possible Chronic Kidney Disease.    Anion gap 11 5 - 15    Comment: Performed at [facility], [place] [address] [place], [place] [address]  Ethanol     Status: None   Collection  Time: 01/13/18  1:39 PM  Result Value Ref Range   Alcohol, Ethyl (B) <10 <10 mg/dL    Comment: (NOTE) Lowest detectable limit for serum alcohol is 10 mg/dL. For medical purposes only. Performed at [facility], [place] [address] [place], [place] [address]   Salicylate level  Status: None   Collection Time: 01/13/18  1:39 PM  Result Value Ref Range   Salicylate Lvl <1.9 2.8 - 30.0 mg/dL    Comment: Performed at [facility], [place] [address] [place], [place] [address]  Acetaminophen level     Status: Abnormal   Collection Time: 01/13/18  1:39 PM  Result Value Ref Range   Acetaminophen (Tylenol), Serum <10 (L) 10 - 30 ug/mL    Comment: (NOTE) Therapeutic concentrations vary significantly. A range of 10-30 ug/mL  may be an effective concentration for many patients. However, some  are best treated at concentrations outside of this range. Acetaminophen concentrations >150 ug/mL at 4 hours after ingestion  and >50 ug/mL at 12 hours after ingestion are often associated with  toxic reactions. Performed at [facility], [place] [address], [place], [place] [address]   cbc     Status: Abnormal   Collection Time: 01/13/18  1:39 PM  Result Value Ref Range   WBC 14.9 (H) 4.0 - 10.5 K/uL   RBC 5.15 4.22 - 5.81 MIL/uL   Hemoglobin 14.7 13.0 - 17.0 g/dL   HCT 46.8 39.0 - 52.0 %   MCV 90.9 80.0 - 100.0 fL   MCH 28.5 26.0 - 34.0 pg   MCHC 31.4 30.0 - 36.0 g/dL   RDW 14.0 11.5 - 15.5 %   Platelets 466 (H) 150 - 400 K/uL   nRBC 0.0 0.0 - 0.2 %    Comment: Performed at [facility], [place] [address], [place], [place] [address]  Rapid urine drug screen (hospital performed)     Status: Abnormal   Collection Time: 01/14/18  8:01 AM  Result Value Ref Range   Opiates NONE DETECTED NONE DETECTED   Cocaine NONE DETECTED NONE DETECTED   Benzodiazepines NONE DETECTED NONE DETECTED   Amphetamines NONE DETECTED NONE DETECTED    Tetrahydrocannabinol POSITIVE (A) NONE DETECTED   Barbiturates NONE DETECTED NONE DETECTED    Comment: (NOTE) DRUG SCREEN FOR MEDICAL PURPOSES ONLY.  IF CONFIRMATION IS NEEDED FOR ANY PURPOSE, NOTIFY LAB WITHIN 5 DAYS. LOWEST DETECTABLE LIMITS FOR URINE DRUG SCREEN Drug Class                     Cutoff (ng/mL) Amphetamine and metabolites    1000 Barbiturate and metabolites    200 Benzodiazepine                 784 Tricyclics and metabolites     300 Opiates and metabolites        300 Cocaine and metabolites        300 THC                            50 Performed at [facility], [place] [address] [place], [place] [address]   Urinalysis, Routine w reflex microscopic     Status: Abnormal   Collection Time: 01/14/18  8:01 AM  Result Value Ref Range   Color, Urine YELLOW YELLOW   APPearance CLEAR CLEAR   Specific Gravity, Urine 1.009 1.005 - 1.030   pH 6.0 5.0 - 8.0   Glucose, UA NEGATIVE NEGATIVE mg/dL   Hgb urine dipstick NEGATIVE NEGATIVE   Bilirubin Urine NEGATIVE NEGATIVE   Ketones, ur NEGATIVE NEGATIVE mg/dL   Protein, ur 100 (A) NEGATIVE mg/dL   Nitrite NEGATIVE NEGATIVE   Leukocytes, UA NEGATIVE NEGATIVE   RBC / HPF  0-5 0 - 5 RBC/hpf   WBC, UA 0-5 0 - 5 WBC/hpf   Bacteria, UA NONE SEEN NONE SEEN    Comment: Performed at [facility], [place] [address] [place],  [address]  CBG monitoring, ED     Status: Abnormal   Collection Time: 01/14/18 12:25 PM  Result Value Ref Range   Glucose-Capillary 111 (H) 70 - 99 mg/dL  CBG monitoring, ED     Status: Abnormal   Collection Time: 01/14/18  4:40 PM  Result Value Ref Range   Glucose-Capillary 120 (H) 70 - 99 mg/dL   Lab Results  Component Value Date   HGBA1C 10.8 (H) 11/20/2017   MPG 263.26 11/20/2017   MPG 278 11/06/2017   Lab Results  Component Value Date   PROLACTIN 63.7 (H) 11/20/2017   Lab Results  Component Value Date   CHOL 116 11/20/2017   TRIG 108 11/20/2017   HDL  41 11/20/2017   CHOLHDL 2.8 11/20/2017   VLDL 22 11/20/2017   LDLCALC 53 11/20/2017   LDLCALC 66 09/30/2017   Lab Results  Component Value Date   TSH 1.855 11/19/2017   TSH 1.58 09/30/2017    Therapeutic Level Labs: No results found for: LITHIUM No results found for: VALPROATE No components found for:  CBMZ  Current Medications: Current Outpatient Medications  Medication Sig Dispense Refill  . aspirin EC 81 MG tablet Take 1 tablet (81 mg total) by mouth daily. For heart health 30 tablet 0  . blood glucose meter kit and supplies KIT Dispense based on patient and insurance preference. Use up to four times daily as directed. (FOR ICD-9 250.00, 250.01). 1 each 0  . divalproex (DEPAKOTE ER) 500 MG 24 hr tablet 1po bid 60 tablet 0  . Fluticasone-Salmeterol (WIXELA INHUB) 250-50 MCG/DOSE AEPB Inhale 1 puff into the lungs 2 (two) times daily. 60 each 5  . insulin glargine (LANTUS) 100 UNIT/ML injection Inject 0.6 mLs (60 Units total) into the skin daily. For diabetes management 10 mL 11  . metFORMIN (GLUCOPHAGE) 500 MG tablet Take 1 tablet (500 mg total) by mouth 2 (two) times daily with a meal. 180 tablet 3  . metoprolol tartrate (LOPRESSOR) 25 MG tablet Take 1 tablet (25 mg total) by mouth 2 (two) times daily. For high blood pressure 10 tablet 0  . mirtazapine (REMERON) 30 MG tablet Take 1 tablet (30 mg total) by mouth at bedtime. 30 tablet 0  . multivitamin-iron-minerals-folic acid (CENTRUM) chewable tablet Chew 1 tablet by mouth daily.    . risperiDONE (RISPERDAL) 2 MG tablet Take 1 tablet (2 mg) qam, take 2 tablets (4 mg) qhs 90 tablet 0  . simvastatin (ZOCOR) 40 MG tablet Take 1 tablet (40 mg total) by mouth at bedtime. For high cholesterol 30 tablet 0  . traZODone (DESYREL) 100 MG tablet Take 1 tablet (100 mg total) by mouth at bedtime as needed for sleep. 30 tablet 0  . Vitamin D, Ergocalciferol, (DRISDOL) 50000 units CAPS capsule Take 1 capsule (50,000 Units total) by mouth once a  week. For bone health (Patient not taking: Reported on 02/02/2018) 1 capsule 0   No current facility-administered medications for this visit.      Musculoskeletal: Strength & Muscle Tone: within normal limits Gait & Station: normal Patient leans: N/A  Psychiatric Specialty Exam: ROS  Blood pressure 123/78, pulse (!) 105, height _0  (1.778 m), weight 245 lb (111.1 kg), SpO2 96 %.There is no height or weight on file to  calculate BMI.  General Appearance: Fairly Groomed, Guarded and Smell of cigarette smoking  Eye Contact:  Poor  Speech:  Slow  Volume:  Decreased  Mood:  Euthymic  Affect:  Labile  Thought Process:  Descriptions of Associations: Circumstantial  Orientation:  Full (Time, Place, and Person)  Thought Content: Rumination   Suicidal Thoughts:  No  Homicidal Thoughts:  No  Memory:  Immediate;   Fair Recent;   Fair Remote;   Fair  Judgement:  Fair  Insight:  Fair  Psychomotor Activity:  Decreased  Concentration:  Concentration: Fair and Attention Span: Fair  Recall:  [name] of Knowledge: Fair  Language: Fair  Akathisia:  No  Handed:  Right  AIMS (if indicated): not done  Assets:  Desire for Improvement Social Support  ADL's:  Intact  Cognition: Impaired,  Mild  Sleep:  Good   Screenings: AIMS     Admission (Discharged) from 11/18/2017 in [facility]  AIMS Total Score  0    AUDIT     Admission (Discharged) from 11/18/2017 in [facility]  Alcohol Use Disorder Identification Test Final Score (AUDIT)  0    PHQ2-9     Office Visit from 09/30/2017 in [facility] at [name]  PHQ-2 Total Score  0       Assessment and Plan: Schizophrenia chronic paranoid type.  Anxiety.   I review records from recent emergency room visit and discharge summary from behavioral health center.  I review current medication and blood work results.  His hemoglobin A1c is high and  his creatinine is 1.97.  I will discontinue Depakote since he is not taking it and he do not notice any changes while he was taking it.  He actually lost 5 pound since he stopped the Depakote.  I discussed with the patient and his legal guardian about chronic renal insufficiency.  Encouraged to keep appointment with his physician because of high creatinine and high blood sugar.  We talked about trigger factor that cause relapse is noncompliant with medication.  It is unfortunate that his insurance does not approve long-acting injectables.  His legal guardian understand that patient has to take the medication and since he is supervising the medication he noticed much improvement in his mood and sleep.  Continue Risperdal 2 mg in the morning and 4 mg at bedtime, Remeron 30 mg at bedtime and trazodone 100 mg at bedtime.  Discussed healthy lifestyle and watch his calorie intake.  Recommended to discontinue smoking.  He has not used marijuana since he is living with his legal guardian.  Recommended to call us back if is any question or any concern.  Follow-up in 3 months. Time spent 25 minutes.  More than 50% of the time spent in psychoeducation, counseling and coordination of care.  Discuss safety plan that anytime having active suicidal thoughts or homicidal thoughts then patient need to call 911 or go to the local emergency room.     Kathlee Nations, MD 03/03/2018, 8:25 AM

## 2018-04-05 NOTE — Progress Notes (Signed)
Subjective:   [name] Ressel. is a 53 y.o. male who presents for Medicare Annual/Subsequent preventive examination.  Here with [name] [name] (friend) whom he lives with.  Review of Systems: No ROS.  Medicare Wellness Visit. Additional risk factors are reflected in the social history.  Cardiac Risk Factors include: advanced age (>93mn, >>60women);dyslipidemia;hypertension;male gender;sedentary lifestyle;smoking/ tobacco exposure;obesity (BMI >30kg/m2) Sleep patterns: pt states he sleeps too much Home Safety/Smoke Alarms: Feels safe in home. Smoke alarms in place.  Lives in 1 story home with [place]   Male:   CCS- will defer to PCP     PSA-  Lab Results  Component Value Date   PSA 0.5 09/30/2017   PSA 0.63 03/22/2013   Eye- yearly eye exam next Thursday.    Objective:    Vitals: BP 120/68 (BP Location: Left Arm, Patient Position: Sitting, Cuff Size: Large)   Pulse 90   Ht 5' 10"  (1.778 m)   Wt 249 lb 6.4 oz (113.1 kg)   SpO2 95%   BMI 35.79 kg/m   Body mass index is 35.79 kg/m.  Advanced Directives 04/06/2018 01/13/2018 01/13/2018 01/13/2018 11/17/2017 11/17/2017 11/17/2017  Does Patient Have a Medical Advance Directive? No No No No Yes No Yes  Type of Advance Directive - - - - - - -  Does patient want to make changes to medical advance directive? - - - - - No - Patient declined No - Patient declined  Copy of [place] Chart? - - - - - - No - copy requested  Would patient like information on creating a medical advance directive? No - Patient declined No - Patient declined No - Patient declined No - Patient declined - No - Patient declined No - Patient declined  Some encounter information is confidential and restricted. Go to Review Flowsheets activity to see all data.    Tobacco Social History   Tobacco Use  Smoking Status Current Every Day Smoker  . Packs/day: 1.25  . Years: 30.00  . Pack years: 37.50  . Types: Cigarettes, Cigars  Smokeless Tobacco  Never Used     Ready to quit: No Counseling given: No   Clinical Intake:  Pain : No/denies pain    Past Medical History:  Diagnosis Date  . Diabetes mellitus type II, uncontrolled ([place]   . HTN (hypertension)   . Hyperlipidemia LDL goal <70   . Schizophrenia ([place]    History reviewed. No pertinent surgical history. Family History  Problem Relation Age of Onset  . Breast cancer Mother   . Diabetes Mother   . Prostate cancer Father   . Hypertension Father   . Breast cancer Maternal Aunt   . Diabetes Maternal Uncle    Social History   Socioeconomic History  . Marital status: Single    Spouse name: Not on file  . Number of children: Not on file  . Years of education: Not on file  . Highest education level: Not on file  Occupational History  . Not on file  Social Needs  . Financial resource strain: Not on file  . Food insecurity:    Worry: Not on file    Inability: Not on file  . Transportation needs:    Medical: Not on file    Non-medical: Not on file  Tobacco Use  . Smoking status: Current Every Day Smoker    Packs/day: 1.25    Years: 30.00    Pack years: 37.50    Types: Cigarettes, Cigars  .  Smokeless tobacco: Never Used  Substance and Sexual Activity  . Alcohol use: Yes    Comment: occasional beer  . Drug use: Yes    Types: Marijuana  . Sexual activity: Never  Lifestyle  . Physical activity:    Days per week: Not on file    Minutes per session: Not on file  . Stress: Not on file  Relationships  . Social connections:    Talks on phone: Not on file    Gets together: Not on file    Attends religious service: Not on file    Active member of club or organization: Not on file    Attends meetings of clubs or organizations: Not on file    Relationship status: Not on file  Other Topics Concern  . Not on file  Social History Narrative   Lives with mom   unemployed   No children    Outpatient Encounter Medications as of 04/06/2018  Medication Sig    . aspirin EC 81 MG tablet Take 1 tablet (81 mg total) by mouth daily. For heart health  . blood glucose meter kit and supplies KIT Dispense based on patient and insurance preference. Use up to four times daily as directed. (FOR ICD-9 250.00, 250.01).  . Fluticasone-Salmeterol (WIXELA INHUB) 250-50 MCG/DOSE AEPB Inhale 1 puff into the lungs 2 (two) times daily.  . insulin glargine (LANTUS) 100 UNIT/ML injection Inject 0.6 mLs (60 Units total) into the skin daily. For diabetes management  . metFORMIN (GLUCOPHAGE) 500 MG tablet Take 1 tablet (500 mg total) by mouth 2 (two) times daily with a meal.  . metoprolol tartrate (LOPRESSOR) 25 MG tablet Take 1 tablet (25 mg total) by mouth 2 (two) times daily. For high blood pressure  . mirtazapine (REMERON) 30 MG tablet Take 1 tablet (30 mg total) by mouth at bedtime.  . multivitamin-iron-minerals-folic acid (CENTRUM) chewable tablet Chew 1 tablet by mouth daily.  . risperiDONE (RISPERDAL) 2 MG tablet Take 1 tablet (2 mg) qam, take 2 tablets (4 mg) qhs  . simvastatin (ZOCOR) 40 MG tablet Take 1 tablet (40 mg total) by mouth at bedtime. For high cholesterol  . traZODone (DESYREL) 100 MG tablet Take 1 tablet (100 mg total) by mouth at bedtime as needed for sleep.  . Vitamin D, Ergocalciferol, (DRISDOL) 50000 units CAPS capsule Take 1 capsule (50,000 Units total) by mouth once a week. For bone health   No facility-administered encounter medications on file as of 04/06/2018.     Activities of Daily Living In your present state of health, do you have any difficulty performing the following activities: 04/06/2018 01/13/2018  Hearing? N N  Vision? N N  Difficulty concentrating or making decisions? N Y  Walking or climbing stairs? N N  Dressing or bathing? N N  Doing errands, shopping? N -  Preparing Food and eating ? N -  Using the Toilet? N -  In the past six months, have you accidently leaked urine? N -  Do you have problems with loss of bowel control? N -   Managing your Medications? Y -  Managing your Finances? Y -  Housekeeping or managing your Housekeeping? Y -  Some encounter information is confidential and restricted. Go to Review Flowsheets activity to see all data.  Some recent data might be hidden    Patient Care Team: Carollee Herter, Alferd Apa, DO as PCP - General (Family Medicine)   Assessment:   This is a routine wellness examination for [place]. Physical  assessment deferred to PCP.  Exercise Activities and Dietary recommendations Current Exercise Habits: The patient does not participate in regular exercise at present, Exercise limited by: None identified Diet (meal preparation, eat out, water intake, caffeinated beverages, dairy products, fruits and vegetables):  Breakfast: eggs, grits, toast Lunch: skips Dinner:   [employer]    . DIET - DECREASE SODA OR JUICE INTAKE       Fall Risk Fall Risk  04/06/2018 09/30/2017  Falls in the past year? 0 No   Depression Screen PHQ 2/9 Scores 04/06/2018 09/30/2017  PHQ - 2 Score 0 0    Cognitive Function MMSE - Mini Mental State Exam 04/06/2018  Orientation to time 5  Orientation to Place 5  Registration 3  Attention/ Calculation 4  Recall 3  Language- name 2 objects 2  Language- repeat 1  Language- follow 3 step command 3  Language- read & follow direction 1  Write a sentence 1        Immunization History  Administered Date(s) Administered  . Influenza,inj,Quad PF,6+ Mos 01/19/2016, 02/02/2018  . Pneumococcal Polysaccharide-23 02/02/2018   Screening Tests Health Maintenance  Topic Date Due  . OPHTHALMOLOGY EXAM  11/16/1977  . TETANUS/TDAP  11/17/1986  . COLONOSCOPY  11/16/2017  . HEMOGLOBIN A1C  05/21/2018  . FOOT EXAM  10/01/2018  . URINE MICROALBUMIN  10/01/2018  . INFLUENZA VACCINE  Completed  . PNEUMOCOCCAL POLYSACCHARIDE VACCINE AGE 2-64 HIGH RISK  Completed  . HIV Screening  Completed        Plan:   Follow up with Dr.Lowne directly after this  appointment  Please schedule your next medicare wellness visit with me in 1 yr.  Continue doing brain stimulating activities (puzzles, reading, adult coloring books, staying active) to keep memory sharp.   Continue to eat heart healthy diet (full of fruits, vegetables, whole grains, lean protein, water--limit salt, fat, and sugar intake) and increase physical activity as tolerated.    I have personally reviewed and noted the following in the patient's chart:   . Medical and social history . Use of alcohol, tobacco or illicit drugs  . Current medications and supplements . Functional ability and status . Nutritional status . Physical activity . Advanced directives . List of other physicians . Hospitalizations, surgeries, and ER visits in previous 12 months . Vitals . Screenings to include cognitive, depression, and falls . Referrals and appointments  In addition, I have reviewed and discussed with patient certain preventive protocols, quality metrics, and best practice recommendations. A written personalized care plan for preventive services as well as general preventive health recommendations were provided to patient.     Shela Nevin, [place]  04/06/2018

## 2018-04-06 ENCOUNTER — Ambulatory Visit (INDEPENDENT_AMBULATORY_CARE_PROVIDER_SITE_OTHER): Payer: Medicare HMO | Admitting: Family Medicine

## 2018-04-06 ENCOUNTER — Encounter: Payer: Self-pay | Admitting: *Deleted

## 2018-04-06 ENCOUNTER — Ambulatory Visit (INDEPENDENT_AMBULATORY_CARE_PROVIDER_SITE_OTHER): Payer: Medicare HMO | Admitting: *Deleted

## 2018-04-06 ENCOUNTER — Encounter: Payer: Self-pay | Admitting: Family Medicine

## 2018-04-06 VITALS — BP 120/68 | HR 90 | Ht 70.0 in | Wt 249.4 lb

## 2018-04-06 VITALS — BP 120/68 | HR 90 | Temp 98.7°F | Resp 16 | Ht 70.0 in | Wt 249.0 lb

## 2018-04-06 DIAGNOSIS — E1165 Type 2 diabetes mellitus with hyperglycemia: Secondary | ICD-10-CM | POA: Diagnosis not present

## 2018-04-06 DIAGNOSIS — E1169 Type 2 diabetes mellitus with other specified complication: Secondary | ICD-10-CM

## 2018-04-06 DIAGNOSIS — IMO0002 Reserved for concepts with insufficient information to code with codable children: Secondary | ICD-10-CM

## 2018-04-06 DIAGNOSIS — Z0000 Encounter for general adult medical examination without abnormal findings: Secondary | ICD-10-CM

## 2018-04-06 DIAGNOSIS — E1151 Type 2 diabetes mellitus with diabetic peripheral angiopathy without gangrene: Secondary | ICD-10-CM | POA: Diagnosis not present

## 2018-04-06 DIAGNOSIS — E785 Hyperlipidemia, unspecified: Secondary | ICD-10-CM

## 2018-04-06 DIAGNOSIS — F250 Schizoaffective disorder, bipolar type: Secondary | ICD-10-CM

## 2018-04-06 DIAGNOSIS — I10 Essential (primary) hypertension: Secondary | ICD-10-CM

## 2018-04-06 MED ORDER — BLOOD GLUCOSE MONITOR KIT: PACK | 0 refills | Status: AC

## 2018-04-06 NOTE — Patient Instructions (Signed)
Carbohydrate Counting for Diabetes Mellitus, Adult  Carbohydrate counting is a method of keeping track of how many carbohydrates you eat. Eating carbohydrates naturally increases the amount of sugar (glucose) in the blood. Counting how many carbohydrates you eat helps keep your blood glucose within normal limits, which helps you manage your diabetes (diabetes mellitus). It is important to know how many carbohydrates you can safely have in each meal. This is different for every person. A diet and nutrition specialist (registered dietitian) can help you make a meal plan and calculate how many carbohydrates you should have at each meal and snack. Carbohydrates are found in the following foods:  Grains, such as breads and cereals.  Dried beans and soy products.  Starchy vegetables, such as potatoes, peas, and corn.  Fruit and fruit juices.  Milk and yogurt.  Sweets and snack foods, such as cake, cookies, candy, chips, and soft drinks. How do I count carbohydrates? There are two ways to count carbohydrates in food. You can use either of the methods or a combination of both. Reading "Nutrition Facts" on packaged food The "Nutrition Facts" list is included on the labels of almost all packaged foods and beverages in the [place] It includes:  The serving size.  Information about nutrients in each serving, including the grams (g) of carbohydrate per serving. To use the "Nutrition Facts":  Decide how many servings you will have.  Multiply the number of servings by the number of carbohydrates per serving.  The resulting number is the total amount of carbohydrates that you will be having. Learning standard serving sizes of other foods When you eat carbohydrate foods that are not packaged or do not include "Nutrition Facts" on the label, you need to measure the servings in order to count the amount of carbohydrates:  Measure the foods that you will eat with a food scale or measuring cup, if needed.   Decide how many standard-size servings you will eat.  Multiply the number of servings by 15. Most carbohydrate-rich foods have about 15 g of carbohydrates per serving. ? For example, if you eat 8 oz (170 g) of strawberries, you will have eaten 2 servings and 30 g of carbohydrates (2 servings x 15 g = 30 g).  For foods that have more than one food mixed, such as soups and casseroles, you must count the carbohydrates in each food that is included. The following list contains standard serving sizes of common carbohydrate-rich foods. Each of these servings has about 15 g of carbohydrates:   hamburger bun or  English muffin.   oz (15 mL) syrup.   oz (14 g) jelly.  1 slice of bread.  1 six-inch tortilla.  3 oz (85 g) cooked rice or pasta.  4 oz (113 g) cooked dried beans.  4 oz (113 g) starchy vegetable, such as peas, corn, or potatoes.  4 oz (113 g) hot cereal.  4 oz (113 g) mashed potatoes or  of a large baked potato.  4 oz (113 g) canned or frozen fruit.  4 oz (120 mL) fruit juice.  4-6 crackers.  6 chicken nuggets.  6 oz (170 g) unsweetened dry cereal.  6 oz (170 g) plain fat-free yogurt or yogurt sweetened with artificial sweeteners.  8 oz (240 mL) milk.  8 oz (170 g) fresh fruit or one small piece of fruit.  24 oz (680 g) popped popcorn. Example of carbohydrate counting Sample meal  3 oz (85 g) chicken breast.  6 oz (170 g)   brown rice.  4 oz (113 g) corn.  8 oz (240 mL) milk.  8 oz (170 g) strawberries with sugar-free whipped topping. Carbohydrate calculation 1. Identify the foods that contain carbohydrates: ? Rice. ? Corn. ? Milk. ? Strawberries. 2. Calculate how many servings you have of each food: ? 2 servings rice. ? 1 serving corn. ? 1 serving milk. ? 1 serving strawberries. 3. Multiply each number of servings by 15 g: ? 2 servings rice x 15 g = 30 g. ? 1 serving corn x 15 g = 15 g. ? 1 serving milk x 15 g = 15 g. ? 1 serving  strawberries x 15 g = 15 g. 4. Add together all of the amounts to find the total grams of carbohydrates eaten: ? 30 g + 15 g + 15 g + 15 g = 75 g of carbohydrates total. Summary  Carbohydrate counting is a method of keeping track of how many carbohydrates you eat.  Eating carbohydrates naturally increases the amount of sugar (glucose) in the blood.  Counting how many carbohydrates you eat helps keep your blood glucose within normal limits, which helps you manage your diabetes.  A diet and nutrition specialist (registered dietitian) can help you make a meal plan and calculate how many carbohydrates you should have at each meal and snack. This information is not intended to replace advice given to you by your health care provider. Make sure you discuss any questions you have with your health care provider. Document Released: 03/01/2005 Document Revised: 09/08/2016 Document Reviewed: 08/13/2015 Elsevier Interactive Patient Education  2019 Elsevier Inc.  

## 2018-04-06 NOTE — Progress Notes (Signed)
Reviewed  Yvonne R Lowne Chase, DO  

## 2018-04-06 NOTE — Assessment & Plan Note (Signed)
Per psych 

## 2018-04-06 NOTE — Progress Notes (Signed)
Patient ID: [name] [name]., male    DOB: 1965/06/21  Age: 53 y.o. MRN: [id number]    Subjective:  Subjective  HPI [name] [name]. presents for dm , chol and bp.  Caretaker who is a friend of the pt is with him.    HPI HYPERTENSION   Blood pressure range-not checking   Chest pain- no      Dyspnea- no Lightheadedness- no   Edema- no  Other side effects - no Medication compliance: no Low salt diet- yes    DIABETES    Blood Sugar ranges-good per friend-- but they need a new glucometer  Polyuria- no New Visual problems- no  Hypoglycemic symptoms- no  Other side effects-no Medication compliance - good Last eye exam- opth Foot exam- today   HYPERLIPIDEMIA  Medication compliance- good RUQ pain- no  Muscle aches- no Other side effects-no   ROS See HPI above   PMH Smoking Status noted      Review of Systems Review of Systems  Constitutional: Negative for activity change, appetite change and fatigue.  HENT: Negative for hearing loss, congestion, tinnitus and ear discharge.  dentist q41mEyes: Negative for visual disturbance (see optho q1y -- vision corrected to 20/20 with glasses).  Respiratory: Negative for cough, chest tightness and shortness of breath.   Cardiovascular: Negative for chest pain, palpitations and leg swelling.  Gastrointestinal: Negative for abdominal pain, diarrhea, constipation and abdominal distention.  Genitourinary: Negative for urgency, frequency, decreased urine volume and difficulty urinating.  Musculoskeletal: Negative for back pain, arthralgias and gait problem.  Skin: Negative for color change, pallor and rash.  Neurological: Negative for dizziness, light-headedness, numbness and headaches.  Hematological: Negative for adenopathy. Does not bruise/bleed easily.  Psychiatric/Behavioral: Negative for suicidal ideas, confusion, sleep disturbance, self-injury, dysphoric mood, decreased concentration and agitation.       History Past Medical History:  Diagnosis Date  . Diabetes mellitus type II, uncontrolled ([facility]   . HTN (hypertension)   . Hyperlipidemia LDL goal <70   . Schizophrenia ([facility]     He has no past surgical history on file.   His family history includes Breast cancer in his maternal aunt and mother; Diabetes in his maternal uncle and mother; Hypertension in his father; Prostate cancer in his father.He reports that he has been smoking cigarettes and cigars. He has a 37.50 pack-year smoking history. He has never used smokeless tobacco. He reports current alcohol use. He reports current drug use. Drug: Marijuana.  Current Outpatient Medications on File Prior to Visit  Medication Sig Dispense Refill  . aspirin EC 81 MG tablet Take 1 tablet (81 mg total) by mouth daily. For heart health 30 tablet 0  . Fluticasone-Salmeterol (WIXELA INHUB) 250-50 MCG/DOSE AEPB Inhale 1 puff into the lungs 2 (two) times daily. 60 each 5  . insulin glargine (LANTUS) 100 UNIT/ML injection Inject 0.6 mLs (60 Units total) into the skin daily. For diabetes management 10 mL 11  . metFORMIN (GLUCOPHAGE) 500 MG tablet Take 1 tablet (500 mg total) by mouth 2 (two) times daily with a meal. 180 tablet 3  . metoprolol tartrate (LOPRESSOR) 25 MG tablet Take 1 tablet (25 mg total) by mouth 2 (two) times daily. For high blood pressure 10 tablet 0  . mirtazapine (REMERON) 30 MG tablet Take 1 tablet (30 mg total) by mouth at bedtime. 90 tablet 0  . multivitamin-iron-minerals-folic acid (CENTRUM) chewable tablet Chew 1 tablet by mouth daily.    . risperiDONE (RISPERDAL) 2 MG tablet Take  1 tablet (2 mg) qam, take 2 tablets (4 mg) qhs 270 tablet 0  . simvastatin (ZOCOR) 40 MG tablet Take 1 tablet (40 mg total) by mouth at bedtime. For high cholesterol 30 tablet 0  . traZODone (DESYREL) 100 MG tablet Take 1 tablet (100 mg total) by mouth at bedtime as needed for sleep. 90 tablet 0  . Vitamin D, Ergocalciferol, (DRISDOL) 50000  units CAPS capsule Take 1 capsule (50,000 Units total) by mouth once a week. For bone health 1 capsule 0   No current facility-administered medications on file prior to visit.      Objective:  Objective  Physical Exam Vitals signs and nursing note reviewed.  Constitutional:      General: He is sleeping.     Appearance: He is well-developed.  HENT:     Head: Normocephalic and atraumatic.  Eyes:     Pupils: Pupils are equal, round, and reactive to light.  Neck:     Musculoskeletal: Normal range of motion and neck supple.     Thyroid: No thyromegaly.  Cardiovascular:     Rate and Rhythm: Normal rate and regular rhythm.     Heart sounds: No murmur.  Pulmonary:     Effort: Pulmonary effort is normal. No respiratory distress.     Breath sounds: Normal breath sounds. No wheezing or rales.  Chest:     Chest wall: No tenderness.  Musculoskeletal:        General: No tenderness.  Skin:    General: Skin is warm and dry.  Neurological:     Mental Status: He is oriented to person, place, and time.  Psychiatric:        Behavior: Behavior normal.        Thought Content: Thought content normal.        Judgment: Judgment normal.    Diabetic Foot Exam - Simple   Simple Foot Form Diabetic Foot exam was performed with the following findings:  Yes 04/06/2018  4:45 PM  Visual Inspection No deformities, no ulcerations, no other skin breakdown bilaterally:  Yes Sensation Testing Intact to touch and monofilament testing bilaterally:  Yes Pulse Check Posterior Tibialis and Dorsalis pulse intact bilaterally:  Yes Comments     BP 120/68   Pulse 90   Temp 98.7 F (37.1 C) (Oral)   Resp 16   Ht 5' 10"  (9.622 m)   Wt 249 lb (112.9 kg)   SpO2 95%   BMI 35.73 kg/m  Wt Readings from Last 3 Encounters:  04/06/18 249 lb (112.9 kg)  04/06/18 249 lb 6.4 oz (113.1 kg)  02/02/18 250 lb (113.4 kg)     Lab Results  Component Value Date   WBC 14.9 (H) 01/13/2018   HGB 14.7 01/13/2018    HCT 46.8 01/13/2018   PLT 466 (H) 01/13/2018   GLUCOSE 162 (H) 01/13/2018   CHOL 116 11/20/2017   TRIG 108 11/20/2017   HDL 41 11/20/2017   LDLDIRECT 133.0 05/27/2016   LDLCALC 53 11/20/2017   ALT 37 01/13/2018   AST 33 01/13/2018   NA 136 01/13/2018   K 3.8 01/13/2018   CL 105 01/13/2018   CREATININE 1.97 (H) 01/13/2018   BUN 17 01/13/2018   CO2 20 (L) 01/13/2018   TSH 1.855 11/19/2017   PSA 0.5 09/30/2017   HGBA1C 10.8 (H) 11/20/2017   MICROALBUR 34.8 09/30/2017    Dg Chest 2 View  Result Date: 01/13/2018 CLINICAL DATA:  Acting manic since yesterday, history of paranoid schizophrenia,  hypertension, diabetes mellitus EXAM: CHEST - 2 VIEW COMPARISON:  11/05/2017 FINDINGS: Normal heart size, mediastinal contours, and pulmonary vascularity. Lungs clear. No infiltrate, pleural effusion or pneumothorax. Multiple old RIGHT rib fractures posterolaterally. No acute osseous findings. IMPRESSION: No acute abnormalities. Electronically Signed   By: Lavonia Dana M.D.   On: 01/13/2018 15:59     Assessment & Plan:  Plan  I am having [name]. maintain his insulin glargine, metoprolol tartrate, simvastatin, Vitamin D (Ergocalciferol), aspirin EC, Fluticasone-Salmeterol, multivitamin-iron-minerals-folic acid, metFORMIN, mirtazapine, risperiDONE, traZODone, and blood glucose meter kit and supplies.  Meds ordered this encounter  Medications  . blood glucose meter kit and supplies KIT    Sig: Dispense based on patient and insurance preference. Use up to four times daily as directed. (FOR ICD-9 250.00, 250.01).    Dispense:  1 each    Refill:  0    Order Specific Question:   Number of strips    Answer:   100    Order Specific Question:   Number of lancets    Answer:   100    Problem List Items Addressed This Visit      Unprioritized   DM (diabetes mellitus) type II uncontrolled, periph vascular disorder ([place])    hgba1c to be checked, minimize simple carbs. Increase exercise as  tolerated. Continue current meds       HTN (hypertension)    Well controlled, no changes to meds. Encouraged heart healthy diet such as the DASH diet and exercise as tolerated.       Relevant Orders   Hemoglobin A1c   Lipid panel   Comprehensive metabolic panel   Hyperlipidemia    Tolerating statin, encouraged heart healthy diet, avoid trans fats, minimize simple carbs and saturated fats. Increase exercise as tolerated      Schizoaffective disorder, bipolar type ([place])    Per psych       Other Visit Diagnoses    Hyperlipidemia associated with type 2 diabetes mellitus ([place])    -  Primary   Relevant Orders   Hemoglobin A1c   Lipid panel   Comprehensive metabolic panel   Type 2 diabetes mellitus with hyperglycemia, unspecified whether long term insulin use ([facility])       Relevant Medications   blood glucose meter kit and supplies KIT   Other Relevant Orders   Hemoglobin A1c   Lipid panel   Comprehensive metabolic panel   Ambulatory referral to Podiatry      Follow-up: Return in about 3 months (around 07/06/2018), or if symptoms worsen or fail to improve, for diabetes II, hyperlipidemia, hypertension.  Ann Held, DO     +

## 2018-04-06 NOTE — Assessment & Plan Note (Signed)
Tolerating statin, encouraged heart healthy diet, avoid trans fats, minimize simple carbs and saturated fats. Increase exercise as tolerated 

## 2018-04-06 NOTE — Patient Instructions (Signed)
Follow up with Dr.Lowne directly after this appointment  Please schedule your next medicare wellness visit with me in 1 yr.  Continue doing brain stimulating activities (puzzles, reading, adult coloring books, staying active) to keep memory sharp.   Continue to eat heart healthy diet (full of fruits, vegetables, whole grains, lean protein, water--limit salt, fat, and sugar intake) and increase physical activity as tolerated.   [name] [name] , Thank you for taking time to come for your Medicare Wellness Visit. I appreciate your ongoing commitment to your health goals. Please review the following plan we discussed and let me know if I can assist you in the future.   These are the goals we discussed: Goals    . DIET - DECREASE SODA OR JUICE INTAKE       This is a list of the screening recommended for you and due dates:  Health Maintenance  Topic Date Due  . Eye exam for diabetics  11/16/1977  . Tetanus Vaccine  11/17/1986  . Colon Cancer Screening  11/16/2017  . Hemoglobin A1C  05/21/2018  . Complete foot exam   10/01/2018  . Urine Protein Check  10/01/2018  . Flu Shot  Completed  . Pneumococcal vaccine  Completed  . HIV Screening  Completed    Health Maintenance After Age 39 After age 79, you are at a higher risk for certain long-term diseases and infections as well as injuries from falls. Falls are a major cause of broken bones and head injuries in people who are older than age 84. Getting regular preventive care can help to keep you healthy and well. Preventive care includes getting regular testing and making lifestyle changes as recommended by your health care provider. Talk with your health care provider about:  Which screenings and tests you should have. A screening is a test that checks for a disease when you have no symptoms.  A diet and exercise plan that is right for you. What should I know about screenings and tests to prevent falls? Screening and testing are the best  ways to find a health problem early. Early diagnosis and treatment give you the best chance of managing medical conditions that are common after age 72. Certain conditions and lifestyle choices may make you more likely to have a fall. Your health care provider may recommend:  Regular vision checks. Poor vision and conditions such as cataracts can make you more likely to have a fall. If you wear glasses, make sure to get your prescription updated if your vision changes.  Medicine review. Work with your health care provider to regularly review all of the medicines you are taking, including over-the-counter medicines. Ask your health care provider about any side effects that may make you more likely to have a fall. Tell your health care provider if any medicines that you take make you feel dizzy or sleepy.  Osteoporosis screening. Osteoporosis is a condition that causes the bones to get weaker. This can make the bones weak and cause them to break more easily.  Blood pressure screening. Blood pressure changes and medicines to control blood pressure can make you feel dizzy.  Strength and balance checks. Your health care provider may recommend certain tests to check your strength and balance while standing, walking, or changing positions.  Foot health exam. Foot pain and numbness, as well as not wearing proper footwear, can make you more likely to have a fall.  Depression screening. You may be more likely to have a fall if you  have a fear of falling, feel emotionally low, or feel unable to do activities that you used to do.  Alcohol use screening. Using too much alcohol can affect your balance and may make you more likely to have a fall. What actions can I take to lower my risk of falls? General instructions  Talk with your health care provider about your risks for falling. Tell your health care provider if: ? You fall. Be sure to tell your health care provider about all falls, even ones that seem  minor. ? You feel dizzy, sleepy, or off-balance.  Take over-the-counter and prescription medicines only as told by your health care provider. These include any supplements.  Eat a healthy diet and maintain a healthy weight. A healthy diet includes low-fat dairy products, low-fat (lean) meats, and fiber from whole grains, beans, and lots of fruits and vegetables. Home safety  Remove any tripping hazards, such as rugs, cords, and clutter.  Install safety equipment such as grab bars in bathrooms and safety rails on stairs.  Keep rooms and walkways well-lit. Activity   Follow a regular exercise program to stay fit. This will help you maintain your balance. Ask your health care provider what types of exercise are appropriate for you.  If you need a cane or walker, use it as recommended by your health care provider.  Wear supportive shoes that have nonskid soles. Lifestyle  Do not drink alcohol if your health care provider tells you not to drink.  If you drink alcohol, limit how much you have: ? 0-1 drink a day for women. ? 0-2 drinks a day for men.  Be aware of how much alcohol is in your drink. In the [place] one drink equals one typical bottle of beer (12 oz), one-half glass of wine (5 oz), or one shot of hard liquor (1 oz).  Do not use any products that contain nicotine or tobacco, such as cigarettes and e-cigarettes. If you need help quitting, ask your health care provider. Summary  Having a healthy lifestyle and getting preventive care can help to protect your health and wellness after age 41.  Screening and testing are the best way to find a health problem early and help you avoid having a fall. Early diagnosis and treatment give you the best chance for managing medical conditions that are more common for people who are older than age 4.  Falls are a major cause of broken bones and head injuries in people who are older than age 54. Take precautions to prevent a fall at  home.  Work with your health care provider to learn what changes you can make to improve your health and wellness and to prevent falls. This information is not intended to replace advice given to you by your health care provider. Make sure you discuss any questions you have with your health care provider. Document Released: 01/12/2017 Document Revised: 01/12/2017 Document Reviewed: 01/12/2017 Elsevier Interactive Patient Education  2019 [employer].

## 2018-04-06 NOTE — Assessment & Plan Note (Signed)
Well controlled, no changes to meds. Encouraged heart healthy diet such as the DASH diet and exercise as tolerated.  °

## 2018-04-06 NOTE — Assessment & Plan Note (Signed)
hgba1c to be checked, minimize simple carbs. Increase exercise as tolerated. Continue current meds  

## 2018-04-07 LAB — COMPREHENSIVE METABOLIC PANEL
ALT: 24 U/L (ref 0–53)
AST: 20 U/L (ref 0–37)
Albumin: 4 g/dL (ref 3.5–5.2)
Alkaline Phosphatase: 65 U/L (ref 39–117)
BUN: 10 mg/dL (ref 6–23)
CO2: 33 mEq/L — ABNORMAL HIGH (ref 19–32)
Calcium: 9.8 mg/dL (ref 8.4–10.5)
Chloride: 98 mEq/L (ref 96–112)
Creatinine, Ser: 1.65 mg/dL — ABNORMAL HIGH (ref 0.40–1.50)
GFR: 53.61 mL/min — ABNORMAL LOW (ref >60.00)
Glucose, Bld: 70 mg/dL (ref 70–99)
Potassium: 4.2 mEq/L (ref 3.5–5.1)
Sodium: 139 mEq/L (ref 135–145)
Total Bilirubin: 0.2 mg/dL (ref 0.2–1.2)
Total Protein: 6.8 g/dL (ref 6.0–8.3)

## 2018-04-07 LAB — LIPID PANEL
Cholesterol: 141 mg/dL (ref 0–200)
HDL: 38 mg/dL — ABNORMAL LOW (ref >39.00)
NonHDL: 103.11
Total CHOL/HDL Ratio: 4
Triglycerides: 295 mg/dL — ABNORMAL HIGH (ref 0.0–149.0)
VLDL: 59 mg/dL — ABNORMAL HIGH (ref 0.0–40.0)

## 2018-04-07 LAB — LDL CHOLESTEROL, DIRECT: LDL DIRECT: 74 mg/dL

## 2018-04-07 LAB — HEMOGLOBIN A1C: Hgb A1c MFr Bld: 6.6 % — ABNORMAL HIGH (ref 4.6–6.5)

## 2018-04-12 ENCOUNTER — Other Ambulatory Visit: Payer: Self-pay | Admitting: Family Medicine

## 2018-04-12 MED ORDER — FENOFIBRATE 160 MG PO TABS: 160.0000 mg | ORAL_TABLET | Freq: Every day | ORAL | 2 refills | Status: DC

## 2018-04-13 LAB — HM DIABETES EYE EXAM

## 2018-04-17 ENCOUNTER — Telehealth: Payer: Self-pay | Admitting: *Deleted

## 2018-04-17 NOTE — Telephone Encounter (Signed)
Received Diabetic Eye Exam Report from [name]; forwarded to provider/SLS 02/03

## 2018-04-19 ENCOUNTER — Other Ambulatory Visit: Payer: Self-pay | Admitting: Family Medicine

## 2018-04-23 ENCOUNTER — Other Ambulatory Visit: Payer: Self-pay | Admitting: Family Medicine

## 2018-05-03 ENCOUNTER — Encounter: Payer: Self-pay | Admitting: *Deleted

## 2018-05-03 ENCOUNTER — Ambulatory Visit: Payer: Medicare HMO | Admitting: Podiatry

## 2018-05-04 ENCOUNTER — Telehealth: Payer: Self-pay | Admitting: Family Medicine

## 2018-05-04 MED ORDER — PEN NEEDLES 32G X 6 MM MISC: 1.0000 | Freq: Every day | 1 refills | Status: DC

## 2018-05-04 NOTE — Telephone Encounter (Signed)
Patient needs Rx for pen needles- not on current medication list

## 2018-05-04 NOTE — Telephone Encounter (Signed)
Patients guardian notified rx has been sent in.

## 2018-05-04 NOTE — Telephone Encounter (Signed)
Copied from [place] [id number]. Topic: Quick Communication - Rx Refill/Question >> May 04, 2018  1:04 PM Rayann Heman wrote: Medication: pt called and stated that he needs a refill of pen needles 37mm x 32g    Has the patient contacted their pharmacy? Yes/ was faxed   Preferred Pharmacy (with phone number or street name):[id number] - [place], [place] [phone] (Phone) [phone] (Fax)  Agent: Please be advised that RX refills may take up to 3 business days. We ask that you follow-up with your pharmacy.

## 2018-05-17 ENCOUNTER — Encounter: Payer: Self-pay | Admitting: Podiatry

## 2018-05-17 ENCOUNTER — Ambulatory Visit: Payer: Medicare HMO | Admitting: Podiatry

## 2018-05-17 VITALS — BP 133/90 | HR 99

## 2018-05-17 DIAGNOSIS — E119 Type 2 diabetes mellitus without complications: Secondary | ICD-10-CM | POA: Diagnosis not present

## 2018-05-17 DIAGNOSIS — M79675 Pain in left toe(s): Secondary | ICD-10-CM | POA: Diagnosis not present

## 2018-05-17 DIAGNOSIS — B351 Tinea unguium: Secondary | ICD-10-CM

## 2018-05-17 DIAGNOSIS — M79674 Pain in right toe(s): Secondary | ICD-10-CM | POA: Diagnosis not present

## 2018-05-17 NOTE — Progress Notes (Signed)
This patient presents to the office with chief complaint of long thick nails and diabetic feet.  This patient  says there  is  no pain and discomfort in his  feet.  This patient says there are long thick painful nails.  These nails are painful walking and wearing shoes.  Patient has no history of infection or drainage from both feet.  Patient is unable to  self treat his own nails . This patient presents  to the office today for treatment of the  long nails and a foot evaluation due to history of  diabetes.  General Appearance  Alert, conversant and in no acute stress.  Vascular  Dorsalis pedis and posterior tibial  pulses are palpable  bilaterally.  Capillary return is within normal limits  bilaterally. Temperature is within normal limits  bilaterally.  Neurologic  Senn-Weinstein monofilament wire test within normal limits  bilaterally. Muscle power within normal limits bilaterally.  Nails Thick disfigured discolored nails with subungual debris  from hallux to fifth toes bilaterally. No evidence of bacterial infection or drainage bilaterally.  Orthopedic  No limitations of motion of motion feet .  No crepitus or effusions noted.  No bony pathology or digital deformities noted.  Skin  normotropic skin with no porokeratosis noted bilaterally.  No signs of infections or ulcers noted.     Onychomycosis  Diabetes with no foot complications  IE  Debride nails x 10.  A diabetic foot exam was performed and there is no evidence of any vascular or neurologic pathology.   RTC 3 months.   Blayde Bacigalupi DPM  

## 2018-05-29 ENCOUNTER — Other Ambulatory Visit (HOSPITAL_COMMUNITY): Payer: Self-pay

## 2018-05-29 DIAGNOSIS — F200 Paranoid schizophrenia: Secondary | ICD-10-CM

## 2018-05-29 MED ORDER — RISPERIDONE 2 MG PO TABS: ORAL_TABLET | ORAL | 0 refills | Status: DC

## 2018-05-30 ENCOUNTER — Other Ambulatory Visit (HOSPITAL_COMMUNITY): Payer: Self-pay

## 2018-05-30 DIAGNOSIS — F200 Paranoid schizophrenia: Secondary | ICD-10-CM

## 2018-05-30 DIAGNOSIS — F419 Anxiety disorder, unspecified: Secondary | ICD-10-CM

## 2018-06-08 ENCOUNTER — Ambulatory Visit (INDEPENDENT_AMBULATORY_CARE_PROVIDER_SITE_OTHER): Payer: Medicare HMO | Admitting: Psychiatry

## 2018-06-08 ENCOUNTER — Other Ambulatory Visit: Payer: Self-pay

## 2018-06-08 DIAGNOSIS — F419 Anxiety disorder, unspecified: Secondary | ICD-10-CM

## 2018-06-08 DIAGNOSIS — F200 Paranoid schizophrenia: Secondary | ICD-10-CM | POA: Diagnosis not present

## 2018-06-08 MED ORDER — TRAZODONE HCL 100 MG PO TABS: 100.0000 mg | ORAL_TABLET | Freq: Every evening | ORAL | 0 refills | Status: DC | PRN

## 2018-06-08 MED ORDER — MIRTAZAPINE 30 MG PO TABS: 30.0000 mg | ORAL_TABLET | Freq: Every day | ORAL | 0 refills | Status: AC

## 2018-06-08 MED ORDER — MIRTAZAPINE 30 MG PO TABS: 30.0000 mg | ORAL_TABLET | Freq: Every day | ORAL | 0 refills | Status: DC

## 2018-06-08 MED ORDER — RISPERIDONE 2 MG PO TABS: ORAL_TABLET | ORAL | 0 refills | Status: DC

## 2018-06-08 MED ORDER — TRAZODONE HCL 100 MG PO TABS: 100.0000 mg | ORAL_TABLET | Freq: Every evening | ORAL | 0 refills | Status: AC | PRN

## 2018-06-08 NOTE — Progress Notes (Signed)
Virtual Visit via Telephone Note  I connected with [name] [name]. on 06/08/18 at  2:00 PM EDT by telephone and verified that I am speaking with the correct person using two identifiers.   I discussed the limitations, risks, security and privacy concerns of performing an evaluation and management service by telephone and the availability of in person appointments. I also discussed with the patient that there may be a patient responsible charge related to this service. The patient expressed understanding and agreed to proceed.   History of Present Illness: Patient evaluated by telephone session.  Most of the information was obtained through his legal guardian [name] [name].  Apparently patient had poor sleep last week and he was imagination about his mother and his uncle.  He believed his uncle is coming from [place] and living with his mother's house and he wanted to live with him.  However his guardian told that it was not true.  Patient's mother died in 02/07/23.  In the beginning he was insisting to pack up his belongings but later he calmed down.  His legal guardian also noticed that he was having issues with his sister about cigarettes.  He usually smokes 1 pack a day but lately he has been smoking more than usual.  His legal guardian unsure if he had missed the medication.  Caretaker told that he is talking to himself but he has not been very agitated, aggressive or violent.  In January he was appointed from the court as a legal guardian.  Patient lives with him.  Recently patient seen his primary care physician and he has blood work.  His hemoglobin A1c dropped from 10.8-6.6 and his creatinine also dropped from 1.97-1.65.  I spoke to patient on the phone who reported that he has been sleeping too much but denies any suicidal thoughts or homicidal thought.  He does not recall what medicine he is taking since his legal guardian supervises his medication.  He admitted that he is taking dinner very  late and not sleeping early and he usually wakes up very late in the morning.  Patient does not appear to be interested in discussion and mentioned that everything is okay and he has no issues with the medication.  However he denies any suicidal thoughts, homicidal thoughts or any irritability.  He denies any crying spells or any feeling of hopelessness or worthlessness.  He even did not discuss about his imagination and delusions which happened last week.  He recall his mother is deceased last year and he is living with his legal guardian and he has no issues living with his legal guardian.    Past Psychiatric History: Reviewed. History of psychiatric illness with multiple hospitalization.  History of irrational and bizarre behavior and suicidal attempt.  Admitted to [facility], [facility] and recently old [facility].   H/O noncompliance of medication. Took  Abilify, Lexapro, Risperdal and Neurontin. We recommended injectable but his insurance does not approve.   Recent Results (from the past 2160 hour(s))  Hemoglobin A1c     Status: Abnormal   Collection Time: 04/06/18  3:14 PM  Result Value Ref Range   Hgb A1c MFr Bld 6.6 (H) 4.6 - 6.5 %    Comment: Glycemic Control Guidelines for People with Diabetes:Non Diabetic:  <6%Goal of Therapy: <7%Additional Action Suggested:  >8%   Lipid panel     Status: Abnormal   Collection Time: 04/06/18  3:14 PM  Result Value Ref Range  Cholesterol 141 0 - 200 mg/dL    Comment: ATP III Classification       Desirable:  < 200 mg/dL               Borderline High:  200 - 239 mg/dL          High:  > = 240 mg/dL   Triglycerides 295.0 (H) 0.0 - 149.0 mg/dL    Comment: Normal:  <150 mg/dLBorderline High:  150 - 199 mg/dL   HDL 38.00 (L) >39.00 mg/dL   VLDL 59.0 (H) 0.0 - 40.0 mg/dL   Total CHOL/HDL Ratio 4     Comment:                Men          Women1/2 Average Risk     3.4          3.3Average Risk          5.0           4.42X Average Risk          9.6          7.13X Average Risk          15.0          11.0                       NonHDL 103.11     Comment: NOTE:  Non-HDL goal should be 30 mg/dL higher than patient's LDL goal (i.e. LDL goal of < 70 mg/dL, would have non-HDL goal of < 100 mg/dL)  Comprehensive metabolic panel     Status: Abnormal   Collection Time: 04/06/18  3:14 PM  Result Value Ref Range   Sodium 139 135 - 145 mEq/L   Potassium 4.2 3.5 - 5.1 mEq/L   Chloride 98 96 - 112 mEq/L   CO2 33 (H) 19 - 32 mEq/L   Glucose, Bld 70 70 - 99 mg/dL   BUN 10 6 - 23 mg/dL   Creatinine, Ser 1.65 (H) 0.40 - 1.50 mg/dL   Total Bilirubin 0.2 0.2 - 1.2 mg/dL   Alkaline Phosphatase 65 39 - 117 U/L   AST 20 0 - 37 U/L   ALT 24 0 - 53 U/L   Total Protein 6.8 6.0 - 8.3 g/dL   Albumin 4.0 3.5 - 5.2 g/dL   Calcium 9.8 8.4 - 10.5 mg/dL   GFR 53.61 (L) >60.00 mL/min  LDL cholesterol, direct     Status: None   Collection Time: 04/06/18  3:14 PM  Result Value Ref Range   Direct LDL 74.0 mg/dL    Comment: Optimal:  <100 mg/dLNear or Above Optimal:  100-129 mg/dLBorderline High:  130-159 mg/dLHigh:  160-189 mg/dLVery High:  >190 mg/dL  HM DIABETES EYE EXAM     Status: None   Collection Time: 04/13/18 12:00 AM  Result Value Ref Range   HM Diabetic Eye Exam No Retinopathy No Retinopathy   Mental status amnesia : Limited mental status admission done due to telephone session.  His speech is slow and at times incoherent with decreased tone and volume.  His thought process is circumstantial.  He denies any hallucination, suicidal thoughts or any homicidal thoughts.  His fund of knowledge is average.  He was easily getting distracted but does not appear to be agitated on the phone.  His memory and concentration is fair.  He did not participate in formal cognitive exam.  He admitted  taking his medication and he does not want to stop and did not report any side effects.  He describes his mood is tired.  His fund of knowledge  is below average.  His insight judgment is fair.  Assessment and Plan: Schizophrenia chronic paranoid type.  Anxiety.  I reviewed his blood work results, Merchandiser, retail information from primary care physician, his legal guardian [name] [name] and current medication.  I do believe his delusions are imagination mood is a group could be due to poor compliant with medication.  We have recommended and suggested Risperdal Consta injection but his insurance does not approve.  Though he admitted that he has been sleeping too much but his legal guardian mention that he is sleeping during the day.  I recommended he may take all his Risperdal 6 mg dose at bedtime to get his sleep cycle under control.  He is creatinine and hemoglobin A1c is improved from the past.  His legal guardian reported that his current medicine is working if he takes on time.  I will continue Remeron 30 mg at bedtime, Risperdal 6 mg a day and trazodone 100 mg at bedtime.  I discussed importance of medication compliance to avoid any relapse.  I also discussed medication side effects and benefits with the patient.  Discussed safety concern that anytime having active suicidal thoughts or homicidal thought that he need to call 911 or go to the local emergency room.  I will see him again in 3 months.    Follow Up Instructions:    I discussed the assessment and treatment plan with the patient. The patient was provided an opportunity to ask questions and all were answered. The patient agreed with the plan and demonstrated an understanding of the instructions.   The patient was advised to call back or seek an in-person evaluation if the symptoms worsen or if the condition fails to improve as anticipated.  I provided 25 minutes of non-face-to-face time during this encounter.   Kathlee Nations, MD

## 2018-07-06 ENCOUNTER — Encounter: Payer: Self-pay | Admitting: Family Medicine

## 2018-07-06 ENCOUNTER — Other Ambulatory Visit: Payer: Self-pay

## 2018-07-06 ENCOUNTER — Ambulatory Visit (INDEPENDENT_AMBULATORY_CARE_PROVIDER_SITE_OTHER): Payer: Medicare HMO | Admitting: Family Medicine

## 2018-07-06 DIAGNOSIS — I10 Essential (primary) hypertension: Secondary | ICD-10-CM | POA: Diagnosis not present

## 2018-07-06 DIAGNOSIS — E1169 Type 2 diabetes mellitus with other specified complication: Secondary | ICD-10-CM

## 2018-07-06 DIAGNOSIS — E785 Hyperlipidemia, unspecified: Secondary | ICD-10-CM

## 2018-07-06 DIAGNOSIS — E1165 Type 2 diabetes mellitus with hyperglycemia: Secondary | ICD-10-CM

## 2018-07-06 DIAGNOSIS — IMO0002 Reserved for concepts with insufficient information to code with codable children: Secondary | ICD-10-CM

## 2018-07-06 DIAGNOSIS — E1151 Type 2 diabetes mellitus with diabetic peripheral angiopathy without gangrene: Secondary | ICD-10-CM | POA: Diagnosis not present

## 2018-07-06 NOTE — Progress Notes (Signed)
Virtual Visit via Video Note  I connected with [name] [name]. on 07/06/18 at  2:00 PM EDT by a video enabled telemedicine application and verified that I am speaking with the correct person using two identifiers.   I discussed the limitations of evaluation and management by telemedicine and the availability of in person appointments. The patient expressed understanding and agreed to proceed.  History of Present Illness: Pt is home and needs f/u dm, bp and cholesterol--- unable to do video visit after few min-- had to change to phone visit   HYPERTENSION   Blood pressure range-good per pt and caretaker   Chest pain- no      Dyspnea- no Lightheadedness- no   Edema- no  Other side effects - no   Medication compliance: good Low salt diet- yes     DIABETES    Blood Sugar ranges-good per caretaker  Polyuria- no New Visual problems- no  Hypoglycemic symptoms- no  Other side effects-no Medication compliance - good     HYPERLIPIDEMIA  Medication compliance- good RUQ pain- no  Muscle aches- no Other side effects-no   . Review of Systems  Constitutional: Negative for activity change, appetite change and fatigue.  HENT: Negative for hearing loss, congestion, tinnitus and ear discharge.  dentist q34mEyes: Negative for visual disturbance (see optho q1y -- vision corrected to 20/20 with glasses).  Respiratory: Negative for cough, chest tightness and shortness of breath.   Cardiovascular: Negative for chest pain, palpitations and leg swelling.  Gastrointestinal: Negative for abdominal pain, diarrhea, constipation and abdominal distention.  Genitourinary: Negative for urgency, frequency, decreased urine volume and difficulty urinating.  Musculoskeletal: Negative for back pain, arthralgias and gait problem.  Skin: Negative for color change, pallor and rash.  Neurological: Negative for dizziness, light-headedness, numbness and headaches.  Hematological: Negative for adenopathy.  Does not bruise/bleed easily.  Psychiatric/Behavioral: Negative for suicidal ideas, confusion, sleep disturbance, self-injury, dysphoric mood, decreased concentration and agitation.     Past Medical History:  Diagnosis Date  . [place], [place]   . HTN (hypertension)   . Hyperlipidemia LDL goal <70   . Schizophrenia ([facility]    Outpatient Encounter Medications as of 07/06/2018  Medication Sig  . ACCU-CHEK AVIVA PLUS test strip TEST 4 X DAILY  . ACCU-CHEK FASTCLIX LANCETS MISC CHECK BLOOD SUGAR 4X DAILY  . aspirin EC 81 MG tablet Take 1 tablet (81 mg total) by mouth daily. For heart health  . blood glucose meter kit and supplies KIT Dispense based on patient and insurance preference. Use up to four times daily as directed. (FOR ICD-9 250.00, 250.01).  . fenofibrate 160 MG tablet Take 1 tablet (160 mg total) by mouth daily.  . Fluticasone-Salmeterol (WIXELA INHUB) 250-50 MCG/DOSE AEPB Inhale 1 puff into the lungs 2 (two) times daily.  . Insulin Pen Needle (PEN NEEDLES) 32G X 6 MM MISC Inject 1 each into the skin daily.  .Marland KitchenLANTUS SOLOSTAR 100 UNIT/ML Solostar Pen   . metFORMIN (GLUCOPHAGE) 500 MG tablet Take 1 tablet (500 mg total) by mouth 2 (two) times daily with a meal.  . metoprolol tartrate (LOPRESSOR) 25 MG tablet TAKE 1 TABLET BY MOUTH TWICE A DAY  . mirtazapine (REMERON) 30 MG tablet Take 1 tablet (30 mg total) by mouth at bedtime.  . multivitamin-iron-minerals-folic acid (CENTRUM) chewable tablet Chew 1 tablet by mouth daily.  . risperiDONE (RISPERDAL) 2 MG tablet Take 1 tablet (2 mg) qam, take 2 tablets (4 mg) qhs  .  simvastatin (ZOCOR) 40 MG tablet TAKE 1 TABLET BY MOUTH EVERY DAY  . traZODone (DESYREL) 100 MG tablet Take 1 tablet (100 mg total) by mouth at bedtime as needed for sleep.  . Vitamin D, Ergocalciferol, (DRISDOL) 50000 units CAPS capsule Take 1 capsule (50,000 Units total) by mouth once a week. For bone health   No facility-administered  encounter medications on file as of 07/06/2018.        Observations/Objective: 123/77  bs 100  Pt in NAD,  rr normal  Pt neatly dressed   Assessment and Plan: 1. DM (diabetes mellitus) type II uncontrolled, periph vascular disorder (HCC) hgba1c to be checked , minimize simple carbs. Increase exercise as tolerated. Continue current meds   - Lipid panel; Future - Hemoglobin A1c; Future - Comprehensive metabolic panel; Future  2. Hyperlipidemia associated with type 2 diabetes mellitus (HCC) Tolerating statin, encouraged heart healthy diet, avoid trans fats, minimize simple carbs and saturated fats. Increase exercise as tolerated - Lipid panel; Future - Hemoglobin A1c; Future - Comprehensive metabolic panel; Future  3. Essential hypertension Well controlled, no changes to meds. Encouraged heart healthy diet such as the DASH diet and exercise as tolerated.  - Lipid panel; Future - Hemoglobin A1c; Future - Comprehensive metabolic panel; Future   Follow Up Instructions:    I discussed the assessment and treatment plan with the patient. The patient was provided an opportunity to ask questions and all were answered. The patient agreed with the plan and demonstrated an understanding of the instructions.   The patient was advised to call back or seek an in-person evaluation if the symptoms worsen or if the condition fails to improve as anticipated.  I provided 30 minutes of non-face-to-face time during this encounter.    R Lowne Chase, DO   

## 2018-07-19 ENCOUNTER — Other Ambulatory Visit: Payer: Self-pay | Admitting: Family Medicine

## 2018-08-09 ENCOUNTER — Telehealth (HOSPITAL_COMMUNITY): Payer: Self-pay

## 2018-08-09 NOTE — Telephone Encounter (Signed)
Patient's legal guardian called stating that patient has had outbursts of crying and that his speech is running together and he is not making sense of his words. Guardian stated patient was ok until a confrontation they had Saturday. Guardian stated out of the blue patient had asked him for CBD and hemp. When guardian told patient no it caused a confrontation. Guardian also stated patient told him someone came to see him that the guardian does not know. Guardian stated that the patient has not been right since Saturday and that he is not sleeping. Guardian is seeking advice as to whether he should ride this out or send patient to a facility for evaluation. Please review and advise. Thank you.

## 2018-08-09 NOTE — Telephone Encounter (Signed)
He can try another trazodone 100 mg at bedtime to good sleep.  If symptoms continue to get worse or anytime agitated and aggressive then he need to be evaluated in the emergency room.

## 2018-08-10 ENCOUNTER — Observation Stay (HOSPITAL_COMMUNITY): Payer: Medicare HMO

## 2018-08-10 ENCOUNTER — Encounter (HOSPITAL_COMMUNITY): Payer: Self-pay | Admitting: Emergency Medicine

## 2018-08-10 ENCOUNTER — Observation Stay (HOSPITAL_BASED_OUTPATIENT_CLINIC_OR_DEPARTMENT_OTHER)
Admission: EM | Admit: 2018-08-10 | Discharge: 2018-08-11 | Disposition: A | Discharge status: Other Acute Inpatient Hospital | Payer: Medicare HMO | Source: Home / Self Care | Attending: Emergency Medicine | Admitting: Emergency Medicine

## 2018-08-10 ENCOUNTER — Other Ambulatory Visit: Payer: Self-pay

## 2018-08-10 ENCOUNTER — Emergency Department (HOSPITAL_COMMUNITY): Payer: Medicare HMO

## 2018-08-10 DIAGNOSIS — Y92230 Patient room in hospital as the place of occurrence of the external cause: Secondary | ICD-10-CM | POA: Diagnosis present

## 2018-08-10 DIAGNOSIS — T424X1A Poisoning by benzodiazepines, accidental (unintentional), initial encounter: Secondary | ICD-10-CM | POA: Diagnosis not present

## 2018-08-10 DIAGNOSIS — N183 Chronic kidney disease, stage 3 (moderate): Secondary | ICD-10-CM

## 2018-08-10 DIAGNOSIS — I129 Hypertensive chronic kidney disease with stage 1 through stage 4 chronic kidney disease, or unspecified chronic kidney disease: Secondary | ICD-10-CM | POA: Diagnosis present

## 2018-08-10 DIAGNOSIS — F17210 Nicotine dependence, cigarettes, uncomplicated: Secondary | ICD-10-CM | POA: Diagnosis present

## 2018-08-10 DIAGNOSIS — E860 Dehydration: Secondary | ICD-10-CM | POA: Diagnosis present

## 2018-08-10 DIAGNOSIS — G4700 Insomnia, unspecified: Secondary | ICD-10-CM | POA: Diagnosis present

## 2018-08-10 DIAGNOSIS — I509 Heart failure, unspecified: Secondary | ICD-10-CM | POA: Diagnosis not present

## 2018-08-10 DIAGNOSIS — Z8249 Family history of ischemic heart disease and other diseases of the circulatory system: Secondary | ICD-10-CM | POA: Diagnosis not present

## 2018-08-10 DIAGNOSIS — E662 Morbid (severe) obesity with alveolar hypoventilation: Secondary | ICD-10-CM | POA: Insufficient documentation

## 2018-08-10 DIAGNOSIS — N189 Chronic kidney disease, unspecified: Secondary | ICD-10-CM | POA: Diagnosis not present

## 2018-08-10 DIAGNOSIS — E1165 Type 2 diabetes mellitus with hyperglycemia: Secondary | ICD-10-CM

## 2018-08-10 DIAGNOSIS — R4689 Other symptoms and signs involving appearance and behavior: Secondary | ICD-10-CM

## 2018-08-10 DIAGNOSIS — E785 Hyperlipidemia, unspecified: Secondary | ICD-10-CM | POA: Diagnosis present

## 2018-08-10 DIAGNOSIS — Z833 Family history of diabetes mellitus: Secondary | ICD-10-CM | POA: Diagnosis not present

## 2018-08-10 DIAGNOSIS — R0902 Hypoxemia: Secondary | ICD-10-CM | POA: Diagnosis not present

## 2018-08-10 DIAGNOSIS — Z720 Tobacco use: Secondary | ICD-10-CM | POA: Diagnosis present

## 2018-08-10 DIAGNOSIS — F200 Paranoid schizophrenia: Secondary | ICD-10-CM | POA: Diagnosis present

## 2018-08-10 DIAGNOSIS — Z1159 Encounter for screening for other viral diseases: Secondary | ICD-10-CM | POA: Diagnosis not present

## 2018-08-10 DIAGNOSIS — J9601 Acute respiratory failure with hypoxia: Secondary | ICD-10-CM | POA: Diagnosis present

## 2018-08-10 DIAGNOSIS — I1310 Hypertensive heart and chronic kidney disease without heart failure, with stage 1 through stage 4 chronic kidney disease, or unspecified chronic kidney disease: Secondary | ICD-10-CM | POA: Insufficient documentation

## 2018-08-10 DIAGNOSIS — G4733 Obstructive sleep apnea (adult) (pediatric): Secondary | ICD-10-CM | POA: Insufficient documentation

## 2018-08-10 DIAGNOSIS — R0600 Dyspnea, unspecified: Secondary | ICD-10-CM | POA: Diagnosis not present

## 2018-08-10 DIAGNOSIS — R410 Disorientation, unspecified: Secondary | ICD-10-CM | POA: Diagnosis not present

## 2018-08-10 DIAGNOSIS — IMO0002 Reserved for concepts with insufficient information to code with codable children: Secondary | ICD-10-CM | POA: Diagnosis present

## 2018-08-10 DIAGNOSIS — E1151 Type 2 diabetes mellitus with diabetic peripheral angiopathy without gangrene: Secondary | ICD-10-CM | POA: Diagnosis not present

## 2018-08-10 DIAGNOSIS — T424X5A Adverse effect of benzodiazepines, initial encounter: Secondary | ICD-10-CM | POA: Diagnosis present

## 2018-08-10 DIAGNOSIS — E119 Type 2 diabetes mellitus without complications: Secondary | ICD-10-CM | POA: Diagnosis not present

## 2018-08-10 DIAGNOSIS — I10 Essential (primary) hypertension: Secondary | ICD-10-CM | POA: Diagnosis present

## 2018-08-10 DIAGNOSIS — E669 Obesity, unspecified: Secondary | ICD-10-CM | POA: Diagnosis not present

## 2018-08-10 DIAGNOSIS — F250 Schizoaffective disorder, bipolar type: Secondary | ICD-10-CM | POA: Diagnosis present

## 2018-08-10 DIAGNOSIS — N179 Acute kidney failure, unspecified: Secondary | ICD-10-CM | POA: Diagnosis present

## 2018-08-10 DIAGNOSIS — R0602 Shortness of breath: Secondary | ICD-10-CM | POA: Diagnosis not present

## 2018-08-10 DIAGNOSIS — J9602 Acute respiratory failure with hypercapnia: Secondary | ICD-10-CM | POA: Diagnosis present

## 2018-08-10 DIAGNOSIS — Z03818 Encounter for observation for suspected exposure to other biological agents ruled out: Secondary | ICD-10-CM | POA: Diagnosis not present

## 2018-08-10 DIAGNOSIS — F201 Disorganized schizophrenia: Secondary | ICD-10-CM | POA: Diagnosis not present

## 2018-08-10 DIAGNOSIS — R4182 Altered mental status, unspecified: Secondary | ICD-10-CM | POA: Diagnosis not present

## 2018-08-10 DIAGNOSIS — G92 Toxic encephalopathy: Secondary | ICD-10-CM | POA: Diagnosis present

## 2018-08-10 DIAGNOSIS — N289 Disorder of kidney and ureter, unspecified: Secondary | ICD-10-CM | POA: Diagnosis not present

## 2018-08-10 DIAGNOSIS — F23 Brief psychotic disorder: Secondary | ICD-10-CM | POA: Diagnosis not present

## 2018-08-10 DIAGNOSIS — E1122 Type 2 diabetes mellitus with diabetic chronic kidney disease: Secondary | ICD-10-CM | POA: Diagnosis present

## 2018-08-10 DIAGNOSIS — R0989 Other specified symptoms and signs involving the circulatory and respiratory systems: Secondary | ICD-10-CM | POA: Diagnosis not present

## 2018-08-10 DIAGNOSIS — Z794 Long term (current) use of insulin: Secondary | ICD-10-CM | POA: Diagnosis not present

## 2018-08-10 DIAGNOSIS — T402X5A Adverse effect of other opioids, initial encounter: Secondary | ICD-10-CM | POA: Diagnosis present

## 2018-08-10 DIAGNOSIS — Z9114 Patient's other noncompliance with medication regimen: Secondary | ICD-10-CM | POA: Diagnosis not present

## 2018-08-10 DIAGNOSIS — L03116 Cellulitis of left lower limb: Secondary | ICD-10-CM | POA: Diagnosis present

## 2018-08-10 LAB — RAPID URINE DRUG SCREEN, HOSP PERFORMED
Amphetamines: NOT DETECTED
Barbiturates: NOT DETECTED
Benzodiazepines: NOT DETECTED
Cocaine: NOT DETECTED
Opiates: NOT DETECTED
Tetrahydrocannabinol: NOT DETECTED

## 2018-08-10 LAB — URINALYSIS, ROUTINE W REFLEX MICROSCOPIC
Bilirubin Urine: NEGATIVE
Glucose, UA: NEGATIVE mg/dL
Ketones, ur: NEGATIVE mg/dL
Leukocytes,Ua: NEGATIVE
Nitrite: NEGATIVE
Protein, ur: 30 mg/dL — AB
Specific Gravity, Urine: 1.004 — ABNORMAL LOW (ref 1.005–1.030)
pH: 7 (ref 5.0–8.0)

## 2018-08-10 LAB — CBC WITH DIFFERENTIAL/PLATELET
Abs Immature Granulocytes: 0.07 10*3/uL (ref 0.00–0.07)
Basophils Absolute: 0 10*3/uL (ref 0.0–0.1)
Basophils Relative: 0 %
Eosinophils Absolute: 0.2 10*3/uL (ref 0.0–0.5)
Eosinophils Relative: 1 %
HCT: 41.2 % (ref 39.0–52.0)
Hemoglobin: 13.3 g/dL (ref 13.0–17.0)
Immature Granulocytes: 1 %
Lymphocytes Relative: 8 %
Lymphs Abs: 1.2 10*3/uL (ref 0.7–4.0)
MCH: 28.5 pg (ref 26.0–34.0)
MCHC: 32.3 g/dL (ref 30.0–36.0)
MCV: 88.2 fL (ref 80.0–100.0)
Monocytes Absolute: 1.3 10*3/uL — ABNORMAL HIGH (ref 0.1–1.0)
Monocytes Relative: 9 %
Neutro Abs: 11.8 10*3/uL — ABNORMAL HIGH (ref 1.7–7.7)
Neutrophils Relative %: 81 %
Platelets: 503 10*3/uL — ABNORMAL HIGH (ref 150–400)
RBC: 4.67 MIL/uL (ref 4.22–5.81)
RDW: 14.3 % (ref 11.5–15.5)
WBC: 14.5 10*3/uL — ABNORMAL HIGH (ref 4.0–10.5)
nRBC: 0 % (ref 0.0–0.2)

## 2018-08-10 LAB — SARS CORONAVIRUS 2 BY RT PCR (HOSPITAL ORDER, PERFORMED IN ~~LOC~~ HOSPITAL LAB): SARS Coronavirus 2: NEGATIVE

## 2018-08-10 LAB — BASIC METABOLIC PANEL
Anion gap: 13 (ref 5–15)
Anion gap: 11 (ref 5–15)
BUN: 16 mg/dL (ref 6–20)
BUN: 12 mg/dL (ref 6–20)
CO2: 23 mmol/L (ref 22–32)
CO2: 22 mmol/L (ref 22–32)
Calcium: 9.4 mg/dL (ref 8.9–10.3)
Calcium: 9.1 mg/dL (ref 8.9–10.3)
Chloride: 97 mmol/L — ABNORMAL LOW (ref 98–111)
Chloride: 103 mmol/L (ref 98–111)
Creatinine, Ser: 2.36 mg/dL — ABNORMAL HIGH (ref 0.61–1.24)
Creatinine, Ser: 2.11 mg/dL — ABNORMAL HIGH (ref 0.61–1.24)
GFR calc Af Amer: 36 mL/min — ABNORMAL LOW (ref >60)
GFR calc Af Amer: 41 mL/min — ABNORMAL LOW (ref >60)
GFR calc non Af Amer: 31 mL/min — ABNORMAL LOW (ref >60)
GFR calc non Af Amer: 35 mL/min — ABNORMAL LOW (ref >60)
Glucose, Bld: 205 mg/dL — ABNORMAL HIGH (ref 70–99)
Glucose, Bld: 119 mg/dL — ABNORMAL HIGH (ref 70–99)
Potassium: 3.8 mmol/L (ref 3.5–5.1)
Potassium: 3.7 mmol/L (ref 3.5–5.1)
Sodium: 133 mmol/L — ABNORMAL LOW (ref 135–145)
Sodium: 136 mmol/L (ref 135–145)

## 2018-08-10 LAB — SALICYLATE LEVEL: Salicylate Lvl: <7 mg/dL (ref 2.8–30.0)

## 2018-08-10 LAB — GLUCOSE, CAPILLARY
Glucose-Capillary: 114 mg/dL — ABNORMAL HIGH (ref 70–99)
Glucose-Capillary: 119 mg/dL — ABNORMAL HIGH (ref 70–99)

## 2018-08-10 LAB — BRAIN NATRIURETIC PEPTIDE: B Natriuretic Peptide: 25.7 pg/mL (ref 0.0–100.0)

## 2018-08-10 LAB — ETHANOL: Alcohol, Ethyl (B): <10 mg/dL (ref <10)

## 2018-08-10 LAB — I-STAT CREATININE, ED: Creatinine, Ser: 2.2 mg/dL — ABNORMAL HIGH (ref 0.61–1.24)

## 2018-08-10 LAB — ACETAMINOPHEN LEVEL: Acetaminophen (Tylenol), Serum: <10 ug/mL — ABNORMAL LOW (ref 10–30)

## 2018-08-10 MED ORDER — INSULIN GLARGINE 100 UNIT/ML ~~LOC~~ SOLN
60.0000 [IU] | Freq: Every day | SUBCUTANEOUS | Status: DC
  Administered 2018-08-10: 60 [IU] via SUBCUTANEOUS
  Filled 2018-08-10 (×2): qty 0.6

## 2018-08-10 MED ORDER — SIMVASTATIN 20 MG PO TABS
40.0000 mg | ORAL_TABLET | Freq: Every day | ORAL | Status: DC
  Administered 2018-08-10: 18:00:00 40 mg via ORAL
  Filled 2018-08-10: qty 2

## 2018-08-10 MED ORDER — TRAZODONE HCL 50 MG PO TABS: 100.0000 mg | ORAL_TABLET | Freq: Every evening | ORAL | Status: DC | PRN

## 2018-08-10 MED ORDER — DOCUSATE SODIUM 100 MG PO CAPS
100.0000 mg | ORAL_CAPSULE | Freq: Two times a day (BID) | ORAL | Status: DC
  Administered 2018-08-10 – 2018-08-11 (×2): 100 mg via ORAL
  Filled 2018-08-10 (×2): qty 1

## 2018-08-10 MED ORDER — FENOFIBRATE 160 MG PO TABS
160.0000 mg | ORAL_TABLET | Freq: Every day | ORAL | Status: DC
  Administered 2018-08-10 – 2018-08-11 (×2): 160 mg via ORAL
  Filled 2018-08-10 (×2): qty 1

## 2018-08-10 MED ORDER — SODIUM CHLORIDE 0.9 % IV BOLUS
1000.0000 mL | Freq: Once | INTRAVENOUS | Status: AC
  Administered 2018-08-10: 09:00:00 1000 mL via INTRAVENOUS

## 2018-08-10 MED ORDER — INSULIN ASPART 100 UNIT/ML ~~LOC~~ SOLN: 0.0000 [IU] | Freq: Three times a day (TID) | SUBCUTANEOUS | Status: DC

## 2018-08-10 MED ORDER — NICOTINE 14 MG/24HR TD PT24: 14.0000 mg | MEDICATED_PATCH | Freq: Every day | TRANSDERMAL | Status: DC | PRN

## 2018-08-10 MED ORDER — MIRTAZAPINE 30 MG PO TABS
30.0000 mg | ORAL_TABLET | Freq: Every day | ORAL | Status: DC
  Administered 2018-08-10: 30 mg via ORAL
  Filled 2018-08-10 (×2): qty 1
  Filled 2018-08-10: qty 2

## 2018-08-10 MED ORDER — INSULIN ASPART 100 UNIT/ML ~~LOC~~ SOLN: 0.0000 [IU] | Freq: Every day | SUBCUTANEOUS | Status: DC

## 2018-08-10 MED ORDER — ONDANSETRON HCL 4 MG/2ML IJ SOLN: 4.0000 mg | Freq: Four times a day (QID) | INTRAMUSCULAR | Status: DC | PRN

## 2018-08-10 MED ORDER — ONDANSETRON HCL 4 MG PO TABS: 4.0000 mg | ORAL_TABLET | Freq: Four times a day (QID) | ORAL | Status: DC | PRN

## 2018-08-10 MED ORDER — ASPIRIN EC 81 MG PO TBEC
81.0000 mg | DELAYED_RELEASE_TABLET | Freq: Every day | ORAL | Status: DC
  Administered 2018-08-11: 09:00:00 81 mg via ORAL
  Filled 2018-08-10: qty 1

## 2018-08-10 MED ORDER — ENOXAPARIN SODIUM 40 MG/0.4ML ~~LOC~~ SOLN
40.0000 mg | SUBCUTANEOUS | Status: DC
  Administered 2018-08-10: 18:00:00 40 mg via SUBCUTANEOUS
  Filled 2018-08-10: qty 0.4

## 2018-08-10 MED ORDER — ACETAMINOPHEN 325 MG PO TABS: 650.0000 mg | ORAL_TABLET | Freq: Four times a day (QID) | ORAL | Status: DC | PRN

## 2018-08-10 MED ORDER — LORAZEPAM 2 MG/ML IJ SOLN
2.0000 mg | Freq: Once | INTRAMUSCULAR | Status: AC
  Administered 2018-08-10: 13:00:00 2 mg via INTRAVENOUS
  Filled 2018-08-10: qty 1

## 2018-08-10 MED ORDER — ERYTHROMYCIN 5 MG/GM OP OINT
1.0000 "application " | TOPICAL_OINTMENT | Freq: Four times a day (QID) | OPHTHALMIC | Status: DC
  Administered 2018-08-10 – 2018-08-11 (×4): 1 via OPHTHALMIC
  Filled 2018-08-10: qty 3.5

## 2018-08-10 MED ORDER — METOPROLOL TARTRATE 25 MG PO TABS
25.0000 mg | ORAL_TABLET | Freq: Two times a day (BID) | ORAL | Status: DC
  Administered 2018-08-10 – 2018-08-11 (×2): 25 mg via ORAL
  Filled 2018-08-10 (×2): qty 1

## 2018-08-10 MED ORDER — RISPERIDONE 3 MG PO TABS
4.0000 mg | ORAL_TABLET | Freq: Every day | ORAL | Status: DC
  Administered 2018-08-10: 4 mg via ORAL
  Filled 2018-08-10: qty 1

## 2018-08-10 MED ORDER — ACETAMINOPHEN 650 MG RE SUPP: 650.0000 mg | Freq: Four times a day (QID) | RECTAL | Status: DC | PRN

## 2018-08-10 MED ORDER — RISPERIDONE 2 MG PO TABS
2.0000 mg | ORAL_TABLET | Freq: Every morning | ORAL | Status: DC
  Administered 2018-08-11: 09:00:00 2 mg via ORAL
  Filled 2018-08-10: qty 1

## 2018-08-10 MED ORDER — SODIUM CHLORIDE 0.9 % IV SOLN
INTRAVENOUS | Status: DC
  Administered 2018-08-10: 19:00:00 via INTRAVENOUS

## 2018-08-10 MED ORDER — SODIUM CHLORIDE 0.9 % IV BOLUS: 500.0000 mL | Freq: Once | INTRAVENOUS | Status: DC

## 2018-08-10 NOTE — ED Triage Notes (Signed)
Pt coming from home today by caretaker. Per caretaker, pt has not slept since Saturday and was worried about pt. Pt presented to ER waiting room with gun and concealed knife. Pt taken into police custody at this time.

## 2018-08-10 NOTE — ED Provider Notes (Signed)
[name] [name] Provider Note   CSN: [id number] Arrival date & time: 08/10/18  3559    History   Chief Complaint Chief Complaint  Patient presents with  . Insomnia  . Aggressive Behavior    HPI [name] [name]. is a 53 y.o. male.     HPI   Pt is a 53 y/o male with a h/o T2DM, HTN, HLD, schizophrenia, who presents to the ED for psychiatric evaluation.  Hx limited as pts speech is garbled and he is difficult to understand.   7:30 AM Discussed case with pts legal guardian, [name] [name].  States that he has cared for the patient since 25-Feb-2023 after his mother died.  States that the patient has had increasingly bizarre behavior this past week.  States that the patient has not slept in 6 days. He has been asking for CBD and supplies to vape which he has never done before. Today, caretaker became increasingly concerned and brought him to the ED for eval. While in the waiting room, the pt pulled a gun out and was pointing it to the ceiling. The pt would then point the gun at whoever was talking to him. Caretaker states he has no idea how the pt got the gun. He states that the patient   Reviewed records. Pts caretaker contacted the patients psychiatrist yesterday stating that the patient was having outburst of crying and that his speech was running together and he was not making sense. The patient also got into a confrontation with his caretaker and has not been sleeping. Psychiatry advised to increase trazodone at bedtime and that if pt got aggressive to bring him to the ED.   Past Medical History:  Diagnosis Date  . Diabetes mellitus type II, uncontrolled ([place])   . HTN (hypertension)   . Hyperlipidemia LDL goal <70   . Schizophrenia [facility])     Patient Active Problem List   Diagnosis Date Noted  . Impacted cerumen of right ear 02/02/2018  . Schizophrenia, paranoid type ([place]) 11/18/2017  . DKA (diabetic ketoacidoses) ([place]) 11/05/2017  . Acute renal  failure superimposed on stage 3 chronic kidney disease ([place]) 11/05/2017  . Type II diabetes mellitus with renal manifestations ([place]) 11/05/2017  . Depression 11/05/2017  . Leukocytosis 11/05/2017  . Cough 11/05/2017  . Tobacco abuse 11/05/2017  . Schizoaffective disorder, bipolar type ([place]) 06/10/2016  . DM (diabetes mellitus) type II uncontrolled, periph vascular disorder ([place]) 02/29/2016  . Schizophrenia, disorganized type ([place]) 06/02/2015  . Hyperlipidemia 09/23/2014  . Renal insufficiency 09/23/2014  . HTN (hypertension) 03/22/2013  . Seasonal allergies 03/22/2013    History reviewed. No pertinent surgical history.     Home Medications    Prior to Admission medications   Medication Sig Start Date End Date Taking? Authorizing Provider  aspirin EC 81 MG tablet Take 1 tablet (81 mg total) by mouth daily. For heart health 12/02/17  Yes Lindell Spar I, NP  fenofibrate 160 MG tablet TAKE 1 TABLET BY MOUTH EVERY DAY Patient taking differently: Take 160 mg by mouth daily.  07/21/18  Yes Lowne Chase, Yvonne R, DO  LANTUS SOLOSTAR 100 UNIT/ML Solostar Pen Inject 60 Units into the skin at bedtime.  05/07/18  Yes [provider]  metFORMIN (GLUCOPHAGE) 500 MG tablet Take 1 tablet (500 mg total) by mouth 2 (two) times daily with a meal. 02/02/18  Yes Carollee Herter, Yvonne R, DO  metoprolol tartrate (LOPRESSOR) 25 MG tablet TAKE 1 TABLET BY MOUTH TWICE A DAY  Patient taking differently: Take 25 mg by mouth daily.  04/19/18  Yes Roma Schanz R, DO  mirtazapine (REMERON) 30 MG tablet Take 1 tablet (30 mg total) by mouth at bedtime. 06/08/18 06/08/19 Yes Arfeen, Arlyce Harman, MD  multivitamin-iron-minerals-folic acid (CENTRUM) chewable tablet Chew 1 tablet by mouth daily. 02/02/18  Yes Lowne Chase, Kendrick Fries R, DO  OVER THE COUNTER MEDICATION CBD OIL  VAPE OIL   Yes [provider]  risperiDONE (RISPERDAL) 2 MG tablet Take 1 tablet (2 mg) qam, take 2 tablets (4 mg) qhs Patient taking  differently: Take 2 mg by mouth See admin instructions. Take t (2 mg) in he morning  (4 mg) in the evening 06/08/18  Yes Arfeen, Arlyce Harman, MD  simvastatin (ZOCOR) 40 MG tablet TAKE 1 TABLET BY MOUTH EVERY DAY Patient taking differently: Take 40 mg by mouth daily at 6 PM.  04/24/18  Yes Carollee Herter, Alferd Apa, DO  traZODone (DESYREL) 100 MG tablet Take 1 tablet (100 mg total) by mouth at bedtime as needed for sleep. 06/08/18  Yes Arfeen, Arlyce Harman, MD  Vitamin D, Ergocalciferol, (DRISDOL) 50000 units CAPS capsule Take 1 capsule (50,000 Units total) by mouth once a week. For bone health Patient taking differently: Take 50,000 Units by mouth once a week. Monday 12/05/17  Yes Lindell Spar I, NP  ACCU-CHEK AVIVA PLUS test strip TEST 4 X DAILY 05/02/18   [provider]  ACCU-CHEK FASTCLIX LANCETS MISC CHECK BLOOD SUGAR 4X DAILY 05/02/18   [provider]  blood glucose meter kit and supplies KIT Dispense based on patient and insurance preference. Use up to four times daily as directed. (FOR ICD-9 250.00, 250.01). 04/06/18   Carollee Herter, Alferd Apa, DO  Fluticasone-Salmeterol (WIXELA INHUB) 250-50 MCG/DOSE AEPB Inhale 1 puff into the lungs 2 (two) times daily. Patient not taking: Reported on 08/10/2018 01/04/18   Carollee Herter, Alferd Apa, DO  Insulin Pen Needle (PEN NEEDLES) 32G X 6 MM MISC Inject 1 each into the skin daily. 05/04/18   Ann Held, DO    Family History Family History  Problem Relation Age of Onset  . Breast cancer Mother   . Diabetes Mother   . Prostate cancer Father   . Hypertension Father   . Breast cancer Maternal Aunt   . Diabetes Maternal Uncle     Social History Social History   Tobacco Use  . Smoking status: Current Every Day Smoker    Packs/day: 1.25    Years: 30.00    Pack years: 37.50    Types: Cigarettes, Cigars  . Smokeless tobacco: Never Used  Substance Use Topics  . Alcohol use: Yes    Comment: occasional beer  . Drug use: Yes    Types:  Marijuana     Allergies   Patient has no known allergies.   Review of Systems Review of Systems  Unable to perform ROS: Psychiatric disorder (speech garbled)     Physical Exam Updated Vital Signs BP (!) 143/87 (BP Location: Left Arm)   Pulse (!) 106   Temp 98.3 F (36.8 C) (Oral)   Resp 18   SpO2 94%   Physical Exam Vitals signs and nursing note reviewed.  Constitutional:      Appearance: He is well-developed.  HENT:     Head: Normocephalic and atraumatic.  Eyes:     General:        Right eye: Discharge present.     Conjunctiva/sclera: Conjunctivae normal.  Pupils: Pupils are equal, round, and reactive to light.  Neck:     Musculoskeletal: Neck supple.  Cardiovascular:     Rate and Rhythm: Normal rate and regular rhythm.     Heart sounds: No murmur.  Pulmonary:     Effort: Pulmonary effort is normal. No respiratory distress.     Breath sounds: Normal breath sounds.  Abdominal:     General: Bowel sounds are normal.     Palpations: Abdomen is soft.     Tenderness: There is no abdominal tenderness.  Musculoskeletal:     Right lower leg: Edema present.     Left lower leg: Edema present.  Skin:    General: Skin is warm and dry.  Neurological:     Mental Status: He is alert.     Comments: Alert, speech is garbled, no facial droop, being all extremities with normal coordination, ambulatory with steady gait.      ED Treatments / Results  Labs (all labs ordered are listed, but only abnormal results are displayed) Labs Reviewed  CBC WITH DIFFERENTIAL/PLATELET - Abnormal; Notable for the following components:      Result Value   WBC 14.5 (*)    Platelets 503 (*)    Neutro Abs 11.8 (*)    Monocytes Absolute 1.3 (*)    All other components within normal limits  BASIC METABOLIC PANEL - Abnormal; Notable for the following components:   Sodium 133 (*)    Chloride 97 (*)    Glucose, Bld 205 (*)    Creatinine, Ser 2.36 (*)    GFR calc non Af Amer 31 (*)     GFR calc Af Amer 36 (*)    All other components within normal limits  URINALYSIS, ROUTINE W REFLEX MICROSCOPIC - Abnormal; Notable for the following components:   Color, Urine STRAW (*)    Specific Gravity, Urine 1.004 (*)    Hgb urine dipstick SMALL (*)    Protein, ur 30 (*)    Bacteria, UA RARE (*)    All other components within normal limits  ACETAMINOPHEN LEVEL - Abnormal; Notable for the following components:   Acetaminophen (Tylenol), Serum <10 (*)    All other components within normal limits  BASIC METABOLIC PANEL - Abnormal; Notable for the following components:   Glucose, Bld 119 (*)    Creatinine, Ser 2.11 (*)    GFR calc non Af Amer 35 (*)    GFR calc Af Amer 41 (*)    All other components within normal limits  I-STAT CREATININE, ED - Abnormal; Notable for the following components:   Creatinine, Ser 2.20 (*)    All other components within normal limits  SARS CORONAVIRUS 2 (HOSPITAL ORDER, [place] [facility])  RAPID URINE DRUG SCREEN, HOSP PERFORMED  ETHANOL  SALICYLATE LEVEL    EKG None  Radiology Ct Head Wo Contrast  Result Date: 08/10/2018 CLINICAL DATA:  Altered mental status. Insomnia. History of schizophrenia EXAM: CT HEAD WITHOUT CONTRAST TECHNIQUE: Contiguous axial images were obtained from the base of the skull through the vertex without intravenous contrast. COMPARISON:  None. FINDINGS: Brain: There is mild diffuse atrophy. There is no intracranial mass, hemorrhage, extra-axial fluid collection, or midline shift. Brain parenchyma appears unremarkable. No evident acute infarct. Vascular: There is no appreciable hyperdense vessel. There is no appreciable vascular calcification. Skull: The bony calvarium appears intact. Sinuses/Orbits: There is mucosal thickening in several ethmoid air cells. Other visualized paranasal sinuses are clear. Frontal sinuses are hypoplastic.  Orbits appear symmetric bilaterally. Other: Mastoid air cells are clear. There  is debris in each external auditory canal. IMPRESSION: Mild diffuse atrophy. The brain parenchyma appears unremarkable. No acute infarct. No mass or hemorrhage. Mucosal thickening noted in several ethmoid air cells. There is probable cerumen in the external auditory canals. Electronically Signed   By: Lowella Grip III M.D.   On: 08/10/2018 08:29    Procedures Procedures (including critical care time)  Medications Ordered in ED Medications  sodium chloride 0.9 % bolus 500 mL (has no administration in time range)  erythromycin ophthalmic ointment 1 application (has no administration in time range)  sodium chloride 0.9 % bolus 1,000 mL (1,000 mLs Intravenous New Bag/Given 08/10/18 0901)  LORazepam (ATIVAN) injection 2 mg (2 mg Intravenous Given 08/10/18 1301)     Initial Impression / Assessment and Plan / ED Course  I have reviewed the triage vital signs and the nursing notes.  Pertinent labs & imaging results that were available during my care of the patient were reviewed by me and considered in my medical decision making (see chart for details).   Final Clinical Impressions(s) / ED Diagnoses   Final diagnoses:  Acute kidney injury superimposed on chronic kidney disease ([place])  Aggressive behavior   Patient presenting for psychiatric evaluation after he was noted by his caretaker to have increased abnormal behavior at home and decreased sleep for the last 6 days.  While waiting in the waiting room, patient pulled out a gun and was pointing at people.  Patient was IVC'ed by myself.  Unable to obtain further history from patient as his speech is garbled and he is not making sense.  Neuro exam is nonfocal.  His caretaker states that this is normal behavior for him when he has exacerbation of his psychiatric illness.  CBC with mild leukocytosis, no anemia BMP with with mild electrolyte derangement, he also has AK I on CKD.  Appears on his prior chart that his baseline creatinine is 1.7  however is elevated to 2.36 today.  Will attempt IV fluid hydration as patient has likely not been eating and drinking recently which could be contributing. EtOH, salicylate and acetaminophen levels are negative. Coronavirus testing is negative. Is negative UA with hematuria, proteinuria and rare bacteria.  Not suggestive of urinary tract infection. Repeat creatinine mains elevated at 2.2.  Will plan for admission for AK I on CKD.  CT head is negative for any acute intracranial abnormality.  Patient was evaluated by the psychiatric team who recommended inpatient placement.  1:30 PM consult with Dr. Lorin Mercy for admission for AKI.  She recommended ordering a BMP rather than an i-STAT creatinine. If persistent AKI then reconsult.   Repeat BMP with persistently elevated creatinine.   2:30 PM Discussed case with Dr. Lorin Mercy who accepts pt for admission.   ED Discharge Orders    None       Bishop [name] 08/10/18 1432    Elnora Morrison, MD 08/10/18 1549

## 2018-08-10 NOTE — ED Notes (Signed)
Pt found getting out of bed, pt while attempting to get out of bed ripped out IV. Gauze dressing applied.

## 2018-08-10 NOTE — Progress Notes (Signed)
Bilateral lower extremities noted to be warm to touch, and red. Dr. Karmen Bongo notified of the findings but not orders were provided. Pt currently asymptomatic, showing no signs of distress. RN to continue to monitor .

## 2018-08-10 NOTE — H&P (Signed)
Please see note from earlier today, written by me.  That note was inadvertently entered as a Consult Note, rather than an H&P.

## 2018-08-10 NOTE — Consult Note (Signed)
History and Physical    [name] [name]. FYB:[id number] DOB: 04-11-1965 DOA: 08/10/2018  PCP: Ann Held, DO Consultants:  Adele Schilder - psychiatry Patient coming from:  Home - lives with caretaker; NOK: Legal guardian, [name] [phone]   Chief Complaint: Psychosis  HPI: [name] [name]. is a 53 y.o. male with medical history significant of DM; HTN; HLD; and schizophrenia presenting with  Psychosis. Caretaker reported that he had not slept since Sunday.  Patient was found to have a gun that he was pointing around in the lobby and also a gun, and [employer] took him into custody.  The patient was unable to provide history at the time of admission.  He was given sedatives prior to my evaluation and was obtunded.   ED Course:   Psych eval - increasingly agitated per caregiver.  IVC done after gun and knife in waiting room.  AKI - given bolus.  Will recheck BMP and if normalizing can go to inpatient [facility] today.  If not normalizing, can observe overnight for IV hydration.  Review of Systems: Unable to perform  PMH, PSH, SH, and FH were reviewed in Epic  Past Medical History:  Diagnosis Date   Diabetes mellitus type II, uncontrolled ([place])    HTN (hypertension)    Hyperlipidemia LDL goal <70    Schizophrenia ([place])     History reviewed. No pertinent surgical history.  Social History   Socioeconomic History   Marital status: Single    Spouse name: Not on file   Number of children: Not on file   Years of education: Not on file   Highest education level: Not on file  Occupational History   Not on file  Social Needs   Financial resource strain: Not on file   Food insecurity:    Worry: Not on file    Inability: Not on file   Transportation needs:    Medical: Not on file    Non-medical: Not on file  Tobacco Use   Smoking status: Current Every Day Smoker    Packs/day: 1.25    Years: 30.00    Pack years: 37.50    Types: Cigarettes, Cigars   Smokeless  tobacco: Never Used  Substance and Sexual Activity   Alcohol use: Yes    Comment: occasional beer   Drug use: Yes    Types: Marijuana   Sexual activity: Never  Lifestyle   Physical activity:    Days per week: Not on file    Minutes per session: Not on file   Stress: Not on file  Relationships   Social connections:    Talks on phone: Not on file    Gets together: Not on file    Attends religious service: Not on file    Active member of club or organization: Not on file    Attends meetings of clubs or organizations: Not on file    Relationship status: Not on file   Intimate partner violence:    Fear of current or ex partner: Not on file    Emotionally abused: Not on file    Physically abused: Not on file    Forced sexual activity: Not on file  Other Topics Concern   Not on file  Social History Narrative   Lives with mom   unemployed   No children    No Known Allergies  Family History  Problem Relation Age of Onset   Breast cancer Mother    Diabetes Mother    Prostate cancer  Father    Hypertension Father    Breast cancer Maternal Aunt    Diabetes Maternal Uncle     Prior to Admission medications   Medication Sig Start Date End Date Taking? Authorizing Provider  aspirin EC 81 MG tablet Take 1 tablet (81 mg total) by mouth daily. For heart health 12/02/17  Yes Lindell Spar I, NP  fenofibrate 160 MG tablet TAKE 1 TABLET BY MOUTH EVERY DAY Patient taking differently: Take 160 mg by mouth daily.  07/21/18  Yes Lowne Chase, Yvonne R, DO  LANTUS SOLOSTAR 100 UNIT/ML Solostar Pen Inject 60 Units into the skin at bedtime.  05/07/18  Yes [provider]  metFORMIN (GLUCOPHAGE) 500 MG tablet Take 1 tablet (500 mg total) by mouth 2 (two) times daily with a meal. 02/02/18  Yes Carollee Herter, Yvonne R, DO  metoprolol tartrate (LOPRESSOR) 25 MG tablet TAKE 1 TABLET BY MOUTH TWICE A DAY Patient taking differently: Take 25 mg by mouth daily.  04/19/18  Yes Roma Schanz R, DO  mirtazapine (REMERON) 30 MG tablet Take 1 tablet (30 mg total) by mouth at bedtime. 06/08/18 06/08/19 Yes Arfeen, Arlyce Harman, MD  multivitamin-iron-minerals-folic acid (CENTRUM) chewable tablet Chew 1 tablet by mouth daily. 02/02/18  Yes Lowne Chase, Kendrick Fries R, DO  OVER THE COUNTER MEDICATION CBD OIL  VAPE OIL   Yes [provider]  risperiDONE (RISPERDAL) 2 MG tablet Take 1 tablet (2 mg) qam, take 2 tablets (4 mg) qhs Patient taking differently: Take 2 mg by mouth See admin instructions. Take t (2 mg) in he morning  (4 mg) in the evening 06/08/18  Yes Arfeen, Arlyce Harman, MD  simvastatin (ZOCOR) 40 MG tablet TAKE 1 TABLET BY MOUTH EVERY DAY Patient taking differently: Take 40 mg by mouth daily at 6 PM.  04/24/18  Yes Carollee Herter, Alferd Apa, DO  traZODone (DESYREL) 100 MG tablet Take 1 tablet (100 mg total) by mouth at bedtime as needed for sleep. 06/08/18  Yes Arfeen, Arlyce Harman, MD  Vitamin D, Ergocalciferol, (DRISDOL) 50000 units CAPS capsule Take 1 capsule (50,000 Units total) by mouth once a week. For bone health Patient taking differently: Take 50,000 Units by mouth once a week. Monday 12/05/17  Yes Lindell Spar I, NP  ACCU-CHEK AVIVA PLUS test strip TEST 4 X DAILY 05/02/18   [provider]  ACCU-CHEK FASTCLIX LANCETS MISC CHECK BLOOD SUGAR 4X DAILY 05/02/18   [provider]  blood glucose meter kit and supplies KIT Dispense based on patient and insurance preference. Use up to four times daily as directed. (FOR ICD-9 250.00, 250.01). 04/06/18   Carollee Herter, Alferd Apa, DO  Fluticasone-Salmeterol (WIXELA INHUB) 250-50 MCG/DOSE AEPB Inhale 1 puff into the lungs 2 (two) times daily. Patient not taking: Reported on 08/10/2018 01/04/18   Carollee Herter, Alferd Apa, DO  Insulin Pen Needle (PEN NEEDLES) 32G X 6 MM MISC Inject 1 each into the skin daily. 05/04/18   Ann Held, DO    Physical Exam: Vitals:   08/10/18 1430 08/10/18 1445 08/10/18 1500 08/10/18 1659  BP: 107/73  114/75 113/72 (!) 135/97  Pulse: 95 96 96 (!) 101  Resp:    20  Temp:    98.8 F (37.1 C)  TempSrc:    Oral  SpO2: 97% 97% 96% 96%      General:  Obtunded, snoring with significant upper airway noise and desats to the upper 80s while sleeping.  Eyes:   normal lids, iris  ENT:  normal lips & tongue, mmm  Neck:  no LAD, masses or thyromegaly  Cardiovascular:  RRR, no m/r/g. 2+ LE edema.   Respiratory:   CTA bilaterally with no wheezes/rales/rhonchi other than significant upper airway noise  Normal respiratory effort.  Abdomen:  soft, NT, ND, NABS  Skin:  no rash or induration seen on limited exam  Musculoskeletal:  grossly normal tone BUE/BLE, good ROM, no bony abnormality  Psychiatric:  Obtunded, snoring comfortably.  However, per nurse tech report, the patient will periodically jolt awake and tear off all of his cords and wires  Neurologic: unable to perform    Radiological Exams on Admission: Ct Head Wo Contrast  Result Date: 08/10/2018 CLINICAL DATA:  Altered mental status. Insomnia. History of schizophrenia EXAM: CT HEAD WITHOUT CONTRAST TECHNIQUE: Contiguous axial images were obtained from the base of the skull through the vertex without intravenous contrast. COMPARISON:  None. FINDINGS: Brain: There is mild diffuse atrophy. There is no intracranial mass, hemorrhage, extra-axial fluid collection, or midline shift. Brain parenchyma appears unremarkable. No evident acute infarct. Vascular: There is no appreciable hyperdense vessel. There is no appreciable vascular calcification. Skull: The bony calvarium appears intact. Sinuses/Orbits: There is mucosal thickening in several ethmoid air cells. Other visualized paranasal sinuses are clear. Frontal sinuses are hypoplastic. Orbits appear symmetric bilaterally. Other: Mastoid air cells are clear. There is debris in each external auditory canal. IMPRESSION: Mild diffuse atrophy. The brain parenchyma appears unremarkable. No acute  infarct. No mass or hemorrhage. Mucosal thickening noted in several ethmoid air cells. There is probable cerumen in the external auditory canals. Electronically Signed   By: Lowella Grip III M.D.   On: 08/10/2018 08:29    EKG: Not done   Labs on Admission: I have personally reviewed the available labs and imaging studies at the time of the admission.  Pertinent labs:   Glugose 205 BUN 16/Creatinine 2.36/GFR 36; 10/1.65/54 on 1/23 WBC 14.5 Platelets 503 UA: small Hgb, 30 protein Negative ETOH Negative ASA Negative UDS COVID negative  Assessment/Plan Principal Problem:   Acute renal failure superimposed on stage 3 chronic kidney disease (HCC) Active Problems:   HTN (hypertension)   Hyperlipidemia   DM (diabetes mellitus) type II uncontrolled, periph vascular disorder (HCC)   Tobacco abuse   Schizophrenia, paranoid type ([place])   Acute renal failure on stage 3 CKD -Baseline creatinine is 1.65 -Today's creatinine is  2.36, not improved with gentle IVF hydration in the ER -Likely due to prerenal failure secondary to dehydration and continuation of metformin --Follow up renal function by BMP -Avoid ACEI and NSAIDs -Currently giving IVF for repletion, but this may need to be adjusted pending BNP/CXR result (see below) -Will observe overnight  Hypoxia -Patient give sedatives and then had hypoxia while sleeping -Suspect OHS/OSA as the cause -He has significant upper airway noise -He does have LE edema -Will order BNP and CXR since CHF should be considered; if concern for CHF, will need transfer to telemetry and Echo  Schizophrenia -Patient with a h/o psychosis -He has not been sleeping recently -He had a gun and knife in the ER -Patient is a danger to himself and others and has been recommended for inpatient behavioral health treatment -He will be [facility] to [facility] following this hospitalization -Continue psych meds: Remeron, Trazodone, Risperdal  HTN -Continue  Lopressor  HLD -Continue Zocor  DM -A1c was 6.6 in 1/23 -hold Glucophage -Cover with moderate-scale SSI  Tobacco dependence -Encourage cessation -Will order patch prn  Note: This patient has been tested and is negative for the novel coronavirus COVID-19.  DVT prophylaxis:  Lovenox Code Status:  Full  Family Communication: None present; the ER staff has spoken with his Legal Guardian  Disposition Plan: Inpatient [place] called: Psych Admission status: It is my clinical opinion that referral for OBSERVATION is reasonable and necessary in this patient based on the above information provided. The aforementioned taken together are felt to place the patient at high risk for further clinical deterioration. However it is anticipated that the patient may be medically stable for discharge from the hospital within 24 to 48 hours.    Karmen Bongo MD [facility]   How to contact the [facility] Attending or Consulting provider [place] or covering provider during after hours [place], for this patient?  1. Check the care team in [facility] and look for a) attending/consulting TRH provider listed and b) the [facility] team listed 2. Log into [email or web address] and use [place]'s universal password to access. If you do not have the password, please contact the hospital operator. 3. Locate the [facility] provider you are looking for under [facility] and page to a number that you can be directly reached. 4. If you still have difficulty reaching the provider, please page the [facility] (Director on Call) for the Hospitalists listed on amion for assistance.   08/10/2018, 5:29 PM

## 2018-08-10 NOTE — BH Assessment (Signed)
Tele Assessment Note   Patient Name: [name] [name]. MRN: [id number] Referring Physician: Dr. Reather Converse Location of Patient: [facility] Location of Provider: [place]. is an 53 y.o. male. Per Epic notes that Pt was brought in by his caregiver. The Pt then was found to have a gun in his possession in the ED. The Pt has a past hx of psychosis.  Collateral Contact- From Pt's legal guardian [name]. Per Mr. Tamala Julian Pt's psychiatric needs are usually maintained but this past week his behavior has changed drastically. Mr. Tamala Julian stated that the Pt's speech is usually clear and understandable but he began talking incoherent. Per Mr.  Tamala Julian he feels the Pt's behavior changed due to the Pt's uncles being a negative influence on him and providing the Pt with illegal Mr. Tamala Julian feels the Pt is currently a danger to himself and others due to his drastic change in behavior.   Tinnie Gens, NP recommends inpatient treatment. TTS will seek placement.  Diagnosis:  F20.9 Schizophrenia  Past Medical History:  Past Medical History:  Diagnosis Date  . Diabetes mellitus type II, uncontrolled ([place])   . HTN (hypertension)   . Hyperlipidemia LDL goal <70   . Schizophrenia ([place])     History reviewed. No pertinent surgical history.  Family History:  Family History  Problem Relation Age of Onset  . Breast cancer Mother   . Diabetes Mother   . Prostate cancer Father   . Hypertension Father   . Breast cancer Maternal Aunt   . Diabetes Maternal Uncle     Social History:  reports that he has been smoking cigarettes and cigars. He has a 37.50 pack-year smoking history. He has never used smokeless tobacco. He reports current alcohol use. He reports current drug use. Drug: Marijuana.  Additional Social History:  Alcohol / Drug Use Pain Medications: please see mar Prescriptions: please see mar Over the Counter: please see mar History of alcohol / drug use?: No  history of alcohol / drug abuse Longest period of sobriety (when/how long): NA  CIWA: CIWA-Ar BP: 121/75 Pulse Rate: (!) 125 COWS:    Allergies: No Known Allergies  Home Medications: (Not in a hospital admission)   OB/GYN Status:  No LMP for male patient.  General Assessment Data TTS Assessment: In system Is this a Tele or Face-to-Face Assessment?: Tele Assessment Is this an Initial Assessment or a Re-assessment for this encounter?: Initial Assessment Patient Accompanied by:: Other Language Other than English: No Living Arrangements: Other (Comment) What gender do you identify as?: Male Marital status: Single Maiden name: NA Pregnancy Status: No Living Arrangements: Other relatives Can pt return to current living arrangement?: Yes Admission Status: Involuntary Petitioner: Police Is patient capable of signing voluntary admission?: No Referral Source: Self/Family/Friend Insurance type: Medicare     Crisis Care Plan Living Arrangements: Other relatives Legal Guardian: Other:(self) Name of Psychiatrist: Dr. Adele Schilder Name of Therapist: NA  Education Status Is patient currently in school?: No Is the patient employed, unemployed or receiving disability?: Receiving disability income  Risk to self with the past 6 months Suicidal Ideation: No Has patient been a risk to self within the past 6 months prior to admission? : No Suicidal Intent: No Has patient had any suicidal intent within the past 6 months prior to admission? : No Is patient at risk for suicide?: No Suicidal Plan?: No Has patient had any suicidal plan within the past 6 months prior to admission? : No Access to Means:  No What has been your use of drugs/alcohol within the last 12 months?: NA Previous Attempts/Gestures: No How many times?: 0 Other Self Harm Risks: NA Triggers for Past Attempts: Unknown Intentional Self Injurious Behavior: None Family Suicide History: Unable to assess Recent stressful life  event(s): Other (Comment) Persecutory voices/beliefs?: No Depression: No Depression Symptoms: (unable to access) Substance abuse history and/or treatment for substance abuse?: No Suicide prevention information given to non-admitted patients: Not applicable  Risk to Others within the past 6 months Homicidal Ideation: No-Not Currently/Within Last 6 Months Does patient have any lifetime risk of violence toward others beyond the six months prior to admission? : No Thoughts of Harm to Others: No(unknown but brought a gun to the ED) Current Homicidal Intent: No(unknown but brought a gun to ED) Current Homicidal Plan: No Access to Homicidal Means: Yes Describe Access to Homicidal Means: access to a gun Identified Victim: NA History of harm to others?: No Assessment of Violence: None Noted Violent Behavior Description: NA Does patient have access to weapons?: No Criminal Charges Pending?: No Does patient have a court date: No Is patient on probation?: No  Psychosis Hallucinations: None noted Delusions: None noted  Mental Status Report Appearance/Hygiene: Unable to Assess Eye Contact: Unable to Assess Motor Activity: Freedom of movement Speech: Unable to assess Level of Consciousness: Alert Mood: Labile Affect: Blunted Anxiety Level: Minimal Thought Processes: Unable to Assess Judgement: Unable to Assess Orientation: Unable to assess Obsessive Compulsive Thoughts/Behaviors: Unable to Assess  Cognitive Functioning Concentration: Unable to Assess Memory: Unable to Assess Is patient IDD: No Insight: Unable to Assess Impulse Control: Unable to Assess Appetite: Fair Have you had any weight changes? : No Change Sleep: Unable to Assess Total Hours of Sleep: 8 Vegetative Symptoms: None  ADLScreening [facility] Assessment Services) Patient's cognitive ability adequate to safely complete daily activities?: No Patient able to express need for assistance with ADLs?: No Independently  performs ADLs?: No  Prior Inpatient Therapy Prior Inpatient Therapy: Yes Prior Therapy Dates: unknown Prior Therapy Facilty/Provider(s): unknown Reason for Treatment: unknown  Prior Outpatient Therapy Prior Outpatient Therapy: Yes Prior Therapy Dates: current Prior Therapy Facilty/Provider(s): Dr. Adele Schilder Reason for Treatment: psychosis Does patient have an ACCT team?: No Does patient have Intensive In-House Services?  : No Does patient have [facility] services? : No Does patient have P4CC services?: No  ADL Screening (condition at time of admission) Patient's cognitive ability adequate to safely complete daily activities?: No Is the patient deaf or have difficulty hearing?: No Does the patient have difficulty seeing, even when wearing glasses/contacts?: Yes Does the patient have difficulty concentrating, remembering, or making decisions?: Yes Patient able to express need for assistance with ADLs?: No Does the patient have difficulty dressing or bathing?: Yes Independently performs ADLs?: No Communication: Needs assistance Dressing (OT): Needs assistance Grooming: Needs assistance Feeding: Independent Bathing: Needs assistance Toileting: Needs assistance In/Out Bed: Needs assistance Walks in Home: Needs assistance Does the patient have difficulty walking or climbing stairs?: No       Abuse/Neglect Assessment (Assessment to be complete while patient is alone) Abuse/Neglect Assessment Can Be Completed: Yes Physical Abuse: Denies Verbal Abuse: Denies Sexual Abuse: Denies Exploitation of patient/patient's resources: Denies     Regulatory affairs officer (For Healthcare) Does Patient Have a Medical Advance Directive?: No Would patient like information on creating a medical advance directive?: No - Patient declined          Disposition:  Disposition Initial Assessment Completed for this Encounter: Yes  This service was provided  via telemedicine using a 2-way, interactive  audio and Radiographer, therapeutic.  Names of all persons participating in this telemedicine service and their role in this encounter. Name: [name] Role: Legal [place]  Name:  Role:   Name:  Role:   Name:  Role:     Cyndia Bent 08/10/2018 10:54 AM

## 2018-08-10 NOTE — ED Notes (Signed)
Legal guardian [name]) left number for contact [phone]

## 2018-08-10 NOTE — Progress Notes (Signed)
CSW contacted PA, Cortini Coutre, to ask if patient's ED Provider note was going to be completed and signed.  PA notes that patient's creatinine levels are off and she is considering admitting him to a medical unit of the hospital.    CSW advised that patient consult would be closed out once he is medically admitted.  Medical Unit can request a Psychiatric consult (as opposed to a TTS consult) once patient is medically cleared.  Areatha Keas. Judi Cong, MSW, [place] Disposition Clinical Social Work [phone] (cell) [phone] (office)

## 2018-08-10 NOTE — Discharge Planning (Signed)
EDCM following for disposition needs as TTS recommends Inpatient Psych.

## 2018-08-10 NOTE — ED Notes (Addendum)
ED TO INPATIENT HANDOFF REPORT  ED Nurse Name and Phone #: Thurmond Butts [place] Name/Age/Gender [name] [name]. 53 y.o. male Room/Bed: 029C/029C  Code Status   Code Status: Full Code  Home/SNF/Other Home  Is this baseline? No   Triage Complete: Triage complete  Chief Complaint IVC  Triage Note Pt coming from home today by caretaker. Per caretaker, pt has not slept since Saturday and was worried about pt. Pt presented to ER waiting room with gun and concealed knife. Pt taken into police custody at this time.   Allergies No Known Allergies  Level of Care/Admitting Diagnosis ED Disposition    ED Disposition Condition [facility]: [place] [[id number]]  Level of Care: Med-Surg [16]  I expect the patient will be discharged within 24 hours: Yes  LOW acuity---Tx typically complete <24 hrs---ACUTE conditions typically can be evaluated <24 hours---LABS likely to return to acceptable levels <24 hours---IS near functional baseline---EXPECTED to return to current living arrangement---NOT newly hypoxic: Meets criteria for 5C-Observation unit  Covid Evaluation: Screening Protocol (No Symptoms)  Diagnosis: Acute kidney injury [facility]) [[id number]]  Admitting Physician: Karmen Bongo [[id number]]  Attending Physician: Karmen Bongo [[id number]]  PT Class (Do Not Modify): Observation [104]  PT Acc Code (Do Not Modify): Observation [10022]       B Medical/Surgery History Past Medical History:  Diagnosis Date  . Diabetes mellitus type II, uncontrolled ([place])   . HTN (hypertension)   . Hyperlipidemia LDL goal <70   . Schizophrenia ([place])    History reviewed. No pertinent surgical history.   A IV Location/Drains/Wounds Patient Lines/Drains/Airways Status   Active Line/Drains/Airways    None          Intake/Output Last 24 hours No intake or output data in the 24 hours ending 08/10/18 1559  Labs/Imaging Results for orders placed or performed  during the hospital encounter of 08/10/18 (from the past 48 hour(s))  CBC with Differential     Status: Abnormal   Collection Time: 08/10/18  7:44 AM  Result Value Ref Range   WBC 14.5 (H) 4.0 - 10.5 K/uL   RBC 4.67 4.22 - 5.81 MIL/uL   Hemoglobin 13.3 13.0 - 17.0 g/dL   HCT 41.2 39.0 - 52.0 %   MCV 88.2 80.0 - 100.0 fL   MCH 28.5 26.0 - 34.0 pg   MCHC 32.3 30.0 - 36.0 g/dL   RDW 14.3 11.5 - 15.5 %   Platelets 503 (H) 150 - 400 K/uL   nRBC 0.0 0.0 - 0.2 %   Neutrophils Relative % 81 %   Neutro Abs 11.8 (H) 1.7 - 7.7 K/uL   Lymphocytes Relative 8 %   Lymphs Abs 1.2 0.7 - 4.0 K/uL   Monocytes Relative 9 %   Monocytes Absolute 1.3 (H) 0.1 - 1.0 K/uL   Eosinophils Relative 1 %   Eosinophils Absolute 0.2 0.0 - 0.5 K/uL   Basophils Relative 0 %   Basophils Absolute 0.0 0.0 - 0.1 K/uL   Immature Granulocytes 1 %   Abs Immature Granulocytes 0.07 0.00 - 0.07 K/uL    Comment: Performed at [facility], [address], [place], [place] [address]  Basic metabolic panel     Status: Abnormal   Collection Time: 08/10/18  7:44 AM  Result Value Ref Range   Sodium 133 (L) 135 - 145 mmol/L   Potassium 3.8 3.5 - 5.1 mmol/L   Chloride 97 (L) 98 - 111 mmol/L  CO2 23 22 - 32 mmol/L   Glucose, Bld 205 (H) 70 - 99 mg/dL   BUN 16 6 - 20 mg/dL   Creatinine, Ser 2.36 (H) 0.61 - 1.24 mg/dL   Calcium 9.4 8.9 - 10.3 mg/dL   GFR calc non Af Amer 31 (L) >60 mL/min   GFR calc Af Amer 36 (L) >60 mL/min   Anion gap 13 5 - 15    Comment: Performed at [facility] [address] [place], [place] [address]  Ethanol     Status: None   Collection Time: 08/10/18  7:44 AM  Result Value Ref Range   Alcohol, Ethyl (B) <10 <10 mg/dL    Comment: (NOTE) Lowest detectable limit for serum alcohol is 10 mg/dL. For medical purposes only. Performed at [facility], [name] [address], [place], [place] [address]   Salicylate level     Status: None   Collection Time: 08/10/18  7:44 AM  Result Value  Ref Range   Salicylate Lvl <5.2 2.8 - 30.0 mg/dL    Comment: Performed at [facility] [address] [place], [place] [address]  Acetaminophen level     Status: Abnormal   Collection Time: 08/10/18  7:44 AM  Result Value Ref Range   Acetaminophen (Tylenol), Serum <10 (L) 10 - 30 ug/mL    Comment: (NOTE) Therapeutic concentrations vary significantly. A range of 10-30 ug/mL  may be an effective concentration for many patients. However, some  are best treated at concentrations outside of this range. Acetaminophen concentrations >150 ug/mL at 4 hours after ingestion  and >50 ug/mL at 12 hours after ingestion are often associated with  toxic reactions. Performed at [facility], [place] [address], [place], [place] [address]   SARS Coronavirus 2 (CEPHEID - Performed in [facility] hospital lab), Hosp Order     Status: None   Collection Time: 08/10/18  8:51 AM  Result Value Ref Range   SARS Coronavirus 2 NEGATIVE NEGATIVE    Comment: (NOTE) If result is NEGATIVE SARS-CoV-2 target nucleic acids are NOT DETECTED. The SARS-CoV-2 RNA is generally detectable in upper and lower  respiratory specimens during the acute phase of infection. The lowest  concentration of SARS-CoV-2 viral copies this assay can detect is 250  copies / mL. A negative result does not preclude SARS-CoV-2 infection  and should not be used as the sole basis for treatment or other  patient management decisions.  A negative result may occur with  improper specimen collection / handling, submission of specimen other  than nasopharyngeal swab, presence of viral mutation(s) within the  areas targeted by this assay, and inadequate number of viral copies  (<250 copies / mL). A negative result must be combined with clinical  observations, patient history, and epidemiological information. If result is POSITIVE SARS-CoV-2 target nucleic acids are DETECTED. The SARS-CoV-2 RNA is generally detectable in upper and lower   respiratory specimens dur ing the acute phase of infection.  Positive  results are indicative of active infection with SARS-CoV-2.  Clinical  correlation with patient history and other diagnostic information is  necessary to determine patient infection status.  Positive results do  not rule out bacterial infection or co-infection with other viruses. If result is PRESUMPTIVE POSTIVE SARS-CoV-2 nucleic acids MAY BE PRESENT.   A presumptive positive result was obtained on the submitted specimen  and confirmed on repeat testing.  While 2019 novel coronavirus  (SARS-CoV-2) nucleic acids may be present  in the submitted sample  additional confirmatory testing may be necessary for epidemiological  and / or clinical management purposes  to differentiate between  SARS-CoV-2 and other Sarbecovirus currently known to infect humans.  If clinically indicated additional testing with an alternate test  methodology [phone]) is advised. The SARS-CoV-2 RNA is generally  detectable in upper and lower respiratory sp ecimens during the acute  phase of infection. The expected result is Negative. Fact Sheet for Patients:  StrictlyIdeas.no Fact Sheet for Healthcare Providers: BankingDealers.co.za This test is not yet approved or cleared by the [place] FDA and has been authorized for detection and/or diagnosis of SARS-CoV-2 by FDA under an Emergency Use Authorization (EUA).  This EUA will remain in effect (meaning this test can be used) for the duration of the COVID-19 declaration under Section 564(b)(1) of the Act, 21 U.S.C. section 360bbb-3(b)(1), unless the authorization is terminated or revoked sooner. Performed at [facility], [place] [address] [place], [place] [address]   Urinalysis, Routine w reflex microscopic     Status: Abnormal   Collection Time: 08/10/18 11:27 AM  Result Value Ref Range   Color, Urine STRAW (A) YELLOW   APPearance  CLEAR CLEAR   Specific Gravity, Urine 1.004 (L) 1.005 - 1.030   pH 7.0 5.0 - 8.0   Glucose, UA NEGATIVE NEGATIVE mg/dL   Hgb urine dipstick SMALL (A) NEGATIVE   Bilirubin Urine NEGATIVE NEGATIVE   Ketones, ur NEGATIVE NEGATIVE mg/dL   Protein, ur 30 (A) NEGATIVE mg/dL   Nitrite NEGATIVE NEGATIVE   Leukocytes,Ua NEGATIVE NEGATIVE   RBC / HPF 0-5 0 - 5 RBC/hpf   Bacteria, UA RARE (A) NONE SEEN    Comment: Performed at [facility] [address] [place], [place] [address]  Rapid urine drug screen (hospital performed)     Status: None   Collection Time: 08/10/18 11:28 AM  Result Value Ref Range   Opiates NONE DETECTED NONE DETECTED   Cocaine NONE DETECTED NONE DETECTED   Benzodiazepines NONE DETECTED NONE DETECTED   Amphetamines NONE DETECTED NONE DETECTED   Tetrahydrocannabinol NONE DETECTED NONE DETECTED   Barbiturates NONE DETECTED NONE DETECTED    Comment: (NOTE) DRUG SCREEN FOR MEDICAL PURPOSES ONLY.  IF CONFIRMATION IS NEEDED FOR ANY PURPOSE, NOTIFY LAB WITHIN 5 DAYS. LOWEST DETECTABLE LIMITS FOR URINE DRUG SCREEN Drug Class                     Cutoff (ng/mL) Amphetamine and metabolites    1000 Barbiturate and metabolites    200 Benzodiazepine                 474 Tricyclics and metabolites     300 Opiates and metabolites        300 Cocaine and metabolites        300 THC                            50 Performed at [facility], [place] [address] [place], [place] [address]   I-Stat Creatinine, ED (not at [facility])     Status: Abnormal   Collection Time: 08/10/18 12:35 PM  Result Value Ref Range   Creatinine, Ser 2.20 (H) 0.61 - 1.24 mg/dL  Basic metabolic panel     Status: Abnormal   Collection Time: 08/10/18  1:35 PM  Result Value Ref Range   Sodium 136 135 - 145 mmol/L  Potassium 3.7 3.5 - 5.1 mmol/L   Chloride 103 98 - 111 mmol/L   CO2 22 22 - 32 mmol/L   Glucose, Bld 119 (H) 70 - 99 mg/dL   BUN 12 6 - 20 mg/dL   Creatinine, Ser 2.11 (H) 0.61 - 1.24  mg/dL   Calcium 9.1 8.9 - 10.3 mg/dL   GFR calc non Af Amer 35 (L) >60 mL/min   GFR calc Af Amer 41 (L) >60 mL/min   Anion gap 11 5 - 15    Comment: Performed at [facility] [address] [place], [place] [address]   Ct Head Wo Contrast  Result Date: 08/10/2018 CLINICAL DATA:  Altered mental status. Insomnia. History of schizophrenia EXAM: CT HEAD WITHOUT CONTRAST TECHNIQUE: Contiguous axial images were obtained from the base of the skull through the vertex without intravenous contrast. COMPARISON:  None. FINDINGS: Brain: There is mild diffuse atrophy. There is no intracranial mass, hemorrhage, extra-axial fluid collection, or midline shift. Brain parenchyma appears unremarkable. No evident acute infarct. Vascular: There is no appreciable hyperdense vessel. There is no appreciable vascular calcification. Skull: The bony calvarium appears intact. Sinuses/Orbits: There is mucosal thickening in several ethmoid air cells. Other visualized paranasal sinuses are clear. Frontal sinuses are hypoplastic. Orbits appear symmetric bilaterally. Other: Mastoid air cells are clear. There is debris in each external auditory canal. IMPRESSION: Mild diffuse atrophy. The brain parenchyma appears unremarkable. No acute infarct. No mass or hemorrhage. Mucosal thickening noted in several ethmoid air cells. There is probable cerumen in the external auditory canals. Electronically Signed   By: Lowella Grip III M.D.   On: 08/10/2018 08:29    Pending Labs Unresulted Labs (From admission, onward)    Start     Ordered   08/11/18 6761  Basic metabolic panel  Tomorrow morning,   R     08/10/18 1531   08/11/18 0500  CBC  Tomorrow morning,   R     08/10/18 1531          Vitals/Pain Today's Vitals   08/10/18 1400 08/10/18 1415 08/10/18 1430 08/10/18 1445  BP: 101/81 111/77 107/73 114/75  Pulse: 98 96 95 96  Resp:      Temp:      TempSrc:      SpO2: 96% 95% 97% 97%  PainSc:        Isolation  Precautions No active isolations  Medications Medications  erythromycin ophthalmic ointment 1 application (has no administration in time range)  aspirin EC tablet 81 mg (has no administration in time range)  fenofibrate tablet 160 mg (has no administration in time range)  metoprolol tartrate (LOPRESSOR) tablet 25 mg (has no administration in time range)  simvastatin (ZOCOR) tablet 40 mg (has no administration in time range)  mirtazapine (REMERON) tablet 30 mg (has no administration in time range)  risperiDONE (RISPERDAL) tablet 2 mg (has no administration in time range)  traZODone (DESYREL) tablet 100 mg (has no administration in time range)  Insulin Glargine (LANTUS) Solostar Pen 60 Units (has no administration in time range)  enoxaparin (LOVENOX) injection 40 mg (has no administration in time range)  acetaminophen (TYLENOL) tablet 650 mg (has no administration in time range)    Or  acetaminophen (TYLENOL) suppository 650 mg (has no administration in time range)  docusate sodium (COLACE) capsule 100 mg (has no administration in time range)  ondansetron (ZOFRAN) tablet 4 mg (has no administration in time range)    Or  ondansetron (ZOFRAN) injection 4  mg (has no administration in time range)  insulin aspart (novoLOG) injection 0-15 Units (has no administration in time range)  0.9 %  sodium chloride infusion (has no administration in time range)  insulin aspart (novoLOG) injection 0-5 Units (has no administration in time range)  sodium chloride 0.9 % bolus 1,000 mL (1,000 mLs Intravenous New Bag/Given 08/10/18 0901)  LORazepam (ATIVAN) injection 2 mg (2 mg Intravenous Given 08/10/18 1301)    Mobility walks High fall risk   Focused Assessments    R Recommendations: See Admitting Provider Note  Report given to: Bennie Pierini RN  Additional Notes:

## 2018-08-11 ENCOUNTER — Other Ambulatory Visit: Payer: Self-pay

## 2018-08-11 ENCOUNTER — Inpatient Hospital Stay (HOSPITAL_COMMUNITY): Payer: Medicare HMO

## 2018-08-11 ENCOUNTER — Observation Stay (HOSPITAL_BASED_OUTPATIENT_CLINIC_OR_DEPARTMENT_OTHER): Payer: Medicare HMO

## 2018-08-11 ENCOUNTER — Inpatient Hospital Stay (HOSPITAL_COMMUNITY)
Admission: AD | Admit: 2018-08-11 | Discharge: 2018-08-11 | Disposition: A | Discharge status: Still In Hospital | Payer: Medicare HMO | Attending: Psychiatry | Admitting: Psychiatry

## 2018-08-11 DIAGNOSIS — G9341 Metabolic encephalopathy: Secondary | ICD-10-CM | POA: Diagnosis present

## 2018-08-11 DIAGNOSIS — E1165 Type 2 diabetes mellitus with hyperglycemia: Secondary | ICD-10-CM | POA: Diagnosis not present

## 2018-08-11 DIAGNOSIS — E785 Hyperlipidemia, unspecified: Secondary | ICD-10-CM

## 2018-08-11 DIAGNOSIS — Z720 Tobacco use: Secondary | ICD-10-CM | POA: Diagnosis not present

## 2018-08-11 DIAGNOSIS — E1129 Type 2 diabetes mellitus with other diabetic kidney complication: Secondary | ICD-10-CM | POA: Diagnosis present

## 2018-08-11 DIAGNOSIS — I509 Heart failure, unspecified: Secondary | ICD-10-CM | POA: Diagnosis not present

## 2018-08-11 DIAGNOSIS — J9601 Acute respiratory failure with hypoxia: Secondary | ICD-10-CM

## 2018-08-11 DIAGNOSIS — N179 Acute kidney failure, unspecified: Secondary | ICD-10-CM | POA: Diagnosis not present

## 2018-08-11 DIAGNOSIS — F200 Paranoid schizophrenia: Secondary | ICD-10-CM | POA: Diagnosis not present

## 2018-08-11 DIAGNOSIS — J9602 Acute respiratory failure with hypercapnia: Secondary | ICD-10-CM | POA: Diagnosis present

## 2018-08-11 DIAGNOSIS — N183 Chronic kidney disease, stage 3 (moderate): Secondary | ICD-10-CM | POA: Diagnosis not present

## 2018-08-11 DIAGNOSIS — F209 Schizophrenia, unspecified: Secondary | ICD-10-CM | POA: Diagnosis present

## 2018-08-11 DIAGNOSIS — I10 Essential (primary) hypertension: Secondary | ICD-10-CM | POA: Diagnosis not present

## 2018-08-11 DIAGNOSIS — E1151 Type 2 diabetes mellitus with diabetic peripheral angiopathy without gangrene: Secondary | ICD-10-CM | POA: Diagnosis not present

## 2018-08-11 LAB — POCT I-STAT 7, (LYTES, BLD GAS, ICA,H+H)
Acid-base deficit: 3 mmol/L — ABNORMAL HIGH (ref 0.0–2.0)
Bicarbonate: 26.3 mmol/L (ref 20.0–28.0)
Calcium, Ion: 1.28 mmol/L (ref 1.15–1.40)
HCT: 39 % (ref 39.0–52.0)
Hemoglobin: 13.3 g/dL (ref 13.0–17.0)
O2 Saturation: 97 %
Patient temperature: 98.4
Potassium: 4 mmol/L (ref 3.5–5.1)
Sodium: 141 mmol/L (ref 135–145)
TCO2: 28 mmol/L (ref 22–32)
pCO2 arterial: 67.2 mmHg (ref 32.0–48.0)
pH, Arterial: 7.201 — ABNORMAL LOW (ref 7.350–7.450)
pO2, Arterial: 107 mmHg (ref 83.0–108.0)

## 2018-08-11 LAB — COMPREHENSIVE METABOLIC PANEL
ALT: 21 U/L (ref 0–44)
AST: 26 U/L (ref 15–41)
Albumin: 3.5 g/dL (ref 3.5–5.0)
Alkaline Phosphatase: 35 U/L — ABNORMAL LOW (ref 38–126)
Anion gap: 12 (ref 5–15)
BUN: 18 mg/dL (ref 6–20)
CO2: 21 mmol/L — ABNORMAL LOW (ref 22–32)
Calcium: 9.2 mg/dL (ref 8.9–10.3)
Chloride: 105 mmol/L (ref 98–111)
Creatinine, Ser: 2.11 mg/dL — ABNORMAL HIGH (ref 0.61–1.24)
GFR calc Af Amer: 41 mL/min — ABNORMAL LOW (ref >60)
GFR calc non Af Amer: 35 mL/min — ABNORMAL LOW (ref >60)
Glucose, Bld: 98 mg/dL (ref 70–99)
Potassium: 4 mmol/L (ref 3.5–5.1)
Sodium: 138 mmol/L (ref 135–145)
Total Bilirubin: 0.6 mg/dL (ref 0.3–1.2)
Total Protein: 6.7 g/dL (ref 6.5–8.1)

## 2018-08-11 LAB — TROPONIN I: Troponin I: <0.03 ng/mL (ref <0.03)

## 2018-08-11 LAB — CBC WITH DIFFERENTIAL/PLATELET
Abs Immature Granulocytes: 0.03 10*3/uL (ref 0.00–0.07)
Basophils Absolute: 0 10*3/uL (ref 0.0–0.1)
Basophils Relative: 0 %
Eosinophils Absolute: 0.2 10*3/uL (ref 0.0–0.5)
Eosinophils Relative: 2 %
HCT: 41.4 % (ref 39.0–52.0)
Hemoglobin: 13.1 g/dL (ref 13.0–17.0)
Immature Granulocytes: 0 %
Lymphocytes Relative: 12 %
Lymphs Abs: 1.2 10*3/uL (ref 0.7–4.0)
MCH: 28.7 pg (ref 26.0–34.0)
MCHC: 31.6 g/dL (ref 30.0–36.0)
MCV: 90.8 fL (ref 80.0–100.0)
Monocytes Absolute: 1.1 10*3/uL — ABNORMAL HIGH (ref 0.1–1.0)
Monocytes Relative: 11 %
Neutro Abs: 7.5 10*3/uL (ref 1.7–7.7)
Neutrophils Relative %: 75 %
Platelets: 477 10*3/uL — ABNORMAL HIGH (ref 150–400)
RBC: 4.56 MIL/uL (ref 4.22–5.81)
RDW: 14.3 % (ref 11.5–15.5)
WBC: 10.1 10*3/uL (ref 4.0–10.5)
nRBC: 0 % (ref 0.0–0.2)

## 2018-08-11 LAB — CK: Total CK: 328 U/L (ref 49–397)

## 2018-08-11 LAB — CBC
HCT: 38.6 % — ABNORMAL LOW (ref 39.0–52.0)
Hemoglobin: 12.5 g/dL — ABNORMAL LOW (ref 13.0–17.0)
MCH: 28.9 pg (ref 26.0–34.0)
MCHC: 32.4 g/dL (ref 30.0–36.0)
MCV: 89.1 fL (ref 80.0–100.0)
Platelets: 475 K/uL — ABNORMAL HIGH (ref 150–400)
RBC: 4.33 MIL/uL (ref 4.22–5.81)
RDW: 14.2 % (ref 11.5–15.5)
WBC: 10.3 K/uL (ref 4.0–10.5)
nRBC: 0 % (ref 0.0–0.2)

## 2018-08-11 LAB — GLUCOSE, CAPILLARY
Glucose-Capillary: 86 mg/dL (ref 70–99)
Glucose-Capillary: 106 mg/dL — ABNORMAL HIGH (ref 70–99)
Glucose-Capillary: 108 mg/dL — ABNORMAL HIGH (ref 70–99)

## 2018-08-11 LAB — BASIC METABOLIC PANEL WITH GFR
Anion gap: 9 (ref 5–15)
BUN: 14 mg/dL (ref 6–20)
CO2: 23 mmol/L (ref 22–32)
Calcium: 8.9 mg/dL (ref 8.9–10.3)
Chloride: 105 mmol/L (ref 98–111)
Creatinine, Ser: 1.98 mg/dL — ABNORMAL HIGH (ref 0.61–1.24)
GFR calc Af Amer: 44 mL/min — ABNORMAL LOW
GFR calc non Af Amer: 38 mL/min — ABNORMAL LOW
Glucose, Bld: 98 mg/dL (ref 70–99)
Potassium: 3.9 mmol/L (ref 3.5–5.1)
Sodium: 137 mmol/L (ref 135–145)

## 2018-08-11 LAB — ECHOCARDIOGRAM COMPLETE

## 2018-08-11 LAB — AMMONIA: Ammonia: 35 umol/L (ref 9–35)

## 2018-08-11 MED ORDER — HALOPERIDOL LACTATE 5 MG/ML IJ SOLN
5.0000 mg | Freq: Once | INTRAMUSCULAR | Status: AC
  Administered 2018-08-11: 5 mg via INTRAVENOUS
  Filled 2018-08-11: qty 1

## 2018-08-11 MED ORDER — RISPERIDONE 2 MG PO TABS
2.0000 mg | ORAL_TABLET | Freq: Every day | ORAL | Status: DC
  Filled 2018-08-11: qty 1

## 2018-08-11 MED ORDER — DOCUSATE SODIUM 100 MG PO CAPS
100.0000 mg | ORAL_CAPSULE | Freq: Two times a day (BID) | ORAL | Status: DC
  Filled 2018-08-11 (×2): qty 1

## 2018-08-11 MED ORDER — GLYBURIDE 5 MG PO TABS: 5.0000 mg | ORAL_TABLET | Freq: Every day | ORAL | 0 refills | Status: DC

## 2018-08-11 MED ORDER — HALOPERIDOL LACTATE 5 MG/ML IJ SOLN: 5.0000 mg | Freq: Four times a day (QID) | INTRAMUSCULAR | Status: DC | PRN

## 2018-08-11 MED ORDER — ACETAMINOPHEN 325 MG PO TABS: 650.0000 mg | ORAL_TABLET | Freq: Four times a day (QID) | ORAL | Status: DC | PRN

## 2018-08-11 MED ORDER — LORAZEPAM 2 MG/ML IJ SOLN
2.0000 mg | Freq: Once | INTRAMUSCULAR | Status: AC
  Administered 2018-08-11: 2 mg via INTRAVENOUS
  Filled 2018-08-11: qty 1

## 2018-08-11 MED ORDER — FENOFIBRATE 160 MG PO TABS
160.0000 mg | ORAL_TABLET | Freq: Every day | ORAL | Status: DC
  Filled 2018-08-11: qty 1

## 2018-08-11 MED ORDER — MIRTAZAPINE 30 MG PO TABS
30.0000 mg | ORAL_TABLET | Freq: Every day | ORAL | Status: DC
  Filled 2018-08-11: qty 1

## 2018-08-11 MED ORDER — HALOPERIDOL 5 MG PO TABS: 5.0000 mg | ORAL_TABLET | Freq: Four times a day (QID) | ORAL | Status: DC | PRN

## 2018-08-11 MED ORDER — MINOCYCLINE HCL 100 MG PO CAPS
100.0000 mg | ORAL_CAPSULE | Freq: Two times a day (BID) | ORAL | Status: DC
  Administered 2018-08-11: 100 mg via ORAL
  Filled 2018-08-11 (×3): qty 1

## 2018-08-11 MED ORDER — DIPHENHYDRAMINE HCL 50 MG/ML IJ SOLN: 50.0000 mg | Freq: Four times a day (QID) | INTRAMUSCULAR | Status: DC | PRN

## 2018-08-11 MED ORDER — METOPROLOL TARTRATE 25 MG PO TABS
25.0000 mg | ORAL_TABLET | Freq: Two times a day (BID) | ORAL | Status: DC
  Filled 2018-08-11 (×2): qty 1

## 2018-08-11 MED ORDER — NICOTINE 14 MG/24HR TD PT24
14.0000 mg | MEDICATED_PATCH | Freq: Every day | TRANSDERMAL | Status: DC
  Filled 2018-08-11: qty 1

## 2018-08-11 MED ORDER — HALOPERIDOL LACTATE 5 MG/ML IJ SOLN
INTRAMUSCULAR | Status: AC
  Administered 2018-08-11: 17:00:00
  Filled 2018-08-11: qty 1

## 2018-08-11 MED ORDER — MINOCYCLINE HCL 100 MG PO CAPS
100.0000 mg | ORAL_CAPSULE | Freq: Two times a day (BID) | ORAL | Status: DC
  Filled 2018-08-11: qty 1

## 2018-08-11 MED ORDER — INSULIN ASPART 100 UNIT/ML ~~LOC~~ SOLN: 0.0000 [IU] | Freq: Every day | SUBCUTANEOUS | Status: DC

## 2018-08-11 MED ORDER — RISPERIDONE 2 MG PO TABS
4.0000 mg | ORAL_TABLET | Freq: Every day | ORAL | Status: DC
  Filled 2018-08-11: qty 2

## 2018-08-11 MED ORDER — INSULIN GLARGINE 100 UNIT/ML ~~LOC~~ SOLN
60.0000 [IU] | Freq: Every day | SUBCUTANEOUS | Status: DC
  Filled 2018-08-11: qty 0.6

## 2018-08-11 MED ORDER — INSULIN ASPART 100 UNIT/ML ~~LOC~~ SOLN: 0.0000 [IU] | Freq: Three times a day (TID) | SUBCUTANEOUS | Status: DC

## 2018-08-11 MED ORDER — ZIPRASIDONE MESYLATE 20 MG IM SOLR: 20.0000 mg | Freq: Once | INTRAMUSCULAR | Status: DC

## 2018-08-11 MED ORDER — ASPIRIN 81 MG PO CHEW
81.0000 mg | CHEWABLE_TABLET | Freq: Every day | ORAL | Status: DC
  Filled 2018-08-11: qty 1

## 2018-08-11 MED ORDER — DIPHENHYDRAMINE HCL 25 MG PO CAPS: 50.0000 mg | ORAL_CAPSULE | Freq: Four times a day (QID) | ORAL | Status: DC | PRN

## 2018-08-11 MED ORDER — MINOCYCLINE HCL 100 MG PO CAPS: 100.0000 mg | ORAL_CAPSULE | Freq: Two times a day (BID) | ORAL | 0 refills | Status: DC

## 2018-08-11 MED ORDER — SIMVASTATIN 20 MG PO TABS
40.0000 mg | ORAL_TABLET | Freq: Every day | ORAL | Status: DC
  Filled 2018-08-11: qty 1

## 2018-08-11 MED ORDER — LORAZEPAM 1 MG PO TABS: 2.0000 mg | ORAL_TABLET | Freq: Four times a day (QID) | ORAL | Status: DC | PRN

## 2018-08-11 MED ORDER — ALUM & MAG HYDROXIDE-SIMETH 200-200-20 MG/5ML PO SUSP: 30.0000 mL | ORAL | Status: DC | PRN

## 2018-08-11 MED ORDER — TRAZODONE HCL 100 MG PO TABS: 100.0000 mg | ORAL_TABLET | Freq: Every evening | ORAL | Status: DC | PRN

## 2018-08-11 MED ORDER — HALOPERIDOL LACTATE 5 MG/ML IJ SOLN
5.0000 mg | Freq: Once | INTRAMUSCULAR | Status: AC
  Administered 2018-08-11: 5 mg via INTRAVENOUS

## 2018-08-11 MED ORDER — LORAZEPAM 2 MG/ML IJ SOLN: 2.0000 mg | Freq: Four times a day (QID) | INTRAMUSCULAR | Status: DC | PRN

## 2018-08-11 MED ORDER — MAGNESIUM HYDROXIDE 400 MG/5ML PO SUSP: 30.0000 mL | Freq: Every day | ORAL | Status: DC | PRN

## 2018-08-11 NOTE — Progress Notes (Signed)
PROGRESS NOTE    [name] [name].  KNL:[id number] DOB: 05/02/1965 DOA: 08/10/2018 PCP: Ann Held, DO   Brief Narrative:  [name] Rezabek. is a 53 y.o. male with medical history significant of DM; HTN; HLD; and schizophrenia presenting with  Psychosis. Caretaker reported that he had not slept since "Sunday.  Patient was found to have a gun that he was pointing around in the lobby and also a gun, and [employer] took him into custody.  The patient was unable to provide history at the time of admission.  He was given sedatives prior to my evaluation and was obtunded.   ED Course:   Psych eval - increasingly agitated per caregiver.  IVC done after gun and knife in waiting room.  AKI - given bolus.  Will recheck BMP and if normalizing can go to inpatient BH today.  If not normalizing, can observe overnight for IV hydration.   Assessment & Plan:   Principal Problem:   Acute renal failure superimposed on stage 3 chronic kidney disease (HCC) Active Problems:   HTN (hypertension)   Hyperlipidemia   DM (diabetes mellitus) type II uncontrolled, periph vascular disorder (HCC)   Tobacco abuse   Schizophrenia, paranoid type (HCC)  Dehydration with prerenal azotemia.  Patient's baseline creatinine was initially reported about 1.65 review of records indicate is roughly 2.0 which is where patient is currently.  He did receive some fluids in the ER and is stable from a renal standpoint.  BNP was normal.  Hypoxia patient was given sedatives and then had hypoxia while sleeping.  Patient is awake and alert, no evidence of hypoxia.  Likely has obstructive sleep apnea with obesity hypoventilation syndrome and will benefit from outpatient sleep study.  BNP was normal.  Chest x-ray with mild volume consistent with IV fluids received in the ER no indication for acute intervention patient's been weaned off oxygen is satting 97% on room air.  Schizophrenia: Patient with history of like psychosis.   He was seen by behavioral assessment the ER with recommendations for inpatient behavioral health treatment secondary to concerns for but being dangerous to self or others. Patient is medically cleared Psychiatric medications per psychiatry  Hypertension: .  Continue patient's home medications no change  Hyperlipidemia     again continue patient's Zocor without change.  Mild cellulitis left lower extremity. Added minocycline.  Patient continue 7 days of p.o. treatment.  White blood cell count is normal hemodynamically     DVT prophylaxis: Lovenox SQ  Code Status: Full code    Code Status Orders  (From admission, onward)         Start     Ordered   08/10/18 1521  Full code  Continuous     05" /28/20 1531        Code Status History    Date Active Date Inactive Code Status Order ID Comments User Context   08/10/2018 0732 08/10/2018 1531 Full Code [id number]  [name]   11/18/2017 1701 12/03/2017 1145 Full Code [id number]  Ethelene Hal, NP Inpatient   11/17/2017 1539 11/18/2017 1646 Full Code [id number]  Rodney Booze, PA-C ED   11/15/2017 1800 11/16/2017 1915 Full Code [id number]  Blanchie Dessert, MD ED   11/05/2017 2022 11/08/2017 1854 Full Code [id number]  Ivor Costa, MD ED   06/09/2016 1937 06/11/2016 1338 Full Code [id number]  Fransico Meadow, PA-C ED   06/09/2016 1534 06/09/2016 1937 Full Code [id number]  Fransico Meadow, PA-C ED  04/05/2016 0121 04/05/2016 1827 Full Code [id number]  Nona Dell, PA-C ED     Family Communication: None today,  attempt to reach legal guardian Disposition Plan:   Patient was admitted under observation, stable for transfer to behavioral health for an patient treatment Consults called: Psychiatry currently involved Admission status: Observation   Consultants:   As above  Procedures:  Dg Chest 1 View  Result Date: 08/10/2018 CLINICAL DATA:  Hypoxia EXAM: CHEST  1 VIEW COMPARISON:  01/13/2018 FINDINGS: The cardiac  silhouette is mildly enlarged. There are prominent interstitial lung markings bilaterally. There are old healed right-sided rib fractures. There is no pneumothorax. No large pleural effusion. IMPRESSION: Borderline enlarged heart with mild prominence of the interstitial lung markings suggestive of volume overload. Electronically Signed   By: Constance Holster M.D.   On: 08/10/2018 20:28   Ct Head Wo Contrast  Result Date: 08/10/2018 CLINICAL DATA:  Altered mental status. Insomnia. History of schizophrenia EXAM: CT HEAD WITHOUT CONTRAST TECHNIQUE: Contiguous axial images were obtained from the base of the skull through the vertex without intravenous contrast. COMPARISON:  None. FINDINGS: Brain: There is mild diffuse atrophy. There is no intracranial mass, hemorrhage, extra-axial fluid collection, or midline shift. Brain parenchyma appears unremarkable. No evident acute infarct. Vascular: There is no appreciable hyperdense vessel. There is no appreciable vascular calcification. Skull: The bony calvarium appears intact. Sinuses/Orbits: There is mucosal thickening in several ethmoid air cells. Other visualized paranasal sinuses are clear. Frontal sinuses are hypoplastic. Orbits appear symmetric bilaterally. Other: Mastoid air cells are clear. There is debris in each external auditory canal. IMPRESSION: Mild diffuse atrophy. The brain parenchyma appears unremarkable. No acute infarct. No mass or hemorrhage. Mucosal thickening noted in several ethmoid air cells. There is probable cerumen in the external auditory canals. Electronically Signed   By: Lowella Grip III M.D.   On: 08/10/2018 08:29     Antimicrobials:   Minocycline day 1 of 7   Subjective: Patient still remains incoherent with disorganized thought process,  Objective: Vitals:   08/10/18 1445 08/10/18 1500 08/10/18 1659 08/10/18 2120  BP: 114/75 113/72 (!) 135/97 126/83  Pulse: 96 96 (!) 101 (!) 104  Resp:   20 20  Temp:   98.8 F  (37.1 C) 98.4 F (36.9 C)  TempSrc:   Oral Oral  SpO2: 97% 96% 96% 97%    Intake/Output Summary (Last 24 hours) at 08/11/2018 1246 Last data filed at 08/11/2018 0849 Gross per 24 hour  Intake 480 ml  Output 2300 ml  Net -1820 ml   There were no vitals filed for this visit.  Examination:  General exam: Patient is talking incoherently Respiratory system: Clear to auscultation. Respiratory effort normal. Cardiovascular system: S1 & S2 heard, RRR. No JVD, murmurs, rubs, gallops or clicks. No pedal edema. Gastrointestinal system: Abdomen is nondistended, soft and nontender. No organomegaly or masses felt. Normal bowel sounds heard. Central nervous system: Patient is awake, no focal neurological deficit although very limited exam secondary patient psychosis. Extremities: Warm well perfused, left lower extremity mild erythema, no contractures Skin: Left lower extremity with mild erythema no weeping no purulence no lesions Psychiatry: Patient judgment and insight is poor, disorganized thought process, talking incoherently..     Data Reviewed: I have personally reviewed following labs and imaging studies  CBC: Recent Labs  Lab 08/10/18 0744 08/11/18 0233  WBC 14.5* 10.3  NEUTROABS 11.8*  --   HGB 13.3 12.5*  HCT 41.2 38.6*  MCV 88.2 89.1  PLT  503* 696*   Basic Metabolic Panel: Recent Labs  Lab 08/10/18 0744 08/10/18 1235 08/10/18 1335 08/11/18 0233  NA 133*  --  136 137  K 3.8  --  3.7 3.9  CL 97*  --  103 105  CO2 23  --  22 23  GLUCOSE 205*  --  119* 98  BUN 16  --  12 14  CREATININE 2.36* 2.20* 2.11* 1.98*  CALCIUM 9.4  --  9.1 8.9   GFR: CrCl cannot be calculated (Unknown ideal weight.). Liver Function Tests: No results for input(s): AST, ALT, ALKPHOS, BILITOT, PROT, ALBUMIN in the last 168 hours. No results for input(s): LIPASE, AMYLASE in the last 168 hours. No results for input(s): AMMONIA in the last 168 hours. Coagulation Profile: No results for  input(s): INR, PROTIME in the last 168 hours. Cardiac Enzymes: No results for input(s): CKTOTAL, CKMB, CKMBINDEX, TROPONINI in the last 168 hours. BNP (last 3 results) No results for input(s): PROBNP in the last 8760 hours. HbA1C: No results for input(s): HGBA1C in the last 72 hours. CBG: Recent Labs  Lab 08/10/18 1741 08/10/18 2118 08/11/18 0815 08/11/18 1138  GLUCAP 114* 119* 86 106*   Lipid Profile: No results for input(s): CHOL, HDL, LDLCALC, TRIG, CHOLHDL, LDLDIRECT in the last 72 hours. Thyroid Function Tests: No results for input(s): TSH, T4TOTAL, FREET4, T3FREE, THYROIDAB in the last 72 hours. Anemia Panel: No results for input(s): VITAMINB12, FOLATE, FERRITIN, TIBC, IRON, RETICCTPCT in the last 72 hours. Sepsis Labs: No results for input(s): PROCALCITON, LATICACIDVEN in the last 168 hours.  Recent Results (from the past 240 hour(s))  SARS Coronavirus 2 (CEPHEID - Performed in [facility] hospital lab), Hosp Order     Status: None   Collection Time: 08/10/18  8:51 AM  Result Value Ref Range Status   SARS Coronavirus 2 NEGATIVE NEGATIVE Final    Comment: (NOTE) If result is NEGATIVE SARS-CoV-2 target nucleic acids are NOT DETECTED. The SARS-CoV-2 RNA is generally detectable in upper and lower  respiratory specimens during the acute phase of infection. The lowest  concentration of SARS-CoV-2 viral copies this assay can detect is 250  copies / mL. A negative result does not preclude SARS-CoV-2 infection  and should not be used as the sole basis for treatment or other  patient management decisions.  A negative result may occur with  improper specimen collection / handling, submission of specimen other  than nasopharyngeal swab, presence of viral mutation(s) within the  areas targeted by this assay, and inadequate number of viral copies  (<250 copies / mL). A negative result must be combined with clinical  observations, patient history, and epidemiological information.  If result is POSITIVE SARS-CoV-2 target nucleic acids are DETECTED. The SARS-CoV-2 RNA is generally detectable in upper and lower  respiratory specimens dur ing the acute phase of infection.  Positive  results are indicative of active infection with SARS-CoV-2.  Clinical  correlation with patient history and other diagnostic information is  necessary to determine patient infection status.  Positive results do  not rule out bacterial infection or co-infection with other viruses. If result is PRESUMPTIVE POSTIVE SARS-CoV-2 nucleic acids MAY BE PRESENT.   A presumptive positive result was obtained on the submitted specimen  and confirmed on repeat testing.  While 2019 novel coronavirus  (SARS-CoV-2) nucleic acids may be present in the submitted sample  additional confirmatory testing may be necessary for epidemiological  and / or clinical management purposes  to differentiate between  SARS-CoV-2  and other Sarbecovirus currently known to infect humans.  If clinically indicated additional testing with an alternate test  methodology [phone]) is advised. The SARS-CoV-2 RNA is generally  detectable in upper and lower respiratory sp ecimens during the acute  phase of infection. The expected result is Negative. Fact Sheet for Patients:  StrictlyIdeas.no Fact Sheet for Healthcare Providers: BankingDealers.co.za This test is not yet approved or cleared by the [place] FDA and has been authorized for detection and/or diagnosis of SARS-CoV-2 by FDA under an Emergency Use Authorization (EUA).  This EUA will remain in effect (meaning this test can be used) for the duration of the COVID-19 declaration under Section 564(b)(1) of the Act, 21 U.S.C. section 360bbb-3(b)(1), unless the authorization is terminated or revoked sooner. Performed at [facility], [place] [address] [place], [place] [address]          Radiology Studies: Dg Chest  1 View  Result Date: 08/10/2018 CLINICAL DATA:  Hypoxia EXAM: CHEST  1 VIEW COMPARISON:  01/13/2018 FINDINGS: The cardiac silhouette is mildly enlarged. There are prominent interstitial lung markings bilaterally. There are old healed right-sided rib fractures. There is no pneumothorax. No large pleural effusion. IMPRESSION: Borderline enlarged heart with mild prominence of the interstitial lung markings suggestive of volume overload. Electronically Signed   By: Constance Holster M.D.   On: 08/10/2018 20:28   Ct Head Wo Contrast  Result Date: 08/10/2018 CLINICAL DATA:  Altered mental status. Insomnia. History of schizophrenia EXAM: CT HEAD WITHOUT CONTRAST TECHNIQUE: Contiguous axial images were obtained from the base of the skull through the vertex without intravenous contrast. COMPARISON:  None. FINDINGS: Brain: There is mild diffuse atrophy. There is no intracranial mass, hemorrhage, extra-axial fluid collection, or midline shift. Brain parenchyma appears unremarkable. No evident acute infarct. Vascular: There is no appreciable hyperdense vessel. There is no appreciable vascular calcification. Skull: The bony calvarium appears intact. Sinuses/Orbits: There is mucosal thickening in several ethmoid air cells. Other visualized paranasal sinuses are clear. Frontal sinuses are hypoplastic. Orbits appear symmetric bilaterally. Other: Mastoid air cells are clear. There is debris in each external auditory canal. IMPRESSION: Mild diffuse atrophy. The brain parenchyma appears unremarkable. No acute infarct. No mass or hemorrhage. Mucosal thickening noted in several ethmoid air cells. There is probable cerumen in the external auditory canals. Electronically Signed   By: Lowella Grip III M.D.   On: 08/10/2018 08:29        Scheduled Meds: . aspirin EC  81 mg Oral Daily  . docusate sodium  100 mg Oral BID  . enoxaparin (LOVENOX) injection  40 mg Subcutaneous Q24H  . erythromycin  1 application Right Eye  X8P  . fenofibrate  160 mg Oral Daily  . insulin aspart  0-15 Units Subcutaneous TID WC  . insulin aspart  0-5 Units Subcutaneous QHS  . insulin glargine  60 Units Subcutaneous QHS  . metoprolol tartrate  25 mg Oral BID  . minocycline  100 mg Oral BID  . mirtazapine  30 mg Oral QHS  . risperiDONE  2 mg Oral q morning - 10a  . risperiDONE  4 mg Oral QHS  . simvastatin  40 mg Oral q1800   Continuous Infusions:   LOS: 0 days    Time spent: 35 min     Nicolette Bang, MD [facility]  If 7PM-7AM, please contact night-coverage  08/11/2018, 12:46 PM

## 2018-08-11 NOTE — ED Triage Notes (Signed)
Pt presents to ED c/o AMS per psychiatrist at [facility]. Pt recently d/c from [facility] and medically cleared. Per EMS psychiatrist at [facility] says he is altered and not medically stable to be at their facility.

## 2018-08-11 NOTE — Significant Event (Addendum)
Rapid Response Event Note  Called by staff for assistance with change in patient's behavoir, per staff VSS, I advised the nurses to page the MD on call and seek assistance from MD. Nurses paged the MD. After several minutes, nurses called informing that patient was ordered Geodon 20 mg IM. Per pharmacy, this medication cannot be given unless patient is in the ED or given by psychiatry. Orders for Haldol 5 mg IV x 1 were ordered and administered.   Call Time Clinch End Time 1630  Jamelle Goldston R

## 2018-08-11 NOTE — ED Notes (Signed)
Staffing called for sitter. None available at this time.

## 2018-08-11 NOTE — Progress Notes (Signed)
Pt discharged to [facility]. Pt a/o X 2. IVC paper work and other belongings sent with patient. MD and social work aware of discharge. RN to continue to monitor.

## 2018-08-11 NOTE — Progress Notes (Addendum)
CSW received consult for inpatient psych placement. CSW sent referral to [facility] for review. They requested updated vitals, RN aware.   Update: [facility] able to accept patient today. MD aware and will complete discharge.  Percell Locus Shaquavia Whisonant LCSW [phone]

## 2018-08-11 NOTE — ED Notes (Signed)
EDP starting IV

## 2018-08-11 NOTE — BH Assessment (Addendum)
Patient arrived to [facility] under IVC escorted by [employer]. Patient known to [facility] staff,  with altered mental status and febrile. Patient assessed my Dr Parke Poisson, report called to [facility] ED charge RN. [place] will provide observation for this patient during evaluation.

## 2018-08-11 NOTE — Progress Notes (Signed)
  Echocardiogram 2D Echocardiogram has been performed.  [name] [name] 08/11/2018, 1:17 PM

## 2018-08-11 NOTE — Progress Notes (Addendum)
Patient will DC to: [facility] Anticipated DC date: 08/11/18 Family notified: Legal Guardian, [name] Transport by: Brandon Melnick when available   Per MD patient ready for DC to [facility]. RN, patient, patient's family, and facility notified of DC. Discharge Summary and IVC sent to facility. RN to call report PRIOR to discharge [phone]). IVC on chart to go with patient. Transport called.  Bed 506 bed 2  CSW will sign off for now as social work intervention is no longer needed. Please consult [place] again if new needs arise.  Cedric Fishman, LCSW Clinical Social Worker [phone]

## 2018-08-11 NOTE — Discharge Summary (Addendum)
Physician Discharge Summary  [name] [name]. PQD:[id number] DOB: 09-25-1965 DOA: 08/10/2018  PCP: Ann Held, DO  Admit date: 08/10/2018 Discharge date: 08/11/2018  Admitted From: Observation Disposition: Inpatient psychiatry  Recommendations for Outpatient Follow-up:  1. Follow up to be determined by receiving facility:  Home Health:No Equipment/Devices: None Discharge Condition:Stable CODE STATUS:Full code Diet recommendation: Diabetic diet   ADDENDUM: 16:27 DISCUSSED WITH PSYCH ON CALL A. NORMAN RE PT PSYCHOSIS ON THE FLOOR, DISCUSSED ADMINISTRATYION OF [facility] DUE TO BEHAVIOR/PSYCHOSIS ON THE FLOOR, THEY HAD NO ADDITIONAL RECOMMENDATIONS AND INDICTAED "THEY CAN GIVE THE PT MORE IF HE NEEDS IT WHEN HE IS ON THE FLOOR HERE" NO ADDITIONAL RECOMMENDATIONS FROM [facility]   Brief/Interim Summary: [name] Vaughnis a 53 y.o.malewith medical history significant ofDM; HTN; HLD; and schizophrenia presenting with Psychosis. Caretaker reported that he had not slept since Sunday. Patient was found to have a gun that he was pointing around in the lobby and also a gun, and [employer] took him into custody. The patient was unable to provide history at the time of admission. He was given sedatives prior to my evaluation and was obtunded.  Hospital course: Dehydration with prerenal azotemia.  Patient's baseline creatinine was initially reported about 1.65 review of records indicate is roughly 2.0 which is where patient is currently.  He did receive some fluids in the ER and is stable from a renal standpoint.  BNP was normal.  Hypoxia patient was given sedatives and then had hypoxia while sleeping.  Patient is awake and alert, no evidence of hypoxia.  Likely has obstructive sleep apnea with obesity hypoventilation syndrome and will benefit from outpatient sleep study.  BNP was normal.  Chest x-ray with mild volume consistent with IV fluids received in the ER no indication for acute  intervention patient's been weaned off oxygen is satting 97% on room air.  Schizophrenia: Patient with history of like psychosis.  He was seen by behavioral assessment the ER with recommendations for inpatient behavioral health treatment secondary to concerns for but being dangerous to self or others. Patient is medically cleared Psychiatric medications per psychiatry  Hypertension: .  Continue patient's home medications no change  Hyperlipidemia     again continue patient's Zocor without change.  Mild cellulitis left lower extremity. Added minocycline.  Patient continue 7 days of p.o. treatment.  White blood cell count is normal hemodynamically   Diabetes.  Discontinue patient's metformin given his baseline creatinine of 2.  Added glyburide, diabetic diet, monitor blood sugars  Discharge Diagnoses:  Principal Problem:   Acute renal failure superimposed on stage 3 chronic kidney disease (HCC) Active Problems:   HTN (hypertension)   Hyperlipidemia   DM (diabetes mellitus) type II uncontrolled, periph vascular disorder (HCC)   Tobacco abuse   Schizophrenia, paranoid type [facility])    Discharge Instructions  Discharge Instructions    Diet Carb Modified   Complete by:  As directed    Increase activity slowly   Complete by:  As directed      Allergies as of 08/11/2018   No Known Allergies     Medication List    STOP taking these medications   metFORMIN 500 MG tablet Commonly known as:  GLUCOPHAGE     TAKE these medications   Accu-Chek Aviva Plus test strip Generic drug:  glucose blood TEST 4 X DAILY   Accu-Chek FastClix Lancets Misc CHECK BLOOD SUGAR 4X DAILY   aspirin EC 81 MG tablet Take 1 tablet (81 mg total) by mouth  daily. For heart health   blood glucose meter kit and supplies Kit Dispense based on patient and insurance preference. Use up to four times daily as directed. (FOR ICD-9 250.00, 250.01).   fenofibrate 160 MG tablet TAKE 1 TABLET BY MOUTH  EVERY DAY   glyBURIDE 5 MG tablet Commonly known as:  DIABETA Take 1 tablet (5 mg total) by mouth daily with breakfast for 30 days.   Lantus SoloStar 100 UNIT/ML Solostar Pen Generic drug:  Insulin Glargine Inject 60 Units into the skin at bedtime.   metoprolol tartrate 25 MG tablet Commonly known as:  LOPRESSOR TAKE 1 TABLET BY MOUTH TWICE A DAY What changed:  when to take this   minocycline 100 MG capsule Commonly known as:  MINOCIN Take 1 capsule (100 mg total) by mouth 2 (two) times daily for 7 days.   mirtazapine 30 MG tablet Commonly known as:  Remeron Take 1 tablet (30 mg total) by mouth at bedtime.   multivitamin-iron-minerals-folic acid chewable tablet Chew 1 tablet by mouth daily.   OVER THE COUNTER MEDICATION CBD OIL  VAPE OIL   Pen Needles 32G X 6 MM Misc Inject 1 each into the skin daily.   risperiDONE 2 MG tablet Commonly known as:  RisperDAL Take 1 tablet (2 mg) qam, take 2 tablets (4 mg) qhs What changed:    how much to take  how to take this  when to take this  additional instructions   simvastatin 40 MG tablet Commonly known as:  ZOCOR TAKE 1 TABLET BY MOUTH EVERY DAY What changed:  when to take this   traZODone 100 MG tablet Commonly known as:  DESYREL Take 1 tablet (100 mg total) by mouth at bedtime as needed for sleep.   Vitamin D (Ergocalciferol) 1.25 MG (50000 UT) Caps capsule Commonly known as:  DRISDOL Take 1 capsule (50,000 Units total) by mouth once a week. For bone health What changed:  additional instructions       No Known Allergies  Consultations:  psychiatry   Procedures/Studies: Dg Chest 1 View  Result Date: 08/10/2018 CLINICAL DATA:  Hypoxia EXAM: CHEST  1 VIEW COMPARISON:  01/13/2018 FINDINGS: The cardiac silhouette is mildly enlarged. There are prominent interstitial lung markings bilaterally. There are old healed right-sided rib fractures. There is no pneumothorax. No large pleural effusion. IMPRESSION:  Borderline enlarged heart with mild prominence of the interstitial lung markings suggestive of volume overload. Electronically Signed   By: Constance Holster M.D.   On: 08/10/2018 20:28   Ct Head Wo Contrast  Result Date: 08/10/2018 CLINICAL DATA:  Altered mental status. Insomnia. History of schizophrenia EXAM: CT HEAD WITHOUT CONTRAST TECHNIQUE: Contiguous axial images were obtained from the base of the skull through the vertex without intravenous contrast. COMPARISON:  None. FINDINGS: Brain: There is mild diffuse atrophy. There is no intracranial mass, hemorrhage, extra-axial fluid collection, or midline shift. Brain parenchyma appears unremarkable. No evident acute infarct. Vascular: There is no appreciable hyperdense vessel. There is no appreciable vascular calcification. Skull: The bony calvarium appears intact. Sinuses/Orbits: There is mucosal thickening in several ethmoid air cells. Other visualized paranasal sinuses are clear. Frontal sinuses are hypoplastic. Orbits appear symmetric bilaterally. Other: Mastoid air cells are clear. There is debris in each external auditory canal. IMPRESSION: Mild diffuse atrophy. The brain parenchyma appears unremarkable. No acute infarct. No mass or hemorrhage. Mucosal thickening noted in several ethmoid air cells. There is probable cerumen in the external auditory canals. Electronically Signed   By: Gwyndolyn Saxon  Jasmine December III M.D.   On: 08/10/2018 08:29       Subjective: Patient remains with a disorganized thought process Incoherent  Discharge Exam: Vitals:   08/10/18 2120 08/11/18 1332  BP: 126/83 126/79  Pulse: (!) 104 92  Resp: 20 20  Temp: 98.4 F (36.9 C) 98.6 F (37 C)  SpO2: 97% 96%   Vitals:   08/10/18 1500 08/10/18 1659 08/10/18 2120 08/11/18 1332  BP: 113/72 (!) 135/97 126/83 126/79  Pulse: 96 (!) 101 (!) 104 92  Resp:  20 20 20   Temp:  98.8 F (37.1 C) 98.4 F (36.9 C) 98.6 F (37 C)  TempSrc:  Oral Oral Oral  SpO2: 96% 96% 97% 96%     General: Pt is alert, awake, not in acute distress Cardiovascular: RRR, S1/S2 +, no rubs, no gallops Respiratory: CTA bilaterally, no wheezing, no rhonchi Abdominal: Soft, NT, ND, bowel sounds + Extremities: no edema, no cyanosis    The results of significant diagnostics from this hospitalization (including imaging, microbiology, ancillary and laboratory) are listed below for reference.     Microbiology: Recent Results (from the past 240 hour(s))  SARS Coronavirus 2 (CEPHEID - Performed in [place] hospital lab), Hosp Order     Status: None   Collection Time: 08/10/18  8:51 AM  Result Value Ref Range Status   SARS Coronavirus 2 NEGATIVE NEGATIVE Final    Comment: (NOTE) If result is NEGATIVE SARS-CoV-2 target nucleic acids are NOT DETECTED. The SARS-CoV-2 RNA is generally detectable in upper and lower  respiratory specimens during the acute phase of infection. The lowest  concentration of SARS-CoV-2 viral copies this assay can detect is 250  copies / mL. A negative result does not preclude SARS-CoV-2 infection  and should not be used as the sole basis for treatment or other  patient management decisions.  A negative result may occur with  improper specimen collection / handling, submission of specimen other  than nasopharyngeal swab, presence of viral mutation(s) within the  areas targeted by this assay, and inadequate number of viral copies  (<250 copies / mL). A negative result must be combined with clinical  observations, patient history, and epidemiological information. If result is POSITIVE SARS-CoV-2 target nucleic acids are DETECTED. The SARS-CoV-2 RNA is generally detectable in upper and lower  respiratory specimens dur ing the acute phase of infection.  Positive  results are indicative of active infection with SARS-CoV-2.  Clinical  correlation with patient history and other diagnostic information is  necessary to determine patient infection status.  Positive  results do  not rule out bacterial infection or co-infection with other viruses. If result is PRESUMPTIVE POSTIVE SARS-CoV-2 nucleic acids MAY BE PRESENT.   A presumptive positive result was obtained on the submitted specimen  and confirmed on repeat testing.  While 2019 novel coronavirus  (SARS-CoV-2) nucleic acids may be present in the submitted sample  additional confirmatory testing may be necessary for epidemiological  and / or clinical management purposes  to differentiate between  SARS-CoV-2 and other Sarbecovirus currently known to infect humans.  If clinically indicated additional testing with an alternate test  methodology [phone]) is advised. The SARS-CoV-2 RNA is generally  detectable in upper and lower respiratory sp ecimens during the acute  phase of infection. The expected result is Negative. Fact Sheet for Patients:  StrictlyIdeas.no Fact Sheet for Healthcare Providers: BankingDealers.co.za This test is not yet approved or cleared by the [place] FDA and has been authorized for detection and/or diagnosis  of SARS-CoV-2 by FDA under an Emergency Use Authorization (EUA).  This EUA will remain in effect (meaning this test can be used) for the duration of the COVID-19 declaration under Section 564(b)(1) of the Act, 21 U.S.C. section 360bbb-3(b)(1), unless the authorization is terminated or revoked sooner. Performed at [facility], [place] [address], [place], [place] [address]      Labs: BNP (last 3 results) Recent Labs    08/10/18 1750  BNP 96.2   Basic Metabolic Panel: Recent Labs  Lab 08/10/18 0744 08/10/18 1235 08/10/18 1335 08/11/18 0233  NA 133*  --  136 137  K 3.8  --  3.7 3.9  CL 97*  --  103 105  CO2 23  --  22 23  GLUCOSE 205*  --  119* 98  BUN 16  --  12 14  CREATININE 2.36* 2.20* 2.11* 1.98*  CALCIUM 9.4  --  9.1 8.9   Liver Function Tests: No results for input(s): AST, ALT, ALKPHOS,  BILITOT, PROT, ALBUMIN in the last 168 hours. No results for input(s): LIPASE, AMYLASE in the last 168 hours. No results for input(s): AMMONIA in the last 168 hours. CBC: Recent Labs  Lab 08/10/18 0744 08/11/18 0233  WBC 14.5* 10.3  NEUTROABS 11.8*  --   HGB 13.3 12.5*  HCT 41.2 38.6*  MCV 88.2 89.1  PLT 503* 475*   Cardiac Enzymes: No results for input(s): CKTOTAL, CKMB, CKMBINDEX, TROPONINI in the last 168 hours. BNP: Invalid input(s): POCBNP CBG: Recent Labs  Lab 08/10/18 1741 08/10/18 2118 08/11/18 0815 08/11/18 1138  GLUCAP 114* 119* 86 106*   D-Dimer No results for input(s): DDIMER in the last 72 hours. Hgb A1c No results for input(s): HGBA1C in the last 72 hours. Lipid Profile No results for input(s): CHOL, HDL, LDLCALC, TRIG, CHOLHDL, LDLDIRECT in the last 72 hours. Thyroid function studies No results for input(s): TSH, T4TOTAL, T3FREE, THYROIDAB in the last 72 hours.  Invalid input(s): FREET3 Anemia work up No results for input(s): VITAMINB12, FOLATE, FERRITIN, TIBC, IRON, RETICCTPCT in the last 72 hours. Urinalysis    Component Value Date/Time   COLORURINE STRAW (A) 08/10/2018 1127   APPEARANCEUR CLEAR 08/10/2018 1127   LABSPEC 1.004 (L) 08/10/2018 1127   PHURINE 7.0 08/10/2018 1127   GLUCOSEU NEGATIVE 08/10/2018 1127   GLUCOSEU >=1000 (A) 01/19/2016 1429   HGBUR SMALL (A) 08/10/2018 1127   BILIRUBINUR NEGATIVE 08/10/2018 1127   BILIRUBINUR neg 01/24/2017 1533   KETONESUR NEGATIVE 08/10/2018 1127   PROTEINUR 30 (A) 08/10/2018 1127   UROBILINOGEN 0.2 01/24/2017 1533   UROBILINOGEN 0.2 01/19/2016 1429   NITRITE NEGATIVE 08/10/2018 1127   LEUKOCYTESUR NEGATIVE 08/10/2018 1127   Sepsis Labs Invalid input(s): PROCALCITONIN,  WBC,  LACTICIDVEN Microbiology Recent Results (from the past 240 hour(s))  SARS Coronavirus 2 (CEPHEID - Performed in [place] hospital lab), Hosp Order     Status: None   Collection Time: 08/10/18  8:51 AM  Result Value  Ref Range Status   SARS Coronavirus 2 NEGATIVE NEGATIVE Final    Comment: (NOTE) If result is NEGATIVE SARS-CoV-2 target nucleic acids are NOT DETECTED. The SARS-CoV-2 RNA is generally detectable in upper and lower  respiratory specimens during the acute phase of infection. The lowest  concentration of SARS-CoV-2 viral copies this assay can detect is 250  copies / mL. A negative result does not preclude SARS-CoV-2 infection  and should not be used as the sole basis for treatment or other  patient management decisions.  A negative result may occur with  improper specimen collection / handling, submission of specimen other  than nasopharyngeal swab, presence of viral mutation(s) within the  areas targeted by this assay, and inadequate number of viral copies  (<250 copies / mL). A negative result must be combined with clinical  observations, patient history, and epidemiological information. If result is POSITIVE SARS-CoV-2 target nucleic acids are DETECTED. The SARS-CoV-2 RNA is generally detectable in upper and lower  respiratory specimens dur ing the acute phase of infection.  Positive  results are indicative of active infection with SARS-CoV-2.  Clinical  correlation with patient history and other diagnostic information is  necessary to determine patient infection status.  Positive results do  not rule out bacterial infection or co-infection with other viruses. If result is PRESUMPTIVE POSTIVE SARS-CoV-2 nucleic acids MAY BE PRESENT.   A presumptive positive result was obtained on the submitted specimen  and confirmed on repeat testing.  While 2019 novel coronavirus  (SARS-CoV-2) nucleic acids may be present in the submitted sample  additional confirmatory testing may be necessary for epidemiological  and / or clinical management purposes  to differentiate between  SARS-CoV-2 and other Sarbecovirus currently known to infect humans.  If clinically indicated additional testing with an  alternate test  methodology [phone]) is advised. The SARS-CoV-2 RNA is generally  detectable in upper and lower respiratory sp ecimens during the acute  phase of infection. The expected result is Negative. Fact Sheet for Patients:  StrictlyIdeas.no Fact Sheet for Healthcare Providers: BankingDealers.co.za This test is not yet approved or cleared by the [place] FDA and has been authorized for detection and/or diagnosis of SARS-CoV-2 by FDA under an Emergency Use Authorization (EUA).  This EUA will remain in effect (meaning this test can be used) for the duration of the COVID-19 declaration under Section 564(b)(1) of the Act, 21 U.S.C. section 360bbb-3(b)(1), unless the authorization is terminated or revoked sooner. Performed at [facility], [place] [address] [place], [place] [address]      Time coordinating discharge: 35 minutes  SIGNED:   Nicolette Bang, MD  [facility] 08/11/2018, 2:03 PM Pager   If 7PM-7AM, please contact night-coverage [email or web address] Password [email or web address]

## 2018-08-11 NOTE — BH Assessment (Signed)
Patient arrived for [facility] admission, unable to complete admission assessment related to patient altered level of consciousness. Patient skin search completed. Patient offered support and encouragement.

## 2018-08-11 NOTE — ED Notes (Signed)
Pt in purple scrubs with all personal items removed upon arrival

## 2018-08-11 NOTE — ED Provider Notes (Signed)
[facility] EMERGENCY DEPARTMENT Provider Note   CSN: [id number] Arrival date & time: 08/11/18  1903    History   Chief Complaint Chief Complaint  Patient presents with  . Altered Mental Status    HPI [name] [name]. is a 53 y.o. male history diabetes, hypertension, hyperlipidemia, schizophrenia here presenting with altered mental status.  Patient was actually seen in the ED yesterday was admitted to the hospital.  He was diagnosed with acute renal failure at that time and had a normal CT head as well as chest x-ray and urinalysis.  IVC paperwork was filled out by previous provider.  Patient's kidney function improved today and was sent to behavioral health.  Upon arrival of behavioral health, patient was noted to be very altered and confused.  Patient was sent back to the ED for further evaluation.  Patient is not making much sense and cannot give a coherent history.  Per the chart review, patient apparently was very depressed and hasn't slept for several days.      The history is provided by the patient.   Level V caveat- AMS   Past Medical History:  Diagnosis Date  . Diabetes mellitus type II, uncontrolled ([name] [name])   . HTN (hypertension)   . Hyperlipidemia LDL goal <70   . Schizophrenia [facility])     Patient Active Problem List   Diagnosis Date Noted  . Schizophrenia ([place]) 08/11/2018  . Impacted cerumen of right ear 02/02/2018  . Schizophrenia, paranoid type ([place]) 11/18/2017  . DKA (diabetic ketoacidoses) ([place]) 11/05/2017  . Acute renal failure superimposed on stage 3 chronic kidney disease ([place]) 11/05/2017  . Type II diabetes mellitus with renal manifestations ([place]) 11/05/2017  . Depression 11/05/2017  . Leukocytosis 11/05/2017  . Cough 11/05/2017  . Tobacco abuse 11/05/2017  . Schizoaffective disorder, bipolar type ([place]) 06/10/2016  . DM (diabetes mellitus) type II uncontrolled, periph vascular disorder ([place]) 02/29/2016  . Schizophrenia,  disorganized type ([place]) 06/02/2015  . Hyperlipidemia 09/23/2014  . Renal insufficiency 09/23/2014  . HTN (hypertension) 03/22/2013  . Seasonal allergies 03/22/2013    No past surgical history on file.      Home Medications    Prior to Admission medications   Medication Sig Start Date End Date Taking? Authorizing Provider  ACCU-CHEK AVIVA PLUS test strip TEST 4 X DAILY 05/02/18   [provider]  ACCU-CHEK FASTCLIX LANCETS MISC CHECK BLOOD SUGAR 4X DAILY 05/02/18   [provider]  aspirin EC 81 MG tablet Take 1 tablet (81 mg total) by mouth daily. For heart health 12/02/17   Lindell Spar I, NP  blood glucose meter kit and supplies KIT Dispense based on patient and insurance preference. Use up to four times daily as directed. (FOR ICD-9 250.00, 250.01). 04/06/18   Carollee Herter, Kendrick Fries R, DO  fenofibrate 160 MG tablet TAKE 1 TABLET BY MOUTH EVERY DAY Patient taking differently: Take 160 mg by mouth daily.  07/21/18   Roma Schanz R, DO  glyBURIDE (DIABETA) 5 MG tablet Take 1 tablet (5 mg total) by mouth daily with breakfast for 30 days. 08/11/18 09/10/18  Spongberg, Audie Pinto, MD  Insulin Pen Needle (PEN NEEDLES) 32G X 6 MM MISC Inject 1 each into the skin daily. 05/04/18   Carollee Herter, Yvonne R, DO  LANTUS SOLOSTAR 100 UNIT/ML Solostar Pen Inject 60 Units into the skin at bedtime.  05/07/18   [provider]  metoprolol tartrate (LOPRESSOR) 25 MG tablet TAKE 1 TABLET BY MOUTH TWICE  A DAY Patient taking differently: Take 25 mg by mouth daily.  04/19/18   Ann Held, DO  minocycline (MINOCIN) 100 MG capsule Take 1 capsule (100 mg total) by mouth 2 (two) times daily for 7 days. 08/11/18 08/18/18  Marcell Anger, MD  mirtazapine (REMERON) 30 MG tablet Take 1 tablet (30 mg total) by mouth at bedtime. 06/08/18 06/08/19  Arfeen, Arlyce Harman, MD  multivitamin-iron-minerals-folic acid (CENTRUM) chewable tablet Chew 1 tablet by mouth daily. 02/02/18   Lowne  Chase, Alferd Apa, DO  OVER THE COUNTER MEDICATION CBD OIL  VAPE OIL    [provider]  risperiDONE (RISPERDAL) 2 MG tablet Take 1 tablet (2 mg) qam, take 2 tablets (4 mg) qhs Patient taking differently: Take 2 mg by mouth See admin instructions. Take t (2 mg) in he morning  (4 mg) in the evening 06/08/18   Arfeen, Arlyce Harman, MD  simvastatin (ZOCOR) 40 MG tablet TAKE 1 TABLET BY MOUTH EVERY DAY Patient taking differently: Take 40 mg by mouth daily at 6 PM.  04/24/18   Carollee Herter, Alferd Apa, DO  traZODone (DESYREL) 100 MG tablet Take 1 tablet (100 mg total) by mouth at bedtime as needed for sleep. 06/08/18   Arfeen, Arlyce Harman, MD  Vitamin D, Ergocalciferol, (DRISDOL) 50000 units CAPS capsule Take 1 capsule (50,000 Units total) by mouth once a week. For bone health Patient taking differently: Take 50,000 Units by mouth once a week. Monday 12/05/17   Encarnacion Slates, NP    Family History Family History  Problem Relation Age of Onset  . Breast cancer Mother   . Diabetes Mother   . Prostate cancer Father   . Hypertension Father   . Breast cancer Maternal Aunt   . Diabetes Maternal Uncle     Social History Social History   Tobacco Use  . Smoking status: Current Every Day Smoker    Packs/day: 1.25    Years: 30.00    Pack years: 37.50    Types: Cigarettes, Cigars  . Smokeless tobacco: Never Used  Substance Use Topics  . Alcohol use: Yes    Comment: occasional beer  . Drug use: Yes    Types: Marijuana     Allergies   Patient has no known allergies.   Review of Systems Review of Systems  Psychiatric/Behavioral: Positive for confusion.  All other systems reviewed and are negative.    Physical Exam Updated Vital Signs BP (!) 141/73   Pulse (!) 102   Temp 98.5 F (36.9 C) (Rectal)   Resp 20   Ht _0  (1.702 m)   Wt 127 kg   SpO2 92%   BMI 43.85 kg/m   Physical Exam Vitals signs and nursing note reviewed.  Constitutional:      Comments: Confused, disoriented    HENT:     Head: Normocephalic.     Right Ear: Tympanic membrane normal.     Left Ear: Tympanic membrane normal.     Mouth/Throat:     Mouth: Mucous membranes are moist.  Eyes:     Extraocular Movements: Extraocular movements intact.     Pupils: Pupils are equal, round, and reactive to light.  Neck:     Musculoskeletal: Normal range of motion.  Cardiovascular:     Rate and Rhythm: Normal rate.     Pulses: Normal pulses.     Heart sounds: Normal heart sounds.  Pulmonary:     Effort: Pulmonary effort is normal.  Breath sounds: Normal breath sounds.  Abdominal:     General: Abdomen is flat.     Palpations: Abdomen is soft.  Musculoskeletal: Normal range of motion.  Skin:    General: Skin is warm.     Capillary Refill: Capillary refill takes less than 2 seconds.  Neurological:     Comments: Confused, moving all extremities, difficult to cooperate with exam       ED Treatments / Results  Labs (all labs ordered are listed, but only abnormal results are displayed) Labs Reviewed  CBC WITH DIFFERENTIAL/PLATELET - Abnormal; Notable for the following components:      Result Value   Platelets 477 (*)    Monocytes Absolute 1.1 (*)    All other components within normal limits  COMPREHENSIVE METABOLIC PANEL - Abnormal; Notable for the following components:   CO2 21 (*)    Creatinine, Ser 2.11 (*)    Alkaline Phosphatase 35 (*)    GFR calc non Af Amer 35 (*)    GFR calc Af Amer 41 (*)    All other components within normal limits  POCT I-STAT 7, (LYTES, BLD GAS, ICA,H+H) - Abnormal; Notable for the following components:   pH, Arterial 7.201 (*)    pCO2 arterial 67.2 (*)    Acid-base deficit 3.0 (*)    All other components within normal limits  CULTURE, BLOOD (ROUTINE X 2)  CULTURE, BLOOD (ROUTINE X 2)  TROPONIN I  AMMONIA  CK  URINALYSIS, ROUTINE W REFLEX MICROSCOPIC  RAPID URINE DRUG SCREEN, HOSP PERFORMED  BLOOD GAS, ARTERIAL  LACTIC ACID, PLASMA  LACTIC ACID, PLASMA   BRAIN NATRIURETIC PEPTIDE    EKG EKG Interpretation  Date/Time:  Friday Aug 11 2018 20:09:41 EDT Ventricular Rate:  103 PR Interval:    QRS Duration: 78 QT Interval:  325 QTC Calculation: 426 R Axis:   49 Text Interpretation:  Sinus tachycardia Low voltage, precordial leads No significant change since last tracing Confirmed by Wandra Arthurs [phone]) on 08/11/2018 8:15:12 PM   Radiology Dg Chest 1 View  Result Date: 08/10/2018 CLINICAL DATA:  Hypoxia EXAM: CHEST  1 VIEW COMPARISON:  01/13/2018 FINDINGS: The cardiac silhouette is mildly enlarged. There are prominent interstitial lung markings bilaterally. There are old healed right-sided rib fractures. There is no pneumothorax. No large pleural effusion. IMPRESSION: Borderline enlarged heart with mild prominence of the interstitial lung markings suggestive of volume overload. Electronically Signed   By: Constance Holster M.D.   On: 08/10/2018 20:28   Ct Head Wo Contrast  Result Date: 08/10/2018 CLINICAL DATA:  Altered mental status. Insomnia. History of schizophrenia EXAM: CT HEAD WITHOUT CONTRAST TECHNIQUE: Contiguous axial images were obtained from the base of the skull through the vertex without intravenous contrast. COMPARISON:  None. FINDINGS: Brain: There is mild diffuse atrophy. There is no intracranial mass, hemorrhage, extra-axial fluid collection, or midline shift. Brain parenchyma appears unremarkable. No evident acute infarct. Vascular: There is no appreciable hyperdense vessel. There is no appreciable vascular calcification. Skull: The bony calvarium appears intact. Sinuses/Orbits: There is mucosal thickening in several ethmoid air cells. Other visualized paranasal sinuses are clear. Frontal sinuses are hypoplastic. Orbits appear symmetric bilaterally. Other: Mastoid air cells are clear. There is debris in each external auditory canal. IMPRESSION: Mild diffuse atrophy. The brain parenchyma appears unremarkable. No acute infarct. No  mass or hemorrhage. Mucosal thickening noted in several ethmoid air cells. There is probable cerumen in the external auditory canals. Electronically Signed   By: Lowella Grip  III M.D.   On: 08/10/2018 08:29   Dg Chest Port 1 View  Result Date: 08/11/2018 CLINICAL DATA:  Altered mental status EXAM: PORTABLE CHEST 1 VIEW COMPARISON:  08/10/2018 FINDINGS: Low lung volumes. Cardiomegaly with mild central congestion. No focal airspace disease or effusion. No pneumothorax. IMPRESSION: Cardiomegaly with mild central congestion. Low lung volume without focal airspace disease. Electronically Signed   By: Donavan Foil M.D.   On: 08/11/2018 23:12    Procedures Procedures (including critical care time)  Medications Ordered in ED Medications  acetaminophen (TYLENOL) tablet 650 mg (has no administration in time range)  alum & mag hydroxide-simeth (MAALOX/MYLANTA) 200-200-20 MG/5ML suspension 30 mL (has no administration in time range)  magnesium hydroxide (MILK OF MAGNESIA) suspension 30 mL (has no administration in time range)  traZODone (DESYREL) tablet 100 mg (has no administration in time range)  aspirin chewable tablet 81 mg (has no administration in time range)  docusate sodium (COLACE) capsule 100 mg (0 mg Oral Hold 08/11/18 2220)  fenofibrate tablet 160 mg (has no administration in time range)  insulin glargine (LANTUS) injection 60 Units (has no administration in time range)  metoprolol tartrate (LOPRESSOR) tablet 25 mg (0 mg Oral Hold 08/11/18 2221)  minocycline (MINOCIN) capsule 100 mg (has no administration in time range)  mirtazapine (REMERON) tablet 30 mg (0 mg Oral Hold 08/11/18 2222)  nicotine (NICODERM CQ - dosed in mg/24 hours) patch 14 mg (has no administration in time range)  risperiDONE (RISPERDAL) tablet 2 mg (has no administration in time range)  risperiDONE (RISPERDAL) tablet 4 mg (0 mg Oral Hold 08/11/18 2222)  simvastatin (ZOCOR) tablet 40 mg (0 mg Oral Hold 08/11/18 2222)   insulin aspart (novoLOG) injection 0-5 Units (has no administration in time range)  insulin aspart (novoLOG) injection 0-15 Units (has no administration in time range)  haloperidol (HALDOL) tablet 5 mg (has no administration in time range)    Or  haloperidol lactate (HALDOL) injection 5 mg (has no administration in time range)  LORazepam (ATIVAN) tablet 2 mg (has no administration in time range)    Or  LORazepam (ATIVAN) injection 2 mg (has no administration in time range)  diphenhydrAMINE (BENADRYL) capsule 50 mg (has no administration in time range)    Or  diphenhydrAMINE (BENADRYL) injection 50 mg (has no administration in time range)  LORazepam (ATIVAN) injection 2 mg (2 mg Intravenous Given 08/11/18 2052)  haloperidol lactate (HALDOL) injection 5 mg (5 mg Intravenous Given 08/11/18 2052)     Initial Impression / Assessment and Plan / ED Course  I have reviewed the triage vital signs and the nursing notes.  Pertinent labs & imaging results that were available during my care of the patient were reviewed by me and considered in my medical decision making (see chart for details).       Naif Alabi. is a 53 y.o. male here with confusion.  Patient is unable to give any meaningful history.  Patient had a negative CT head as well as chest x-ray and urinalysis and had a improving renal failure.  At this point, I am unclear why he is altered.  We will recheck kidney function as well as add on a CK level.  We will also consider frontal lobe infarcts so will get MRI brain. Afebrile, no meningeal signs.   10:30 pm Patient is sedated now. His repeat labs unremarkable. Afebrile rectally. He is delirious and I think needs admission. I talked to Dr. Shanon Brow from hospitalist. She requested MRi brain,  ABG, repeat UA and UDS.   11:25 PM MRI pending. ABG showed pH 7.2, CO2 67. Likely need admission. Signed out to Dr. Betsey Holiday in the ED.   Final Clinical Impressions(s) / ED Diagnoses   Final  diagnoses:  None    ED Discharge Orders    None       Drenda Freeze, MD 08/11/18 2326

## 2018-08-12 ENCOUNTER — Inpatient Hospital Stay (HOSPITAL_COMMUNITY): Payer: Medicare HMO

## 2018-08-12 ENCOUNTER — Inpatient Hospital Stay (HOSPITAL_COMMUNITY)
Admission: AD | Admit: 2018-08-12 | Discharge: 2018-08-25 | DRG: 091 | Disposition: A | Discharge status: Other Acute Inpatient Hospital | Payer: Medicare HMO | Source: Other Acute Inpatient Hospital | Attending: Family Medicine | Admitting: Family Medicine

## 2018-08-12 DIAGNOSIS — Z1159 Encounter for screening for other viral diseases: Secondary | ICD-10-CM | POA: Diagnosis not present

## 2018-08-12 DIAGNOSIS — F201 Disorganized schizophrenia: Secondary | ICD-10-CM | POA: Diagnosis present

## 2018-08-12 DIAGNOSIS — Z9114 Patient's other noncompliance with medication regimen: Secondary | ICD-10-CM

## 2018-08-12 DIAGNOSIS — T402X5A Adverse effect of other opioids, initial encounter: Secondary | ICD-10-CM | POA: Diagnosis present

## 2018-08-12 DIAGNOSIS — N179 Acute kidney failure, unspecified: Secondary | ICD-10-CM | POA: Diagnosis present

## 2018-08-12 DIAGNOSIS — Z8249 Family history of ischemic heart disease and other diseases of the circulatory system: Secondary | ICD-10-CM

## 2018-08-12 DIAGNOSIS — J9601 Acute respiratory failure with hypoxia: Secondary | ICD-10-CM | POA: Diagnosis present

## 2018-08-12 DIAGNOSIS — IMO0002 Reserved for concepts with insufficient information to code with codable children: Secondary | ICD-10-CM | POA: Diagnosis present

## 2018-08-12 DIAGNOSIS — Z833 Family history of diabetes mellitus: Secondary | ICD-10-CM

## 2018-08-12 DIAGNOSIS — I10 Essential (primary) hypertension: Secondary | ICD-10-CM | POA: Diagnosis not present

## 2018-08-12 DIAGNOSIS — G92 Toxic encephalopathy: Secondary | ICD-10-CM | POA: Diagnosis present

## 2018-08-12 DIAGNOSIS — E860 Dehydration: Secondary | ICD-10-CM | POA: Diagnosis present

## 2018-08-12 DIAGNOSIS — R0602 Shortness of breath: Secondary | ICD-10-CM | POA: Diagnosis not present

## 2018-08-12 DIAGNOSIS — E1122 Type 2 diabetes mellitus with diabetic chronic kidney disease: Secondary | ICD-10-CM | POA: Diagnosis present

## 2018-08-12 DIAGNOSIS — E1151 Type 2 diabetes mellitus with diabetic peripheral angiopathy without gangrene: Secondary | ICD-10-CM | POA: Diagnosis not present

## 2018-08-12 DIAGNOSIS — G4700 Insomnia, unspecified: Secondary | ICD-10-CM | POA: Diagnosis present

## 2018-08-12 DIAGNOSIS — E1165 Type 2 diabetes mellitus with hyperglycemia: Secondary | ICD-10-CM | POA: Diagnosis not present

## 2018-08-12 DIAGNOSIS — L03116 Cellulitis of left lower limb: Secondary | ICD-10-CM | POA: Diagnosis present

## 2018-08-12 DIAGNOSIS — J9602 Acute respiratory failure with hypercapnia: Secondary | ICD-10-CM | POA: Diagnosis present

## 2018-08-12 DIAGNOSIS — F209 Schizophrenia, unspecified: Secondary | ICD-10-CM | POA: Diagnosis present

## 2018-08-12 DIAGNOSIS — T424X5A Adverse effect of benzodiazepines, initial encounter: Secondary | ICD-10-CM | POA: Diagnosis present

## 2018-08-12 DIAGNOSIS — Y92230 Patient room in hospital as the place of occurrence of the external cause: Secondary | ICD-10-CM | POA: Diagnosis present

## 2018-08-12 DIAGNOSIS — F250 Schizoaffective disorder, bipolar type: Secondary | ICD-10-CM | POA: Diagnosis present

## 2018-08-12 DIAGNOSIS — Z794 Long term (current) use of insulin: Secondary | ICD-10-CM | POA: Diagnosis not present

## 2018-08-12 DIAGNOSIS — N183 Chronic kidney disease, stage 3 (moderate): Secondary | ICD-10-CM | POA: Diagnosis present

## 2018-08-12 DIAGNOSIS — F23 Brief psychotic disorder: Secondary | ICD-10-CM | POA: Diagnosis not present

## 2018-08-12 DIAGNOSIS — N289 Disorder of kidney and ureter, unspecified: Secondary | ICD-10-CM | POA: Diagnosis not present

## 2018-08-12 DIAGNOSIS — I129 Hypertensive chronic kidney disease with stage 1 through stage 4 chronic kidney disease, or unspecified chronic kidney disease: Secondary | ICD-10-CM | POA: Diagnosis present

## 2018-08-12 DIAGNOSIS — G9341 Metabolic encephalopathy: Secondary | ICD-10-CM | POA: Diagnosis present

## 2018-08-12 DIAGNOSIS — E785 Hyperlipidemia, unspecified: Secondary | ICD-10-CM | POA: Diagnosis present

## 2018-08-12 DIAGNOSIS — F17210 Nicotine dependence, cigarettes, uncomplicated: Secondary | ICD-10-CM | POA: Diagnosis present

## 2018-08-12 LAB — GLUCOSE, CAPILLARY
Glucose-Capillary: 86 mg/dL (ref 70–99)
Glucose-Capillary: 84 mg/dL (ref 70–99)
Glucose-Capillary: 165 mg/dL — ABNORMAL HIGH (ref 70–99)
Glucose-Capillary: 116 mg/dL — ABNORMAL HIGH (ref 70–99)
Glucose-Capillary: 127 mg/dL — ABNORMAL HIGH (ref 70–99)

## 2018-08-12 LAB — POCT I-STAT 7, (LYTES, BLD GAS, ICA,H+H)
Acid-base deficit: 3 mmol/L — ABNORMAL HIGH (ref 0.0–2.0)
Bicarbonate: 24.7 mmol/L (ref 20.0–28.0)
Calcium, Ion: 1.25 mmol/L (ref 1.15–1.40)
HCT: 42 % (ref 39.0–52.0)
Hemoglobin: 14.3 g/dL (ref 13.0–17.0)
O2 Saturation: 97 %
Patient temperature: 98.6
Potassium: 4.5 mmol/L (ref 3.5–5.1)
Sodium: 140 mmol/L (ref 135–145)
TCO2: 26 mmol/L (ref 22–32)
pCO2 arterial: 54.6 mmHg — ABNORMAL HIGH (ref 32.0–48.0)
pH, Arterial: 7.264 — ABNORMAL LOW (ref 7.350–7.450)
pO2, Arterial: 110 mmHg — ABNORMAL HIGH (ref 83.0–108.0)

## 2018-08-12 LAB — COMPREHENSIVE METABOLIC PANEL
ALT: 21 U/L (ref 0–44)
AST: 27 U/L (ref 15–41)
Albumin: 3.5 g/dL (ref 3.5–5.0)
Alkaline Phosphatase: 42 U/L (ref 38–126)
Anion gap: 13 (ref 5–15)
BUN: 17 mg/dL (ref 6–20)
CO2: 24 mmol/L (ref 22–32)
Calcium: 9.1 mg/dL (ref 8.9–10.3)
Chloride: 103 mmol/L (ref 98–111)
Creatinine, Ser: 2.07 mg/dL — ABNORMAL HIGH (ref 0.61–1.24)
GFR calc Af Amer: 42 mL/min — ABNORMAL LOW (ref >60)
GFR calc non Af Amer: 36 mL/min — ABNORMAL LOW (ref >60)
Glucose, Bld: 92 mg/dL (ref 70–99)
Potassium: 4.6 mmol/L (ref 3.5–5.1)
Sodium: 140 mmol/L (ref 135–145)
Total Bilirubin: 0.3 mg/dL (ref 0.3–1.2)
Total Protein: 6.9 g/dL (ref 6.5–8.1)

## 2018-08-12 LAB — CBC
HCT: 44.6 % (ref 39.0–52.0)
Hemoglobin: 14.1 g/dL (ref 13.0–17.0)
MCH: 29.2 pg (ref 26.0–34.0)
MCHC: 31.6 g/dL (ref 30.0–36.0)
MCV: 92.3 fL (ref 80.0–100.0)
Platelets: 438 10*3/uL — ABNORMAL HIGH (ref 150–400)
RBC: 4.83 MIL/uL (ref 4.22–5.81)
RDW: 14.4 % (ref 11.5–15.5)
WBC: 12.3 10*3/uL — ABNORMAL HIGH (ref 4.0–10.5)
nRBC: 0 % (ref 0.0–0.2)

## 2018-08-12 LAB — LACTIC ACID, PLASMA: Lactic Acid, Venous: 0.4 mmol/L — ABNORMAL LOW (ref 0.5–1.9)

## 2018-08-12 MED ORDER — ONDANSETRON HCL 4 MG/2ML IJ SOLN: 4.0000 mg | Freq: Four times a day (QID) | INTRAMUSCULAR | Status: DC | PRN

## 2018-08-12 MED ORDER — NALOXONE HCL 0.4 MG/ML IJ SOLN
0.4000 mg | Freq: Once | INTRAMUSCULAR | Status: AC
  Administered 2018-08-12: 0.4 mg via INTRAVENOUS
  Filled 2018-08-12: qty 1

## 2018-08-12 MED ORDER — SODIUM CHLORIDE 0.9% FLUSH: 3.0000 mL | INTRAVENOUS | Status: DC | PRN

## 2018-08-12 MED ORDER — SODIUM CHLORIDE 0.9 % IV SOLN: 250.0000 mL | INTRAVENOUS | Status: DC | PRN

## 2018-08-12 MED ORDER — ONDANSETRON HCL 4 MG PO TABS: 4.0000 mg | ORAL_TABLET | Freq: Four times a day (QID) | ORAL | Status: DC | PRN

## 2018-08-12 MED ORDER — ASPIRIN EC 81 MG PO TBEC
81.0000 mg | DELAYED_RELEASE_TABLET | Freq: Every day | ORAL | Status: DC
  Administered 2018-08-13 – 2018-08-25 (×13): 81 mg via ORAL
  Filled 2018-08-12 (×14): qty 1

## 2018-08-12 MED ORDER — MIRTAZAPINE 15 MG PO TABS
30.0000 mg | ORAL_TABLET | Freq: Every day | ORAL | Status: DC
  Administered 2018-08-12 – 2018-08-24 (×13): 30 mg via ORAL
  Filled 2018-08-12 (×13): qty 2

## 2018-08-12 MED ORDER — MINOCYCLINE HCL 100 MG PO CAPS
100.0000 mg | ORAL_CAPSULE | Freq: Two times a day (BID) | ORAL | Status: DC
  Filled 2018-08-12 (×2): qty 1

## 2018-08-12 MED ORDER — FLUMAZENIL 0.5 MG/5ML IV SOLN
0.3000 mg | Freq: Once | INTRAVENOUS | Status: AC
  Administered 2018-08-12: 0.3 mg via INTRAVENOUS
  Filled 2018-08-12: qty 5

## 2018-08-12 MED ORDER — GUAIFENESIN ER 600 MG PO TB12
600.0000 mg | ORAL_TABLET | Freq: Two times a day (BID) | ORAL | Status: DC
  Filled 2018-08-12: qty 1

## 2018-08-12 MED ORDER — ENOXAPARIN SODIUM 40 MG/0.4ML ~~LOC~~ SOLN
40.0000 mg | SUBCUTANEOUS | Status: DC
  Administered 2018-08-12 – 2018-08-24 (×12): 40 mg via SUBCUTANEOUS
  Filled 2018-08-12 (×11): qty 0.4

## 2018-08-12 MED ORDER — FLUMAZENIL 0.5 MG/5ML IV SOLN
0.2000 mg | Freq: Once | INTRAVENOUS | Status: AC
  Administered 2018-08-12: 0.2 mg via INTRAVENOUS
  Filled 2018-08-12: qty 5

## 2018-08-12 MED ORDER — NALOXONE HCL 0.4 MG/ML IJ SOLN
INTRAMUSCULAR | Status: AC
  Filled 2018-08-12: qty 1

## 2018-08-12 MED ORDER — NALOXONE HCL 0.4 MG/ML IJ SOLN
0.4000 mg | Freq: Once | INTRAMUSCULAR | Status: AC
  Administered 2018-08-12: 0.4 mg via INTRAVENOUS

## 2018-08-12 MED ORDER — SODIUM CHLORIDE 0.9% FLUSH
3.0000 mL | INTRAVENOUS | Status: DC | PRN
  Filled 2018-08-12: qty 3

## 2018-08-12 MED ORDER — HALOPERIDOL LACTATE 5 MG/ML IJ SOLN
2.0000 mg | Freq: Four times a day (QID) | INTRAMUSCULAR | Status: DC | PRN
  Administered 2018-08-12 – 2018-08-13 (×4): 2 mg via INTRAVENOUS
  Filled 2018-08-12 (×4): qty 1

## 2018-08-12 MED ORDER — SODIUM CHLORIDE 0.9% FLUSH
3.0000 mL | Freq: Two times a day (BID) | INTRAVENOUS | Status: DC
  Filled 2018-08-12: qty 3

## 2018-08-12 MED ORDER — MINOCYCLINE HCL 100 MG PO CAPS
100.0000 mg | ORAL_CAPSULE | Freq: Two times a day (BID) | ORAL | Status: DC
  Administered 2018-08-12 – 2018-08-25 (×26): 100 mg via ORAL
  Filled 2018-08-12 (×28): qty 1

## 2018-08-12 MED ORDER — RISPERIDONE 3 MG PO TABS
4.0000 mg | ORAL_TABLET | Freq: Every day | ORAL | Status: DC
  Administered 2018-08-12 – 2018-08-24 (×13): 4 mg via ORAL
  Filled 2018-08-12 (×13): qty 1

## 2018-08-12 MED ORDER — HALOPERIDOL LACTATE 5 MG/ML IJ SOLN: 2.0000 mg | Freq: Four times a day (QID) | INTRAMUSCULAR | Status: DC | PRN

## 2018-08-12 MED ORDER — ORAL CARE MOUTH RINSE
15.0000 mL | Freq: Two times a day (BID) | OROMUCOSAL | Status: DC
  Administered 2018-08-12 – 2018-08-22 (×15): 15 mL via OROMUCOSAL

## 2018-08-12 MED ORDER — TRAZODONE HCL 50 MG PO TABS
100.0000 mg | ORAL_TABLET | Freq: Every evening | ORAL | Status: DC | PRN
  Administered 2018-08-12 – 2018-08-23 (×6): 100 mg via ORAL
  Filled 2018-08-12 (×6): qty 2

## 2018-08-12 MED ORDER — CHLORHEXIDINE GLUCONATE 0.12 % MT SOLN
15.0000 mL | Freq: Two times a day (BID) | OROMUCOSAL | Status: DC
  Administered 2018-08-12 – 2018-08-24 (×24): 15 mL via OROMUCOSAL
  Filled 2018-08-12 (×25): qty 15

## 2018-08-12 MED ORDER — INSULIN ASPART 100 UNIT/ML ~~LOC~~ SOLN: 0.0000 [IU] | Freq: Every day | SUBCUTANEOUS | Status: DC

## 2018-08-12 MED ORDER — INSULIN ASPART 100 UNIT/ML ~~LOC~~ SOLN
0.0000 [IU] | Freq: Three times a day (TID) | SUBCUTANEOUS | Status: DC
  Administered 2018-08-12: 2 [IU] via SUBCUTANEOUS
  Administered 2018-08-13: 1 [IU] via SUBCUTANEOUS
  Administered 2018-08-14: 3 [IU] via SUBCUTANEOUS
  Administered 2018-08-18 – 2018-08-21 (×2): 2 [IU] via SUBCUTANEOUS
  Administered 2018-08-23: 1 [IU] via SUBCUTANEOUS

## 2018-08-12 MED ORDER — SODIUM CHLORIDE 0.9% FLUSH
3.0000 mL | Freq: Two times a day (BID) | INTRAVENOUS | Status: DC
  Administered 2018-08-12 – 2018-08-16 (×10): 3 mL via INTRAVENOUS

## 2018-08-12 MED ORDER — RISPERIDONE 2 MG PO TABS
2.0000 mg | ORAL_TABLET | Freq: Every day | ORAL | Status: DC
  Administered 2018-08-13 – 2018-08-25 (×13): 2 mg via ORAL
  Filled 2018-08-12 (×14): qty 1

## 2018-08-12 MED ORDER — METOPROLOL TARTRATE 25 MG PO TABS
25.0000 mg | ORAL_TABLET | Freq: Every day | ORAL | Status: DC
  Administered 2018-08-13 – 2018-08-23 (×11): 25 mg via ORAL
  Filled 2018-08-12 (×13): qty 1

## 2018-08-12 MED ORDER — SODIUM CHLORIDE 0.9 % IV SOLN
250.0000 mL | INTRAVENOUS | Status: DC | PRN
  Filled 2018-08-12: qty 250

## 2018-08-12 MED ORDER — LORAZEPAM 2 MG/ML IJ SOLN: 2.0000 mg | INTRAMUSCULAR | Status: DC | PRN

## 2018-08-12 NOTE — Progress Notes (Signed)
MRI attempted 130am, was able to obtain one series of axial diffusion and patient began thrashing and kicking, MRI stopped and images sent to radiologist

## 2018-08-12 NOTE — Progress Notes (Signed)
Patient is resting comfortably on 3L [place] with no respiratory distress noted. BIPAP is not needed at this time. RT will monitor as needed.

## 2018-08-12 NOTE — Plan of Care (Signed)

## 2018-08-12 NOTE — Progress Notes (Addendum)
PROGRESS NOTE    [name] [name].  PTW:[id number] DOB: 11/01/1965 DOA: 08/12/2018 PCP: Ann Held, DO    Brief Narrative:   [name] Suski. is a 53 y.o. male with medical history significant of DM; HTN; HLD; and schizophrenia presenting with  Psychosis. Caretaker reported that he had not slept since Sunday.  Patient was found to have a gun that he was pointing around in the lobby and also a knife, and [employer] took him into custody.  The patient was unable to provide history at the time of admission.  He was given sedatives prior to my evaluation and was obtunded.   Psych eval - increasingly agitated per caregiver.  IVC done after gun and knife in waiting room.  AKI - given bolus.  Will recheck BMP and if normalizing can go to inpatient [facility] today.  If not normalizing, can observe overnight for IV hydration.  Patient was discharged to behavioral health hospital on 08/11/2018 for further psychiatric care.  Apparently when patient arrived he was agitated and given Dilaudid and Ativan per nursing staff.  He became unresponsive and was transferred back to [facility] due to his current mental status.  Assessment & Plan:   Active Problems:   HTN (hypertension)   Renal insufficiency   Schizophrenia, disorganized type ([place])   DM (diabetes mellitus) type II uncontrolled, periph vascular disorder (HCC)   Schizoaffective disorder, bipolar type ([place])   Acute respiratory failure with hypercapnia (HCC)  Acute respiratory failure with hypoxia and hypercapnia Acute metabolic versus toxic encephalopathy Patient representing back to [facility] following recent discharge earlier in the evening to behavioral health hospital for further evaluation and treatment of his underlying psychosis secondary to schizophrenia.  Apparently, per nursing staff when patient was at the behavioral health hospital he was agitated and given sedatives to include Dilaudid and Ativan.  Following  administration of these medications, he became unresponsive and subsequently was transferred back to [facility].  On arrival to patient's room this morning, patient was noted to be obtunded on BiPAP.  No response to sternal rub and noted pinpoint pupils, likely secondary to drug overdose with benzodiazepines and narcotics.  Patient was given flumazenil 0.2 mg IV and Narcan 0.2 mg IV with mild improvement of his mentation.  This was followed by an additional dose of flumazenil 0.3 mg IV and Narcan 0.2 mg IV with resolution of his encephalopathy. --Continue to monitor respiratory status closely over the next 24 hours --Avoid narcotics and benzos in this patient --Supportive care --Continue one-to-one sitter --Titrate supplemental oxygen to maintain SPO2 greater than 92%  Schizophrenia with acute psychosis Patient initially presenting after being found with a gun and knife and subdued by [employer]  Per his roommate, he has not been sleeping recently and likely noncompliant with his home medications.  He has been deemed a danger to himself and others and has been recommended for inpatient behavioral health treatment.  Currently on involuntary commitment. --Reconsulted psychiatry, Dr. Darleene Cleaver; per his evaluation via telehealth, he was unable to participate in psychiatric evaluation today due to confusion and noted incoherence; but this is likely due to his underlying poorly controlled schizophrenia in which psychiatry should be actively managing. --Continue involuntary commitment, 1:1 safety sitter --continue Risperdal 2 mg every morning and 4 mg every afternoon. --Trazodone 100 mg nightly --Haldol 2 mg IV every 6 hours as needed for agitation  Essential hypertension BP well controlled, 112/69 this morning --Continue metoprolol tartrate 25 mg p.o. daily  Type 2 diabetes mellitus Last hemoglobin A1c 6.6 on 04/06/2018.  Was previously on metformin but discontinued due to renal  insufficiency; in favor of glyburide. --Hold oral hypoglycemics while inpatient --Consistent carbohydrate diet --Insulin sliding scale for coverage --CBG's qAC/HS  Cellulitis left lower extremity --Continue minocycline x7 days  Acute on CKD stage III Baseline creatinine between 1.6 - 2.0 over the past year.  Initially admitted with a creatinine of 2.36, likely secondary to prerenal failure secondary to dehydration and continuation of his home metformin. --Metformin discontinued --Avoid ACE inhibitors and NSAIDs --Creatinine improved, down to 2.07 today, very close back to his normal baseline --Monitor BMP daily   DVT prophylaxis: Lovenox Code Status: Full code Family Communication: None Disposition Plan: Plan discharge back to behavioral health hospital when medically ready   Consultants:   Psychiatry, Dr. Darleene Cleaver  Procedures:   none  Antimicrobials:   none   Subjective: Called to bedside early this morning by nursing staff for patient being unresponsive on BiPAP.  Apparently patient was transferred back to [facility] overnight after being discharged to behavioral health hospital after being found unresponsive following administration of reported Dilaudid and Ativan.  On evaluation patient is unable to be aroused verbally or by painful stimuli/sternal rub.  His eyes with bilateral pinpoint pupils.  Etiology presumed drug overdose.  Patient was given 2 rounds of flumazenil and Narcan with resolution of his encephalopathy.  Patient was taken off of BiPAP and sitting comfortably in bed.   Objective: Vitals:   08/12/18 0611 08/12/18 0657 08/12/18 0759 08/12/18 1100  BP: 112/69  118/74   Pulse: (!) 107  (!) 108 (!) 107  Resp: (!) 23  (!) 27   Temp: 97.9 F (36.6 C)     TempSrc: Axillary     SpO2: 100%  100% (!) 89%  Weight:  117 kg      Intake/Output Summary (Last 24 hours) at 08/12/2018 1353 Last data filed at 08/12/2018 1217 Gross per 24 hour  Intake 240 ml  Output  --  Net 240 ml   Filed Weights   08/12/18 0657  Weight: 117 kg    Examination:  General exam: Unresponsive to verbal or painful stimuli, on BiPAP Respiratory system: Coarse respiratory sounds bilaterally, on BiPAP actually 100%, no accessory muscle use Cardiovascular system: S1 & S2 heard, RRR. No JVD, murmurs, rubs, gallops or clicks. No pedal edema. Gastrointestinal system: Abdomen is nondistended, protuberant, soft and nontender. No organomegaly or masses felt. Normal bowel sounds heard. Central nervous system: Unresponsive Extremities: No pedal edema Skin: Left lower extremity with mild erythema no weeping no purulence no lesions Psychiatry: Judgement and insight appear normal. Mood & affect appropriate.     Data Reviewed: I have personally reviewed following labs and imaging studies  CBC: Recent Labs  Lab 08/10/18 0744 08/11/18 0233 08/11/18 2054 08/11/18 2222 08/12/18 0433 08/12/18 0440  WBC 14.5* 10.3 10.1  --  12.3*  --   NEUTROABS 11.8*  --  7.5  --   --   --   HGB 13.3 12.5* 13.1 13.3 14.1 14.3  HCT 41.2 38.6* 41.4 39.0 44.6 42.0  MCV 88.2 89.1 90.8  --  92.3  --   PLT 503* 475* 477*  --  438*  --    Basic Metabolic Panel: Recent Labs  Lab 08/10/18 0744 08/10/18 1235 08/10/18 1335 08/11/18 0233 08/11/18 2054 08/11/18 2222 08/12/18 0433 08/12/18 0440  NA 133*  --  136 137 138 141 140 140  K 3.8  --  3.7 3.9 4.0 4.0 4.6 4.5  CL 97*  --  103 105 105  --  103  --   CO2 23  --  22 23 21*  --  24  --   GLUCOSE 205*  --  119* 98 98  --  92  --   BUN 16  --  12 14 18   --  17  --   CREATININE 2.36* 2.20* 2.11* 1.98* 2.11*  --  2.07*  --   CALCIUM 9.4  --  9.1 8.9 9.2  --  9.1  --    GFR: Estimated Creatinine Clearance: 52.2 mL/min (A) (by C-G formula based on SCr of 2.07 mg/dL (H)). Liver Function Tests: Recent Labs  Lab 08/11/18 2054 08/12/18 0433  AST 26 27  ALT 21 21  ALKPHOS 35* 42  BILITOT 0.6 0.3  PROT 6.7 6.9  ALBUMIN 3.5 3.5   No  results for input(s): LIPASE, AMYLASE in the last 168 hours. Recent Labs  Lab 08/11/18 2054  AMMONIA 35   Coagulation Profile: No results for input(s): INR, PROTIME in the last 168 hours. Cardiac Enzymes: Recent Labs  Lab 08/11/18 2054  CKTOTAL 328  TROPONINI <0.03   BNP (last 3 results) No results for input(s): PROBNP in the last 8760 hours. HbA1C: No results for input(s): HGBA1C in the last 72 hours. CBG: Recent Labs  Lab 08/11/18 1138 08/11/18 1555 08/12/18 0610 08/12/18 0939 08/12/18 1220  GLUCAP 106* 108* 86 84 165*   Lipid Profile: No results for input(s): CHOL, HDL, LDLCALC, TRIG, CHOLHDL, LDLDIRECT in the last 72 hours. Thyroid Function Tests: No results for input(s): TSH, T4TOTAL, FREET4, T3FREE, THYROIDAB in the last 72 hours. Anemia Panel: No results for input(s): VITAMINB12, FOLATE, FERRITIN, TIBC, IRON, RETICCTPCT in the last 72 hours. Sepsis Labs: Recent Labs  Lab 08/11/18 2318  LATICACIDVEN 0.4*    Recent Results (from the past 240 hour(s))  SARS Coronavirus 2 (CEPHEID - Performed in [place] hospital lab), Hosp Order     Status: None   Collection Time: 08/10/18  8:51 AM  Result Value Ref Range Status   SARS Coronavirus 2 NEGATIVE NEGATIVE Final    Comment: (NOTE) If result is NEGATIVE SARS-CoV-2 target nucleic acids are NOT DETECTED. The SARS-CoV-2 RNA is generally detectable in upper and lower  respiratory specimens during the acute phase of infection. The lowest  concentration of SARS-CoV-2 viral copies this assay can detect is 250  copies / mL. A negative result does not preclude SARS-CoV-2 infection  and should not be used as the sole basis for treatment or other  patient management decisions.  A negative result may occur with  improper specimen collection / handling, submission of specimen other  than nasopharyngeal swab, presence of viral mutation(s) within the  areas targeted by this assay, and inadequate number of viral copies    (<250 copies / mL). A negative result must be combined with clinical  observations, patient history, and epidemiological information. If result is POSITIVE SARS-CoV-2 target nucleic acids are DETECTED. The SARS-CoV-2 RNA is generally detectable in upper and lower  respiratory specimens dur ing the acute phase of infection.  Positive  results are indicative of active infection with SARS-CoV-2.  Clinical  correlation with patient history and other diagnostic information is  necessary to determine patient infection status.  Positive results do  not rule out bacterial infection or co-infection with other viruses. If result is PRESUMPTIVE POSTIVE SARS-CoV-2 nucleic acids MAY BE PRESENT.   A  presumptive positive result was obtained on the submitted specimen  and confirmed on repeat testing.  While 2019 novel coronavirus  (SARS-CoV-2) nucleic acids may be present in the submitted sample  additional confirmatory testing may be necessary for epidemiological  and / or clinical management purposes  to differentiate between  SARS-CoV-2 and other Sarbecovirus currently known to infect humans.  If clinically indicated additional testing with an alternate test  methodology [phone]) is advised. The SARS-CoV-2 RNA is generally  detectable in upper and lower respiratory sp ecimens during the acute  phase of infection. The expected result is Negative. Fact Sheet for Patients:  StrictlyIdeas.no Fact Sheet for Healthcare Providers: BankingDealers.co.za This test is not yet approved or cleared by the [place] FDA and has been authorized for detection and/or diagnosis of SARS-CoV-2 by FDA under an Emergency Use Authorization (EUA).  This EUA will remain in effect (meaning this test can be used) for the duration of the COVID-19 declaration under Section 564(b)(1) of the Act, 21 U.S.C. section 360bbb-3(b)(1), unless the authorization is terminated  or revoked sooner. Performed at [facility], [place] [address] [place], [place] [address]          Radiology Studies: Dg Chest 1 View  Result Date: 08/10/2018 CLINICAL DATA:  Hypoxia EXAM: CHEST  1 VIEW COMPARISON:  01/13/2018 FINDINGS: The cardiac silhouette is mildly enlarged. There are prominent interstitial lung markings bilaterally. There are old healed right-sided rib fractures. There is no pneumothorax. No large pleural effusion. IMPRESSION: Borderline enlarged heart with mild prominence of the interstitial lung markings suggestive of volume overload. Electronically Signed   By: Constance Holster M.D.   On: 08/10/2018 20:28   Mr Brain Wo Contrast  Result Date: 08/12/2018 CLINICAL DATA:  Initial evaluation for acute altered mental status, confusion. EXAM: MRI HEAD WITHOUT CONTRAST TECHNIQUE: Multiplanar, multiecho pulse sequences of the brain and surrounding structures were obtained without intravenous contrast. COMPARISON:  Prior CT from 08/10/2018 FINDINGS: Brain: Examination markedly limited as the patient was unable to tolerate the full length of the study. Axial diffusion-weighted imaging only was performed. Additionally, images provided are moderately degraded by motion artifact. DWI sequence demonstrates apparent somewhat linear focus of increased diffusion overlying the anterior left temporal lobe, adjacent to the left temporal horn (series 9, image 65). This measures approximately 2 cm in length. Unclear whether this reflects artifact or possibly acute ischemia. No other diffusion abnormality to suggest acute or subacute ischemia. Gray-white matter differentiation otherwise grossly maintained. No discernible mass lesion, mass effect, or midline shift. No hydrocephalus. No extra-axial fluid collection. Vascular: Not well assessed on this limited examination. Skull and upper cervical spine: Not well assessed on this limited examination. Sinuses/Orbits: Not well assessed on this  limited examination. Other: None. IMPRESSION: 1. Technically limited exam due to patient's inability to tolerate the full length of the exam and motion artifact. Axial DWI imaging only was performed. 2. Apparent 2 cm focus of linear diffusion abnormality involving the left temporal lobe as above. While this finding may be artifactual nature, sequelae of acute ischemia difficult to exclude, and could be considered in the correct clinical setting. If clinical picture is equivocal for possible ischemia, a repeat study could be performed for further evaluation as the patient is able to tolerate. Electronically Signed   By: Jeannine Boga M.D.   On: 08/12/2018 02:00   Dg Chest Port 1 View  Result Date: 08/12/2018 CLINICAL DATA:  Dyspnea EXAM: PORTABLE CHEST 1 VIEW COMPARISON:  Chest radiograph from  earlier today. FINDINGS: Stable cardiomediastinal silhouette with normal heart size. No pneumothorax. No pleural effusion. Mild scarring versus atelectasis at the left costophrenic angle. No pulmonary edema. No acute consolidative airspace disease. Low lung volumes. IMPRESSION: Low lung volumes with mild left costophrenic angle scarring versus atelectasis. Electronically Signed   By: Ilona Sorrel M.D.   On: 08/12/2018 10:24   Dg Chest Port 1 View  Result Date: 08/12/2018 CLINICAL DATA:  Worsening shortness of breath EXAM: PORTABLE CHEST 1 VIEW COMPARISON:  08/11/2018, 08/10/2018 FINDINGS: Low lung volumes. No acute opacity or pleural effusion. Normal heart size. No pneumothorax. Old right-sided rib fractures. IMPRESSION: No active disease. Electronically Signed   By: Donavan Foil M.D.   On: 08/12/2018 02:39   Dg Chest Port 1 View  Result Date: 08/11/2018 CLINICAL DATA:  Altered mental status EXAM: PORTABLE CHEST 1 VIEW COMPARISON:  08/10/2018 FINDINGS: Low lung volumes. Cardiomegaly with mild central congestion. No focal airspace disease or effusion. No pneumothorax. IMPRESSION: Cardiomegaly with mild  central congestion. Low lung volume without focal airspace disease. Electronically Signed   By: Donavan Foil M.D.   On: 08/11/2018 23:12        Scheduled Meds:  aspirin EC  81 mg Oral Daily   chlorhexidine  15 mL Mouth Rinse BID   enoxaparin (LOVENOX) injection  40 mg Subcutaneous Q24H   insulin aspart  0-5 Units Subcutaneous QHS   insulin aspart  0-9 Units Subcutaneous TID WC   mouth rinse  15 mL Mouth Rinse q12n4p   metoprolol tartrate  25 mg Oral Daily   minocycline  100 mg Oral BID   mirtazapine  30 mg Oral QHS   naloxone       risperiDONE  2 mg Oral Daily   risperiDONE  4 mg Oral QHS   sodium chloride flush  3 mL Intravenous Q12H   Continuous Infusions:  sodium chloride       LOS: 0 days    Time spent: 39 minutes   Critical Care Time Upon my evaluation, this patient had a high probability of imminent or life-threatening deterioration due to acute hypoxic respiratory failure with hypercapnia secondary to drug overdose in which patient was obtunded/unresponsive, which required my direct attention, intervention, and personal management.  I have personally provided 39 minutes of critical care time exclusive of my time spent on separately billable procedures.  Time includes review of laboratory data, radiology results, discussion with consultants, and monitoring for potential decompensation.      Kailin Leu J [place] DO [facility] Pager [phone]  If 7PM-7AM, please contact night-coverage [email or web address] Password [email or web address] 08/12/2018, 1:53 PM

## 2018-08-12 NOTE — ED Notes (Signed)
Pt in ED and going to 4E11.  Had to be discharged out and put back in due to previous bed assigned at [facility].  See previous chart for all ED notes.

## 2018-08-12 NOTE — ED Notes (Signed)
RT at bedside applying bi-pap 

## 2018-08-12 NOTE — Plan of Care (Signed)
  Problem: Clinical Measurements: Goal: Ability to maintain clinical measurements within normal limits will improve Outcome: Progressing   Problem: Clinical Measurements: Goal: Respiratory complications will improve Outcome: Progressing   

## 2018-08-12 NOTE — ED Notes (Signed)
RN attempted to call report to 5W, no answer.

## 2018-08-12 NOTE — ED Notes (Signed)
Patient transported to MRI 

## 2018-08-12 NOTE — ED Notes (Signed)
As pt began to sleep his O2 saturation began to drop. RN placed pt on 4L [place]. O2 saturation normalized.

## 2018-08-12 NOTE — Progress Notes (Signed)
Dr. [place] notified about patient being unresponsive on bipap. He came to see patient and is putting in some new orders.

## 2018-08-12 NOTE — Progress Notes (Signed)
RN called RT because patient ripped his BiPAP off, did not want it back on, placed patient on 3L[place], SATS 93%, will continue to monitor patient.

## 2018-08-12 NOTE — ED Notes (Signed)
RN attempted to call report to 2W. 2W said that they had to get in contact with bed placement.

## 2018-08-12 NOTE — Progress Notes (Signed)
All orders cancelled due to pt not being processed in ED appropriately.  Orders reentered.  See prev note and charge.

## 2018-08-12 NOTE — ED Notes (Signed)
D/t pt being asleep and breathing through mouth [place] is not effective in keeping O2 saturation WDL. RN place pt on NRB to maintain O2 WDL.

## 2018-08-12 NOTE — Consult Note (Signed)
Attempt to assess patient via Tele health unsuccessful. Patient is alert but confused, incoherent and unable to participate in psychiatric evaluation. Re-consult Psychiatric service when patient is alert, coherent and able to participate in oral psychiatric assessment.  Corena Pilgrim, MD Attending psychiatrist.

## 2018-08-12 NOTE — H&P (Signed)
History and Physical    [name] [name]. IWL:[id number] DOB: 1965-04-11 DOA: 08/11/2018  PCP: Ann Held, DO  Patient coming from: Behavioral health  Chief Complaint: Delirium and confusion  HPI: [name] [name]. is a 53 y.o. male with medical history significant of paranoid schizophrenia, hypertension, diabetes was discharged yesterday after medical evaluation for confusion and delirium was discharged to behavioral health for psychosis in the setting of paranoid schizophrenia arrived at behavior health was found to be confused and sedated in then right away turned around and sent back to the ED.  It appears after chart review that patient's been getting sedation throughout the last 40 hours or so and he becomes hypoxic however whenever he is not sedated he becomes extremely agitated.  Suspicion is that he has underlying untreated obstructive sleep apnea.  He does not have prior home oxygen requirements.  He was diagnosed with a mild left lower extremity cellulitis for which she is on Midrin for.  This does not appear to be worse than yesterday.  Patient was given Haldol on arrival to the emergency department because he was very combative.  He is also found to be in hypercapnic and hypoxic respiratory failure likely secondary to a component of sedation.  Currently on BiPAP and improving.  Patient be referred for admission for acute on chronic respiratory failure with hypoxia and hypercapnia.  He has no known history of COPD.  He just got back from MRI to rule out other etiology has a normal CT head from yesterday.  He also has a negative COVID from yesterday.  Review of Systems: Unobtainable secondary to altered mental status  Past Medical History:  Diagnosis Date   Diabetes mellitus type II, uncontrolled ([place])    HTN (hypertension)    Hyperlipidemia LDL goal <70    Schizophrenia ([place])     No past surgical history on file.  Unknown   reports that he has been smoking  cigarettes and cigars. He has a 37.50 pack-year smoking history. He has never used smokeless tobacco. He reports current alcohol use. He reports current drug use. Drug: Marijuana.  No Known Allergies  Family History  Problem Relation Age of Onset   Breast cancer Mother    Diabetes Mother    Prostate cancer Father    Hypertension Father    Breast cancer Maternal Aunt    Diabetes Maternal Uncle     Prior to Admission medications   Medication Sig Start Date End Date Taking? Authorizing Provider  ACCU-CHEK AVIVA PLUS test strip TEST 4 X DAILY 05/02/18   [provider]  ACCU-CHEK FASTCLIX LANCETS MISC CHECK BLOOD SUGAR 4X DAILY 05/02/18   [provider]  aspirin EC 81 MG tablet Take 1 tablet (81 mg total) by mouth daily. For heart health 12/02/17   Lindell Spar I, NP  blood glucose meter kit and supplies KIT Dispense based on patient and insurance preference. Use up to four times daily as directed. (FOR ICD-9 250.00, 250.01). 04/06/18   Carollee Herter, Kendrick Fries R, DO  fenofibrate 160 MG tablet TAKE 1 TABLET BY MOUTH EVERY DAY Patient taking differently: Take 160 mg by mouth daily.  07/21/18   Roma Schanz R, DO  glyBURIDE (DIABETA) 5 MG tablet Take 1 tablet (5 mg total) by mouth daily with breakfast for 30 days. 08/11/18 09/10/18  Spongberg, Audie Pinto, MD  Insulin Pen Needle (PEN NEEDLES) 32G X 6 MM MISC Inject 1 each into the skin daily. 05/04/18   Lowne  Chase, Yvonne R, DO  LANTUS SOLOSTAR 100 UNIT/ML Solostar Pen Inject 60 Units into the skin at bedtime.  05/07/18   [provider]  metoprolol tartrate (LOPRESSOR) 25 MG tablet TAKE 1 TABLET BY MOUTH TWICE A DAY Patient taking differently: Take 25 mg by mouth daily.  04/19/18   Ann Held, DO  minocycline (MINOCIN) 100 MG capsule Take 1 capsule (100 mg total) by mouth 2 (two) times daily for 7 days. 08/11/18 08/18/18  Marcell Anger, MD  mirtazapine (REMERON) 30 MG tablet Take 1 tablet (30 mg  total) by mouth at bedtime. 06/08/18 06/08/19  Arfeen, Arlyce Harman, MD  multivitamin-iron-minerals-folic acid (CENTRUM) chewable tablet Chew 1 tablet by mouth daily. 02/02/18   Lowne Chase, Alferd Apa, DO  OVER THE COUNTER MEDICATION CBD OIL  VAPE OIL    [provider]  risperiDONE (RISPERDAL) 2 MG tablet Take 1 tablet (2 mg) qam, take 2 tablets (4 mg) qhs Patient taking differently: Take 2 mg by mouth See admin instructions. Take t (2 mg) in he morning  (4 mg) in the evening 06/08/18   Arfeen, Arlyce Harman, MD  simvastatin (ZOCOR) 40 MG tablet TAKE 1 TABLET BY MOUTH EVERY DAY Patient taking differently: Take 40 mg by mouth daily at 6 PM.  04/24/18   Carollee Herter, Alferd Apa, DO  traZODone (DESYREL) 100 MG tablet Take 1 tablet (100 mg total) by mouth at bedtime as needed for sleep. 06/08/18   Arfeen, Arlyce Harman, MD  Vitamin D, Ergocalciferol, (DRISDOL) 50000 units CAPS capsule Take 1 capsule (50,000 Units total) by mouth once a week. For bone health Patient taking differently: Take 50,000 Units by mouth once a week. Monday 12/05/17   Lindell Spar I, NP    Physical Exam: Vitals:   08/12/18 0000 08/12/18 0045 08/12/18 0145 08/12/18 0152  BP: 122/67 124/66 (!) 159/78   Pulse: (!) 101 96 (!) 123 (!) 117  Resp: (!) 21 (!) 25  (!) 23  Temp:      TempSrc:      SpO2: 91% 96% 100% 100%  Weight:      Height:          Constitutional: NAD, calm, comfortable on BiPAP Vitals:   08/12/18 0000 08/12/18 0045 08/12/18 0145 08/12/18 0152  BP: 122/67 124/66 (!) 159/78   Pulse: (!) 101 96 (!) 123 (!) 117  Resp: (!) 21 (!) 25  (!) 23  Temp:      TempSrc:      SpO2: 91% 96% 100% 100%  Weight:      Height:       Eyes: PERRL, lids and conjunctivae normal ENMT: Mucous membranes are moist. Posterior pharynx clear of any exudate or lesions.Normal dentition.  Neck: normal, supple, no masses, no thyromegaly Respiratory: clear to auscultation bilaterally, no wheezing, no crackles. Normal respiratory effort. No accessory  muscle use.  Cardiovascular: Regular rate and rhythm, no murmurs / rubs / gallops. No extremity edema. 2+ pedal pulses. No carotid bruits.  Abdomen: no tenderness, no masses palpated. No hepatosplenomegaly. Bowel sounds positive.  Musculoskeletal: no clubbing / cyanosis. No joint deformity upper and lower extremities. Good ROM, no contractures. Normal muscle tone.  Skin: no rashes, lesions, ulcers. No induration except left lower anterior shin with some mild cellulitis Neurologic: CN 2-12 grossly intact. Sensation intact, DTR normal. Strength 5/5 in all 4.  Spontaneously moving all extremities cannot participate in simple commands but no focal neurological deficits noted Psychiatric: Not normal judgment and insight.  Alert and oriented x 1.  Currently normal mood and calm but frequently gets very agitated in the ED   Labs on Admission: I have personally reviewed following labs and imaging studies  CBC: Recent Labs  Lab 08/10/18 0744 08/11/18 0233 08/11/18 2054 08/11/18 2222  WBC 14.5* 10.3 10.1  --   NEUTROABS 11.8*  --  7.5  --   HGB 13.3 12.5* 13.1 13.3  HCT 41.2 38.6* 41.4 39.0  MCV 88.2 89.1 90.8  --   PLT 503* 475* 477*  --    Basic Metabolic Panel: Recent Labs  Lab 08/10/18 0744 08/10/18 1235 08/10/18 1335 08/11/18 0233 08/11/18 2054 08/11/18 2222  NA 133*  --  136 137 138 141  K 3.8  --  3.7 3.9 4.0 4.0  CL 97*  --  103 105 105  --   CO2 23  --  22 23 21*  --   GLUCOSE 205*  --  119* 98 98  --   BUN 16  --  12 14 18   --   CREATININE 2.36* 2.20* 2.11* 1.98* 2.11*  --   CALCIUM 9.4  --  9.1 8.9 9.2  --    GFR: Estimated Creatinine Clearance: 53.6 mL/min (A) (by C-G formula based on SCr of 2.11 mg/dL (H)). Liver Function Tests: Recent Labs  Lab 08/11/18 2054  AST 26  ALT 21  ALKPHOS 35*  BILITOT 0.6  PROT 6.7  ALBUMIN 3.5   No results for input(s): LIPASE, AMYLASE in the last 168 hours. Recent Labs  Lab 08/11/18 2054  AMMONIA 35   Coagulation  Profile: No results for input(s): INR, PROTIME in the last 168 hours. Cardiac Enzymes: Recent Labs  Lab 08/11/18 2054  CKTOTAL 328  TROPONINI <0.03   BNP (last 3 results) No results for input(s): PROBNP in the last 8760 hours. HbA1C: No results for input(s): HGBA1C in the last 72 hours. CBG: Recent Labs  Lab 08/10/18 1741 08/10/18 2118 08/11/18 0815 08/11/18 1138 08/11/18 1555  GLUCAP 114* 119* 86 106* 108*   Lipid Profile: No results for input(s): CHOL, HDL, LDLCALC, TRIG, CHOLHDL, LDLDIRECT in the last 72 hours. Thyroid Function Tests: No results for input(s): TSH, T4TOTAL, FREET4, T3FREE, THYROIDAB in the last 72 hours. Anemia Panel: No results for input(s): VITAMINB12, FOLATE, FERRITIN, TIBC, IRON, RETICCTPCT in the last 72 hours. Urine analysis:    Component Value Date/Time   COLORURINE STRAW (A) 08/10/2018 1127   APPEARANCEUR CLEAR 08/10/2018 1127   LABSPEC 1.004 (L) 08/10/2018 1127   PHURINE 7.0 08/10/2018 1127   GLUCOSEU NEGATIVE 08/10/2018 1127   GLUCOSEU >=1000 (A) 01/19/2016 1429   HGBUR SMALL (A) 08/10/2018 1127   BILIRUBINUR NEGATIVE 08/10/2018 1127   BILIRUBINUR neg 01/24/2017 1533   KETONESUR NEGATIVE 08/10/2018 1127   PROTEINUR 30 (A) 08/10/2018 1127   UROBILINOGEN 0.2 01/24/2017 1533   UROBILINOGEN 0.2 01/19/2016 1429   NITRITE NEGATIVE 08/10/2018 1127   LEUKOCYTESUR NEGATIVE 08/10/2018 1127   Sepsis Labs: !!!!!!!!!!!!!!!!!!!!!!!!!!!!!!!!!!!!!!!!!!!! @LABRCNTIP (procalcitonin:4,lacticidven:4) ) Recent Results (from the past 240 hour(s))  SARS Coronavirus 2 (CEPHEID - Performed in [facility] hospital lab), Hosp Order     Status: None   Collection Time: 08/10/18  8:51 AM  Result Value Ref Range Status   SARS Coronavirus 2 NEGATIVE NEGATIVE Final    Comment: (NOTE) If result is NEGATIVE SARS-CoV-2 target nucleic acids are NOT DETECTED. The SARS-CoV-2 RNA is generally detectable in upper and lower  respiratory specimens during the acute phase  of infection. The  lowest  concentration of SARS-CoV-2 viral copies this assay can detect is 250  copies / mL. A negative result does not preclude SARS-CoV-2 infection  and should not be used as the sole basis for treatment or other  patient management decisions.  A negative result may occur with  improper specimen collection / handling, submission of specimen other  than nasopharyngeal swab, presence of viral mutation(s) within the  areas targeted by this assay, and inadequate number of viral copies  (<250 copies / mL). A negative result must be combined with clinical  observations, patient history, and epidemiological information. If result is POSITIVE SARS-CoV-2 target nucleic acids are DETECTED. The SARS-CoV-2 RNA is generally detectable in upper and lower  respiratory specimens dur ing the acute phase of infection.  Positive  results are indicative of active infection with SARS-CoV-2.  Clinical  correlation with patient history and other diagnostic information is  necessary to determine patient infection status.  Positive results do  not rule out bacterial infection or co-infection with other viruses. If result is PRESUMPTIVE POSTIVE SARS-CoV-2 nucleic acids MAY BE PRESENT.   A presumptive positive result was obtained on the submitted specimen  and confirmed on repeat testing.  While 2019 novel coronavirus  (SARS-CoV-2) nucleic acids may be present in the submitted sample  additional confirmatory testing may be necessary for epidemiological  and / or clinical management purposes  to differentiate between  SARS-CoV-2 and other Sarbecovirus currently known to infect humans.  If clinically indicated additional testing with an alternate test  methodology [phone]) is advised. The SARS-CoV-2 RNA is generally  detectable in upper and lower respiratory sp ecimens during the acute  phase of infection. The expected result is Negative. Fact Sheet for Patients:   StrictlyIdeas.no Fact Sheet for Healthcare Providers: BankingDealers.co.za This test is not yet approved or cleared by the [place] FDA and has been authorized for detection and/or diagnosis of SARS-CoV-2 by FDA under an Emergency Use Authorization (EUA).  This EUA will remain in effect (meaning this test can be used) for the duration of the COVID-19 declaration under Section 564(b)(1) of the Act, 21 U.S.C. section 360bbb-3(b)(1), unless the authorization is terminated or revoked sooner. Performed at [facility], [place] [address] [place], [place] [address]      Radiological Exams on Admission: Dg Chest 1 View  Result Date: 08/10/2018 CLINICAL DATA:  Hypoxia EXAM: CHEST  1 VIEW COMPARISON:  01/13/2018 FINDINGS: The cardiac silhouette is mildly enlarged. There are prominent interstitial lung markings bilaterally. There are old healed right-sided rib fractures. There is no pneumothorax. No large pleural effusion. IMPRESSION: Borderline enlarged heart with mild prominence of the interstitial lung markings suggestive of volume overload. Electronically Signed   By: Constance Holster M.D.   On: 08/10/2018 20:28   Ct Head Wo Contrast  Result Date: 08/10/2018 CLINICAL DATA:  Altered mental status. Insomnia. History of schizophrenia EXAM: CT HEAD WITHOUT CONTRAST TECHNIQUE: Contiguous axial images were obtained from the base of the skull through the vertex without intravenous contrast. COMPARISON:  None. FINDINGS: Brain: There is mild diffuse atrophy. There is no intracranial mass, hemorrhage, extra-axial fluid collection, or midline shift. Brain parenchyma appears unremarkable. No evident acute infarct. Vascular: There is no appreciable hyperdense vessel. There is no appreciable vascular calcification. Skull: The bony calvarium appears intact. Sinuses/Orbits: There is mucosal thickening in several ethmoid air cells. Other visualized paranasal  sinuses are clear. Frontal sinuses are hypoplastic. Orbits appear symmetric bilaterally. Other: Mastoid air cells are clear. There  is debris in each external auditory canal. IMPRESSION: Mild diffuse atrophy. The brain parenchyma appears unremarkable. No acute infarct. No mass or hemorrhage. Mucosal thickening noted in several ethmoid air cells. There is probable cerumen in the external auditory canals. Electronically Signed   By: Lowella Grip III M.D.   On: 08/10/2018 08:29   Mr Brain Wo Contrast  Result Date: 08/12/2018 CLINICAL DATA:  Initial evaluation for acute altered mental status, confusion. EXAM: MRI HEAD WITHOUT CONTRAST TECHNIQUE: Multiplanar, multiecho pulse sequences of the brain and surrounding structures were obtained without intravenous contrast. COMPARISON:  Prior CT from 08/10/2018 FINDINGS: Brain: Examination markedly limited as the patient was unable to tolerate the full length of the study. Axial diffusion-weighted imaging only was performed. Additionally, images provided are moderately degraded by motion artifact. DWI sequence demonstrates apparent somewhat linear focus of increased diffusion overlying the anterior left temporal lobe, adjacent to the left temporal horn (series 9, image 65). This measures approximately 2 cm in length. Unclear whether this reflects artifact or possibly acute ischemia. No other diffusion abnormality to suggest acute or subacute ischemia. Gray-white matter differentiation otherwise grossly maintained. No discernible mass lesion, mass effect, or midline shift. No hydrocephalus. No extra-axial fluid collection. Vascular: Not well assessed on this limited examination. Skull and upper cervical spine: Not well assessed on this limited examination. Sinuses/Orbits: Not well assessed on this limited examination. Other: None. IMPRESSION: 1. Technically limited exam due to patient's inability to tolerate the full length of the exam and motion artifact. Axial DWI  imaging only was performed. 2. Apparent 2 cm focus of linear diffusion abnormality involving the left temporal lobe as above. While this finding may be artifactual nature, sequelae of acute ischemia difficult to exclude, and could be considered in the correct clinical setting. If clinical picture is equivocal for possible ischemia, a repeat study could be performed for further evaluation as the patient is able to tolerate. Electronically Signed   By: Jeannine Boga M.D.   On: 08/12/2018 02:00   Dg Chest Port 1 View  Result Date: 08/11/2018 CLINICAL DATA:  Altered mental status EXAM: PORTABLE CHEST 1 VIEW COMPARISON:  08/10/2018 FINDINGS: Low lung volumes. Cardiomegaly with mild central congestion. No focal airspace disease or effusion. No pneumothorax. IMPRESSION: Cardiomegaly with mild central congestion. Low lung volume without focal airspace disease. Electronically Signed   By: Donavan Foil M.D.   On: 08/11/2018 23:12   Old chart reviewed Case discussed with EDP Chest x-ray with some mild congestion poor quality  Assessment/Plan 53 year old male with likely primary issue of psychosis secondary to schizophrenia who is becoming hypoxic and hypercapnic secondary to sedation in the setting of likely untreated obstructive sleep apnea  Principal Problem:   Acute respiratory failure with hypoxia and hypercapnia (HCC)-patient is placed on BiPAP.  Will repeat ABG in a couple of hours.  Supportive care.  We will try to minimize sedatives but I think this will be difficult in this patient.  Will decrease Haldol from 5 mg to 2 mg as previously given earlier in the ED.  We will also give small doses of Ativan if needed.  Will need to call psychiatry to help with his Agitation while in the hospital.   Active Problems:   Acute metabolic encephalopathy-secondary to the above MRI very poor quality    HTN (hypertension)-resume home meds once clarified    Type II diabetes mellitus with renal  manifestations (HCC)-clarify home meds and resume    Schizophrenia (HCC)-psychiatry aware sent from behavioral health  today.  Ensure that they follow him while in the hospital tomorrow.  Left lower extremity cellulitis-continue antibiotics.  Lactic acid level normal.       DVT prophylaxis: SCDs Code Status: Full Family Communication: None Disposition Plan: Days Consults called: None Admission status: Admission  Critical care time 45 minutes  Tejal Monroy A MD [facility]  If 7PM-7AM, please contact night-coverage [email or web address] Password [email or web address]  08/12/2018, 2:13 AM

## 2018-08-12 NOTE — ED Notes (Signed)
RN attempted to call report. RN was asked to call back

## 2018-08-13 DIAGNOSIS — F201 Disorganized schizophrenia: Secondary | ICD-10-CM

## 2018-08-13 DIAGNOSIS — E1151 Type 2 diabetes mellitus with diabetic peripheral angiopathy without gangrene: Secondary | ICD-10-CM

## 2018-08-13 DIAGNOSIS — N289 Disorder of kidney and ureter, unspecified: Secondary | ICD-10-CM

## 2018-08-13 DIAGNOSIS — F23 Brief psychotic disorder: Secondary | ICD-10-CM

## 2018-08-13 DIAGNOSIS — F250 Schizoaffective disorder, bipolar type: Secondary | ICD-10-CM

## 2018-08-13 DIAGNOSIS — E1165 Type 2 diabetes mellitus with hyperglycemia: Secondary | ICD-10-CM

## 2018-08-13 DIAGNOSIS — I10 Essential (primary) hypertension: Secondary | ICD-10-CM

## 2018-08-13 LAB — CBC
HCT: 40.2 % (ref 39.0–52.0)
Hemoglobin: 13 g/dL (ref 13.0–17.0)
MCH: 29 pg (ref 26.0–34.0)
MCHC: 32.3 g/dL (ref 30.0–36.0)
MCV: 89.5 fL (ref 80.0–100.0)
Platelets: 459 10*3/uL — ABNORMAL HIGH (ref 150–400)
RBC: 4.49 MIL/uL (ref 4.22–5.81)
RDW: 13.8 % (ref 11.5–15.5)
WBC: 9.7 10*3/uL (ref 4.0–10.5)
nRBC: 0 % (ref 0.0–0.2)

## 2018-08-13 LAB — GLUCOSE, CAPILLARY
Glucose-Capillary: 110 mg/dL — ABNORMAL HIGH (ref 70–99)
Glucose-Capillary: 137 mg/dL — ABNORMAL HIGH (ref 70–99)
Glucose-Capillary: 106 mg/dL — ABNORMAL HIGH (ref 70–99)
Glucose-Capillary: 100 mg/dL — ABNORMAL HIGH (ref 70–99)
Glucose-Capillary: 97 mg/dL (ref 70–99)
Glucose-Capillary: 112 mg/dL — ABNORMAL HIGH (ref 70–99)

## 2018-08-13 LAB — BASIC METABOLIC PANEL
Anion gap: 13 (ref 5–15)
BUN: 17 mg/dL (ref 6–20)
CO2: 21 mmol/L — ABNORMAL LOW (ref 22–32)
Calcium: 8.9 mg/dL (ref 8.9–10.3)
Chloride: 99 mmol/L (ref 98–111)
Creatinine, Ser: 1.79 mg/dL — ABNORMAL HIGH (ref 0.61–1.24)
GFR calc Af Amer: 50 mL/min — ABNORMAL LOW (ref >60)
GFR calc non Af Amer: 43 mL/min — ABNORMAL LOW (ref >60)
Glucose, Bld: 116 mg/dL — ABNORMAL HIGH (ref 70–99)
Potassium: 3.9 mmol/L (ref 3.5–5.1)
Sodium: 133 mmol/L — ABNORMAL LOW (ref 135–145)

## 2018-08-13 LAB — MAGNESIUM: Magnesium: 1.9 mg/dL (ref 1.7–2.4)

## 2018-08-13 NOTE — Progress Notes (Signed)
Patient resting comfortably on room air. No respiratory distress noted. BIPAP not needed at this time. RT will monitor as needed. ?

## 2018-08-13 NOTE — Care Management (Signed)
Notified Mohammed CSW of consult for transfer to [facility],

## 2018-08-13 NOTE — Progress Notes (Signed)
PROGRESS NOTE    [name].  EVO:[id number] DOB: April 09, 1965 DOA: 08/12/2018 PCP: Ann Held, DO    Brief Narrative:   Gearld Kerstein. is a 53 y.o. male with medical history significant of DM; HTN; HLD; and schizophrenia presenting with  Psychosis. Caretaker reported that he had not slept since Sunday.  Patient was found to have a gun that he was pointing around in the lobby and also a knife, and [employer] took him into custody.  The patient was unable to provide history at the time of admission.  He was given sedatives prior to my evaluation and was obtunded.  Psych eval - increasingly agitated per caregiver.  IVC done after gun and knife in waiting room.  AKI - given bolus.  Will recheck BMP and if normalizing can go to inpatient [facility] today.  If not normalizing, can observe overnight for IV hydration.  Patient was discharged to behavioral health hospital on 08/11/2018 for further psychiatric care.  Apparently when patient arrived he was agitated and given Dilaudid and Ativan per nursing staff.  He became unresponsive and was transferred back to [facility] due to his current mental status.  Assessment & Plan:   Active Problems:   HTN (hypertension)   Renal insufficiency   Schizophrenia, disorganized type ([place])   DM (diabetes mellitus) type II uncontrolled, periph vascular disorder (HCC)   Schizoaffective disorder, bipolar type ([place])   Acute respiratory failure with hypercapnia (HCC)  Acute respiratory failure with hypoxia and hypercapnia: Resolved Acute metabolic versus toxic encephalopathy: Resolved Patient representing back to [facility] following recent discharge earlier in the evening to behavioral health hospital for further evaluation and treatment of his underlying psychosis secondary to schizophrenia.  Apparently, per nursing staff when patient was at the behavioral health hospital he was agitated and given sedatives to include Dilaudid and Ativan.   Following administration of these medications, he became unresponsive and subsequently was transferred back to [facility].  On arrival to patient's room this morning, patient was noted to be obtunded on BiPAP.  No response to sternal rub and noted pinpoint pupils, likely secondary to drug overdose with benzodiazepines and narcotics.  Patient was given flumazenil 0.2 mg IV and Narcan 0.2 mg IV with mild improvement of his mentation.  This was followed by an additional dose of flumazenil 0.3 mg IV and Narcan 0.2 mg IV with resolution of his encephalopathy. --Titrated off of supplemental oxygen, doing well on room air --Avoid narcotics and benzos in this patient --Supportive care --Continue one-to-one sitter  Schizophrenia with acute psychosis Patient initially presenting after being found with a gun and knife and subdued by [employer]  Per his roommate, he has not been sleeping recently and likely noncompliant with his home medications.  He has been deemed a danger to himself and others and has been recommended for inpatient behavioral health treatment.  Currently on involuntary commitment. --Reconsulted psychiatry on 5/30, Dr. Darleene Cleaver; per his evaluation via telehealth, he was unable to participate in psychiatric evaluation today due to confusion and noted incoherence; but this is likely due to his underlying poorly controlled schizophrenia in which psychiatry should be actively managing. --Continue involuntary commitment, 1:1 safety sitter --continue Risperdal 2 mg every morning and 4 mg every afternoon. --Trazodone 100 mg nightly --Haldol 2 mg IV every 6 hours as needed for agitation, has received 3 doses in the past 24 hours, hopefully will be less now he is able to take his Risperdal. --Await for  psychiatric reevaluation for transfer to [place] hypertension BP controlled, 138/91 this morning --Continue metoprolol tartrate 25 mg p.o. daily  Type 2 diabetes  mellitus Last hemoglobin A1c 6.6 on 04/06/2018.  Was previously on metformin but discontinued due to renal insufficiency; in favor of glyburide. --Hold oral hypoglycemics while inpatient --Consistent carbohydrate diet --Insulin sliding scale for coverage --CBG's qAC/HS  Cellulitis left lower extremity --Continue minocycline x7 days  Acute on CKD stage III: resolved Baseline creatinine between 1.6 - 2.0 over the past year.  Initially admitted with a creatinine of 2.36, likely secondary to prerenal failure secondary to dehydration and continuation of his home metformin. --Cr down to 1.79 today --Metformin discontinued --Avoid ACE inhibitors and NSAIDs --Monitor BMP daily   DVT prophylaxis: Lovenox Code Status: Full code Family Communication: None Disposition Plan: Plan discharge back to behavioral health hospital, awaiting psychiatry re-eval   Consultants:   Psychiatry, Dr. Darleene Cleaver  Procedures:   none  Antimicrobials:   none   Subjective: Patient seen and examined at bedside, sitting on bedside eating breakfast, safety sitter present.  Continues with confabulations and incoherent speech.  Currently tolerating his medications and diet.  Resolution from his acute encephalopathy due to medication overdose with benzos and narcotics yesterday.  Titrated off of supplemental oxygen.  Required 3 doses of 2 mg IV Haldol past 24 hours.  Awaiting further psychiatric evaluation for transition back to the behavioral health hospital.   Objective: Vitals:   08/12/18 1553 08/12/18 2117 08/13/18 0421 08/13/18 0726  BP:  118/76 (!) 138/91   Pulse:  90 (!) 105   Resp:  20 20   Temp: 98.3 F (36.8 C) 98.3 F (36.8 C) 98.5 F (36.9 C)   TempSrc: Oral Oral Oral   SpO2:  95% 95%   Weight:    117.8 kg    Intake/Output Summary (Last 24 hours) at 08/13/2018 1309 Last data filed at 08/13/2018 1223 Gross per 24 hour  Intake 2243 ml  Output 9 ml  Net 2234 ml   Filed Weights   08/12/18  0657 08/13/18 0726  Weight: 117 kg 117.8 kg    Examination:  General exam: Alert, confabulating speech, no acute distress Respiratory system: CTAB, no wheezing/crackles/rales, normal respiratory effort, on room air Cardiovascular system: S1 & S2 heard, RRR. No JVD, murmurs, rubs, gallops or clicks. No pedal edema. Gastrointestinal system: Abdomen is nondistended, protuberant, soft and nontender. No organomegaly or masses felt. Normal bowel sounds heard. Central nervous system: Alert, no focal neurologic abnormality Extremities: No pedal edema Skin: Left lower extremity with mild erythema no weeping no purulence no lesions Psychiatry: Confabulating speech    Data Reviewed: I have personally reviewed following labs and imaging studies  CBC: Recent Labs  Lab 08/10/18 0744 08/11/18 0233 08/11/18 2054 08/11/18 2222 08/12/18 0433 08/12/18 0440 08/13/18 0306  WBC 14.5* 10.3 10.1  --  12.3*  --  9.7  NEUTROABS 11.8*  --  7.5  --   --   --   --   HGB 13.3 12.5* 13.1 13.3 14.1 14.3 13.0  HCT 41.2 38.6* 41.4 39.0 44.6 42.0 40.2  MCV 88.2 89.1 90.8  --  92.3  --  89.5  PLT 503* 475* 477*  --  438*  --  960*   Basic Metabolic Panel: Recent Labs  Lab 08/10/18 1335 08/11/18 0233 08/11/18 2054 08/11/18 2222 08/12/18 0433 08/12/18 0440 08/13/18 0306  NA 136 137 138 141 140 140 133*  K 3.7 3.9 4.0 4.0 4.6 4.5  3.9  CL 103 105 105  --  103  --  99  CO2 22 23 21*  --  24  --  21*  GLUCOSE 119* 98 98  --  92  --  116*  BUN 12 14 18   --  17  --  17  CREATININE 2.11* 1.98* 2.11*  --  2.07*  --  1.79*  CALCIUM 9.1 8.9 9.2  --  9.1  --  8.9  MG  --   --   --   --   --   --  1.9   GFR: Estimated Creatinine Clearance: 60.6 mL/min (A) (by C-G formula based on SCr of 1.79 mg/dL (H)). Liver Function Tests: Recent Labs  Lab 08/11/18 2054 08/12/18 0433  AST 26 27  ALT 21 21  ALKPHOS 35* 42  BILITOT 0.6 0.3  PROT 6.7 6.9  ALBUMIN 3.5 3.5   No results for input(s): LIPASE, AMYLASE  in the last 168 hours. Recent Labs  Lab 08/11/18 2054  AMMONIA 35   Coagulation Profile: No results for input(s): INR, PROTIME in the last 168 hours. Cardiac Enzymes: Recent Labs  Lab 08/11/18 2054  CKTOTAL 328  TROPONINI <0.03   BNP (last 3 results) No results for input(s): PROBNP in the last 8760 hours. HbA1C: No results for input(s): HGBA1C in the last 72 hours. CBG: Recent Labs  Lab 08/12/18 1220 08/12/18 1732 08/12/18 2117 08/13/18 0421 08/13/18 0900  GLUCAP 165* 116* 127* 110* 137*   Lipid Profile: No results for input(s): CHOL, HDL, LDLCALC, TRIG, CHOLHDL, LDLDIRECT in the last 72 hours. Thyroid Function Tests: No results for input(s): TSH, T4TOTAL, FREET4, T3FREE, THYROIDAB in the last 72 hours. Anemia Panel: No results for input(s): VITAMINB12, FOLATE, FERRITIN, TIBC, IRON, RETICCTPCT in the last 72 hours. Sepsis Labs: Recent Labs  Lab 08/11/18 2318  LATICACIDVEN 0.4*    Recent Results (from the past 240 hour(s))  SARS Coronavirus 2 (CEPHEID - Performed in [place] hospital lab), Hosp Order     Status: None   Collection Time: 08/10/18  8:51 AM  Result Value Ref Range Status   SARS Coronavirus 2 NEGATIVE NEGATIVE Final    Comment: (NOTE) If result is NEGATIVE SARS-CoV-2 target nucleic acids are NOT DETECTED. The SARS-CoV-2 RNA is generally detectable in upper and lower  respiratory specimens during the acute phase of infection. The lowest  concentration of SARS-CoV-2 viral copies this assay can detect is 250  copies / mL. A negative result does not preclude SARS-CoV-2 infection  and should not be used as the sole basis for treatment or other  patient management decisions.  A negative result may occur with  improper specimen collection / handling, submission of specimen other  than nasopharyngeal swab, presence of viral mutation(s) within the  areas targeted by this assay, and inadequate number of viral copies  (<250 copies / mL). A negative result  must be combined with clinical  observations, patient history, and epidemiological information. If result is POSITIVE SARS-CoV-2 target nucleic acids are DETECTED. The SARS-CoV-2 RNA is generally detectable in upper and lower  respiratory specimens dur ing the acute phase of infection.  Positive  results are indicative of active infection with SARS-CoV-2.  Clinical  correlation with patient history and other diagnostic information is  necessary to determine patient infection status.  Positive results do  not rule out bacterial infection or co-infection with other viruses. If result is PRESUMPTIVE POSTIVE SARS-CoV-2 nucleic acids MAY BE PRESENT.   A presumptive  positive result was obtained on the submitted specimen  and confirmed on repeat testing.  While 2019 novel coronavirus  (SARS-CoV-2) nucleic acids may be present in the submitted sample  additional confirmatory testing may be necessary for epidemiological  and / or clinical management purposes  to differentiate between  SARS-CoV-2 and other Sarbecovirus currently known to infect humans.  If clinically indicated additional testing with an alternate test  methodology [phone]) is advised. The SARS-CoV-2 RNA is generally  detectable in upper and lower respiratory sp ecimens during the acute  phase of infection. The expected result is Negative. Fact Sheet for Patients:  StrictlyIdeas.no Fact Sheet for Healthcare Providers: BankingDealers.co.za This test is not yet approved or cleared by the [place] FDA and has been authorized for detection and/or diagnosis of SARS-CoV-2 by FDA under an Emergency Use Authorization (EUA).  This EUA will remain in effect (meaning this test can be used) for the duration of the COVID-19 declaration under Section 564(b)(1) of the Act, 21 U.S.C. section 360bbb-3(b)(1), unless the authorization is terminated or revoked sooner. Performed at [facility], [place] [address] [place], [place] [address]          Radiology Studies: Mr Brain 87 Contrast  Result Date: 08/12/2018 CLINICAL DATA:  Initial evaluation for acute altered mental status, confusion. EXAM: MRI HEAD WITHOUT CONTRAST TECHNIQUE: Multiplanar, multiecho pulse sequences of the brain and surrounding structures were obtained without intravenous contrast. COMPARISON:  Prior CT from 08/10/2018 FINDINGS: Brain: Examination markedly limited as the patient was unable to tolerate the full length of the study. Axial diffusion-weighted imaging only was performed. Additionally, images provided are moderately degraded by motion artifact. DWI sequence demonstrates apparent somewhat linear focus of increased diffusion overlying the anterior left temporal lobe, adjacent to the left temporal horn (series 9, image 65). This measures approximately 2 cm in length. Unclear whether this reflects artifact or possibly acute ischemia. No other diffusion abnormality to suggest acute or subacute ischemia. Gray-white matter differentiation otherwise grossly maintained. No discernible mass lesion, mass effect, or midline shift. No hydrocephalus. No extra-axial fluid collection. Vascular: Not well assessed on this limited examination. Skull and upper cervical spine: Not well assessed on this limited examination. Sinuses/Orbits: Not well assessed on this limited examination. Other: None. IMPRESSION: 1. Technically limited exam due to patient's inability to tolerate the full length of the exam and motion artifact. Axial DWI imaging only was performed. 2. Apparent 2 cm focus of linear diffusion abnormality involving the left temporal lobe as above. While this finding may be artifactual nature, sequelae of acute ischemia difficult to exclude, and could be considered in the correct clinical setting. If clinical picture is equivocal for possible ischemia, a repeat study could be performed for further evaluation as the  patient is able to tolerate. Electronically Signed   By: Jeannine Boga M.D.   On: 08/12/2018 02:00   Dg Chest Port 1 View  Result Date: 08/12/2018 CLINICAL DATA:  Dyspnea EXAM: PORTABLE CHEST 1 VIEW COMPARISON:  Chest radiograph from earlier today. FINDINGS: Stable cardiomediastinal silhouette with normal heart size. No pneumothorax. No pleural effusion. Mild scarring versus atelectasis at the left costophrenic angle. No pulmonary edema. No acute consolidative airspace disease. Low lung volumes. IMPRESSION: Low lung volumes with mild left costophrenic angle scarring versus atelectasis. Electronically Signed   By: Ilona Sorrel M.D.   On: 08/12/2018 10:24   Dg Chest Port 1 View  Result Date: 08/12/2018 CLINICAL DATA:  Worsening shortness of breath EXAM: PORTABLE CHEST  1 VIEW COMPARISON:  08/11/2018, 08/10/2018 FINDINGS: Low lung volumes. No acute opacity or pleural effusion. Normal heart size. No pneumothorax. Old right-sided rib fractures. IMPRESSION: No active disease. Electronically Signed   By: Donavan Foil M.D.   On: 08/12/2018 02:39   Dg Chest Port 1 View  Result Date: 08/11/2018 CLINICAL DATA:  Altered mental status EXAM: PORTABLE CHEST 1 VIEW COMPARISON:  08/10/2018 FINDINGS: Low lung volumes. Cardiomegaly with mild central congestion. No focal airspace disease or effusion. No pneumothorax. IMPRESSION: Cardiomegaly with mild central congestion. Low lung volume without focal airspace disease. Electronically Signed   By: Donavan Foil M.D.   On: 08/11/2018 23:12        Scheduled Meds:  aspirin EC  81 mg Oral Daily   chlorhexidine  15 mL Mouth Rinse BID   enoxaparin (LOVENOX) injection  40 mg Subcutaneous Q24H   insulin aspart  0-5 Units Subcutaneous QHS   insulin aspart  0-9 Units Subcutaneous TID WC   mouth rinse  15 mL Mouth Rinse q12n4p   metoprolol tartrate  25 mg Oral Daily   minocycline  100 mg Oral BID   mirtazapine  30 mg Oral QHS   risperiDONE  2 mg Oral  Daily   risperiDONE  4 mg Oral QHS   sodium chloride flush  3 mL Intravenous Q12H   Continuous Infusions:  sodium chloride       LOS: 1 day    Time spent: 29 minutes      Maalik Pinn J [place] DO [facility] Pager [phone]  If 7PM-7AM, please contact night-coverage [email or web address] Password [email or web address] 08/13/2018, 1:09 PM

## 2018-08-14 LAB — GLUCOSE, CAPILLARY
Glucose-Capillary: 115 mg/dL — ABNORMAL HIGH (ref 70–99)
Glucose-Capillary: 240 mg/dL — ABNORMAL HIGH (ref 70–99)
Glucose-Capillary: 81 mg/dL (ref 70–99)
Glucose-Capillary: 110 mg/dL — ABNORMAL HIGH (ref 70–99)
Glucose-Capillary: 95 mg/dL (ref 70–99)
Glucose-Capillary: 94 mg/dL (ref 70–99)
Glucose-Capillary: 111 mg/dL — ABNORMAL HIGH (ref 70–99)

## 2018-08-14 NOTE — Consult Note (Signed)
Telepsych Consultation   Reason for Consult:  "65M w/ Hx schizophrenia admit with psychosis complicated by resp failure 2/2 Narcs/benzos. IVC'd. Please assist w/ management of schizophrenia and tx to [facility]." Referring Physician: Dr. Eric [place] Location of Patient: [facility] Location of Provider: [facility]  Patient Identification: [name] [name]. MRN:  [id number] Principal Diagnosis: Schizophrenia ([place]) Diagnosis:  Active Problems:   HTN (hypertension)   Renal insufficiency   Schizophrenia, disorganized type (HCC)   DM (diabetes mellitus) type II uncontrolled, periph vascular disorder (HCC)   Schizoaffective disorder, bipolar type ([place])   Acute respiratory failure with hypercapnia (HCC)   Total Time spent with patient: 1 hour  Subjective:   [name] Blann. is a 53 y.o. male patient admitted with acute respiratory failure.  HPI:   Per chart review, patient initially presented to the hospital with psychosis. His caregiver reported that he had not been sleeping since Sunday. He was found to have a gun and was pointing it around in the lobby. He also had a knife. He was taken into custody by [employer]. He was admitted to the medical floor for acute renal failure and hypoxia on 5/28. He was medically cleared and admitted to [facility] for psychosis on 5/29. He was sent to [facility] for evaluation of altered mental status after arriving at [facility]. He was reportedly given medications (Dilaudid and Ativan) which may have contributed to his presentation. He was admitted with acute respiratory failure with hypoxia and hypercapnia complicated by acute metabolic versus toxic encephalopathy. Home medications include Remeron 30 mg qhs, Risperdal 2 mg q am and 4 mg qhs and Trazodone 100 mg qhs PRN. He is also prescribed Haldol 2 mg q 6 hours PRN for agitation. He received 2 doses yesterday. BAL and UDS were negative on admission.   Of note, patient was last seen by his outpatient provider on 3/26.  It is noted that his legal guardian is a person named [name]. His mother passed away in 2023-02-05 and he was previously living with her in [place].   On interview, [name] [name] is rambling and nonsensical throughout interview. He denies SI or AH. He is unable to answer further questions. He does report the year is 2020.   Past Psychiatric History: Schizophrenia   Risk to Self:  None. Denies SI. Risk to Others:   Yes given recent aggressive/erratic behavior with a gun and knife.  Prior Inpatient Therapy:  He has a history of multiple hospitalizations. He was last hospitalized at [facility] in 11/2017 for bizarre behavior and psychosis.  Prior Outpatient Therapy:  He is followed by Dr. Adele Schilder.   Past Medical History:  Past Medical History:  Diagnosis Date  . Diabetes mellitus type II, uncontrolled ([place])   . HTN (hypertension)   . Hyperlipidemia LDL goal <70   . Schizophrenia ([place])    No past surgical history on file. Family History:  Family History  Problem Relation Age of Onset  . Breast cancer Mother   . Diabetes Mother   . Prostate cancer Father   . Hypertension Father   . Breast cancer Maternal Aunt   . Diabetes Maternal Uncle    Family Psychiatric  History: None per chart review.  Social History:  Social History   Substance and Sexual Activity  Alcohol Use Yes   Comment: occasional beer     Social History   Substance and Sexual Activity  Drug Use Yes  . Types: Marijuana    Social History   Socioeconomic History  .  Marital status: Single    Spouse name: Not on file  . Number of children: Not on file  . Years of education: Not on file  . Highest education level: Not on file  Occupational History  . Not on file  Social Needs  . Financial resource strain: Not on file  . Food insecurity:    Worry: Not on file    Inability: Not on file  . Transportation needs:    Medical: Not on file    Non-medical: Not on file  Tobacco Use  . Smoking status: Current Every Day  Smoker    Packs/day: 1.25    Years: 30.00    Pack years: 37.50    Types: Cigarettes, Cigars  . Smokeless tobacco: Never Used  Substance and Sexual Activity  . Alcohol use: Yes    Comment: occasional beer  . Drug use: Yes    Types: Marijuana  . Sexual activity: Never  Lifestyle  . Physical activity:    Days per week: Not on file    Minutes per session: Not on file  . Stress: Not on file  Relationships  . Social connections:    Talks on phone: Not on file    Gets together: Not on file    Attends religious service: Not on file    Active member of club or organization: Not on file    Attends meetings of clubs or organizations: Not on file    Relationship status: Not on file  Other Topics Concern  . Not on file  Social History Narrative   Lives with mom   unemployed   No children   Additional Social History: N/A    Allergies:  No Known Allergies  Labs:  Results for orders placed or performed during the hospital encounter of 08/12/18 (from the past 48 hour(s))  Glucose, capillary     Status: Abnormal   Collection Time: 08/12/18 12:20 PM  Result Value Ref Range   Glucose-Capillary 165 (H) 70 - 99 mg/dL  Glucose, capillary     Status: Abnormal   Collection Time: 08/12/18  5:32 PM  Result Value Ref Range   Glucose-Capillary 116 (H) 70 - 99 mg/dL  Glucose, capillary     Status: Abnormal   Collection Time: 08/12/18  9:17 PM  Result Value Ref Range   Glucose-Capillary 127 (H) 70 - 99 mg/dL  CBC     Status: Abnormal   Collection Time: 08/13/18  3:06 AM  Result Value Ref Range   WBC 9.7 4.0 - 10.5 K/uL   RBC 4.49 4.22 - 5.81 MIL/uL   Hemoglobin 13.0 13.0 - 17.0 g/dL   HCT 40.2 39.0 - 52.0 %   MCV 89.5 80.0 - 100.0 fL   MCH 29.0 26.0 - 34.0 pg   MCHC 32.3 30.0 - 36.0 g/dL   RDW 13.8 11.5 - 15.5 %   Platelets 459 (H) 150 - 400 K/uL   nRBC 0.0 0.0 - 0.2 %    Comment: Performed at [facility], [address], [place], [place] [address]  Basic metabolic panel      Status: Abnormal   Collection Time: 08/13/18  3:06 AM  Result Value Ref Range   Sodium 133 (L) 135 - 145 mmol/L    Comment: DELTA CHECK NOTED   Potassium 3.9 3.5 - 5.1 mmol/L   Chloride 99 98 - 111 mmol/L   CO2 21 (L) 22 - 32 mmol/L   Glucose, Bld 116 (H) 70 - 99 mg/dL  BUN 17 6 - 20 mg/dL   Creatinine, Ser 1.79 (H) 0.61 - 1.24 mg/dL   Calcium 8.9 8.9 - 10.3 mg/dL   GFR calc non Af Amer 43 (L) >60 mL/min   GFR calc Af Amer 50 (L) >60 mL/min   Anion gap 13 5 - 15    Comment: Performed at [facility] [address] [place], [place] [address]  Magnesium     Status: None   Collection Time: 08/13/18  3:06 AM  Result Value Ref Range   Magnesium 1.9 1.7 - 2.4 mg/dL    Comment: Performed at [facility] [address] [place], [place] [address]  Glucose, capillary     Status: Abnormal   Collection Time: 08/13/18  4:21 AM  Result Value Ref Range   Glucose-Capillary 110 (H) 70 - 99 mg/dL  Glucose, capillary     Status: Abnormal   Collection Time: 08/13/18  9:00 AM  Result Value Ref Range   Glucose-Capillary 137 (H) 70 - 99 mg/dL  Glucose, capillary     Status: Abnormal   Collection Time: 08/13/18 12:55 PM  Result Value Ref Range   Glucose-Capillary 106 (H) 70 - 99 mg/dL  Glucose, capillary     Status: Abnormal   Collection Time: 08/13/18  4:46 PM  Result Value Ref Range   Glucose-Capillary 100 (H) 70 - 99 mg/dL  Glucose, capillary     Status: None   Collection Time: 08/13/18  7:51 PM  Result Value Ref Range   Glucose-Capillary 97 70 - 99 mg/dL   Comment 1 Notify RN    Comment 2 Document in Chart   Glucose, capillary     Status: Abnormal   Collection Time: 08/13/18  9:16 PM  Result Value Ref Range   Glucose-Capillary 112 (H) 70 - 99 mg/dL  Glucose, capillary     Status: Abnormal   Collection Time: 08/14/18  6:41 AM  Result Value Ref Range   Glucose-Capillary 115 (H) 70 - 99 mg/dL   Comment 1 Notify RN    Comment 2 Document in Chart   Glucose, capillary      Status: Abnormal   Collection Time: 08/14/18  8:40 AM  Result Value Ref Range   Glucose-Capillary 240 (H) 70 - 99 mg/dL    Medications:  Current Facility-Administered Medications  Medication Dose Route Frequency Provider Last Rate Last Dose  . 0.9 %  sodium chloride infusion  250 mL Intravenous PRN Phillips Grout, MD      . aspirin EC tablet 81 mg  81 mg Oral Daily Derrill Kay A, MD   81 mg at 08/14/18 0923  . chlorhexidine (PERIDEX) 0.12 % solution 15 mL  15 mL Mouth Rinse BID [place] Eric J, DO   15 mL at 08/14/18 0925  . enoxaparin (LOVENOX) injection 40 mg  40 mg Subcutaneous Q24H Derrill Kay A, MD   40 mg at 08/12/18 1612  . haloperidol lactate (HALDOL) injection 2 mg  2 mg Intravenous Q6H PRN [place] Eric J, DO   2 mg at 08/13/18 1600  . insulin aspart (novoLOG) injection 0-5 Units  0-5 Units Subcutaneous QHS [place] Eric J, DO      . insulin aspart (novoLOG) injection 0-9 Units  0-9 Units Subcutaneous TID WC [place] Eric J, DO   3 Units at 08/14/18 0845  . MEDLINE mouth rinse  15 mL Mouth Rinse q12n4p [place] Eric J, DO   15 mL at 08/13/18 1624  . metoprolol  tartrate (LOPRESSOR) tablet 25 mg  25 mg Oral Daily Derrill Kay A, MD   25 mg at 08/14/18 0923  . minocycline (MINOCIN) capsule 100 mg  100 mg Oral BID Derrill Kay A, MD   100 mg at 08/14/18 2951  . mirtazapine (REMERON) tablet 30 mg  30 mg Oral QHS Derrill Kay A, MD   30 mg at 08/13/18 2106  . ondansetron (ZOFRAN) tablet 4 mg  4 mg Oral Q6H PRN Phillips Grout, MD       Or  . ondansetron [facility]) injection 4 mg  4 mg Intravenous Q6H PRN Phillips Grout, MD      . risperiDONE (RISPERDAL) tablet 2 mg  2 mg Oral Daily Derrill Kay A, MD   2 mg at 08/14/18 0924  . risperiDONE (RISPERDAL) tablet 4 mg  4 mg Oral QHS Derrill Kay A, MD   4 mg at 08/13/18 2106  . sodium chloride flush (NS) 0.9 % injection 3 mL  3 mL Intravenous Q12H Derrill Kay A, MD   3 mL at 08/14/18 0925  . sodium chloride flush (NS) 0.9 %  injection 3 mL  3 mL Intravenous PRN Phillips Grout, MD      . traZODone (DESYREL) tablet 100 mg  100 mg Oral QHS PRN Phillips Grout, MD   100 mg at 08/12/18 2243    Musculoskeletal: Strength & Muscle Tone: No atrophy noted. Gait & Station: UTA since patient is sitting in bed. Patient leans: N/A  Psychiatric Specialty Exam: Physical Exam  Nursing note and vitals reviewed. Constitutional: He is oriented to person, place, and time. He appears well-developed and well-nourished.  HENT:  Head: Normocephalic and atraumatic.  Neck: Normal range of motion.  Respiratory: Effort normal.  Musculoskeletal: Normal range of motion.  Neurological: He is alert and oriented to person, place, and time.  Psychiatric: His behavior is normal. His affect is inappropriate. His speech is tangential. Thought content is delusional. Cognition and memory are impaired. He expresses impulsivity.    Review of Systems  Psychiatric/Behavioral: Negative for hallucinations and suicidal ideas.  All other systems reviewed and are negative.   Blood pressure 128/82, pulse 95, temperature 98.7 F (37.1 C), temperature source Oral, resp. rate 15, weight 117.8 kg, SpO2 95 %.Body mass index is 40.69 kg/m.  General Appearance: Disheveled, obese, middle aged male who is sitting in bed and eating lunch. NAD.   Eye Contact:  Fair  Speech:  Garbled  Volume:  Normal  Mood:  Euthymic  Affect:  Constricted  Thought Process:  Disorganized and Descriptions of Associations: Tangential  Orientation:  Full (Time, Place, and Person)  Thought Content:  Illogical and Delusions  Suicidal Thoughts:  No  Homicidal Thoughts:  No  Memory:  Immediate;   Fair Recent;   Fair Remote;   Fair  Judgement:  Impaired  Insight:  Lacking  Psychomotor Activity:  Normal  Concentration:  Concentration: Poor and Attention Span: Poor  Recall:  [name] of Knowledge:  Fair  Language:  Fair  Akathisia:  No  Handed:  Right  AIMS (if  indicated):   N/A  Assets:  Housing Physical Health Resilience Social Support  ADL's:  Intact  Cognition: Impaired due to altered mental status.   Sleep:   N/A   Assessment:  [name] Koral. is a 53 y.o. male was admitted with acute respiratory failure with hypoxia and hypercapnia complicated by acute metabolic versus toxic encephalopathy. Patient is disorganized and nonsensical in speech  although he is oriented. He continues to warrant inpatient psychiatric hospitalization once he is medically cleared.   Treatment Plan Summary: -Patient warrants inpatient psychiatric hospitalization given active psychosis. -Continue bedside sitter.  -Continue Remeron 30 mg qhs for mood, Risperdal 2 mg q am and 4 mg qhs for psychosis and Trazodone 100 mg qhs PRN for insomnia. -EKG reviewed and QTc 426 on 5/29. Please closely monitor when starting or increasing QTc prolonging agents.  -Patient is under IVC and therefore cannot leave the hospital.  -Will sign off on patient at this time. Please consult psychiatry again as needed.     Disposition: Recommend psychiatric Inpatient admission when medically cleared.  This service was provided via telemedicine using a 2-way, interactive audio and video technology.  Names of all persons participating in this telemedicine service and their role in this encounter. Name: Buford Dresser, DO Role: Psychiatrist  Name: [name] [name] Role: Patient    Faythe Dingwall, DO 08/14/2018 10:36 AM

## 2018-08-14 NOTE — Progress Notes (Addendum)
PROGRESS NOTE    [name] [name].  [name] [name] DOB: [birth year] DOA: 08/12/2018 PCP: Ann Held, DO    Brief Narrative:   [name] [name]. is a 53 y.o. male with medical history significant of DM; HTN; HLD; and schizophrenia presenting with  Psychosis. Caretaker reported that he had not slept since Sunday.  Patient was found to have a gun that he was pointing around in the lobby and also a knife, and [employer] took him into custody.  The patient was unable to provide history at the time of admission.  He was given sedatives prior to my evaluation and was obtunded.  Psych eval - increasingly agitated per caregiver.  IVC done after gun and knife in waiting room.  AKI - given bolus.  Will recheck BMP and if normalizing can go to inpatient [facility] today.  If not normalizing, can observe overnight for IV hydration.  Patient was discharged to behavioral health hospital on 08/11/2018 for further psychiatric care.  Apparently when patient arrived he was agitated and given Dilaudid and Ativan per nursing staff.  He became unresponsive and was transferred back to [facility] due to his current mental status.  Assessment & Plan:   Principal Problem:   Schizophrenia ([place]) Active Problems:   HTN (hypertension)   Renal insufficiency   Schizophrenia, disorganized type ([place])   DM (diabetes mellitus) type II uncontrolled, periph vascular disorder (HCC)   Schizoaffective disorder, bipolar type ([place])   Acute respiratory failure with hypercapnia (HCC)  Acute respiratory failure with hypoxia and hypercapnia: Resolved Acute metabolic versus toxic encephalopathy: Resolved Patient representing back to [facility] following recent discharge earlier in the evening to behavioral health hospital for further evaluation and treatment of his underlying psychosis secondary to schizophrenia.  Apparently, per nursing staff when patient was at the behavioral health hospital he was agitated and given  sedatives to include Dilaudid and Ativan.  Following administration of these medications, he became unresponsive and subsequently was transferred back to [facility].  On arrival to patient's room this morning, patient was noted to be obtunded on BiPAP.  No response to sternal rub and noted pinpoint pupils, likely secondary to drug overdose with benzodiazepines and narcotics.  Patient was given flumazenil 0.2 mg IV and Narcan 0.2 mg IV with mild improvement of his mentation.  This was followed by an additional dose of flumazenil 0.3 mg IV and Narcan 0.2 mg IV with resolution of his encephalopathy. --Titrated off of supplemental oxygen, doing well on room air --Avoid narcotics and benzos in this patient --Supportive care --Continue one-to-one sitter  Schizophrenia with acute psychosis Patient initially presenting after being found with a gun and knife and subdued by [employer]  Per his roommate, he has not been sleeping recently and likely noncompliant with his home medications.  He has been deemed a danger to himself and others and has been recommended for inpatient behavioral health treatment.  Currently on involuntary commitment. --Continues with rambling and incoherent speech --Continue involuntary commitment, 1:1 safety sitter --continue Risperdal 2 mg every morning and 4 mg every afternoon. --Trazodone 100 mg nightly --Haldol 2 mg IV every 6 hours as needed for agitation, has received 3 doses in the past 24 hours, hopefully will be less now he is able to take his Risperdal. --Psychiatry eval 08/14/2018, will need inpatient psychiatric hospitalization; case management for coordination  Essential hypertension BP controlled, 128/50 this morning --Continue metoprolol tartrate 25 mg p.o. daily  Type 2 diabetes mellitus Last hemoglobin  A1c 6.6 on 04/06/2018.  Was previously on metformin but discontinued due to renal insufficiency; in favor of glyburide.  Review of blood sugars,  notable for well-controlled with consistent carbohydrate diet.  May not require oral hypoglycemics on discharge. --Hold oral hypoglycemics while inpatient --Consistent carbohydrate diet --Insulin sliding scale for coverage --CBG's qAC/HS  Cellulitis left lower extremity --Continue minocycline x7 days  Acute on CKD stage III: resolved Baseline creatinine between 1.6 - 2.0 over the past year.  Initially admitted with a creatinine of 2.36, likely secondary to prerenal failure secondary to dehydration and continuation of his home metformin. --Cr down to 1.79 today --Metformin discontinued --Avoid ACE inhibitors and NSAIDs --Monitor BMP daily   DVT prophylaxis: Lovenox Code Status: Full code Family Communication: None Disposition Plan: Medically stable for discharge, psychiatry eval on 08/14/2018 with recommendations of continued inpatient psychiatric hospitalization, case management for coordination   Consultants:   Psychiatry, Dr. Darleene Cleaver and Dr. Mariea Clonts  Procedures:   none  Antimicrobials:   none   Subjective: Patient seen and examined at bedside, sitting on bedside eating breakfast, safety sitter present.  Continues with confabulations and incoherent speech.  Currently tolerating his medications and diet.  Resolution from his acute encephalopathy due to medication overdose with benzos and narcotics.  Titrated off of supplemental oxygen.  Required 3 doses of 2 mg IV Haldol past 24 hours.  Medically stable for discharge to inpatient psychiatric hospital.  Objective: Vitals:   08/13/18 1453 08/13/18 1453 08/13/18 1946 08/14/18 0612  BP: 138/88 130/87 127/84 128/82  Pulse: 90 97 96 95  Resp: 18 16 20 15   Temp: 98 F (36.7 C) 98.9 F (37.2 C) 98.1 F (36.7 C) 98.7 F (37.1 C)  TempSrc: Oral Oral Oral Oral  SpO2: 94% 90% 96% 95%  Weight:        Intake/Output Summary (Last 24 hours) at 08/14/2018 1428 Last data filed at 08/14/2018 1300 Gross per 24 hour  Intake 1863 ml    Output 3 ml  Net 1860 ml   Filed Weights   08/12/18 0657 08/13/18 0726  Weight: 117 kg 117.8 kg    Examination:  General exam: Alert, confabulating speech, no acute distress Respiratory system: CTAB, no wheezing/crackles/rales, normal respiratory effort, on room air Cardiovascular system: S1 & S2 heard, RRR. No JVD, murmurs, rubs, gallops or clicks. No pedal edema. Gastrointestinal system: Abdomen is nondistended, protuberant, soft and nontender. No organomegaly or masses felt. Normal bowel sounds heard. Central nervous system: Alert, no focal neurologic abnormality Extremities: No pedal edema Skin: Left lower extremity with mild erythema no weeping no purulence no lesions Psychiatry: Confabulating and disorganized speech    Data Reviewed: I have personally reviewed following labs and imaging studies  CBC: Recent Labs  Lab 08/10/18 0744 08/11/18 0233 08/11/18 2054 08/11/18 2222 08/12/18 0433 08/12/18 0440 08/13/18 0306  WBC 14.5* 10.3 10.1  --  12.3*  --  9.7  NEUTROABS 11.8*  --  7.5  --   --   --   --   HGB 13.3 12.5* 13.1 13.3 14.1 14.3 13.0  HCT 41.2 38.6* 41.4 39.0 44.6 42.0 40.2  MCV 88.2 89.1 90.8  --  92.3  --  89.5  PLT 503* 475* 477*  --  438*  --  094*   Basic Metabolic Panel: Recent Labs  Lab 08/10/18 1335 08/11/18 0233 08/11/18 2054 08/11/18 2222 08/12/18 0433 08/12/18 0440 08/13/18 0306  NA 136 137 138 141 140 140 133*  K 3.7 3.9 4.0 4.0 4.6  4.5 3.9  CL 103 105 105  --  103  --  99  CO2 22 23 21*  --  24  --  21*  GLUCOSE 119* 98 98  --  92  --  116*  BUN 12 14 18   --  17  --  17  CREATININE 2.11* 1.98* 2.11*  --  2.07*  --  1.79*  CALCIUM 9.1 8.9 9.2  --  9.1  --  8.9  MG  --   --   --   --   --   --  1.9   GFR: Estimated Creatinine Clearance: 60.6 mL/min (A) (by C-G formula based on SCr of 1.79 mg/dL (H)). Liver Function Tests: Recent Labs  Lab 08/11/18 2054 08/12/18 0433  AST 26 27  ALT 21 21  ALKPHOS 35* 42  BILITOT 0.6 0.3   PROT 6.7 6.9  ALBUMIN 3.5 3.5   No results for input(s): LIPASE, AMYLASE in the last 168 hours. Recent Labs  Lab 08/11/18 2054  AMMONIA 35   Coagulation Profile: No results for input(s): INR, PROTIME in the last 168 hours. Cardiac Enzymes: Recent Labs  Lab 08/11/18 2054  CKTOTAL 328  TROPONINI <0.03   BNP (last 3 results) No results for input(s): PROBNP in the last 8760 hours. HbA1C: No results for input(s): HGBA1C in the last 72 hours. CBG: Recent Labs  Lab 08/13/18 1951 08/13/18 2116 08/14/18 0641 08/14/18 0840 08/14/18 1154  GLUCAP 97 112* 115* 240* 81   Lipid Profile: No results for input(s): CHOL, HDL, LDLCALC, TRIG, CHOLHDL, LDLDIRECT in the last 72 hours. Thyroid Function Tests: No results for input(s): TSH, T4TOTAL, FREET4, T3FREE, THYROIDAB in the last 72 hours. Anemia Panel: No results for input(s): VITAMINB12, FOLATE, FERRITIN, TIBC, IRON, RETICCTPCT in the last 72 hours. Sepsis Labs: Recent Labs  Lab 08/11/18 2318  LATICACIDVEN 0.4*    Recent Results (from the past 240 hour(s))  SARS Coronavirus 2 (CEPHEID - Performed in [place] hospital lab), Hosp Order     Status: None   Collection Time: 08/10/18  8:51 AM  Result Value Ref Range Status   SARS Coronavirus 2 NEGATIVE NEGATIVE Final    Comment: (NOTE) If result is NEGATIVE SARS-CoV-2 target nucleic acids are NOT DETECTED. The SARS-CoV-2 RNA is generally detectable in upper and lower  respiratory specimens during the acute phase of infection. The lowest  concentration of SARS-CoV-2 viral copies this assay can detect is 250  copies / mL. A negative result does not preclude SARS-CoV-2 infection  and should not be used as the sole basis for treatment or other  patient management decisions.  A negative result may occur with  improper specimen collection / handling, submission of specimen other  than nasopharyngeal swab, presence of viral mutation(s) within the  areas targeted by this assay, and  inadequate number of viral copies  (<250 copies / mL). A negative result must be combined with clinical  observations, patient history, and epidemiological information. If result is POSITIVE SARS-CoV-2 target nucleic acids are DETECTED. The SARS-CoV-2 RNA is generally detectable in upper and lower  respiratory specimens dur ing the acute phase of infection.  Positive  results are indicative of active infection with SARS-CoV-2.  Clinical  correlation with patient history and other diagnostic information is  necessary to determine patient infection status.  Positive results do  not rule out bacterial infection or co-infection with other viruses. If result is PRESUMPTIVE POSTIVE SARS-CoV-2 nucleic acids MAY BE PRESENT.   A  presumptive positive result was obtained on the submitted specimen  and confirmed on repeat testing.  While 2019 novel coronavirus  (SARS-CoV-2) nucleic acids may be present in the submitted sample  additional confirmatory testing may be necessary for epidemiological  and / or clinical management purposes  to differentiate between  SARS-CoV-2 and other Sarbecovirus currently known to infect humans.  If clinically indicated additional testing with an alternate test  methodology [phone]) is advised. The SARS-CoV-2 RNA is generally  detectable in upper and lower respiratory sp ecimens during the acute  phase of infection. The expected result is Negative. Fact Sheet for Patients:  StrictlyIdeas.no Fact Sheet for Healthcare Providers: BankingDealers.co.za This test is not yet approved or cleared by the [place] FDA and has been authorized for detection and/or diagnosis of SARS-CoV-2 by FDA under an Emergency Use Authorization (EUA).  This EUA will remain in effect (meaning this test can be used) for the duration of the COVID-19 declaration under Section 564(b)(1) of the Act, 21 U.S.C. section 360bbb-3(b)(1), unless the  authorization is terminated or revoked sooner. Performed at [facility], [place] [address], [place], [place] [address]   Blood culture (routine x 2)     Status: None (Preliminary result)   Collection Time: 08/11/18 11:18 PM  Result Value Ref Range Status   Specimen Description BLOOD RIGHT HAND  Final   Special Requests   Final    BOTTLES DRAWN AEROBIC AND ANAEROBIC Blood Culture adequate volume   Culture   Final    NO GROWTH 2 DAYS Performed at [facility], [place] [address], [place], [place] [address]    Report Status PENDING  Incomplete  Blood culture (routine x 2)     Status: None (Preliminary result)   Collection Time: 08/11/18 11:50 PM  Result Value Ref Range Status   Specimen Description BLOOD RIGHT HAND  Final   Special Requests   Final    BOTTLES DRAWN AEROBIC AND ANAEROBIC Blood Culture adequate volume   Culture   Final    NO GROWTH 2 DAYS Performed at [facility], [place] [address], [place], [place] [address]    Report Status PENDING  Incomplete         Radiology Studies: No results found.      Scheduled Meds:  aspirin EC  81 mg Oral Daily   chlorhexidine  15 mL Mouth Rinse BID   enoxaparin (LOVENOX) injection  40 mg Subcutaneous Q24H   insulin aspart  0-5 Units Subcutaneous QHS   insulin aspart  0-9 Units Subcutaneous TID WC   mouth rinse  15 mL Mouth Rinse q12n4p   metoprolol tartrate  25 mg Oral Daily   minocycline  100 mg Oral BID   mirtazapine  30 mg Oral QHS   risperiDONE  2 mg Oral Daily   risperiDONE  4 mg Oral QHS   sodium chloride flush  3 mL Intravenous Q12H   Continuous Infusions:  sodium chloride       LOS: 2 days    Time spent: 29 minutes      [name] [name] J [place] DO [facility] Pager [phone]  If 7PM-7AM, please contact night-coverage [email or web address] Password [email or web address] 08/14/2018, 2:28 PM

## 2018-08-14 NOTE — Progress Notes (Signed)
CSW received referral regarding inpatient psych placement. Per [facility], they are unable to accept patient back. CSW sent referral to [facility] [facility].  Percell Locus Tamisha Nordstrom LCSW [phone]

## 2018-08-15 LAB — GLUCOSE, CAPILLARY
Glucose-Capillary: 113 mg/dL — ABNORMAL HIGH (ref 70–99)
Glucose-Capillary: 99 mg/dL (ref 70–99)
Glucose-Capillary: 96 mg/dL (ref 70–99)
Glucose-Capillary: 103 mg/dL — ABNORMAL HIGH (ref 70–99)

## 2018-08-15 NOTE — Progress Notes (Signed)
PROGRESS NOTE    [name] [name].  OIN:[id number] DOB: [birth year] DOA: 08/12/2018 PCP: Ann Held, DO    Brief Narrative:   [name] [name]. is a 53 y.o. male with medical history significant of DM; HTN; HLD; and schizophrenia presenting with  Psychosis. Caretaker reported that he had not slept since Sunday.  Patient was found to have a gun that he was pointing around in the lobby and also a knife, and [employer] took him into custody.  The patient was unable to provide history at the time of admission.  He was given sedatives prior to my evaluation and was obtunded.  Psych eval - increasingly agitated per caregiver.  IVC done after gun and knife in waiting room.  AKI - given bolus.  Will recheck BMP and if normalizing can go to inpatient [facility] today.  If not normalizing, can observe overnight for IV hydration.  Patient was discharged to behavioral health hospital on 08/11/2018 for further psychiatric care.  Apparently when patient arrived he was agitated and given Dilaudid and Ativan per nursing staff.  He became unresponsive and was transferred back to [facility] due to his current mental status.  Assessment & Plan:   Principal Problem:   Schizophrenia ([place]) Active Problems:   HTN (hypertension)   Renal insufficiency   Schizophrenia, disorganized type ([place])   DM (diabetes mellitus) type II uncontrolled, periph vascular disorder (HCC)   Schizoaffective disorder, bipolar type ([place])   Acute respiratory failure with hypercapnia (HCC)  Acute respiratory failure with hypoxia and hypercapnia: Resolved Acute metabolic versus toxic encephalopathy: Resolved Patient representing back to [facility] following recent discharge earlier in the evening to behavioral health hospital for further evaluation and treatment of his underlying psychosis secondary to schizophrenia.  Apparently, per nursing staff when patient was at the behavioral health hospital he was agitated and given  sedatives to include Dilaudid and Ativan.  Following administration of these medications, he became unresponsive and subsequently was transferred back to [facility].  On arrival to patient's room this morning, patient was noted to be obtunded on BiPAP.  No response to sternal rub and noted pinpoint pupils, likely secondary to drug overdose with benzodiazepines and narcotics.  Patient was given flumazenil 0.2 mg IV and Narcan 0.2 mg IV with mild improvement of his mentation.  This was followed by an additional dose of flumazenil 0.3 mg IV and Narcan 0.2 mg IV with resolution of his encephalopathy. --Titrated off of supplemental oxygen, doing well on room air --Avoid narcotics and benzos in this patient --Supportive care --Continue one-to-one sitter  Schizophrenia with acute psychosis Patient initially presenting after being found with a gun and knife and subdued by [employer]  Per his roommate, he has not been sleeping recently and likely noncompliant with his home medications.  He has been deemed a danger to himself and others and has been recommended for inpatient behavioral health treatment.  Currently on involuntary commitment. --Continues with rambling and incoherent speech --Continue involuntary commitment, 1:1 safety sitter --continue Risperdal 2 mg every morning and 4 mg every afternoon. --Trazodone 100 mg nightly --Haldol 2 mg IV every 6 hours as needed for agitation, has received 0 doses in the p24h --Psychiatry eval 08/14/2018, will need inpatient psychiatric hospitalization; case management for coordination, denied by [facility] and [facility].  Essential hypertension --Continue metoprolol tartrate 25 mg p.o. daily  Type 2 diabetes mellitus Last hemoglobin A1c 6.6 on 04/06/2018.  Was previously on metformin but discontinued due to  renal insufficiency; in favor of glyburide.  Review of blood sugars, well-controlled with consistent carbohydrate diet.  May not require oral  hypoglycemics on discharge. --Hold oral hypoglycemics while inpatient --Consistent carbohydrate diet --Insulin sliding scale for coverage --CBG's qAC/HS  Cellulitis left lower extremity --Continue minocycline x7 days  Acute on CKD stage III: resolved Baseline creatinine between 1.6 - 2.0 over the past year.  Initially admitted with a creatinine of 2.36, likely secondary to prerenal failure secondary to dehydration and continuation of his home metformin. --Cr improved to 1.79 --Metformin discontinued --Avoid ACE inhibitors and NSAIDs   DVT prophylaxis: Lovenox Code Status: Full code Family Communication: None Disposition Plan: Medically stable for discharge, psychiatry eval on 08/14/2018 with recommendations of continued inpatient psychiatric hospitalization, case management for coordination.  Declined by [facility] and [facility].  Pending [facility] evaluation.   Consultants:   Psychiatry, Dr. Darleene Cleaver and Dr. Mariea Clonts  Procedures:   none  Antimicrobials:   minocycline   Subjective: Patient seen and examined at bedside, lying in bed comfortably, watching TV, safety sitter present.  Continues with confabulations and incoherent speech.  Tolerating his medications and diet.  Did not require any doses of Haldol in the past 24 hours.  Medically stable for discharge to inpatient psychiatric hospital.  Objective: Vitals:   08/14/18 1655 08/14/18 2143 08/15/18 0606 08/15/18 0654  BP: (!) 151/89 127/87 (!) 155/103   Pulse: 91 93 (!) 109   Resp: 18 18 (!) 22   Temp:  98.4 F (36.9 C) 98.6 F (37 C)   TempSrc:  Oral Oral   SpO2: 95% 97% 97%   Weight:    (!) 140 kg    Intake/Output Summary (Last 24 hours) at 08/15/2018 1335 Last data filed at 08/14/2018 1815 Gross per 24 hour  Intake 240 ml  Output --  Net 240 ml   Filed Weights   08/12/18 0657 08/13/18 0726 08/15/18 0654  Weight: 117 kg 117.8 kg (!) 140 kg    Examination:  General exam: Alert, confabulating speech, no acute  distress Respiratory system: CTAB, no wheezing/crackles/rales, normal respiratory effort, on room air Cardiovascular system: S1 & S2 heard, RRR. No JVD, murmurs, rubs, gallops or clicks. No pedal edema. Gastrointestinal system: Abdomen is nondistended, protuberant, soft and nontender. No organomegaly or masses felt. Normal bowel sounds heard. Central nervous system: Alert, no focal neurologic abnormality Extremities: No pedal edema Skin: Left lower extremity with mild erythema no weeping no purulence no lesions Psychiatry: Confabulating and disorganized speech    Data Reviewed: I have personally reviewed following labs and imaging studies  CBC: Recent Labs  Lab 08/10/18 0744 08/11/18 0233 08/11/18 2054 08/11/18 2222 08/12/18 0433 08/12/18 0440 08/13/18 0306  WBC 14.5* 10.3 10.1  --  12.3*  --  9.7  NEUTROABS 11.8*  --  7.5  --   --   --   --   HGB 13.3 12.5* 13.1 13.3 14.1 14.3 13.0  HCT 41.2 38.6* 41.4 39.0 44.6 42.0 40.2  MCV 88.2 89.1 90.8  --  92.3  --  89.5  PLT 503* 475* 477*  --  438*  --  544*   Basic Metabolic Panel: Recent Labs  Lab 08/10/18 1335 08/11/18 0233 08/11/18 2054 08/11/18 2222 08/12/18 0433 08/12/18 0440 08/13/18 0306  NA 136 137 138 141 140 140 133*  K 3.7 3.9 4.0 4.0 4.6 4.5 3.9  CL 103 105 105  --  103  --  99  CO2 22 23 21*  --  24  --  21*  GLUCOSE 119* 98 98  --  92  --  116*  BUN 12 14 18   --  17  --  17  CREATININE 2.11* 1.98* 2.11*  --  2.07*  --  1.79*  CALCIUM 9.1 8.9 9.2  --  9.1  --  8.9  MG  --   --   --   --   --   --  1.9   GFR: Estimated Creatinine Clearance: 66.8 mL/min (A) (by C-G formula based on SCr of 1.79 mg/dL (H)). Liver Function Tests: Recent Labs  Lab 08/11/18 2054 08/12/18 0433  AST 26 27  ALT 21 21  ALKPHOS 35* 42  BILITOT 0.6 0.3  PROT 6.7 6.9  ALBUMIN 3.5 3.5   No results for input(s): LIPASE, AMYLASE in the last 168 hours. Recent Labs  Lab 08/11/18 2054  AMMONIA 35   Coagulation Profile: No  results for input(s): INR, PROTIME in the last 168 hours. Cardiac Enzymes: Recent Labs  Lab 08/11/18 2054  CKTOTAL 328  TROPONINI <0.03   BNP (last 3 results) No results for input(s): PROBNP in the last 8760 hours. HbA1C: No results for input(s): HGBA1C in the last 72 hours. CBG: Recent Labs  Lab 08/14/18 1154 08/14/18 1709 08/14/18 2236 08/15/18 0750 08/15/18 1150  GLUCAP 81 94 111* 113* 99   Lipid Profile: No results for input(s): CHOL, HDL, LDLCALC, TRIG, CHOLHDL, LDLDIRECT in the last 72 hours. Thyroid Function Tests: No results for input(s): TSH, T4TOTAL, FREET4, T3FREE, THYROIDAB in the last 72 hours. Anemia Panel: No results for input(s): VITAMINB12, FOLATE, FERRITIN, TIBC, IRON, RETICCTPCT in the last 72 hours. Sepsis Labs: Recent Labs  Lab 08/11/18 2318  LATICACIDVEN 0.4*    Recent Results (from the past 240 hour(s))  SARS Coronavirus 2 (CEPHEID - Performed in [place] hospital lab), Hosp Order     Status: None   Collection Time: 08/10/18  8:51 AM  Result Value Ref Range Status   SARS Coronavirus 2 NEGATIVE NEGATIVE Final    Comment: (NOTE) If result is NEGATIVE SARS-CoV-2 target nucleic acids are NOT DETECTED. The SARS-CoV-2 RNA is generally detectable in upper and lower  respiratory specimens during the acute phase of infection. The lowest  concentration of SARS-CoV-2 viral copies this assay can detect is 250  copies / mL. A negative result does not preclude SARS-CoV-2 infection  and should not be used as the sole basis for treatment or other  patient management decisions.  A negative result may occur with  improper specimen collection / handling, submission of specimen other  than nasopharyngeal swab, presence of viral mutation(s) within the  areas targeted by this assay, and inadequate number of viral copies  (<250 copies / mL). A negative result must be combined with clinical  observations, patient history, and epidemiological information. If  result is POSITIVE SARS-CoV-2 target nucleic acids are DETECTED. The SARS-CoV-2 RNA is generally detectable in upper and lower  respiratory specimens dur ing the acute phase of infection.  Positive  results are indicative of active infection with SARS-CoV-2.  Clinical  correlation with patient history and other diagnostic information is  necessary to determine patient infection status.  Positive results do  not rule out bacterial infection or co-infection with other viruses. If result is PRESUMPTIVE POSTIVE SARS-CoV-2 nucleic acids MAY BE PRESENT.   A presumptive positive result was obtained on the submitted specimen  and confirmed on repeat testing.  While 2019 novel coronavirus  (SARS-CoV-2) nucleic  acids may be present in the submitted sample  additional confirmatory testing may be necessary for epidemiological  and / or clinical management purposes  to differentiate between  SARS-CoV-2 and other Sarbecovirus currently known to infect humans.  If clinically indicated additional testing with an alternate test  methodology [phone]) is advised. The SARS-CoV-2 RNA is generally  detectable in upper and lower respiratory sp ecimens during the acute  phase of infection. The expected result is Negative. Fact Sheet for Patients:  StrictlyIdeas.no Fact Sheet for Healthcare Providers: BankingDealers.co.za This test is not yet approved or cleared by the [place] FDA and has been authorized for detection and/or diagnosis of SARS-CoV-2 by FDA under an Emergency Use Authorization (EUA).  This EUA will remain in effect (meaning this test can be used) for the duration of the COVID-19 declaration under Section 564(b)(1) of the Act, 21 U.S.C. section 360bbb-3(b)(1), unless the authorization is terminated or revoked sooner. Performed at [facility], [place] [address], [place], [place] [address]   Blood culture (routine x 2)     Status: None  (Preliminary result)   Collection Time: 08/11/18 11:18 PM  Result Value Ref Range Status   Specimen Description BLOOD RIGHT HAND  Final   Special Requests   Final    BOTTLES DRAWN AEROBIC AND ANAEROBIC Blood Culture adequate volume   Culture   Final    NO GROWTH 3 DAYS Performed at [facility], [place] [address], [place], [place] [address]    Report Status PENDING  Incomplete  Blood culture (routine x 2)     Status: None (Preliminary result)   Collection Time: 08/11/18 11:50 PM  Result Value Ref Range Status   Specimen Description BLOOD RIGHT HAND  Final   Special Requests   Final    BOTTLES DRAWN AEROBIC AND ANAEROBIC Blood Culture adequate volume   Culture   Final    NO GROWTH 3 DAYS Performed at [facility], [place] [address], [place], [place] [address]    Report Status PENDING  Incomplete         Radiology Studies: No results found.      Scheduled Meds:  aspirin EC  81 mg Oral Daily   chlorhexidine  15 mL Mouth Rinse BID   enoxaparin (LOVENOX) injection  40 mg Subcutaneous Q24H   insulin aspart  0-5 Units Subcutaneous QHS   insulin aspart  0-9 Units Subcutaneous TID WC   mouth rinse  15 mL Mouth Rinse q12n4p   metoprolol tartrate  25 mg Oral Daily   minocycline  100 mg Oral BID   mirtazapine  30 mg Oral QHS   risperiDONE  2 mg Oral Daily   risperiDONE  4 mg Oral QHS   sodium chloride flush  3 mL Intravenous Q12H   Continuous Infusions:  sodium chloride       LOS: 3 days    Time spent: 29 minutes      Akacia Boltz J [place] DO [facility] Pager [phone]  If 7PM-7AM, please contact night-coverage [email or web address] Password [email or web address] 08/15/2018, 1:35 PM

## 2018-08-15 NOTE — Progress Notes (Addendum)
Patient denied by Ellin Mayhew due to medical acuity. Left voicemail for [facility].   Faxed referral to [place], [place], [facility], [place], [place], [place].   [name] Locus Fantashia Shupert LCSW [phone]

## 2018-08-15 NOTE — Care Management Important Message (Signed)
Important Message  Patient Details  Name: [name] [name]. MRN: [id number] Date of Birth: March 24, 1965   Medicare Important Message Given:  Yes  Due to illness patient unable to sign.   Unsigned copy left at the patient bedside  [name] 08/15/2018, 12:10 PM

## 2018-08-16 DIAGNOSIS — R0602 Shortness of breath: Secondary | ICD-10-CM

## 2018-08-16 LAB — GLUCOSE, CAPILLARY
Glucose-Capillary: 106 mg/dL — ABNORMAL HIGH (ref 70–99)
Glucose-Capillary: 110 mg/dL — ABNORMAL HIGH (ref 70–99)
Glucose-Capillary: 119 mg/dL — ABNORMAL HIGH (ref 70–99)
Glucose-Capillary: 101 mg/dL — ABNORMAL HIGH (ref 70–99)

## 2018-08-16 NOTE — Progress Notes (Signed)
CSW contacted by [employer], Judson Roch. Who reported that they had received referral information on patient but that it did not contain any Medical or [facility] provider information.  They also needed patient's latest labs, vitals, imaging and EKG.  CSW was not able to use the Epic referral process but printed out information containing latest Hospitalist note, [facility] Telepsych note and original Medical Admission Note from the [facility] Provider and faxed to [facility] directly via regular fax.  Requested that [place] contact patient's CSW, Chrys Racer, [place], with decision regarding patient.   Areatha Keas. Judi Cong, MSW, [place] Disposition Clinical Social Work [phone] (cell) [phone] (office)

## 2018-08-16 NOTE — Progress Notes (Signed)
PROGRESS NOTE    [name] [name].  BSW:[id number] DOB: Nov 29, 1965 DOA: 08/12/2018 PCP: Ann Held, DO    Brief Narrative:  53 y.o.malewith medical history significant ofDM; HTN; HLD; and schizophrenia presenting with Psychosis. Caretaker reported that he had not slept since Sunday. Patient was found to have a gun that he was pointing around in the lobby and also a knife, and [employer] took him into custody. The patient was unable to provide history at the time of admission. He was given sedatives prior to my evaluation and was obtunded.  Psych eval - increasingly agitated per caregiver. IVC done after gun and knife in waiting room. AKI - given bolus. Will recheck BMP and if normalizing can go to inpatient [facility] today. If not normalizing, can observe overnight for IV hydration.  Patient was discharged to behavioral health hospital on 08/11/2018 for further psychiatric care.  Apparently when patient arrived he was agitated and given Dilaudid and Ativan per nursing staff.  He became unresponsive and was transferred back to [facility] due to his current mental status.  Assessment & Plan:   Principal Problem:   Schizophrenia ([place]) Active Problems:   HTN (hypertension)   Renal insufficiency   Schizophrenia, disorganized type ([place])   DM (diabetes mellitus) type II uncontrolled, periph vascular disorder (HCC)   Schizoaffective disorder, bipolar type ([place])   Acute respiratory failure with hypercapnia (HCC)  Acute respiratory failure with hypoxia and hypercapnia: Resolved Acute metabolic versus toxic encephalopathy: Resolved Patient representing back to [facility] following recent discharge earlier in the evening to behavioral health hospital for further evaluation and treatment of his underlying psychosis secondary to schizophrenia.  Apparently, per nursing staff when patient was at the behavioral health hospital he was agitated and given sedatives to include Dilaudid  and Ativan.  Following administration of these medications, he became unresponsive and subsequently was transferred back to [facility].  On arrival to patient's room this morning, patient was noted to be obtunded on BiPAP.  No response to sternal rub and noted pinpoint pupils, likely secondary to drug overdose with benzodiazepines and narcotics.  Patient was given flumazenil 0.2 mg IV and Narcan 0.2 mg IV with mild improvement of his mentation.  This was followed by an additional dose of flumazenil 0.3 mg IV and Narcan 0.2 mg IV with resolution of his encephalopathy. --Titrated off of supplemental oxygen, doing well on room air --Avoid narcotics and benzos in this patient --Continue with supportive care --Continue one-to-one sitter   Schizophrenia with acute psychosis Patient initially presenting after being found with a gun and knife and subdued by [employer]  Per his roommate, he has not been sleeping recently and likely noncompliant with his home medications.  He has been deemed a danger to himself and others and has been recommended for inpatient behavioral health treatment.  Currently on involuntary commitment. --Continues with rambling and incoherent speech --Continue involuntary commitment, 1:1 safety sitter --continue Risperdal 2 mg every morning and 4 mg every afternoon. --Continued on trazodone 100 mg nightly --Haldol 2 mg IV every 6 hours as needed for agitation, has received 0 doses in the p24h --Psychiatry eval 08/14/2018, will need inpatient psychiatric hospitalization; case management for coordination, denied by [facility] and [facility]. No beds at [facility] hypertension --Continue metoprolol tartrate 25 mg p.o. daily  Type 2 diabetes mellitus Last hemoglobin A1c 6.6 on 04/06/2018.  Was previously on metformin but discontinued due to renal insufficiency; in favor of glyburide.  Review of blood sugars, well-controlled with consistent carbohydrate diet.   May not require oral hypoglycemics on discharge. --Hold oral hypoglycemics while inpatient --Consistent carbohydrate diet --Insulin sliding scale for coverage --CBG's qAC/HS  Cellulitis left lower extremity --Continue minocycline x7 days  Acute on CKD stage III: resolved Baseline creatinine between 1.6 - 2.0 over the past year.  Initially admitted with a creatinine of 2.36, likely secondary to prerenal failure secondary to dehydration and continuation of his home metformin. --Cr improved to 1.79 --Metformin discontinued --Avoid ACE inhibitors and NSAIDs  DVT prophylaxis: Lovenox subQ Code Status: Full Family Communication: Pt in room, family not at bedside Disposition Plan: Inpatient psych when bed available  Consultants:   Psych  Procedures:     Antimicrobials: Anti-infectives (From admission, onward)   Start     Dose/Rate Route Frequency Ordered Stop   08/12/18 1000  minocycline (MINOCIN) capsule 100 mg     100 mg Oral 2 times daily 08/12/18 0429         Subjective: Without complaints  Objective: Vitals:   08/15/18 2115 08/16/18 0502 08/16/18 1007 08/16/18 1011  BP: (!) 140/99 116/75 (!) 135/91 (!) 135/91  Pulse: 94 (!) 108 (!) 103 (!) 109  Resp: 15 17  18   Temp: 98.9 F (37.2 C) 98.6 F (37 C)  98.4 F (36.9 C)  TempSrc: Oral Oral  Oral  SpO2: 96% 94%  96%  Weight:        Intake/Output Summary (Last 24 hours) at 08/16/2018 1531 Last data filed at 08/16/2018 1400 Gross per 24 hour  Intake 1920 ml  Output -  Net 1920 ml   Filed Weights   08/12/18 0657 08/13/18 0726 08/15/18 0654  Weight: 117 kg 117.8 kg (!) 140 kg    Examination:  General exam: Appears calm and comfortable  Respiratory system: Clear to auscultation. Respiratory effort normal.  Data Reviewed: I have personally reviewed following labs and imaging studies  CBC: Recent Labs  Lab 08/10/18 0744 08/11/18 0233 08/11/18 2054 08/11/18 2222 08/12/18 0433 08/12/18 0440 08/13/18  0306  WBC 14.5* 10.3 10.1  --  12.3*  --  9.7  NEUTROABS 11.8*  --  7.5  --   --   --   --   HGB 13.3 12.5* 13.1 13.3 14.1 14.3 13.0  HCT 41.2 38.6* 41.4 39.0 44.6 42.0 40.2  MCV 88.2 89.1 90.8  --  92.3  --  89.5  PLT 503* 475* 477*  --  438*  --  409*   Basic Metabolic Panel: Recent Labs  Lab 08/10/18 1335 08/11/18 0233 08/11/18 2054 08/11/18 2222 08/12/18 0433 08/12/18 0440 08/13/18 0306  NA 136 137 138 141 140 140 133*  K 3.7 3.9 4.0 4.0 4.6 4.5 3.9  CL 103 105 105  --  103  --  99  CO2 22 23 21*  --  24  --  21*  GLUCOSE 119* 98 98  --  92  --  116*  BUN 12 14 18   --  17  --  17  CREATININE 2.11* 1.98* 2.11*  --  2.07*  --  1.79*  CALCIUM 9.1 8.9 9.2  --  9.1  --  8.9  MG  --   --   --   --   --   --  1.9   GFR: Estimated Creatinine Clearance: 66.8 mL/min (A) (by C-G formula based on SCr of 1.79 mg/dL (H)). Liver Function Tests: Recent Labs  Lab 08/11/18 2054 08/12/18 8119  AST 26 27  ALT 21 21  ALKPHOS 35* 42  BILITOT 0.6 0.3  PROT 6.7 6.9  ALBUMIN 3.5 3.5   No results for input(s): LIPASE, AMYLASE in the last 168 hours. Recent Labs  Lab 08/11/18 2054  AMMONIA 35   Coagulation Profile: No results for input(s): INR, PROTIME in the last 168 hours. Cardiac Enzymes: Recent Labs  Lab 08/11/18 2054  CKTOTAL 328  TROPONINI <0.03   BNP (last 3 results) No results for input(s): PROBNP in the last 8760 hours. HbA1C: No results for input(s): HGBA1C in the last 72 hours. CBG: Recent Labs  Lab 08/15/18 1150 08/15/18 1636 08/15/18 2216 08/16/18 0748 08/16/18 1123  GLUCAP 99 96 103* 106* 110*   Lipid Profile: No results for input(s): CHOL, HDL, LDLCALC, TRIG, CHOLHDL, LDLDIRECT in the last 72 hours. Thyroid Function Tests: No results for input(s): TSH, T4TOTAL, FREET4, T3FREE, THYROIDAB in the last 72 hours. Anemia Panel: No results for input(s): VITAMINB12, FOLATE, FERRITIN, TIBC, IRON, RETICCTPCT in the last 72 hours. Sepsis Labs: Recent Labs   Lab 08/11/18 2318  LATICACIDVEN 0.4*    Recent Results (from the past 240 hour(s))  SARS Coronavirus 2 (CEPHEID - Performed in [facility] hospital lab), Hosp Order     Status: None   Collection Time: 08/10/18  8:51 AM  Result Value Ref Range Status   SARS Coronavirus 2 NEGATIVE NEGATIVE Final    Comment: (NOTE) If result is NEGATIVE SARS-CoV-2 target nucleic acids are NOT DETECTED. The SARS-CoV-2 RNA is generally detectable in upper and lower  respiratory specimens during the acute phase of infection. The lowest  concentration of SARS-CoV-2 viral copies this assay can detect is 250  copies / mL. A negative result does not preclude SARS-CoV-2 infection  and should not be used as the sole basis for treatment or other  patient management decisions.  A negative result may occur with  improper specimen collection / handling, submission of specimen other  than nasopharyngeal swab, presence of viral mutation(s) within the  areas targeted by this assay, and inadequate number of viral copies  (<250 copies / mL). A negative result must be combined with clinical  observations, patient history, and epidemiological information. If result is POSITIVE SARS-CoV-2 target nucleic acids are DETECTED. The SARS-CoV-2 RNA is generally detectable in upper and lower  respiratory specimens dur ing the acute phase of infection.  Positive  results are indicative of active infection with SARS-CoV-2.  Clinical  correlation with patient history and other diagnostic information is  necessary to determine patient infection status.  Positive results do  not rule out bacterial infection or co-infection with other viruses. If result is PRESUMPTIVE POSTIVE SARS-CoV-2 nucleic acids MAY BE PRESENT.   A presumptive positive result was obtained on the submitted specimen  and confirmed on repeat testing.  While 2019 novel coronavirus  (SARS-CoV-2) nucleic acids may be present in the submitted sample  additional  confirmatory testing may be necessary for epidemiological  and / or clinical management purposes  to differentiate between  SARS-CoV-2 and other Sarbecovirus currently known to infect humans.  If clinically indicated additional testing with an alternate test  methodology [phone]) is advised. The SARS-CoV-2 RNA is generally  detectable in upper and lower respiratory sp ecimens during the acute  phase of infection. The expected result is Negative. Fact Sheet for Patients:  StrictlyIdeas.no Fact Sheet for Healthcare Providers: BankingDealers.co.za This test is not yet approved or cleared by the [place] FDA and has been authorized for  detection and/or diagnosis of SARS-CoV-2 by FDA under an Emergency Use Authorization (EUA).  This EUA will remain in effect (meaning this test can be used) for the duration of the COVID-19 declaration under Section 564(b)(1) of the Act, 21 U.S.C. section 360bbb-3(b)(1), unless the authorization is terminated or revoked sooner. Performed at [facility], [place] [address] [place], [place] [address]   Blood culture (routine x 2)     Status: None (Preliminary result)   Collection Time: 08/11/18 11:18 PM  Result Value Ref Range Status   Specimen Description BLOOD RIGHT HAND  Final   Special Requests   Final    BOTTLES DRAWN AEROBIC AND ANAEROBIC Blood Culture adequate volume   Culture   Final    NO GROWTH 4 DAYS Performed at [facility], [place] [address], [place], [place] [address]    Report Status PENDING  Incomplete  Blood culture (routine x 2)     Status: None (Preliminary result)   Collection Time: 08/11/18 11:50 PM  Result Value Ref Range Status   Specimen Description BLOOD RIGHT HAND  Final   Special Requests   Final    BOTTLES DRAWN AEROBIC AND ANAEROBIC Blood Culture adequate volume   Culture   Final    NO GROWTH 4 DAYS Performed at [facility], [place] [address],  [place], [place] [address]    Report Status PENDING  Incomplete     Radiology Studies: No results found.  Scheduled Meds: . aspirin EC  81 mg Oral Daily  . chlorhexidine  15 mL Mouth Rinse BID  . enoxaparin (LOVENOX) injection  40 mg Subcutaneous Q24H  . insulin aspart  0-5 Units Subcutaneous QHS  . insulin aspart  0-9 Units Subcutaneous TID WC  . mouth rinse  15 mL Mouth Rinse q12n4p  . metoprolol tartrate  25 mg Oral Daily  . minocycline  100 mg Oral BID  . mirtazapine  30 mg Oral QHS  . risperiDONE  2 mg Oral Daily  . risperiDONE  4 mg Oral QHS  . sodium chloride flush  3 mL Intravenous Q12H   Continuous Infusions: . sodium chloride       LOS: 4 days   Marylu Lund, MD [facility] Pager On Amion  If 7PM-7AM, please contact night-coverage 08/16/2018, 3:31 PM

## 2018-08-16 NOTE — Progress Notes (Signed)
[facility] does not have any beds available right now but are requesting updated labs in the event a bed opens up. CSW will request from MD.   CSW sent IVC renewal to Magistrate and called for patient to bed served. Paperwork on chart.   Percell Locus [place] LCSW [phone]

## 2018-08-16 NOTE — Progress Notes (Signed)
CSW checked in with [facility]. They reported that they called to ask about the patient last night and were told that he no longer needed psych placement. They requested to re-send the referral. CSW faxed it and made sure she had the correct phone number.   Percell Locus [place] LCSW [phone]

## 2018-08-16 NOTE — Progress Notes (Signed)
Pt eating breakfast tray 

## 2018-08-17 LAB — CULTURE, BLOOD (ROUTINE X 2)
Culture: NO GROWTH
Culture: NO GROWTH
Special Requests: ADEQUATE
Special Requests: ADEQUATE

## 2018-08-17 LAB — CBC
HCT: 44.4 % (ref 39.0–52.0)
Hemoglobin: 14.3 g/dL (ref 13.0–17.0)
MCH: 28.4 pg (ref 26.0–34.0)
MCHC: 32.2 g/dL (ref 30.0–36.0)
MCV: 88.3 fL (ref 80.0–100.0)
Platelets: 554 10*3/uL — ABNORMAL HIGH (ref 150–400)
RBC: 5.03 MIL/uL (ref 4.22–5.81)
RDW: 13.4 % (ref 11.5–15.5)
WBC: 9.5 10*3/uL (ref 4.0–10.5)
nRBC: 0 % (ref 0.0–0.2)

## 2018-08-17 LAB — COMPREHENSIVE METABOLIC PANEL
ALT: 23 U/L (ref 0–44)
AST: 22 U/L (ref 15–41)
Albumin: 3.9 g/dL (ref 3.5–5.0)
Alkaline Phosphatase: 49 U/L (ref 38–126)
Anion gap: 14 (ref 5–15)
BUN: 25 mg/dL — ABNORMAL HIGH (ref 6–20)
CO2: 21 mmol/L — ABNORMAL LOW (ref 22–32)
Calcium: 10.1 mg/dL (ref 8.9–10.3)
Chloride: 100 mmol/L (ref 98–111)
Creatinine, Ser: 1.99 mg/dL — ABNORMAL HIGH (ref 0.61–1.24)
GFR calc Af Amer: 44 mL/min — ABNORMAL LOW (ref >60)
GFR calc non Af Amer: 38 mL/min — ABNORMAL LOW (ref >60)
Glucose, Bld: 100 mg/dL — ABNORMAL HIGH (ref 70–99)
Potassium: 3.7 mmol/L (ref 3.5–5.1)
Sodium: 135 mmol/L (ref 135–145)
Total Bilirubin: 0.6 mg/dL (ref 0.3–1.2)
Total Protein: 7.4 g/dL (ref 6.5–8.1)

## 2018-08-17 LAB — GLUCOSE, CAPILLARY
Glucose-Capillary: 105 mg/dL — ABNORMAL HIGH (ref 70–99)
Glucose-Capillary: 117 mg/dL — ABNORMAL HIGH (ref 70–99)
Glucose-Capillary: 103 mg/dL — ABNORMAL HIGH (ref 70–99)
Glucose-Capillary: 107 mg/dL — ABNORMAL HIGH (ref 70–99)

## 2018-08-17 NOTE — Progress Notes (Signed)
PROGRESS NOTE    [name] [name].  PZW:[id number] DOB: Sep 08, 1965 DOA: 08/12/2018 PCP: Ann Held, DO    Brief Narrative:  53 y.o.malewith medical history significant ofDM; HTN; HLD; and schizophrenia presenting with Psychosis. Caretaker reported that he had not slept since Sunday. Patient was found to have a gun that he was pointing around in the lobby and also a knife, and [employer] took him into custody. The patient was unable to provide history at the time of admission. He was given sedatives prior to my evaluation and was obtunded.  Psych eval - increasingly agitated per caregiver. IVC done after gun and knife in waiting room. AKI - given bolus. Will recheck BMP and if normalizing can go to inpatient [facility] today. If not normalizing, can observe overnight for IV hydration.  Patient was discharged to behavioral health hospital on 08/11/2018 for further psychiatric care.  Apparently when patient arrived he was agitated and given Dilaudid and Ativan per nursing staff.  He became unresponsive and was transferred back to [facility] due to his current mental status.  Assessment & Plan:   Principal Problem:   Schizophrenia ([place]) Active Problems:   HTN (hypertension)   Renal insufficiency   Schizophrenia, disorganized type ([place])   DM (diabetes mellitus) type II uncontrolled, periph vascular disorder (HCC)   Schizoaffective disorder, bipolar type ([place])   Acute respiratory failure with hypercapnia (HCC)  Acute respiratory failure with hypoxia and hypercapnia: Resolved Acute metabolic versus toxic encephalopathy: Resolved Patient representing back to [facility] following recent discharge earlier in the evening to behavioral health hospital for further evaluation and treatment of his underlying psychosis secondary to schizophrenia.  Apparently, per nursing staff when patient was at the behavioral health hospital he was agitated and given sedatives to include Dilaudid  and Ativan.  Following administration of these medications, he became unresponsive and subsequently was transferred back to [facility].  On arrival to patient's room this morning, patient was noted to be obtunded on BiPAP.  No response to sternal rub and noted pinpoint pupils, likely secondary to drug overdose with benzodiazepines and narcotics.  Patient was given flumazenil 0.2 mg IV and Narcan 0.2 mg IV with mild improvement of his mentation.  This was followed by an additional dose of flumazenil 0.3 mg IV and Narcan 0.2 mg IV with resolution of his encephalopathy. --Titrated off of supplemental oxygen, doing well on room air --Avoid narcotics and benzos in this patient --Continue with supportive care --Continue one-to-one sitter  --Ambulating ans stable at present  Schizophrenia with acute psychosis Patient initially presenting after being found with a gun and knife and subdued by [employer]  Per his roommate, he has not been sleeping recently and likely noncompliant with his home medications.  He has been deemed a danger to himself and others and has been recommended for inpatient behavioral health treatment.  Currently on involuntary commitment. --Continues with rambling and incoherent speech --Continue involuntary commitment, 1:1 safety sitter --continue Risperdal 2 mg every morning and 4 mg every afternoon. --Continued on trazodone 100 mg nightly --Haldol 2 mg IV every 6 hours as needed for agitation, has received 0 doses in the p24h --Psychiatry eval 08/14/2018, will need inpatient psychiatric hospitalization; case management for coordination, denied by [facility] and [facility]. No beds at [place] hypertension --Continue metoprolol tartrate 25 mg p.o. daily  Type 2 diabetes mellitus Last hemoglobin A1c 6.6 on 04/06/2018.  Was previously on metformin but discontinued due to renal insufficiency;  in favor of glyburide.  Review of blood sugars, well-controlled  with consistent carbohydrate diet.  May not require oral hypoglycemics on discharge. --Hold oral hypoglycemics while inpatient --Consistent carbohydrate diet --Insulin sliding scale for coverage --CBG's qAC/HS  Cellulitis left lower extremity --Continue minocycline x7 days  Acute on CKD stage III: resolved Baseline creatinine between 1.6 - 2.0 over the past year.  Initially admitted with a creatinine of 2.36, likely secondary to prerenal failure secondary to dehydration and continuation of his home metformin. --Cr improved to 1.79 --Metformin discontinued --Avoid ACE inhibitors and NSAIDs  DVT prophylaxis: Lovenox subQ Code Status: Full Family Communication: Pt in room, family not at bedside Disposition Plan: Inpatient psych when bed available  Consultants:   Psych  Procedures:     Antimicrobials: Anti-infectives (From admission, onward)   Start     Dose/Rate Route Frequency Ordered Stop   08/12/18 1000  minocycline (MINOCIN) capsule 100 mg     100 mg Oral 2 times daily 08/12/18 0429        Subjective: No complaints  Objective: Vitals:   08/17/18 0553 08/17/18 0913 08/17/18 0922 08/17/18 1424  BP:  135/72 104/70 95/69  Pulse:  95 (!) 104 64  Resp:   20 20  Temp:   98.4 F (36.9 C) 98.5 F (36.9 C)  TempSrc:   Oral Oral  SpO2:   92% 93%  Weight: 112.8 kg       Intake/Output Summary (Last 24 hours) at 08/17/2018 1601 Last data filed at 08/17/2018 1300 Gross per 24 hour  Intake 960 ml  Output -  Net 960 ml   Filed Weights   08/13/18 0726 08/15/18 0654 08/17/18 0553  Weight: 117.8 kg (!) 140 kg 112.8 kg    Examination: General exam: Awake, sitting in bed, in nad Respiratory system: Normal respiratory effort, no wheezing  Data Reviewed: I have personally reviewed following labs and imaging studies  CBC: Recent Labs  Lab 08/11/18 0233 08/11/18 2054 08/11/18 2222 08/12/18 0433 08/12/18 0440 08/13/18 0306 08/17/18 0412  WBC 10.3 10.1  --  12.3*   --  9.7 9.5  NEUTROABS  --  7.5  --   --   --   --   --   HGB 12.5* 13.1 13.3 14.1 14.3 13.0 14.3  HCT 38.6* 41.4 39.0 44.6 42.0 40.2 44.4  MCV 89.1 90.8  --  92.3  --  89.5 88.3  PLT 475* 477*  --  438*  --  459* 811*   Basic Metabolic Panel: Recent Labs  Lab 08/11/18 0233 08/11/18 2054 08/11/18 2222 08/12/18 0433 08/12/18 0440 08/13/18 0306 08/17/18 0412  NA 137 138 141 140 140 133* 135  K 3.9 4.0 4.0 4.6 4.5 3.9 3.7  CL 105 105  --  103  --  99 100  CO2 23 21*  --  24  --  21* 21*  GLUCOSE 98 98  --  92  --  116* 100*  BUN 14 18  --  17  --  17 25*  CREATININE 1.98* 2.11*  --  2.07*  --  1.79* 1.99*  CALCIUM 8.9 9.2  --  9.1  --  8.9 10.1  MG  --   --   --   --   --  1.9  --    GFR: Estimated Creatinine Clearance: 53.3 mL/min (A) (by C-G formula based on SCr of 1.99 mg/dL (H)). Liver Function Tests: Recent Labs  Lab 08/11/18 2054 08/12/18 9147 08/17/18 8295  AST 26 27 22   ALT 21 21 23   ALKPHOS 35* 42 49  BILITOT 0.6 0.3 0.6  PROT 6.7 6.9 7.4  ALBUMIN 3.5 3.5 3.9   No results for input(s): LIPASE, AMYLASE in the last 168 hours. Recent Labs  Lab 08/11/18 2054  AMMONIA 35   Coagulation Profile: No results for input(s): INR, PROTIME in the last 168 hours. Cardiac Enzymes: Recent Labs  Lab 08/11/18 2054  CKTOTAL 328  TROPONINI <0.03   BNP (last 3 results) No results for input(s): PROBNP in the last 8760 hours. HbA1C: No results for input(s): HGBA1C in the last 72 hours. CBG: Recent Labs  Lab 08/16/18 1123 08/16/18 1653 08/16/18 2140 08/17/18 0756 08/17/18 1213  GLUCAP 110* 119* 101* 105* 117*   Lipid Profile: No results for input(s): CHOL, HDL, LDLCALC, TRIG, CHOLHDL, LDLDIRECT in the last 72 hours. Thyroid Function Tests: No results for input(s): TSH, T4TOTAL, FREET4, T3FREE, THYROIDAB in the last 72 hours. Anemia Panel: No results for input(s): VITAMINB12, FOLATE, FERRITIN, TIBC, IRON, RETICCTPCT in the last 72 hours. Sepsis Labs: Recent  Labs  Lab 08/11/18 2318  LATICACIDVEN 0.4*    Recent Results (from the past 240 hour(s))  SARS Coronavirus 2 (CEPHEID - Performed in [place] hospital lab), Hosp Order     Status: None   Collection Time: 08/10/18  8:51 AM  Result Value Ref Range Status   SARS Coronavirus 2 NEGATIVE NEGATIVE Final    Comment: (NOTE) If result is NEGATIVE SARS-CoV-2 target nucleic acids are NOT DETECTED. The SARS-CoV-2 RNA is generally detectable in upper and lower  respiratory specimens during the acute phase of infection. The lowest  concentration of SARS-CoV-2 viral copies this assay can detect is 250  copies / mL. A negative result does not preclude SARS-CoV-2 infection  and should not be used as the sole basis for treatment or other  patient management decisions.  A negative result may occur with  improper specimen collection / handling, submission of specimen other  than nasopharyngeal swab, presence of viral mutation(s) within the  areas targeted by this assay, and inadequate number of viral copies  (<250 copies / mL). A negative result must be combined with clinical  observations, patient history, and epidemiological information. If result is POSITIVE SARS-CoV-2 target nucleic acids are DETECTED. The SARS-CoV-2 RNA is generally detectable in upper and lower  respiratory specimens dur ing the acute phase of infection.  Positive  results are indicative of active infection with SARS-CoV-2.  Clinical  correlation with patient history and other diagnostic information is  necessary to determine patient infection status.  Positive results do  not rule out bacterial infection or co-infection with other viruses. If result is PRESUMPTIVE POSTIVE SARS-CoV-2 nucleic acids MAY BE PRESENT.   A presumptive positive result was obtained on the submitted specimen  and confirmed on repeat testing.  While 2019 novel coronavirus  (SARS-CoV-2) nucleic acids may be present in the submitted sample  additional  confirmatory testing may be necessary for epidemiological  and / or clinical management purposes  to differentiate between  SARS-CoV-2 and other Sarbecovirus currently known to infect humans.  If clinically indicated additional testing with an alternate test  methodology [phone]) is advised. The SARS-CoV-2 RNA is generally  detectable in upper and lower respiratory sp ecimens during the acute  phase of infection. The expected result is Negative. Fact Sheet for Patients:  StrictlyIdeas.no Fact Sheet for Healthcare Providers: BankingDealers.co.za This test is not yet approved or cleared by the [place]  FDA and has been authorized for detection and/or diagnosis of SARS-CoV-2 by FDA under an Emergency Use Authorization (EUA).  This EUA will remain in effect (meaning this test can be used) for the duration of the COVID-19 declaration under Section 564(b)(1) of the Act, 21 U.S.C. section 360bbb-3(b)(1), unless the authorization is terminated or revoked sooner. Performed at [facility], [place] [address], [place], [place] [address]   Blood culture (routine x 2)     Status: None   Collection Time: 08/11/18 11:18 PM  Result Value Ref Range Status   Specimen Description BLOOD RIGHT HAND  Final   Special Requests   Final    BOTTLES DRAWN AEROBIC AND ANAEROBIC Blood Culture adequate volume   Culture   Final    NO GROWTH 5 DAYS Performed at [facility], [place] [address], [place], [place] [address]    Report Status 08/17/2018 FINAL  Final  Blood culture (routine x 2)     Status: None   Collection Time: 08/11/18 11:50 PM  Result Value Ref Range Status   Specimen Description BLOOD RIGHT HAND  Final   Special Requests   Final    BOTTLES DRAWN AEROBIC AND ANAEROBIC Blood Culture adequate volume   Culture   Final    NO GROWTH 5 DAYS Performed at [facility], [place] [address] [place], [place] [address]    Report Status  08/17/2018 FINAL  Final     Radiology Studies: No results found.  Scheduled Meds: . aspirin EC  81 mg Oral Daily  . chlorhexidine  15 mL Mouth Rinse BID  . enoxaparin (LOVENOX) injection  40 mg Subcutaneous Q24H  . insulin aspart  0-5 Units Subcutaneous QHS  . insulin aspart  0-9 Units Subcutaneous TID WC  . mouth rinse  15 mL Mouth Rinse q12n4p  . metoprolol tartrate  25 mg Oral Daily  . minocycline  100 mg Oral BID  . mirtazapine  30 mg Oral QHS  . risperiDONE  2 mg Oral Daily  . risperiDONE  4 mg Oral QHS  . sodium chloride flush  3 mL Intravenous Q12H   Continuous Infusions: . sodium chloride       LOS: 5 days   Marylu Lund, MD [facility] Pager On Amion  If 7PM-7AM, please contact night-coverage 08/17/2018, 4:01 PM

## 2018-08-17 NOTE — Progress Notes (Signed)
Pt refusing oral mouth rinses. Provider Wyline Copas, made aware of pt's lack of IV access. Provider okay with leaving access out. Will continue to monitor pt.

## 2018-08-17 NOTE — Plan of Care (Signed)
  Problem: Clinical Measurements: Goal: Ability to maintain clinical measurements within normal limits will improve Outcome: Progressing   Problem: Education: Goal: Knowledge of General Education information will improve Description Including pain rating scale, medication(s)/side effects and non-pharmacologic comfort measures Outcome: Not Progressing

## 2018-08-18 LAB — GLUCOSE, CAPILLARY
Glucose-Capillary: 160 mg/dL — ABNORMAL HIGH (ref 70–99)
Glucose-Capillary: 111 mg/dL — ABNORMAL HIGH (ref 70–99)
Glucose-Capillary: 118 mg/dL — ABNORMAL HIGH (ref 70–99)
Glucose-Capillary: 114 mg/dL — ABNORMAL HIGH (ref 70–99)

## 2018-08-18 MED ORDER — BISACODYL 5 MG PO TBEC
10.0000 mg | DELAYED_RELEASE_TABLET | Freq: Once | ORAL | Status: AC
  Administered 2018-08-19: 10 mg via ORAL
  Filled 2018-08-18: qty 2

## 2018-08-18 MED ORDER — HALOPERIDOL 2 MG PO TABS
2.0000 mg | ORAL_TABLET | Freq: Three times a day (TID) | ORAL | Status: DC | PRN
  Administered 2018-08-18 – 2018-08-19 (×2): 2 mg via ORAL
  Filled 2018-08-18 (×4): qty 1

## 2018-08-18 NOTE — Progress Notes (Signed)
PROGRESS NOTE    [name] [name].  SEG:[id number] DOB: 1966-01-19 DOA: 08/12/2018 PCP: Ann Held, DO    Brief Narrative:  53 y.o.malewith medical history significant ofDM; HTN; HLD; and schizophrenia presenting with Psychosis. Caretaker reported that he had not slept since Sunday. Patient was found to have a gun that he was pointing around in the lobby and also a knife, and [employer] took him into custody. The patient was unable to provide history at the time of admission. He was given sedatives prior to my evaluation and was obtunded.  Psych eval - increasingly agitated per caregiver. IVC done after gun and knife in waiting room. AKI - given bolus. Will recheck BMP and if normalizing can go to inpatient [facility] today. If not normalizing, can observe overnight for IV hydration.  Patient was discharged to behavioral health hospital on 08/11/2018 for further psychiatric care.  Apparently when patient arrived he was agitated and given Dilaudid and Ativan per nursing staff.  He became unresponsive and was transferred back to [facility] due to his current mental status.  Assessment & Plan:   Principal Problem:   Schizophrenia ([place]) Active Problems:   HTN (hypertension)   Renal insufficiency   Schizophrenia, disorganized type ([place])   DM (diabetes mellitus) type II uncontrolled, periph vascular disorder (HCC)   Schizoaffective disorder, bipolar type ([place])   Acute respiratory failure with hypercapnia (HCC)  Acute respiratory failure with hypoxia and hypercapnia: Resolved Acute metabolic versus toxic encephalopathy: Resolved Patient representing back to [facility] following recent discharge earlier in the evening to behavioral health hospital for further evaluation and treatment of his underlying psychosis secondary to schizophrenia.  Apparently, per nursing staff when patient was at the behavioral health hospital he was agitated and given sedatives to include Dilaudid  and Ativan.  Following administration of these medications, he became unresponsive and subsequently was transferred back to [facility].  On arrival to patient's room this morning, patient was noted to be obtunded on BiPAP.  No response to sternal rub and noted pinpoint pupils, likely secondary to drug overdose with benzodiazepines and narcotics.  Patient was given flumazenil 0.2 mg IV and Narcan 0.2 mg IV with mild improvement of his mentation.  This was followed by an additional dose of flumazenil 0.3 mg IV and Narcan 0.2 mg IV with resolution of his encephalopathy. --Titrated off of supplemental oxygen, doing well on room air --Avoid narcotics and benzos in this patient --Continue with supportive care --Continue one-to-one sitter  --Patient ambulating without difficulty  Schizophrenia with acute psychosis Patient initially presenting after being found with a gun and knife and subdued by [employer]  Per his roommate, he has not been sleeping recently and likely noncompliant with his home medications.  He has been deemed a danger to himself and others and has been recommended for inpatient behavioral health treatment.  Currently on involuntary commitment. --Continues with rambling and incoherent speech --Continue involuntary commitment, 1:1 safety sitter --continue Risperdal 2 mg every morning and 4 mg every afternoon. --Continued on trazodone 100 mg nightly --Haldol 2 mg IV every 6 hours as needed for agitation, has received 0 doses in the p24h --Psychiatry eval 08/14/2018, will need inpatient psychiatric hospitalization; case management for coordination, denied by [facility] and [facility]. No beds at [place] hypertension --Continue metoprolol tartrate 25 mg p.o. daily  Type 2 diabetes mellitus Last hemoglobin A1c 6.6 on 04/06/2018.  Was previously on metformin but discontinued due to renal insufficiency; in  favor of glyburide.  Review of blood sugars,  well-controlled with consistent carbohydrate diet.  May not require oral hypoglycemics on discharge. --Hold oral hypoglycemics while inpatient --Consistent carbohydrate diet --Insulin sliding scale for coverage --CBG's qAC/HS  Cellulitis left lower extremity --Continue minocycline x7 days  Acute on CKD stage III: resolved Baseline creatinine between 1.6 - 2.0 over the past year.  Initially admitted with a creatinine of 2.36, likely secondary to prerenal failure secondary to dehydration and continuation of his home metformin. --Cr improved to 1.79 --Metformin discontinued --Avoid ACE inhibitors and NSAIDs  DVT prophylaxis: Lovenox subQ Code Status: Full Family Communication: Pt in room, family not at bedside Disposition Plan: Inpatient psych when bed available  Consultants:   Psych  Procedures:     Antimicrobials: Anti-infectives (From admission, onward)   Start     Dose/Rate Route Frequency Ordered Stop   08/12/18 1000  minocycline (MINOCIN) capsule 100 mg     100 mg Oral 2 times daily 08/12/18 0429        Subjective: Without complaints  Objective: Vitals:   08/17/18 0922 08/17/18 1424 08/17/18 2111 08/18/18 0903  BP: 104/70 95/69 107/74 (!) 120/93  Pulse: (!) 104 64 90 94  Resp: 20 20    Temp: 98.4 F (36.9 C) 98.5 F (36.9 C) 99 F (37.2 C)   TempSrc: Oral Oral Oral   SpO2: 92% 93% 97%   Weight:        Intake/Output Summary (Last 24 hours) at 08/18/2018 1057 Last data filed at 08/18/2018 0827 Gross per 24 hour  Intake 960 ml  Output -  Net 960 ml   Filed Weights   08/13/18 0726 08/15/18 0654 08/17/18 0553  Weight: 117.8 kg (!) 140 kg 112.8 kg    Examination: General exam: Conversant, in no acute distress Respiratory system: normal chest rise, clear, no audible wheezing  Data Reviewed: I have personally reviewed following labs and imaging studies  CBC: Recent Labs  Lab 08/11/18 2054 08/11/18 2222 08/12/18 0433 08/12/18 0440 08/13/18 0306  08/17/18 0412  WBC 10.1  --  12.3*  --  9.7 9.5  NEUTROABS 7.5  --   --   --   --   --   HGB 13.1 13.3 14.1 14.3 13.0 14.3  HCT 41.4 39.0 44.6 42.0 40.2 44.4  MCV 90.8  --  92.3  --  89.5 88.3  PLT 477*  --  438*  --  459* 478*   Basic Metabolic Panel: Recent Labs  Lab 08/11/18 2054 08/11/18 2222 08/12/18 0433 08/12/18 0440 08/13/18 0306 08/17/18 0412  NA 138 141 140 140 133* 135  K 4.0 4.0 4.6 4.5 3.9 3.7  CL 105  --  103  --  99 100  CO2 21*  --  24  --  21* 21*  GLUCOSE 98  --  92  --  116* 100*  BUN 18  --  17  --  17 25*  CREATININE 2.11*  --  2.07*  --  1.79* 1.99*  CALCIUM 9.2  --  9.1  --  8.9 10.1  MG  --   --   --   --  1.9  --    GFR: Estimated Creatinine Clearance: 53.3 mL/min (A) (by C-G formula based on SCr of 1.99 mg/dL (H)). Liver Function Tests: Recent Labs  Lab 08/11/18 2054 08/12/18 0433 08/17/18 0412  AST 26 27 22   ALT 21 21 23   ALKPHOS 35* 42 49  BILITOT 0.6 0.3 0.6  PROT 6.7 6.9 7.4  ALBUMIN 3.5 3.5 3.9   No results for input(s): LIPASE, AMYLASE in the last 168 hours. Recent Labs  Lab 08/11/18 2054  AMMONIA 35   Coagulation Profile: No results for input(s): INR, PROTIME in the last 168 hours. Cardiac Enzymes: Recent Labs  Lab 08/11/18 2054  CKTOTAL 328  TROPONINI <0.03   BNP (last 3 results) No results for input(s): PROBNP in the last 8760 hours. HbA1C: No results for input(s): HGBA1C in the last 72 hours. CBG: Recent Labs  Lab 08/17/18 0756 08/17/18 1213 08/17/18 1646 08/17/18 2106 08/18/18 0839  GLUCAP 105* 117* 103* 107* 160*   Lipid Profile: No results for input(s): CHOL, HDL, LDLCALC, TRIG, CHOLHDL, LDLDIRECT in the last 72 hours. Thyroid Function Tests: No results for input(s): TSH, T4TOTAL, FREET4, T3FREE, THYROIDAB in the last 72 hours. Anemia Panel: No results for input(s): VITAMINB12, FOLATE, FERRITIN, TIBC, IRON, RETICCTPCT in the last 72 hours. Sepsis Labs: Recent Labs  Lab 08/11/18 2318  LATICACIDVEN  0.4*    Recent Results (from the past 240 hour(s))  SARS Coronavirus 2 (CEPHEID - Performed in [facility] hospital lab), Hosp Order     Status: None   Collection Time: 08/10/18  8:51 AM  Result Value Ref Range Status   SARS Coronavirus 2 NEGATIVE NEGATIVE Final    Comment: (NOTE) If result is NEGATIVE SARS-CoV-2 target nucleic acids are NOT DETECTED. The SARS-CoV-2 RNA is generally detectable in upper and lower  respiratory specimens during the acute phase of infection. The lowest  concentration of SARS-CoV-2 viral copies this assay can detect is 250  copies / mL. A negative result does not preclude SARS-CoV-2 infection  and should not be used as the sole basis for treatment or other  patient management decisions.  A negative result may occur with  improper specimen collection / handling, submission of specimen other  than nasopharyngeal swab, presence of viral mutation(s) within the  areas targeted by this assay, and inadequate number of viral copies  (<250 copies / mL). A negative result must be combined with clinical  observations, patient history, and epidemiological information. If result is POSITIVE SARS-CoV-2 target nucleic acids are DETECTED. The SARS-CoV-2 RNA is generally detectable in upper and lower  respiratory specimens dur ing the acute phase of infection.  Positive  results are indicative of active infection with SARS-CoV-2.  Clinical  correlation with patient history and other diagnostic information is  necessary to determine patient infection status.  Positive results do  not rule out bacterial infection or co-infection with other viruses. If result is PRESUMPTIVE POSTIVE SARS-CoV-2 nucleic acids MAY BE PRESENT.   A presumptive positive result was obtained on the submitted specimen  and confirmed on repeat testing.  While 2019 novel coronavirus  (SARS-CoV-2) nucleic acids may be present in the submitted sample  additional confirmatory testing may be necessary  for epidemiological  and / or clinical management purposes  to differentiate between  SARS-CoV-2 and other Sarbecovirus currently known to infect humans.  If clinically indicated additional testing with an alternate test  methodology [phone]) is advised. The SARS-CoV-2 RNA is generally  detectable in upper and lower respiratory sp ecimens during the acute  phase of infection. The expected result is Negative. Fact Sheet for Patients:  StrictlyIdeas.no Fact Sheet for Healthcare Providers: BankingDealers.co.za This test is not yet approved or cleared by the [place] FDA and has been authorized for detection and/or diagnosis of SARS-CoV-2 by FDA under an Emergency Use Authorization (EUA).  This EUA will remain in effect (meaning this test can be used) for the duration of the COVID-19 declaration under Section 564(b)(1) of the Act, 21 U.S.C. section 360bbb-3(b)(1), unless the authorization is terminated or revoked sooner. Performed at [facility], [place] [address], [place], [place] [address]   Blood culture (routine x 2)     Status: None   Collection Time: 08/11/18 11:18 PM  Result Value Ref Range Status   Specimen Description BLOOD RIGHT HAND  Final   Special Requests   Final    BOTTLES DRAWN AEROBIC AND ANAEROBIC Blood Culture adequate volume   Culture   Final    NO GROWTH 5 DAYS Performed at [facility], [place] [address] [place], [place] [address]    Report Status 08/17/2018 FINAL  Final  Blood culture (routine x 2)     Status: None   Collection Time: 08/11/18 11:50 PM  Result Value Ref Range Status   Specimen Description BLOOD RIGHT HAND  Final   Special Requests   Final    BOTTLES DRAWN AEROBIC AND ANAEROBIC Blood Culture adequate volume   Culture   Final    NO GROWTH 5 DAYS Performed at [facility], [place] [address], [place], [place] [address]    Report Status 08/17/2018 FINAL  Final     Radiology  Studies: No results found.  Scheduled Meds: . aspirin EC  81 mg Oral Daily  . chlorhexidine  15 mL Mouth Rinse BID  . enoxaparin (LOVENOX) injection  40 mg Subcutaneous Q24H  . insulin aspart  0-5 Units Subcutaneous QHS  . insulin aspart  0-9 Units Subcutaneous TID WC  . mouth rinse  15 mL Mouth Rinse q12n4p  . metoprolol tartrate  25 mg Oral Daily  . minocycline  100 mg Oral BID  . mirtazapine  30 mg Oral QHS  . risperiDONE  2 mg Oral Daily  . risperiDONE  4 mg Oral QHS  . sodium chloride flush  3 mL Intravenous Q12H   Continuous Infusions: . sodium chloride       LOS: 6 days   Marylu Lund, MD [facility] Pager On Amion  If 7PM-7AM, please contact night-coverage 08/18/2018, 10:57 AM

## 2018-08-18 NOTE — Progress Notes (Signed)
CSW faxed the patient out to the following:   [facility] (f): [phone] [facility] (f): [facility] (f): [facility] (f): [facility] (f): [phone] [facility] (f): [phone] [facility] (f): [phone] [facility] (f): [phone] [facility] (f): [phone] [facility] (f): [phone] [facility] (f): [phone]  CSW will continue to assist in finding a psych placement for the patient.   Domenic Schwab, MSW, [place]

## 2018-08-18 NOTE — Care Management Important Message (Signed)
Important Message  Patient Details  Name: [name] [name]. MRN: [id number] Date of Birth: July 11, 1965   Medicare Important Message Given:  Yes    Memory Argue 08/18/2018, 3:29 PM

## 2018-08-19 LAB — CREATININE, SERUM
Creatinine, Ser: 2.16 mg/dL — ABNORMAL HIGH (ref 0.61–1.24)
GFR calc Af Amer: 40 mL/min — ABNORMAL LOW (ref >60)
GFR calc non Af Amer: 34 mL/min — ABNORMAL LOW (ref >60)

## 2018-08-19 LAB — GLUCOSE, CAPILLARY
Glucose-Capillary: 108 mg/dL — ABNORMAL HIGH (ref 70–99)
Glucose-Capillary: 106 mg/dL — ABNORMAL HIGH (ref 70–99)
Glucose-Capillary: 92 mg/dL (ref 70–99)
Glucose-Capillary: 86 mg/dL (ref 70–99)
Glucose-Capillary: 103 mg/dL — ABNORMAL HIGH (ref 70–99)

## 2018-08-19 NOTE — Progress Notes (Signed)
PROGRESS NOTE    [name] [name].  QIW:[id number] DOB: May 17, 1965 DOA: 08/12/2018 PCP: Ann Held, DO    Brief Narrative:  53 y.o.malewith medical history significant ofDM; HTN; HLD; and schizophrenia presenting with Psychosis. Caretaker reported that he had not slept since Sunday. Patient was found to have a gun that he was pointing around in the lobby and also a knife, and [employer] took him into custody. The patient was unable to provide history at the time of admission. He was given sedatives prior to my evaluation and was obtunded.  Psych eval - increasingly agitated per caregiver. IVC done after gun and knife in waiting room. AKI - given bolus. Will recheck BMP and if normalizing can go to inpatient [facility] today. If not normalizing, can observe overnight for IV hydration.  Patient was discharged to behavioral health hospital on 08/11/2018 for further psychiatric care.  Apparently when patient arrived he was agitated and given Dilaudid and Ativan per nursing staff.  He became unresponsive and was transferred back to [facility] due to his current mental status.  Assessment & Plan:   Principal Problem:   Schizophrenia ([place]) Active Problems:   HTN (hypertension)   Renal insufficiency   Schizophrenia, disorganized type ([place])   DM (diabetes mellitus) type II uncontrolled, periph vascular disorder (HCC)   Schizoaffective disorder, bipolar type ([place])   Acute respiratory failure with hypercapnia (HCC)  Acute respiratory failure with hypoxia and hypercapnia: Resolved Acute metabolic versus toxic encephalopathy: Resolved Patient representing back to [facility] following recent discharge earlier in the evening to behavioral health hospital for further evaluation and treatment of his underlying psychosis secondary to schizophrenia.  Apparently, per nursing staff when patient was at the behavioral health hospital he was agitated and given sedatives to include Dilaudid  and Ativan.  Following administration of these medications, he became unresponsive and subsequently was transferred back to [facility]  On arrival to patient's room this morning, patient was noted to be obtunded on BiPAP.  No response to sternal rub and noted pinpoint pupils, likely secondary to drug overdose with benzodiazepines and narcotics.  Patient was given flumazenil 0.2 mg IV and Narcan 0.2 mg IV with mild improvement of his mentation.  This was followed by an additional dose of flumazenil 0.3 mg IV and Narcan 0.2 mg IV with resolution of his encephalopathy. --Titrated off of supplemental oxygen, doing well on room air --Avoid narcotics and benzos in this patient --Continue with supportive care --Continue one-to-one sitter  --Patient ambulating with no difficulty  Schizophrenia with acute psychosis Patient initially presenting after being found with a gun and knife and subdued by [employer]  Per his roommate, he has not been sleeping recently and likely noncompliant with his home medications.  He has been deemed a danger to himself and others and has been recommended for inpatient behavioral health treatment.  Currently on involuntary commitment. --Continues with rambling and incoherent speech --Continue involuntary commitment, 1:1 safety sitter --continue Risperdal 2 mg every morning and 4 mg every afternoon. --Continued on trazodone 100 mg nightly --Haldol 2 mg IV every 6 hours as needed for agitation, has received 0 doses in the p24h --Psychiatry eval 08/14/2018, will need inpatient psychiatric hospitalization; case management for coordination, denied by [facility] and [facility]. No beds at [place] hypertension --Continue metoprolol tartrate 25 mg p.o. daily  Type 2 diabetes mellitus Last hemoglobin A1c 6.6 on 04/06/2018.  Was previously on metformin but discontinued due to renal insufficiency;  in favor of glyburide.  Review of blood sugars,  well-controlled with consistent carbohydrate diet.  May not require oral hypoglycemics on discharge. --Hold oral hypoglycemics while inpatient --Consistent carbohydrate diet --Insulin sliding scale for coverage --CBG's qAC/HS  Cellulitis left lower extremity --Continue minocycline x7 days  Acute on CKD stage III: resolved Baseline creatinine between 1.6 - 2.0 over the past year.  Initially admitted with a creatinine of 2.36, likely secondary to prerenal failure secondary to dehydration and continuation of his home metformin. --Cr improved to 1.79 --Metformin discontinued --Avoid ACE inhibitors and NSAIDs  DVT prophylaxis: Lovenox subQ Code Status: Full Family Communication: Pt in room, family not at bedside Disposition Plan: Inpatient psych when bed available  Consultants:   Psych  Procedures:     Antimicrobials: Anti-infectives (From admission, onward)   Start     Dose/Rate Route Frequency Ordered Stop   08/12/18 1000  minocycline (MINOCIN) capsule 100 mg     100 mg Oral 2 times daily 08/12/18 0429        Subjective: No complaints at this time  Objective: Vitals:   08/18/18 2112 08/19/18 0444 08/19/18 0700 08/19/18 1355  BP: 128/78 128/78  120/78  Pulse: 88 (!) 106  90  Resp: 18   18  Temp: 98.1 F (36.7 C) 98.6 F (37 C)  98.6 F (37 C)  TempSrc: Oral Oral  Oral  SpO2: 95% 94%  96%  Weight:   113.8 kg     Intake/Output Summary (Last 24 hours) at 08/19/2018 1520 Last data filed at 08/18/2018 1800 Gross per 24 hour  Intake 240 ml  Output -  Net 240 ml   Filed Weights   08/15/18 0654 08/17/18 0553 08/19/18 0700  Weight: (!) 140 kg 112.8 kg 113.8 kg    Examination: General exam: Awake, laying in bed, in nad Respiratory system: Normal respiratory effort, no wheezing  Data Reviewed: I have personally reviewed following labs and imaging studies  CBC: Recent Labs  Lab 08/13/18 0306 08/17/18 0412  WBC 9.7 9.5  HGB 13.0 14.3  HCT 40.2 44.4  MCV  89.5 88.3  PLT 459* 798*   Basic Metabolic Panel: Recent Labs  Lab 08/13/18 0306 08/17/18 0412 08/19/18 0450  NA 133* 135  --   K 3.9 3.7  --   CL 99 100  --   CO2 21* 21*  --   GLUCOSE 116* 100*  --   BUN 17 25*  --   CREATININE 1.79* 1.99* 2.16*  CALCIUM 8.9 10.1  --   MG 1.9  --   --    GFR: Estimated Creatinine Clearance: 49.3 mL/min (A) (by C-G formula based on SCr of 2.16 mg/dL (H)). Liver Function Tests: Recent Labs  Lab 08/17/18 0412  AST 22  ALT 23  ALKPHOS 49  BILITOT 0.6  PROT 7.4  ALBUMIN 3.9   No results for input(s): LIPASE, AMYLASE in the last 168 hours. No results for input(s): AMMONIA in the last 168 hours. Coagulation Profile: No results for input(s): INR, PROTIME in the last 168 hours. Cardiac Enzymes: No results for input(s): CKTOTAL, CKMB, CKMBINDEX, TROPONINI in the last 168 hours. BNP (last 3 results) No results for input(s): PROBNP in the last 8760 hours. HbA1C: No results for input(s): HGBA1C in the last 72 hours. CBG: Recent Labs  Lab 08/18/18 1657 08/18/18 2108 08/19/18 0440 08/19/18 0833 08/19/18 1157  GLUCAP 118* 114* 108* 106* 92   Lipid Profile: No results for input(s): CHOL, HDL, LDLCALC, TRIG, CHOLHDL,  LDLDIRECT in the last 72 hours. Thyroid Function Tests: No results for input(s): TSH, T4TOTAL, FREET4, T3FREE, THYROIDAB in the last 72 hours. Anemia Panel: No results for input(s): VITAMINB12, FOLATE, FERRITIN, TIBC, IRON, RETICCTPCT in the last 72 hours. Sepsis Labs: No results for input(s): PROCALCITON, LATICACIDVEN in the last 168 hours.  Recent Results (from the past 240 hour(s))  SARS Coronavirus 2 (CEPHEID - Performed in [place] hospital lab), Hosp Order     Status: None   Collection Time: 08/10/18  8:51 AM  Result Value Ref Range Status   SARS Coronavirus 2 NEGATIVE NEGATIVE Final    Comment: (NOTE) If result is NEGATIVE SARS-CoV-2 target nucleic acids are NOT DETECTED. The SARS-CoV-2 RNA is generally  detectable in upper and lower  respiratory specimens during the acute phase of infection. The lowest  concentration of SARS-CoV-2 viral copies this assay can detect is 250  copies / mL. A negative result does not preclude SARS-CoV-2 infection  and should not be used as the sole basis for treatment or other  patient management decisions.  A negative result may occur with  improper specimen collection / handling, submission of specimen other  than nasopharyngeal swab, presence of viral mutation(s) within the  areas targeted by this assay, and inadequate number of viral copies  (<250 copies / mL). A negative result must be combined with clinical  observations, patient history, and epidemiological information. If result is POSITIVE SARS-CoV-2 target nucleic acids are DETECTED. The SARS-CoV-2 RNA is generally detectable in upper and lower  respiratory specimens dur ing the acute phase of infection.  Positive  results are indicative of active infection with SARS-CoV-2.  Clinical  correlation with patient history and other diagnostic information is  necessary to determine patient infection status.  Positive results do  not rule out bacterial infection or co-infection with other viruses. If result is PRESUMPTIVE POSTIVE SARS-CoV-2 nucleic acids MAY BE PRESENT.   A presumptive positive result was obtained on the submitted specimen  and confirmed on repeat testing.  While 2019 novel coronavirus  (SARS-CoV-2) nucleic acids may be present in the submitted sample  additional confirmatory testing may be necessary for epidemiological  and / or clinical management purposes  to differentiate between  SARS-CoV-2 and other Sarbecovirus currently known to infect humans.  If clinically indicated additional testing with an alternate test  methodology [phone]) is advised. The SARS-CoV-2 RNA is generally  detectable in upper and lower respiratory sp ecimens during the acute  phase of infection. The  expected result is Negative. Fact Sheet for Patients:  StrictlyIdeas.no Fact Sheet for Healthcare Providers: BankingDealers.co.za This test is not yet approved or cleared by the [place] FDA and has been authorized for detection and/or diagnosis of SARS-CoV-2 by FDA under an Emergency Use Authorization (EUA).  This EUA will remain in effect (meaning this test can be used) for the duration of the COVID-19 declaration under Section 564(b)(1) of the Act, 21 U.S.C. section 360bbb-3(b)(1), unless the authorization is terminated or revoked sooner. Performed at [facility], [place] [address] [place], [place] [address]   Blood culture (routine x 2)     Status: None   Collection Time: 08/11/18 11:18 PM  Result Value Ref Range Status   Specimen Description BLOOD RIGHT HAND  Final   Special Requests   Final    BOTTLES DRAWN AEROBIC AND ANAEROBIC Blood Culture adequate volume   Culture   Final    NO GROWTH 5 DAYS Performed at [facility], [address], [place], [place] [address]    Report Status 08/17/2018 FINAL  Final  Blood culture (routine x 2)     Status: None   Collection Time: 08/11/18 11:50 PM  Result Value Ref Range Status   Specimen Description BLOOD RIGHT HAND  Final   Special Requests   Final    BOTTLES DRAWN AEROBIC AND ANAEROBIC Blood Culture adequate volume   Culture   Final    NO GROWTH 5 DAYS Performed at [facility], [place] [address] [place], [place] [address]    Report Status 08/17/2018 FINAL  Final     Radiology Studies: No results found.  Scheduled Meds: . aspirin EC  81 mg Oral Daily  . chlorhexidine  15 mL Mouth Rinse BID  . enoxaparin (LOVENOX) injection  40 mg Subcutaneous Q24H  . insulin aspart  0-5 Units Subcutaneous QHS  . insulin aspart  0-9 Units Subcutaneous TID WC  . mouth rinse  15 mL Mouth Rinse q12n4p  . metoprolol tartrate  25 mg Oral Daily  . minocycline  100 mg Oral BID  .  mirtazapine  30 mg Oral QHS  . risperiDONE  2 mg Oral Daily  . risperiDONE  4 mg Oral QHS  . sodium chloride flush  3 mL Intravenous Q12H   Continuous Infusions: . sodium chloride       LOS: 7 days   Marylu Lund, MD [facility] Pager On Amion  If 7PM-7AM, please contact night-coverage 08/19/2018, 3:20 PM

## 2018-08-19 NOTE — Progress Notes (Signed)
Patient has not received any bed offers for psych placement as of now. CSW to continue following.  Madilyn Fireman, MSW, LCSW-A Clinical Social Worker [facility] [phone]

## 2018-08-19 NOTE — Plan of Care (Signed)
  Problem: Health Behavior/Discharge Planning: Goal: Ability to manage health-related needs will improve Outcome: Progressing   Problem: Clinical Measurements: Goal: Ability to maintain clinical measurements within normal limits will improve Outcome: Progressing Goal: Will remain free from infection Outcome: Progressing Goal: Diagnostic test results will improve Outcome: Progressing Goal: Respiratory complications will improve Outcome: Progressing Goal: Cardiovascular complication will be avoided Outcome: Progressing   Problem: Coping: Goal: Level of anxiety will decrease Outcome: Progressing   Problem: Safety: Goal: Ability to remain free from injury will improve Outcome: Progressing   Problem: Skin Integrity: Goal: Risk for impaired skin integrity will decrease Outcome: Progressing   Problem: Activity: Goal: Risk for activity intolerance will decrease Outcome: Completed/Met   Problem: Nutrition: Goal: Adequate nutrition will be maintained Outcome: Completed/Met   Problem: Elimination: Goal: Will not experience complications related to bowel motility Outcome: Completed/Met Goal: Will not experience complications related to urinary retention Outcome: Completed/Met   Problem: Pain Managment: Goal: General experience of comfort will improve Outcome: Completed/Met   Problem: Education: Goal: Knowledge of General Education information will improve Description Including pain rating scale, medication(s)/side effects and non-pharmacologic comfort measures Outcome: Not Applicable

## 2018-08-20 LAB — GLUCOSE, CAPILLARY
Glucose-Capillary: 103 mg/dL — ABNORMAL HIGH (ref 70–99)
Glucose-Capillary: 106 mg/dL — ABNORMAL HIGH (ref 70–99)
Glucose-Capillary: 108 mg/dL — ABNORMAL HIGH (ref 70–99)
Glucose-Capillary: 79 mg/dL (ref 70–99)

## 2018-08-20 NOTE — Plan of Care (Signed)
  Problem: Health Behavior/Discharge Planning: Goal: Ability to manage health-related needs will improve Outcome: Adequate for Discharge   Problem: Clinical Measurements: Goal: Ability to maintain clinical measurements within normal limits will improve Outcome: Adequate for Discharge Goal: Will remain free from infection Outcome: Adequate for Discharge Goal: Diagnostic test results will improve Outcome: Adequate for Discharge Goal: Respiratory complications will improve Outcome: Adequate for Discharge Goal: Cardiovascular complication will be avoided Outcome: Adequate for Discharge   Problem: Coping: Goal: Level of anxiety will decrease Outcome: Adequate for Discharge   Problem: Safety: Goal: Ability to remain free from injury will improve Outcome: Adequate for Discharge   Problem: Skin Integrity: Goal: Risk for impaired skin integrity will decrease Outcome: Adequate for Discharge

## 2018-08-20 NOTE — Progress Notes (Signed)
PROGRESS NOTE    [name] [name].  FIE:[id number] DOB: 1965-06-24 DOA: 08/12/2018 PCP: Ann Held, DO    Brief Narrative:  53 y.o.malewith medical history significant ofDM; HTN; HLD; and schizophrenia presenting with Psychosis. Caretaker reported that he had not slept since Sunday. Patient was found to have a gun that he was pointing around in the lobby and also a knife, and [employer] took him into custody. The patient was unable to provide history at the time of admission. He was given sedatives prior to my evaluation and was obtunded.  Psych eval - increasingly agitated per caregiver. IVC done after gun and knife in waiting room. AKI - given bolus. Will recheck BMP and if normalizing can go to inpatient [facility] today. If not normalizing, can observe overnight for IV hydration.  Patient was discharged to behavioral health hospital on 08/11/2018 for further psychiatric care.  Apparently when patient arrived he was agitated and given Dilaudid and Ativan per nursing staff.  He became unresponsive and was transferred back to [facility] due to his current mental status.  Assessment & Plan:   Principal Problem:   Schizophrenia ([place]) Active Problems:   HTN (hypertension)   Renal insufficiency   Schizophrenia, disorganized type ([place])   DM (diabetes mellitus) type II uncontrolled, periph vascular disorder (HCC)   Schizoaffective disorder, bipolar type ([place])   Acute respiratory failure with hypercapnia (HCC)  Acute respiratory failure with hypoxia and hypercapnia: Resolved Acute metabolic versus toxic encephalopathy: Resolved Patient representing back to [facility] following recent discharge earlier in the evening to behavioral health hospital for further evaluation and treatment of his underlying psychosis secondary to schizophrenia.  Apparently, per nursing staff when patient was at the behavioral health hospital he was agitated and given sedatives to include Dilaudid  and Ativan.  Following administration of these medications, he became unresponsive and subsequently was transferred back to [facility].  On arrival to patient's room this morning, patient was noted to be obtunded on BiPAP.  No response to sternal rub and noted pinpoint pupils, likely secondary to drug overdose with benzodiazepines and narcotics.  Patient was given flumazenil 0.2 mg IV and Narcan 0.2 mg IV with mild improvement of his mentation.  This was followed by an additional dose of flumazenil 0.3 mg IV and Narcan 0.2 mg IV with resolution of his encephalopathy. --Titrated off of supplemental oxygen, doing well on room air --Avoid narcotics and benzos in this patient --Continue with supportive care --Continue one-to-one sitter  --Sitting comfortably. Had been ambulating without difficulty  Schizophrenia with acute psychosis Patient initially presenting after being found with a gun and knife and subdued by [employer]  Per his roommate, he has not been sleeping recently and likely noncompliant with his home medications.  He has been deemed a danger to himself and others and has been recommended for inpatient behavioral health treatment.  Currently on involuntary commitment. --Continues with rambling and incoherent speech --Continue involuntary commitment, 1:1 safety sitter --continue Risperdal 2 mg every morning and 4 mg every afternoon. --Continued on trazodone 100 mg nightly --Haldol 2 mg IV every 6 hours as needed for agitation, has received 0 doses in the p24h --Psychiatry eval 08/14/2018, will need inpatient psychiatric hospitalization; case management for coordination, denied by [facility] and [facility]. No beds at [place] hypertension --Continue metoprolol tartrate 25 mg p.o. daily  Type 2 diabetes mellitus Last hemoglobin A1c 6.6 on 04/06/2018.  Was previously on metformin but discontinued due to  renal insufficiency; in favor of glyburide.  Review of  blood sugars, well-controlled with consistent carbohydrate diet.  May not require oral hypoglycemics on discharge. --Hold oral hypoglycemics while inpatient --Consistent carbohydrate diet --Insulin sliding scale for coverage --CBG's qAC/HS  Cellulitis left lower extremity --Continue minocycline x7 days  Acute on CKD stage III: resolved Baseline creatinine between 1.6 - 2.0 over the past year.  Initially admitted with a creatinine of 2.36, likely secondary to prerenal failure secondary to dehydration and continuation of his home metformin. --Cr improved to 1.79 --Metformin discontinued --Avoid ACE inhibitors and NSAIDs  DVT prophylaxis: Lovenox subQ Code Status: Full Family Communication: Pt in room, family not at bedside Disposition Plan: Inpatient psych when bed available  Consultants:   Psych  Procedures:     Antimicrobials: Anti-infectives (From admission, onward)   Start     Dose/Rate Route Frequency Ordered Stop   08/12/18 1000  minocycline (MINOCIN) capsule 100 mg     100 mg Oral 2 times daily 08/12/18 0429        Subjective: Without complaints  Objective: Vitals:   08/19/18 1355 08/19/18 2114 08/20/18 0623 08/20/18 1436  BP: 120/78 (!) 135/96 (!) 138/98 103/80  Pulse: 90 97 100 87  Resp: 18 18 18 18   Temp: 98.6 F (37 C) 98.5 F (36.9 C) 98.5 F (36.9 C) 99.8 F (37.7 C)  TempSrc: Oral Oral Oral Oral  SpO2: 96% 97% 97% 95%  Weight:        Intake/Output Summary (Last 24 hours) at 08/20/2018 1514 Last data filed at 08/20/2018 1218 Gross per 24 hour  Intake 600 ml  Output -  Net 600 ml   Filed Weights   08/15/18 0654 08/17/18 0553 08/19/18 0700  Weight: (!) 140 kg 112.8 kg 113.8 kg    Examination: General exam: awake, sitting in bed, in no acute distress Respiratory system: normal chest rise, clear, no audible wheezing  Data Reviewed: I have personally reviewed following labs and imaging studies  CBC: Recent Labs  Lab 08/17/18 0412  WBC  9.5  HGB 14.3  HCT 44.4  MCV 88.3  PLT 696*   Basic Metabolic Panel: Recent Labs  Lab 08/17/18 0412 08/19/18 0450  NA 135  --   K 3.7  --   CL 100  --   CO2 21*  --   GLUCOSE 100*  --   BUN 25*  --   CREATININE 1.99* 2.16*  CALCIUM 10.1  --    GFR: Estimated Creatinine Clearance: 49.3 mL/min (A) (by C-G formula based on SCr of 2.16 mg/dL (H)). Liver Function Tests: Recent Labs  Lab 08/17/18 0412  AST 22  ALT 23  ALKPHOS 49  BILITOT 0.6  PROT 7.4  ALBUMIN 3.9   No results for input(s): LIPASE, AMYLASE in the last 168 hours. No results for input(s): AMMONIA in the last 168 hours. Coagulation Profile: No results for input(s): INR, PROTIME in the last 168 hours. Cardiac Enzymes: No results for input(s): CKTOTAL, CKMB, CKMBINDEX, TROPONINI in the last 168 hours. BNP (last 3 results) No results for input(s): PROBNP in the last 8760 hours. HbA1C: No results for input(s): HGBA1C in the last 72 hours. CBG: Recent Labs  Lab 08/19/18 1157 08/19/18 1655 08/19/18 2115 08/20/18 0759 08/20/18 1222  GLUCAP 92 86 103* 103* 106*   Lipid Profile: No results for input(s): CHOL, HDL, LDLCALC, TRIG, CHOLHDL, LDLDIRECT in the last 72 hours. Thyroid Function Tests: No results for input(s): TSH, T4TOTAL, FREET4, T3FREE, THYROIDAB in the  last 72 hours. Anemia Panel: No results for input(s): VITAMINB12, FOLATE, FERRITIN, TIBC, IRON, RETICCTPCT in the last 72 hours. Sepsis Labs: No results for input(s): PROCALCITON, LATICACIDVEN in the last 168 hours.  Recent Results (from the past 240 hour(s))  Blood culture (routine x 2)     Status: None   Collection Time: 08/11/18 11:18 PM  Result Value Ref Range Status   Specimen Description BLOOD RIGHT HAND  Final   Special Requests   Final    BOTTLES DRAWN AEROBIC AND ANAEROBIC Blood Culture adequate volume   Culture   Final    NO GROWTH 5 DAYS Performed at [facility], [address] [place], [place] [address]    Report  Status 08/17/2018 FINAL  Final  Blood culture (routine x 2)     Status: None   Collection Time: 08/11/18 11:50 PM  Result Value Ref Range Status   Specimen Description BLOOD RIGHT HAND  Final   Special Requests   Final    BOTTLES DRAWN AEROBIC AND ANAEROBIC Blood Culture adequate volume   Culture   Final    NO GROWTH 5 DAYS Performed at [facility], [place] [address], [place],  [address]    Report Status 08/17/2018 FINAL  Final     Radiology Studies: No results found.  Scheduled Meds: . aspirin EC  81 mg Oral Daily  . chlorhexidine  15 mL Mouth Rinse BID  . enoxaparin (LOVENOX) injection  40 mg Subcutaneous Q24H  . insulin aspart  0-5 Units Subcutaneous QHS  . insulin aspart  0-9 Units Subcutaneous TID WC  . mouth rinse  15 mL Mouth Rinse q12n4p  . metoprolol tartrate  25 mg Oral Daily  . minocycline  100 mg Oral BID  . mirtazapine  30 mg Oral QHS  . risperiDONE  2 mg Oral Daily  . risperiDONE  4 mg Oral QHS  . sodium chloride flush  3 mL Intravenous Q12H   Continuous Infusions: . sodium chloride       LOS: 8 days   Marylu Lund, MD [facility] Pager On Amion  If 7PM-7AM, please contact night-coverage 08/20/2018, 3:14 PM

## 2018-08-21 LAB — GLUCOSE, CAPILLARY
Glucose-Capillary: 151 mg/dL — ABNORMAL HIGH (ref 70–99)
Glucose-Capillary: 85 mg/dL (ref 70–99)
Glucose-Capillary: 120 mg/dL — ABNORMAL HIGH (ref 70–99)
Glucose-Capillary: 91 mg/dL (ref 70–99)

## 2018-08-21 NOTE — Progress Notes (Signed)
PROGRESS NOTE    [name] [name].  ZOX:[id number] DOB: [birth year] DOA: 08/12/2018 PCP: Ann Held, DO    Brief Narrative:  53 y.o.malewith medical history significant ofDM; HTN; HLD; and schizophrenia presenting with Psychosis. Caretaker reported that he had not slept since Sunday. Patient was found to have a gun that he was pointing around in the lobby and also a knife, and [employer] took him into custody. The patient was unable to provide history at the time of admission. He was given sedatives prior to my evaluation and was obtunded.  Psych eval - increasingly agitated per caregiver. IVC done after gun and knife in waiting room. AKI - given bolus. Will recheck BMP and if normalizing can go to inpatient [facility] today. If not normalizing, can observe overnight for IV hydration.  Patient was discharged to behavioral health hospital on 08/11/2018 for further psychiatric care.  Apparently when patient arrived he was agitated and given Dilaudid and Ativan per nursing staff.  He became unresponsive and was transferred back to [facility] due to his current mental status.  Assessment & Plan:   Principal Problem:   Schizophrenia ([place]) Active Problems:   HTN (hypertension)   Renal insufficiency   Schizophrenia, disorganized type ([place])   DM (diabetes mellitus) type II uncontrolled, periph vascular disorder (HCC)   Schizoaffective disorder, bipolar type ([place])   Acute respiratory failure with hypercapnia (HCC)  Acute respiratory failure with hypoxia and hypercapnia: Resolved Acute metabolic versus toxic encephalopathy: Resolved Patient representing back to [facility] following recent discharge earlier in the evening to behavioral health hospital for further evaluation and treatment of his underlying psychosis secondary to schizophrenia.  Apparently, per nursing staff when patient was at the behavioral health hospital he was agitated and given sedatives to include Dilaudid  and Ativan.  Following administration of these medications, he became unresponsive and subsequently was transferred back to [facility].  On arrival to patient's room this morning, patient was noted to be obtunded on BiPAP.  No response to sternal rub and noted pinpoint pupils, likely secondary to drug overdose with benzodiazepines and narcotics.  Patient was given flumazenil 0.2 mg IV and Narcan 0.2 mg IV with mild improvement of his mentation.  This was followed by an additional dose of flumazenil 0.3 mg IV and Narcan 0.2 mg IV with resolution of his encephalopathy. --Titrated off of supplemental oxygen, doing well on room air --Avoid narcotics and benzos in this patient --Continue with supportive care --Continue one-to-one sitter  --Sitting comfortably. Had been ambulating without difficulty  Schizophrenia with acute psychosis Patient initially presenting after being found with a gun and knife and subdued by [employer]  Per his roommate, he has not been sleeping recently and likely noncompliant with his home medications.  He has been deemed a danger to himself and others and has been recommended for inpatient behavioral health treatment.  Currently on involuntary commitment. --Continues with rambling and incoherent speech --Continue involuntary commitment, 1:1 safety sitter --continue Risperdal 2 mg every morning and 4 mg every afternoon. --Continued on trazodone 100 mg nightly --Haldol 2 mg IV every 6 hours as needed for agitation, has received 0 doses in the p24h --Psychiatry eval 08/14/2018, will need inpatient psychiatric hospitalization; case management for coordination, denied by [facility] and [facility]. No beds at [facility], No beds at [facility]  Essential hypertension --Continue metoprolol tartrate 25 mg p.o. daily  Type 2 diabetes mellitus Last hemoglobin A1c 6.6 on 04/06/2018.  Was previously on metformin  but discontinued due to renal insufficiency; in favor of  glyburide.  Review of blood sugars, well-controlled with consistent carbohydrate diet.  May not require oral hypoglycemics on discharge. --Hold oral hypoglycemics while inpatient --Consistent carbohydrate diet --Insulin sliding scale for coverage --CBG's qAC/HS  Cellulitis left lower extremity --Continue minocycline x7 days  Acute on CKD stage III: resolved Baseline creatinine between 1.6 - 2.0 over the past year.  Initially admitted with a creatinine of 2.36, likely secondary to prerenal failure secondary to dehydration and continuation of his home metformin. --Cr stable --Metformin discontinued --Avoid ACE inhibitors and NSAIDs  DVT prophylaxis: Lovenox subQ Code Status: Full Family Communication: Pt in room, family not at bedside Disposition Plan: Inpatient psych when bed available  Consultants:   Psych  Procedures:     Antimicrobials: Anti-infectives (From admission, onward)   Start     Dose/Rate Route Frequency Ordered Stop   08/12/18 1000  minocycline (MINOCIN) capsule 100 mg     100 mg Oral 2 times daily 08/12/18 0429        Subjective: No complaints at this time  Objective: Vitals:   08/20/18 1436 08/20/18 2104 08/21/18 0557 08/21/18 0924  BP: 103/80 100/71 103/79 114/72  Pulse: 87 90 (!) 102 95  Resp: 18 16 16    Temp: 99.8 F (37.7 C) 99.2 F (37.3 C) 99.1 F (37.3 C) 97.9 F (36.6 C)  TempSrc: Oral Oral Oral Oral  SpO2: 95% 94% 95%   Weight:        Intake/Output Summary (Last 24 hours) at 08/21/2018 1133 Last data filed at 08/21/2018 0830 Gross per 24 hour  Intake 600 ml  Output -  Net 600 ml   Filed Weights   08/15/18 0654 08/17/18 0553 08/19/18 0700  Weight: (!) 140 kg 112.8 kg 113.8 kg    Examination: General exam: Conversant, in no acute distress Respiratory system: normal chest rise, clear, no audible wheezing  Data Reviewed: I have personally reviewed following labs and imaging studies  CBC: Recent Labs  Lab 08/17/18 0412   WBC 9.5  HGB 14.3  HCT 44.4  MCV 88.3  PLT 846*   Basic Metabolic Panel: Recent Labs  Lab 08/17/18 0412 08/19/18 0450  NA 135  --   K 3.7  --   CL 100  --   CO2 21*  --   GLUCOSE 100*  --   BUN 25*  --   CREATININE 1.99* 2.16*  CALCIUM 10.1  --    GFR: Estimated Creatinine Clearance: 49.3 mL/min (A) (by C-G formula based on SCr of 2.16 mg/dL (H)). Liver Function Tests: Recent Labs  Lab 08/17/18 0412  AST 22  ALT 23  ALKPHOS 49  BILITOT 0.6  PROT 7.4  ALBUMIN 3.9   No results for input(s): LIPASE, AMYLASE in the last 168 hours. No results for input(s): AMMONIA in the last 168 hours. Coagulation Profile: No results for input(s): INR, PROTIME in the last 168 hours. Cardiac Enzymes: No results for input(s): CKTOTAL, CKMB, CKMBINDEX, TROPONINI in the last 168 hours. BNP (last 3 results) No results for input(s): PROBNP in the last 8760 hours. HbA1C: No results for input(s): HGBA1C in the last 72 hours. CBG: Recent Labs  Lab 08/20/18 0759 08/20/18 1222 08/20/18 1654 08/20/18 2140 08/21/18 0854  GLUCAP 103* 106* 108* 79 151*   Lipid Profile: No results for input(s): CHOL, HDL, LDLCALC, TRIG, CHOLHDL, LDLDIRECT in the last 72 hours. Thyroid Function Tests: No results for input(s): TSH, T4TOTAL, FREET4, T3FREE, THYROIDAB in  the last 72 hours. Anemia Panel: No results for input(s): VITAMINB12, FOLATE, FERRITIN, TIBC, IRON, RETICCTPCT in the last 72 hours. Sepsis Labs: No results for input(s): PROCALCITON, LATICACIDVEN in the last 168 hours.  Recent Results (from the past 240 hour(s))  Blood culture (routine x 2)     Status: None   Collection Time: 08/11/18 11:18 PM  Result Value Ref Range Status   Specimen Description BLOOD RIGHT HAND  Final   Special Requests   Final    BOTTLES DRAWN AEROBIC AND ANAEROBIC Blood Culture adequate volume   Culture   Final    NO GROWTH 5 DAYS Performed at [facility], [address] [place], [place] [address]     Report Status 08/17/2018 FINAL  Final  Blood culture (routine x 2)     Status: None   Collection Time: 08/11/18 11:50 PM  Result Value Ref Range Status   Specimen Description BLOOD RIGHT HAND  Final   Special Requests   Final    BOTTLES DRAWN AEROBIC AND ANAEROBIC Blood Culture adequate volume   Culture   Final    NO GROWTH 5 DAYS Performed at [facility], [place] [address] [place], [place] [address]    Report Status 08/17/2018 FINAL  Final     Radiology Studies: No results found.  Scheduled Meds: . aspirin EC  81 mg Oral Daily  . chlorhexidine  15 mL Mouth Rinse BID  . enoxaparin (LOVENOX) injection  40 mg Subcutaneous Q24H  . insulin aspart  0-5 Units Subcutaneous QHS  . insulin aspart  0-9 Units Subcutaneous TID WC  . mouth rinse  15 mL Mouth Rinse q12n4p  . metoprolol tartrate  25 mg Oral Daily  . minocycline  100 mg Oral BID  . mirtazapine  30 mg Oral QHS  . risperiDONE  2 mg Oral Daily  . risperiDONE  4 mg Oral QHS  . sodium chloride flush  3 mL Intravenous Q12H   Continuous Infusions: . sodium chloride       LOS: 9 days   Marylu Lund, MD [facility] Pager On Amion  If 7PM-7AM, please contact night-coverage 08/21/2018, 11:33 AM

## 2018-08-21 NOTE — Progress Notes (Signed)
[facility] does not have beds available. No other offers present.   Percell Locus Sharhonda Atwood LCSW [phone]

## 2018-08-22 LAB — GLUCOSE, CAPILLARY
Glucose-Capillary: 101 mg/dL — ABNORMAL HIGH (ref 70–99)
Glucose-Capillary: 92 mg/dL (ref 70–99)
Glucose-Capillary: 90 mg/dL (ref 70–99)
Glucose-Capillary: 95 mg/dL (ref 70–99)

## 2018-08-22 NOTE — Progress Notes (Signed)
PROGRESS NOTE    [name] [name].  FBP:[id number] DOB: [birth year] DOA: 08/12/2018 PCP: Ann Held, DO    Brief Narrative:  53 y.o.malewith medical history significant ofDM; HTN; HLD; and schizophrenia presenting with Psychosis. Caretaker reported that he had not slept since Sunday. Patient was found to have a gun that he was pointing around in the lobby and also a knife, and [employer] took him into custody. The patient was unable to provide history at the time of admission. He was given sedatives prior to my evaluation and was obtunded.  Psych eval - increasingly agitated per caregiver. IVC done after gun and knife in waiting room. AKI - given bolus. Will recheck BMP and if normalizing can go to inpatient [facility] today. If not normalizing, can observe overnight for IV hydration.  Patient was discharged to behavioral health hospital on 08/11/2018 for further psychiatric care.  Apparently when patient arrived he was agitated and given Dilaudid and Ativan per nursing staff.  He became unresponsive and was [facility] due to his current mental status.  Assessment & Plan:   Principal Problem:   Schizophrenia ([place]) Active Problems:   HTN (hypertension)   Renal insufficiency   Schizophrenia, disorganized type ([place])   DM (diabetes mellitus) type II uncontrolled, periph vascular disorder (HCC)   Schizoaffective disorder, bipolar type ([place])   Acute respiratory failure with hypercapnia (HCC)  Acute respiratory failure with hypoxia and hypercapnia: Resolved Acute metabolic versus toxic encephalopathy: Resolved Patient representing back to [facility] following recent discharge earlier in the evening to behavioral health hospital for further evaluation and treatment of his underlying psychosis secondary to schizophrenia.  Apparently, per nursing staff when patient was at the behavioral health hospital he was agitated and given sedatives to include Dilaudid  and Ativan.  Following administration of these medications, he became unresponsive and subsequently was transferred back to [facility].  On arrival to patient's room this morning, patient was noted to be obtunded on BiPAP.  No response to sternal rub and noted pinpoint pupils, likely secondary to drug overdose with benzodiazepines and narcotics.  Patient was given flumazenil 0.2 mg IV and Narcan 0.2 mg IV with mild improvement of his mentation.  This was followed by an additional dose of flumazenil 0.3 mg IV and Narcan 0.2 mg IV with resolution of his encephalopathy. --Titrated off of supplemental oxygen, doing well on room air --Avoid narcotics and benzos in this patient --Continue with supportive care --Continue one-to-one sitter  --sitting in bed, appears comfortable  Schizophrenia with acute psychosis Patient initially presenting after being found with a gun and knife and subdued by [employer].  Per his roommate, he has not been sleeping recently and likely noncompliant with his home medications.  He has been deemed a danger to himself and others and has been recommended for inpatient behavioral health treatment.  Currently on involuntary commitment. --Continues with rambling and incoherent speech --Continue involuntary commitment, 1:1 safety sitter --continue Risperdal 2 mg every morning and 4 mg every afternoon. --Continued on trazodone 100 mg nightly --Haldol 2 mg IV every 6 hours as needed for agitation, has received 0 doses in the p24h --Psychiatry eval 08/14/2018, will need inpatient psychiatric hospitalization; case management for coordination, denied by [facility] and [facility]. No beds at [facility], No beds at [facility] --bed search continues. Will repeat labs in AM, anticipate checking on weekly basis pending placement  Essential hypertension --Continue metoprolol tartrate 25 mg p.o. daily -BP stable  Type 2 diabetes mellitus Last hemoglobin A1c 6.6 on 04/06/2018.   Was previously on metformin but discontinued due to renal insufficiency; in favor of glyburide.  Review of blood sugars, well-controlled with consistent carbohydrate diet.  May not require oral hypoglycemics on discharge. --Hold oral hypoglycemics while inpatient --Consistent carbohydrate diet --Insulin sliding scale for coverage --CBG's qAC/HS  Cellulitis left lower extremity --Continue minocycline x7 days  Acute on CKD stage III: resolved Baseline creatinine between 1.6 - 2.0 over the past year.  Initially admitted with a creatinine of 2.36, likely secondary to prerenal failure secondary to dehydration and continuation of his home metformin. --Cr stable --Metformin discontinued --Avoid ACE inhibitors and NSAIDs  DVT prophylaxis: Lovenox subQ Code Status: Full Family Communication: Pt in room, family not at bedside Disposition Plan: Inpatient psych when bed available  Consultants:   Psych  Procedures:     Antimicrobials: Anti-infectives (From admission, onward)   Start     Dose/Rate Route Frequency Ordered Stop   08/12/18 1000  minocycline (MINOCIN) capsule 100 mg     100 mg Oral 2 times daily 08/12/18 0429        Subjective: Without complaints at this time  Objective: Vitals:   08/21/18 2302 08/22/18 0510 08/22/18 0642 08/22/18 0943  BP: 104/70 110/69  112/79  Pulse: 88 90  97  Resp:  16  16  Temp:  (!) 97.4 F (36.3 C)  98.3 F (36.8 C)  TempSrc:    Oral  SpO2: 99% 98%  94%  Weight:   111.6 kg     Intake/Output Summary (Last 24 hours) at 08/22/2018 1418 Last data filed at 08/22/2018 1353 Gross per 24 hour  Intake 960 ml  Output 0 ml  Net 960 ml   Filed Weights   08/17/18 0553 08/19/18 0700 08/22/18 0642  Weight: 112.8 kg 113.8 kg 111.6 kg    Examination: General exam: Awake, sitting at edge of bed, in nad Respiratory system: Normal respiratory effort, no wheezing  Data Reviewed: I have personally reviewed following labs and imaging studies   CBC: Recent Labs  Lab 08/17/18 0412  WBC 9.5  HGB 14.3  HCT 44.4  MCV 88.3  PLT 245*   Basic Metabolic Panel: Recent Labs  Lab 08/17/18 0412 08/19/18 0450  NA 135  --   K 3.7  --   CL 100  --   CO2 21*  --   GLUCOSE 100*  --   BUN 25*  --   CREATININE 1.99* 2.16*  CALCIUM 10.1  --    GFR: Estimated Creatinine Clearance: 48.8 mL/min (A) (by C-G formula based on SCr of 2.16 mg/dL (H)). Liver Function Tests: Recent Labs  Lab 08/17/18 0412  AST 22  ALT 23  ALKPHOS 49  BILITOT 0.6  PROT 7.4  ALBUMIN 3.9   No results for input(s): LIPASE, AMYLASE in the last 168 hours. No results for input(s): AMMONIA in the last 168 hours. Coagulation Profile: No results for input(s): INR, PROTIME in the last 168 hours. Cardiac Enzymes: No results for input(s): CKTOTAL, CKMB, CKMBINDEX, TROPONINI in the last 168 hours. BNP (last 3 results) No results for input(s): PROBNP in the last 8760 hours. HbA1C: No results for input(s): HGBA1C in the last 72 hours. CBG: Recent Labs  Lab 08/21/18 1224 08/21/18 1702 08/21/18 2221 08/22/18 0746 08/22/18 1156  GLUCAP 85 120* 91 101* 92   Lipid Profile: No results for input(s): CHOL, HDL, LDLCALC, TRIG, CHOLHDL, LDLDIRECT in the last 72 hours. Thyroid Function  Tests: No results for input(s): TSH, T4TOTAL, FREET4, T3FREE, THYROIDAB in the last 72 hours. Anemia Panel: No results for input(s): VITAMINB12, FOLATE, FERRITIN, TIBC, IRON, RETICCTPCT in the last 72 hours. Sepsis Labs: No results for input(s): PROCALCITON, LATICACIDVEN in the last 168 hours.  No results found for this or any previous visit (from the past 240 hour(s)).   Radiology Studies: No results found.  Scheduled Meds: . aspirin EC  81 mg Oral Daily  . chlorhexidine  15 mL Mouth Rinse BID  . enoxaparin (LOVENOX) injection  40 mg Subcutaneous Q24H  . insulin aspart  0-5 Units Subcutaneous QHS  . insulin aspart  0-9 Units Subcutaneous TID WC  . mouth rinse  15 mL  Mouth Rinse q12n4p  . metoprolol tartrate  25 mg Oral Daily  . minocycline  100 mg Oral BID  . mirtazapine  30 mg Oral QHS  . risperiDONE  2 mg Oral Daily  . risperiDONE  4 mg Oral QHS  . sodium chloride flush  3 mL Intravenous Q12H   Continuous Infusions: . sodium chloride       LOS: 10 days   Marylu Lund, MD [facility] Pager On Amion  If 7PM-7AM, please contact night-coverage 08/22/2018, 2:18 PM

## 2018-08-22 NOTE — Progress Notes (Signed)
CSW sent referral again to [facility] and [place] since they state they threw away the initial referral. Will renew IVC today.   Percell Locus Darrion Wyszynski LCSW [phone]

## 2018-08-22 NOTE — TOC Progression Note (Signed)
Transition of Care (TOC) - Progression Note    Patient Details  Name: [name] [name]. MRN: [id number] Date of Birth: 1965-07-09  Transition of Care [facility]) CM/SW [place], LCSW Phone Number: 08/22/2018, 5:06 PM  Clinical Narrative:    CSW renewed IVC, patient has been served. [facility] reviewing to see if they can accept patient.         Expected Discharge Plan and Services                                                 Social Determinants of Health (SDOH) Interventions    Readmission Risk Interventions No flowsheet data found.

## 2018-08-22 NOTE — Care Management Important Message (Signed)
Important Message  Patient Details  Name: [name] [name]. MRN: [id number] Date of Birth: 1965-08-10   Medicare Important Message Given:  Yes    Forrest Demuro 08/22/2018, 3:18 PM

## 2018-08-23 LAB — CBC
HCT: 43.4 % (ref 39.0–52.0)
Hemoglobin: 14.2 g/dL (ref 13.0–17.0)
MCH: 29 pg (ref 26.0–34.0)
MCHC: 32.7 g/dL (ref 30.0–36.0)
MCV: 88.6 fL (ref 80.0–100.0)
Platelets: 539 10*3/uL — ABNORMAL HIGH (ref 150–400)
RBC: 4.9 MIL/uL (ref 4.22–5.81)
RDW: 13.2 % (ref 11.5–15.5)
WBC: 9 10*3/uL (ref 4.0–10.5)
nRBC: 0 % (ref 0.0–0.2)

## 2018-08-23 LAB — GLUCOSE, CAPILLARY
Glucose-Capillary: 102 mg/dL — ABNORMAL HIGH (ref 70–99)
Glucose-Capillary: 155 mg/dL — ABNORMAL HIGH (ref 70–99)
Glucose-Capillary: 124 mg/dL — ABNORMAL HIGH (ref 70–99)
Glucose-Capillary: 83 mg/dL (ref 70–99)
Glucose-Capillary: 109 mg/dL — ABNORMAL HIGH (ref 70–99)
Glucose-Capillary: 125 mg/dL — ABNORMAL HIGH (ref 70–99)

## 2018-08-23 LAB — BASIC METABOLIC PANEL
Anion gap: 12 (ref 5–15)
BUN: 24 mg/dL — ABNORMAL HIGH (ref 6–20)
CO2: 18 mmol/L — ABNORMAL LOW (ref 22–32)
Calcium: 9 mg/dL (ref 8.9–10.3)
Chloride: 104 mmol/L (ref 98–111)
Creatinine, Ser: 1.81 mg/dL — ABNORMAL HIGH (ref 0.61–1.24)
GFR calc Af Amer: 49 mL/min — ABNORMAL LOW (ref >60)
GFR calc non Af Amer: 43 mL/min — ABNORMAL LOW (ref >60)
Glucose, Bld: 92 mg/dL (ref 70–99)
Potassium: 3.6 mmol/L (ref 3.5–5.1)
Sodium: 134 mmol/L — ABNORMAL LOW (ref 135–145)

## 2018-08-23 NOTE — Clinical Social Work Note (Signed)
Contact made with [facility] [phone]) and talked with Judson Roch regarding patient and updated labs requested. Labs faxed to facility [phone]) and contact made with Sarah (3:25 pm) regarding patient. She will have nurse and MD review information and contact CSW on Thursday.  Nyiah Pianka Givens, MSW, LCSW Licensed Clinical Social Worker [place] [phone]
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
## 2018-08-24 LAB — GLUCOSE, CAPILLARY
Glucose-Capillary: 97 mg/dL (ref 70–99)
Glucose-Capillary: 85 mg/dL (ref 70–99)
Glucose-Capillary: 94 mg/dL (ref 70–99)
Glucose-Capillary: 147 mg/dL — ABNORMAL HIGH (ref 70–99)

## 2018-08-24 MED ORDER — METOPROLOL TARTRATE 12.5 MG HALF TABLET
12.5000 mg | ORAL_TABLET | Freq: Two times a day (BID) | ORAL | Status: DC
  Administered 2018-08-24 – 2018-08-25 (×2): 12.5 mg via ORAL
  Filled 2018-08-24 (×2): qty 1

## 2018-08-24 NOTE — Clinical Social Work Note (Signed)
Contact made with patient's legal guardian - [name] to determine what patient's discharge plan will be if he goes to [facility] for psychiatric treatment. Per Mr. Tamala Julian, patient will discharge back home - [address]  Sarah with [facility] contacted and updated [phone]). Per Sarah patient is at the top of their wait list and CSW will be contacted when a bed becomes available. CSW will continue to follow and facilitate discharge to [facility] when a bed becomes available.  Larue Drawdy Givens, MSW, LCSW Licensed Clinical Social Worker [place] [phone]
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
## 2018-08-25 DIAGNOSIS — R718 Other abnormality of red blood cells: Secondary | ICD-10-CM | POA: Diagnosis not present

## 2018-08-25 DIAGNOSIS — R609 Edema, unspecified: Secondary | ICD-10-CM | POA: Diagnosis not present

## 2018-08-25 DIAGNOSIS — N390 Urinary tract infection, site not specified: Secondary | ICD-10-CM | POA: Diagnosis not present

## 2018-08-25 DIAGNOSIS — E1151 Type 2 diabetes mellitus with diabetic peripheral angiopathy without gangrene: Secondary | ICD-10-CM | POA: Diagnosis not present

## 2018-08-25 DIAGNOSIS — F29 Unspecified psychosis not due to a substance or known physiological condition: Secondary | ICD-10-CM | POA: Diagnosis not present

## 2018-08-25 DIAGNOSIS — Z9114 Patient's other noncompliance with medication regimen: Secondary | ICD-10-CM | POA: Diagnosis not present

## 2018-08-25 DIAGNOSIS — Z79899 Other long term (current) drug therapy: Secondary | ICD-10-CM | POA: Diagnosis not present

## 2018-08-25 DIAGNOSIS — N289 Disorder of kidney and ureter, unspecified: Secondary | ICD-10-CM | POA: Diagnosis not present

## 2018-08-25 DIAGNOSIS — Z1389 Encounter for screening for other disorder: Secondary | ICD-10-CM | POA: Diagnosis not present

## 2018-08-25 DIAGNOSIS — E1165 Type 2 diabetes mellitus with hyperglycemia: Secondary | ICD-10-CM | POA: Diagnosis not present

## 2018-08-25 DIAGNOSIS — D6869 Other thrombophilia: Secondary | ICD-10-CM | POA: Diagnosis not present

## 2018-08-25 DIAGNOSIS — Z139 Encounter for screening, unspecified: Secondary | ICD-10-CM | POA: Diagnosis not present

## 2018-08-25 DIAGNOSIS — R6889 Other general symptoms and signs: Secondary | ICD-10-CM | POA: Diagnosis not present

## 2018-08-25 DIAGNOSIS — E119 Type 2 diabetes mellitus without complications: Secondary | ICD-10-CM | POA: Diagnosis not present

## 2018-08-25 DIAGNOSIS — J9602 Acute respiratory failure with hypercapnia: Secondary | ICD-10-CM | POA: Diagnosis not present

## 2018-08-25 DIAGNOSIS — R945 Abnormal results of liver function studies: Secondary | ICD-10-CM | POA: Diagnosis not present

## 2018-08-25 DIAGNOSIS — F250 Schizoaffective disorder, bipolar type: Secondary | ICD-10-CM | POA: Diagnosis not present

## 2018-08-25 DIAGNOSIS — R000 Tachycardia, unspecified: Secondary | ICD-10-CM | POA: Diagnosis not present

## 2018-08-25 DIAGNOSIS — R0602 Shortness of breath: Secondary | ICD-10-CM | POA: Diagnosis not present

## 2018-08-25 DIAGNOSIS — I10 Essential (primary) hypertension: Secondary | ICD-10-CM | POA: Diagnosis not present

## 2018-08-25 DIAGNOSIS — E785 Hyperlipidemia, unspecified: Secondary | ICD-10-CM | POA: Diagnosis not present

## 2018-08-25 DIAGNOSIS — F201 Disorganized schizophrenia: Secondary | ICD-10-CM | POA: Diagnosis not present

## 2018-08-25 DIAGNOSIS — N189 Chronic kidney disease, unspecified: Secondary | ICD-10-CM | POA: Diagnosis not present

## 2018-08-25 DIAGNOSIS — E669 Obesity, unspecified: Secondary | ICD-10-CM | POA: Diagnosis not present

## 2018-08-25 DIAGNOSIS — J3089 Other allergic rhinitis: Secondary | ICD-10-CM | POA: Diagnosis not present

## 2018-08-25 DIAGNOSIS — R42 Dizziness and giddiness: Secondary | ICD-10-CM | POA: Diagnosis not present

## 2018-08-25 LAB — GLUCOSE, CAPILLARY
Glucose-Capillary: 92 mg/dL (ref 70–99)
Glucose-Capillary: 87 mg/dL (ref 70–99)

## 2018-08-25 MED ORDER — INSULIN ASPART 100 UNIT/ML ~~LOC~~ SOLN: 0.0000 [IU] | Freq: Every day | SUBCUTANEOUS | 11 refills | Status: DC

## 2018-08-25 MED ORDER — INSULIN ASPART 100 UNIT/ML ~~LOC~~ SOLN: 0.0000 [IU] | Freq: Every day | SUBCUTANEOUS | 11 refills | Status: AC

## 2018-08-25 MED ORDER — RISPERIDONE 4 MG PO TABS: 4.0000 mg | ORAL_TABLET | Freq: Every day | ORAL | Status: AC

## 2018-08-25 MED ORDER — RISPERIDONE 2 MG PO TABS: 2.0000 mg | ORAL_TABLET | Freq: Every morning | ORAL | Status: AC

## 2018-08-25 MED ORDER — METOPROLOL TARTRATE 25 MG PO TABS: 12.5000 mg | ORAL_TABLET | Freq: Two times a day (BID) | ORAL | Status: DC

## 2018-08-25 MED ORDER — INSULIN ASPART 100 UNIT/ML ~~LOC~~ SOLN: 0.0000 [IU] | Freq: Three times a day (TID) | SUBCUTANEOUS | 11 refills | Status: AC

## 2018-08-25 NOTE — Progress Notes (Signed)
CSW spoke with [facility] for an update. They are waiting on their physician to come in to see if they can accept patient. Updated MD note faxed as requested.   Percell Locus Shanequia Kendrick LCSW [phone]

## 2018-08-25 NOTE — Progress Notes (Signed)
Report Given to Wayne Both RN
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
## 2018-08-25 NOTE — Progress Notes (Signed)
Nutrition Brief Note  RD working remotely. RD pulled to chart due to LOS (13 days).   Wt Readings from Last 15 Encounters:  08/25/18 112.2 kg  08/11/18 127 kg  04/06/18 112.9 kg  04/06/18 113.1 kg  03/03/18 111.1 kg  02/02/18 113.4 kg  11/18/17 108 kg  11/17/17 97.5 kg  11/17/17 107 kg  11/15/17 97.5 kg  11/05/17 97.5 kg  09/30/17 113.8 kg  08/09/17 113.4 kg  05/12/17 103.6 kg  02/09/17 112.1 kg   [name] [name]. is a 53 y.o. male with medical history significant of paranoid schizophrenia, hypertension, diabetes was discharged yesterday after medical evaluation for confusion and delirium was discharged to behavioral health for psychosis in the setting of paranoid schizophrenia arrived at behavior health was found to be confused and sedated in then right away turned around and sent back to the ED.  It appears after chart review that patient's been getting sedation throughout the last 40 hours or so and he becomes hypoxic however whenever he is not sedated he becomes extremely agitated.  Suspicion is that he has underlying untreated obstructive sleep apnea.  He does not have prior home oxygen requirements.  He was diagnosed with a mild left lower extremity cellulitis for which she is on Midrin for.  This does not appear to be worse than yesterday.  Patient was given Haldol on arrival to the emergency department because he was very combative.  He is also found to be in hypercapnic and hypoxic respiratory failure likely secondary to a component of sedation.  Currently on BiPAP and improving.  Patient be referred for admission for acute on chronic respiratory failure with hypoxia and hypercapnia.  He has no known history of COPD.  He just got back from MRI to rule out other etiology has a normal CT head from yesterday.  He also has a negative COVID from yesterday.  Pt admitted with acute respiratory failure and hypoxia and acute metabolic encephalopathy.   Reviewed I/O's: +1.2 L x 24  hours and +13.1 L since admission  UOP: 2 ml x 24 hours  Per CSW notes, pt is medically stable, but awaiting inpatient psych placement.   Lab Results  Component Value Date   HGBA1C 6.6 (H) 04/06/2018   PTA DM medications are 5 mg glyburide daily and 60 units lantus solostar daily.   Labs reviewed: CBGS: 84-147 (inpatient orders for glycemic control are 0-5 units insulin aspart q HS and 0-9 units insulin aspart TID with meals).   Body mass index is 38.74 kg/m. Patient meets criteria for obesity, class II based on current BMI.   Current diet order is carb modified, patient is consuming approximately 50-100% of meals at this time. Labs and medications reviewed.   No nutrition interventions warranted at this time. If nutrition issues arise, please consult RD.   Carlei Huang A. Jimmye Norman, RD, LDN, [place] Registered Dietitian II Certified Diabetes Care and Education Specialist Pager: [phone] After hours Pager: [phone]

## 2018-08-25 NOTE — TOC Transition Note (Signed)
Transition of Care [facility]) - CM/SW Discharge Note   Patient Details  Name: [name] [name]. MRN: [id number] Date of Birth: 05-27-1965  [facility] CM/SW Contact:  Benard Halsted, LCSW Phone Number: 08/25/2018, 12:43 PM   Clinical Narrative:    Patient will DC to: [facility]  Anticipated DC date: 08/25/18 Family notified: [name] Transport by: Brooks Sailors  Receiving MD: Dr. Troy Sine  Per MD patient ready for DC to [facility]. RN, patient, patient's family, and facility notified of DC. Discharge Summary and IVC sent to facility. RN to call report prior to discharge ([phone], ask for Adult Nurse station). DC packet on chart. Ambulance transport requested for patient.   CSW will sign off for now as social work intervention is no longer needed. Please consult [place] again if new needs arise.  Cedric Fishman, LCSW Clinical Social Worker [phone]    Final next level of care: Psychiatric Hospital Barriers to Discharge: No Barriers Identified   Patient Goals and CMS Choice Patient states their goals for this hospitalization and ongoing recovery are:: To become stable      Discharge Placement                Patient to be transferred to facility by: [facility] Name of family member notified: Jack Quarto Patient and family notified of of transfer: 08/25/18  Discharge Plan and Services                          HH Arranged: NA          Social Determinants of Health (SDOH) Interventions     Readmission Risk Interventions No flowsheet data found.

## 2018-08-26 DIAGNOSIS — Z1389 Encounter for screening for other disorder: Secondary | ICD-10-CM | POA: Diagnosis not present

## 2018-08-26 DIAGNOSIS — J3089 Other allergic rhinitis: Secondary | ICD-10-CM | POA: Diagnosis not present

## 2018-08-26 DIAGNOSIS — N189 Chronic kidney disease, unspecified: Secondary | ICD-10-CM | POA: Diagnosis not present

## 2018-08-26 DIAGNOSIS — Z79899 Other long term (current) drug therapy: Secondary | ICD-10-CM | POA: Diagnosis not present

## 2018-08-26 DIAGNOSIS — E1165 Type 2 diabetes mellitus with hyperglycemia: Secondary | ICD-10-CM | POA: Diagnosis not present

## 2018-08-26 DIAGNOSIS — I10 Essential (primary) hypertension: Secondary | ICD-10-CM | POA: Diagnosis not present

## 2018-08-26 DIAGNOSIS — N390 Urinary tract infection, site not specified: Secondary | ICD-10-CM | POA: Diagnosis not present

## 2018-08-28 DIAGNOSIS — I10 Essential (primary) hypertension: Secondary | ICD-10-CM | POA: Diagnosis not present

## 2018-08-28 DIAGNOSIS — F29 Unspecified psychosis not due to a substance or known physiological condition: Secondary | ICD-10-CM | POA: Diagnosis not present

## 2018-08-29 DIAGNOSIS — Z79899 Other long term (current) drug therapy: Secondary | ICD-10-CM | POA: Diagnosis not present

## 2018-08-29 DIAGNOSIS — R000 Tachycardia, unspecified: Secondary | ICD-10-CM | POA: Diagnosis not present

## 2018-08-29 DIAGNOSIS — I10 Essential (primary) hypertension: Secondary | ICD-10-CM | POA: Diagnosis not present

## 2018-08-30 DIAGNOSIS — I10 Essential (primary) hypertension: Secondary | ICD-10-CM | POA: Diagnosis not present

## 2018-08-30 DIAGNOSIS — R000 Tachycardia, unspecified: Secondary | ICD-10-CM | POA: Diagnosis not present

## 2018-08-31 DIAGNOSIS — F29 Unspecified psychosis not due to a substance or known physiological condition: Secondary | ICD-10-CM | POA: Diagnosis not present

## 2018-08-31 DIAGNOSIS — I10 Essential (primary) hypertension: Secondary | ICD-10-CM | POA: Diagnosis not present

## 2018-08-31 DIAGNOSIS — R000 Tachycardia, unspecified: Secondary | ICD-10-CM | POA: Diagnosis not present

## 2018-09-01 DIAGNOSIS — D6869 Other thrombophilia: Secondary | ICD-10-CM | POA: Diagnosis not present

## 2018-09-01 DIAGNOSIS — F29 Unspecified psychosis not due to a substance or known physiological condition: Secondary | ICD-10-CM | POA: Diagnosis not present

## 2018-09-01 DIAGNOSIS — R945 Abnormal results of liver function studies: Secondary | ICD-10-CM | POA: Diagnosis not present

## 2018-09-01 DIAGNOSIS — R718 Other abnormality of red blood cells: Secondary | ICD-10-CM | POA: Diagnosis not present

## 2018-09-01 DIAGNOSIS — E119 Type 2 diabetes mellitus without complications: Secondary | ICD-10-CM | POA: Diagnosis not present

## 2018-09-02 DIAGNOSIS — Z79899 Other long term (current) drug therapy: Secondary | ICD-10-CM | POA: Diagnosis not present

## 2018-09-02 DIAGNOSIS — R609 Edema, unspecified: Secondary | ICD-10-CM | POA: Diagnosis not present

## 2018-09-02 DIAGNOSIS — F29 Unspecified psychosis not due to a substance or known physiological condition: Secondary | ICD-10-CM | POA: Diagnosis not present

## 2018-09-03 ENCOUNTER — Other Ambulatory Visit (HOSPITAL_COMMUNITY): Payer: Self-pay | Admitting: Psychiatry

## 2018-09-03 DIAGNOSIS — F419 Anxiety disorder, unspecified: Secondary | ICD-10-CM

## 2018-09-03 DIAGNOSIS — R42 Dizziness and giddiness: Secondary | ICD-10-CM | POA: Diagnosis not present

## 2018-09-03 DIAGNOSIS — I10 Essential (primary) hypertension: Secondary | ICD-10-CM | POA: Diagnosis not present

## 2018-09-03 DIAGNOSIS — F200 Paranoid schizophrenia: Secondary | ICD-10-CM

## 2018-09-03 DIAGNOSIS — F29 Unspecified psychosis not due to a substance or known physiological condition: Secondary | ICD-10-CM | POA: Diagnosis not present

## 2018-09-04 DIAGNOSIS — F29 Unspecified psychosis not due to a substance or known physiological condition: Secondary | ICD-10-CM | POA: Diagnosis not present

## 2018-09-04 DIAGNOSIS — Z79899 Other long term (current) drug therapy: Secondary | ICD-10-CM | POA: Diagnosis not present

## 2018-09-04 DIAGNOSIS — R42 Dizziness and giddiness: Secondary | ICD-10-CM | POA: Diagnosis not present

## 2018-09-04 DIAGNOSIS — I10 Essential (primary) hypertension: Secondary | ICD-10-CM | POA: Diagnosis not present

## 2018-09-05 DIAGNOSIS — Z139 Encounter for screening, unspecified: Secondary | ICD-10-CM | POA: Diagnosis not present

## 2018-09-05 DIAGNOSIS — Z79899 Other long term (current) drug therapy: Secondary | ICD-10-CM | POA: Diagnosis not present

## 2018-09-05 DIAGNOSIS — R609 Edema, unspecified: Secondary | ICD-10-CM | POA: Diagnosis not present

## 2018-09-05 DIAGNOSIS — F29 Unspecified psychosis not due to a substance or known physiological condition: Secondary | ICD-10-CM | POA: Diagnosis not present

## 2018-09-05 DIAGNOSIS — R6889 Other general symptoms and signs: Secondary | ICD-10-CM | POA: Diagnosis not present

## 2018-09-05 DIAGNOSIS — I10 Essential (primary) hypertension: Secondary | ICD-10-CM | POA: Diagnosis not present

## 2018-09-06 DIAGNOSIS — E669 Obesity, unspecified: Secondary | ICD-10-CM | POA: Diagnosis not present

## 2018-09-06 DIAGNOSIS — E119 Type 2 diabetes mellitus without complications: Secondary | ICD-10-CM | POA: Diagnosis not present

## 2018-09-06 DIAGNOSIS — N189 Chronic kidney disease, unspecified: Secondary | ICD-10-CM | POA: Diagnosis not present

## 2018-09-06 DIAGNOSIS — I10 Essential (primary) hypertension: Secondary | ICD-10-CM | POA: Diagnosis not present

## 2018-09-06 DIAGNOSIS — F29 Unspecified psychosis not due to a substance or known physiological condition: Secondary | ICD-10-CM | POA: Diagnosis not present

## 2018-09-06 DIAGNOSIS — R000 Tachycardia, unspecified: Secondary | ICD-10-CM | POA: Diagnosis not present

## 2018-09-06 DIAGNOSIS — F250 Schizoaffective disorder, bipolar type: Secondary | ICD-10-CM | POA: Diagnosis not present

## 2018-09-06 DIAGNOSIS — E785 Hyperlipidemia, unspecified: Secondary | ICD-10-CM | POA: Diagnosis not present

## 2018-09-06 DIAGNOSIS — Z9114 Patient's other noncompliance with medication regimen: Secondary | ICD-10-CM | POA: Diagnosis not present

## 2018-09-07 ENCOUNTER — Other Ambulatory Visit: Payer: Self-pay | Admitting: Family Medicine

## 2018-09-11 ENCOUNTER — Ambulatory Visit (HOSPITAL_COMMUNITY): Payer: Medicare HMO | Admitting: Psychiatry

## 2018-09-15 DIAGNOSIS — F250 Schizoaffective disorder, bipolar type: Secondary | ICD-10-CM | POA: Diagnosis not present

## 2018-09-20 ENCOUNTER — Ambulatory Visit: Payer: Medicare HMO | Admitting: Podiatry

## 2018-10-10 ENCOUNTER — Other Ambulatory Visit: Payer: Self-pay | Admitting: Family Medicine

## 2018-10-13 DIAGNOSIS — Z1211 Encounter for screening for malignant neoplasm of colon: Secondary | ICD-10-CM | POA: Diagnosis not present

## 2018-10-13 DIAGNOSIS — I10 Essential (primary) hypertension: Secondary | ICD-10-CM | POA: Diagnosis not present

## 2018-10-13 DIAGNOSIS — Z1212 Encounter for screening for malignant neoplasm of rectum: Secondary | ICD-10-CM | POA: Diagnosis not present

## 2018-10-13 DIAGNOSIS — Z125 Encounter for screening for malignant neoplasm of prostate: Secondary | ICD-10-CM | POA: Diagnosis not present

## 2018-10-13 DIAGNOSIS — F2089 Other schizophrenia: Secondary | ICD-10-CM | POA: Diagnosis not present

## 2018-10-13 DIAGNOSIS — E782 Mixed hyperlipidemia: Secondary | ICD-10-CM | POA: Diagnosis not present

## 2018-10-13 DIAGNOSIS — E119 Type 2 diabetes mellitus without complications: Secondary | ICD-10-CM | POA: Diagnosis not present

## 2018-10-14 ENCOUNTER — Other Ambulatory Visit: Payer: Self-pay | Admitting: Family Medicine

## 2018-10-16 DIAGNOSIS — F250 Schizoaffective disorder, bipolar type: Secondary | ICD-10-CM | POA: Diagnosis not present

## 2018-10-21 ENCOUNTER — Other Ambulatory Visit: Payer: Self-pay | Admitting: Family Medicine

## 2018-11-10 DIAGNOSIS — R7989 Other specified abnormal findings of blood chemistry: Secondary | ICD-10-CM | POA: Diagnosis not present

## 2018-11-10 DIAGNOSIS — I10 Essential (primary) hypertension: Secondary | ICD-10-CM | POA: Diagnosis not present

## 2018-11-10 DIAGNOSIS — E119 Type 2 diabetes mellitus without complications: Secondary | ICD-10-CM | POA: Diagnosis not present

## 2018-11-10 DIAGNOSIS — E782 Mixed hyperlipidemia: Secondary | ICD-10-CM | POA: Diagnosis not present

## 2018-11-14 DIAGNOSIS — F250 Schizoaffective disorder, bipolar type: Secondary | ICD-10-CM | POA: Diagnosis not present

## 2018-11-18 DIAGNOSIS — F250 Schizoaffective disorder, bipolar type: Secondary | ICD-10-CM | POA: Diagnosis not present

## 2018-12-11 DIAGNOSIS — F250 Schizoaffective disorder, bipolar type: Secondary | ICD-10-CM | POA: Diagnosis not present

## 2019-01-08 ENCOUNTER — Encounter: Payer: Medicare HMO | Admitting: Family Medicine

## 2019-01-31 DIAGNOSIS — F250 Schizoaffective disorder, bipolar type: Secondary | ICD-10-CM | POA: Diagnosis not present

## 2019-02-19 DIAGNOSIS — R634 Abnormal weight loss: Secondary | ICD-10-CM | POA: Diagnosis not present

## 2019-02-19 DIAGNOSIS — Z593 Problems related to living in residential institution: Secondary | ICD-10-CM | POA: Diagnosis not present

## 2019-02-19 DIAGNOSIS — E782 Mixed hyperlipidemia: Secondary | ICD-10-CM | POA: Diagnosis not present

## 2019-02-19 DIAGNOSIS — R7989 Other specified abnormal findings of blood chemistry: Secondary | ICD-10-CM | POA: Diagnosis not present

## 2019-02-19 DIAGNOSIS — F2089 Other schizophrenia: Secondary | ICD-10-CM | POA: Diagnosis not present

## 2019-02-19 DIAGNOSIS — Z1389 Encounter for screening for other disorder: Secondary | ICD-10-CM | POA: Diagnosis not present

## 2019-02-19 DIAGNOSIS — Z1212 Encounter for screening for malignant neoplasm of rectum: Secondary | ICD-10-CM | POA: Diagnosis not present

## 2019-02-19 DIAGNOSIS — Z1211 Encounter for screening for malignant neoplasm of colon: Secondary | ICD-10-CM | POA: Diagnosis not present

## 2019-03-29 DIAGNOSIS — F250 Schizoaffective disorder, bipolar type: Secondary | ICD-10-CM | POA: Diagnosis not present

## 2019-04-05 ENCOUNTER — Telehealth: Payer: Self-pay | Admitting: Family Medicine

## 2019-04-05 NOTE — Telephone Encounter (Signed)
Called patient regarding AWV. Patient is in a facility (phone number [phone]). Spoke with manager of facility, he stated patient has a new provider. Cancelled AWV appointment.

## 2019-04-10 ENCOUNTER — Ambulatory Visit: Payer: Self-pay | Admitting: *Deleted

## 2019-05-21 DIAGNOSIS — E119 Type 2 diabetes mellitus without complications: Secondary | ICD-10-CM | POA: Diagnosis not present

## 2019-05-21 DIAGNOSIS — Z135 Encounter for screening for eye and ear disorders: Secondary | ICD-10-CM | POA: Diagnosis not present

## 2019-05-21 DIAGNOSIS — I10 Essential (primary) hypertension: Secondary | ICD-10-CM | POA: Diagnosis not present

## 2019-05-21 DIAGNOSIS — F2089 Other schizophrenia: Secondary | ICD-10-CM | POA: Diagnosis not present

## 2019-05-24 DIAGNOSIS — F250 Schizoaffective disorder, bipolar type: Secondary | ICD-10-CM | POA: Diagnosis not present

## 2019-05-31 DIAGNOSIS — Z1159 Encounter for screening for other viral diseases: Secondary | ICD-10-CM | POA: Diagnosis not present

## 2019-06-04 DIAGNOSIS — D123 Benign neoplasm of transverse colon: Secondary | ICD-10-CM | POA: Diagnosis not present

## 2019-06-04 DIAGNOSIS — E11638 Type 2 diabetes mellitus with other oral complications: Secondary | ICD-10-CM | POA: Diagnosis not present

## 2019-06-04 DIAGNOSIS — I10 Essential (primary) hypertension: Secondary | ICD-10-CM | POA: Diagnosis not present

## 2019-06-04 DIAGNOSIS — Z1211 Encounter for screening for malignant neoplasm of colon: Secondary | ICD-10-CM | POA: Diagnosis not present

## 2019-06-04 DIAGNOSIS — D122 Benign neoplasm of ascending colon: Secondary | ICD-10-CM | POA: Diagnosis not present

## 2019-06-04 DIAGNOSIS — K635 Polyp of colon: Secondary | ICD-10-CM | POA: Diagnosis not present

## 2019-07-23 DIAGNOSIS — F250 Schizoaffective disorder, bipolar type: Secondary | ICD-10-CM | POA: Diagnosis not present

## 2019-08-29 DIAGNOSIS — I10 Essential (primary) hypertension: Secondary | ICD-10-CM | POA: Diagnosis not present

## 2019-08-29 DIAGNOSIS — E782 Mixed hyperlipidemia: Secondary | ICD-10-CM | POA: Diagnosis not present

## 2019-08-29 DIAGNOSIS — Z79899 Other long term (current) drug therapy: Secondary | ICD-10-CM | POA: Diagnosis not present

## 2019-08-29 DIAGNOSIS — F2089 Other schizophrenia: Secondary | ICD-10-CM | POA: Diagnosis not present

## 2019-08-29 DIAGNOSIS — E119 Type 2 diabetes mellitus without complications: Secondary | ICD-10-CM | POA: Diagnosis not present

## 2019-09-19 DIAGNOSIS — F250 Schizoaffective disorder, bipolar type: Secondary | ICD-10-CM | POA: Diagnosis not present

## 2019-11-08 DIAGNOSIS — F250 Schizoaffective disorder, bipolar type: Secondary | ICD-10-CM | POA: Diagnosis not present

## 2020-01-08 DIAGNOSIS — F250 Schizoaffective disorder, bipolar type: Secondary | ICD-10-CM | POA: Diagnosis not present

## 2020-02-27 DIAGNOSIS — F250 Schizoaffective disorder, bipolar type: Secondary | ICD-10-CM | POA: Diagnosis not present

## 2020-03-09 DIAGNOSIS — R4182 Altered mental status, unspecified: Secondary | ICD-10-CM | POA: Diagnosis not present

## 2020-03-09 DIAGNOSIS — R404 Transient alteration of awareness: Secondary | ICD-10-CM | POA: Diagnosis not present

## 2020-03-09 DIAGNOSIS — R000 Tachycardia, unspecified: Secondary | ICD-10-CM | POA: Diagnosis not present

## 2020-03-09 DIAGNOSIS — Z20822 Contact with and (suspected) exposure to covid-19: Secondary | ICD-10-CM | POA: Diagnosis not present

## 2020-03-09 DIAGNOSIS — F29 Unspecified psychosis not due to a substance or known physiological condition: Secondary | ICD-10-CM | POA: Diagnosis not present

## 2020-03-10 DIAGNOSIS — Z20822 Contact with and (suspected) exposure to covid-19: Secondary | ICD-10-CM | POA: Diagnosis not present

## 2020-03-10 DIAGNOSIS — Z79899 Other long term (current) drug therapy: Secondary | ICD-10-CM | POA: Diagnosis not present

## 2020-03-10 DIAGNOSIS — F29 Unspecified psychosis not due to a substance or known physiological condition: Secondary | ICD-10-CM | POA: Diagnosis not present

## 2020-03-10 DIAGNOSIS — F209 Schizophrenia, unspecified: Secondary | ICD-10-CM | POA: Diagnosis not present

## 2020-03-10 DIAGNOSIS — E785 Hyperlipidemia, unspecified: Secondary | ICD-10-CM | POA: Diagnosis not present

## 2020-03-10 DIAGNOSIS — Z7982 Long term (current) use of aspirin: Secondary | ICD-10-CM | POA: Diagnosis not present

## 2020-03-10 DIAGNOSIS — R4182 Altered mental status, unspecified: Secondary | ICD-10-CM | POA: Diagnosis not present

## 2020-03-10 DIAGNOSIS — Z7984 Long term (current) use of oral hypoglycemic drugs: Secondary | ICD-10-CM | POA: Diagnosis not present

## 2020-03-10 DIAGNOSIS — E119 Type 2 diabetes mellitus without complications: Secondary | ICD-10-CM | POA: Diagnosis not present

## 2020-03-10 DIAGNOSIS — I10 Essential (primary) hypertension: Secondary | ICD-10-CM | POA: Diagnosis not present

## 2020-03-20 DIAGNOSIS — I10 Essential (primary) hypertension: Secondary | ICD-10-CM | POA: Diagnosis not present

## 2020-03-20 DIAGNOSIS — E119 Type 2 diabetes mellitus without complications: Secondary | ICD-10-CM | POA: Diagnosis not present

## 2020-03-20 DIAGNOSIS — G4700 Insomnia, unspecified: Secondary | ICD-10-CM | POA: Diagnosis not present

## 2020-03-20 DIAGNOSIS — F209 Schizophrenia, unspecified: Secondary | ICD-10-CM | POA: Diagnosis not present

## 2020-04-01 DIAGNOSIS — N400 Enlarged prostate without lower urinary tract symptoms: Secondary | ICD-10-CM | POA: Diagnosis not present

## 2020-04-01 DIAGNOSIS — I10 Essential (primary) hypertension: Secondary | ICD-10-CM | POA: Diagnosis not present

## 2020-04-01 DIAGNOSIS — E559 Vitamin D deficiency, unspecified: Secondary | ICD-10-CM | POA: Diagnosis not present

## 2020-04-01 DIAGNOSIS — E539 Vitamin B deficiency, unspecified: Secondary | ICD-10-CM | POA: Diagnosis not present

## 2020-04-01 DIAGNOSIS — E119 Type 2 diabetes mellitus without complications: Secondary | ICD-10-CM | POA: Diagnosis not present

## 2020-04-01 DIAGNOSIS — Z79899 Other long term (current) drug therapy: Secondary | ICD-10-CM | POA: Diagnosis not present

## 2020-04-01 DIAGNOSIS — E785 Hyperlipidemia, unspecified: Secondary | ICD-10-CM | POA: Diagnosis not present

## 2020-04-13 DIAGNOSIS — I10 Essential (primary) hypertension: Secondary | ICD-10-CM | POA: Diagnosis not present

## 2020-04-15 DIAGNOSIS — E119 Type 2 diabetes mellitus without complications: Secondary | ICD-10-CM | POA: Diagnosis not present

## 2020-04-15 DIAGNOSIS — N183 Chronic kidney disease, stage 3 unspecified: Secondary | ICD-10-CM | POA: Diagnosis not present

## 2020-04-15 DIAGNOSIS — I10 Essential (primary) hypertension: Secondary | ICD-10-CM | POA: Diagnosis not present

## 2020-04-15 DIAGNOSIS — E785 Hyperlipidemia, unspecified: Secondary | ICD-10-CM | POA: Diagnosis not present

## 2020-05-14 DIAGNOSIS — F319 Bipolar disorder, unspecified: Secondary | ICD-10-CM | POA: Diagnosis not present

## 2020-05-14 DIAGNOSIS — G4700 Insomnia, unspecified: Secondary | ICD-10-CM | POA: Diagnosis not present

## 2020-05-14 DIAGNOSIS — I10 Essential (primary) hypertension: Secondary | ICD-10-CM | POA: Diagnosis not present

## 2020-05-14 DIAGNOSIS — E119 Type 2 diabetes mellitus without complications: Secondary | ICD-10-CM | POA: Diagnosis not present

## 2020-06-11 DIAGNOSIS — I10 Essential (primary) hypertension: Secondary | ICD-10-CM | POA: Diagnosis not present

## 2020-06-11 DIAGNOSIS — E119 Type 2 diabetes mellitus without complications: Secondary | ICD-10-CM | POA: Diagnosis not present

## 2020-06-11 DIAGNOSIS — F319 Bipolar disorder, unspecified: Secondary | ICD-10-CM | POA: Diagnosis not present

## 2020-06-11 DIAGNOSIS — G4700 Insomnia, unspecified: Secondary | ICD-10-CM | POA: Diagnosis not present

## 2020-07-09 DIAGNOSIS — I10 Essential (primary) hypertension: Secondary | ICD-10-CM | POA: Diagnosis not present

## 2020-07-09 DIAGNOSIS — E119 Type 2 diabetes mellitus without complications: Secondary | ICD-10-CM | POA: Diagnosis not present

## 2020-07-09 DIAGNOSIS — F209 Schizophrenia, unspecified: Secondary | ICD-10-CM | POA: Diagnosis not present

## 2020-07-09 DIAGNOSIS — G4700 Insomnia, unspecified: Secondary | ICD-10-CM | POA: Diagnosis not present

## 2020-08-06 DIAGNOSIS — E119 Type 2 diabetes mellitus without complications: Secondary | ICD-10-CM | POA: Diagnosis not present

## 2020-08-06 DIAGNOSIS — I10 Essential (primary) hypertension: Secondary | ICD-10-CM | POA: Diagnosis not present

## 2020-08-06 DIAGNOSIS — G4700 Insomnia, unspecified: Secondary | ICD-10-CM | POA: Diagnosis not present

## 2020-08-06 DIAGNOSIS — J019 Acute sinusitis, unspecified: Secondary | ICD-10-CM | POA: Diagnosis not present

## 2020-09-03 DIAGNOSIS — G4700 Insomnia, unspecified: Secondary | ICD-10-CM | POA: Diagnosis not present

## 2020-09-03 DIAGNOSIS — I10 Essential (primary) hypertension: Secondary | ICD-10-CM | POA: Diagnosis not present

## 2020-09-03 DIAGNOSIS — F209 Schizophrenia, unspecified: Secondary | ICD-10-CM | POA: Diagnosis not present

## 2020-09-03 DIAGNOSIS — R059 Cough, unspecified: Secondary | ICD-10-CM | POA: Diagnosis not present

## 2020-09-17 DIAGNOSIS — R059 Cough, unspecified: Secondary | ICD-10-CM | POA: Diagnosis not present

## 2020-10-01 DIAGNOSIS — F319 Bipolar disorder, unspecified: Secondary | ICD-10-CM | POA: Diagnosis not present

## 2020-10-01 DIAGNOSIS — I10 Essential (primary) hypertension: Secondary | ICD-10-CM | POA: Diagnosis not present

## 2020-10-01 DIAGNOSIS — R059 Cough, unspecified: Secondary | ICD-10-CM | POA: Diagnosis not present

## 2020-10-01 DIAGNOSIS — G4700 Insomnia, unspecified: Secondary | ICD-10-CM | POA: Diagnosis not present
